# Patient Record
Sex: Male | Born: 1940 | Race: White | Hispanic: No | Marital: Married | Smoking: Former smoker
Health system: Southern US, Community
[De-identification: ages and names within clinical notes are randomized; demographics above are authoritative.]

## PROBLEM LIST (undated history)

## (undated) DIAGNOSIS — S46819A Strain of other muscles, fascia and tendons at shoulder and upper arm level, unspecified arm, initial encounter: Secondary | ICD-10-CM

## (undated) DIAGNOSIS — I10 Essential (primary) hypertension: Secondary | ICD-10-CM

## (undated) DIAGNOSIS — I484 Atypical atrial flutter: Secondary | ICD-10-CM

## (undated) DIAGNOSIS — G479 Sleep disorder, unspecified: Secondary | ICD-10-CM

## (undated) DIAGNOSIS — M5440 Lumbago with sciatica, unspecified side: Secondary | ICD-10-CM

## (undated) DIAGNOSIS — I493 Ventricular premature depolarization: Secondary | ICD-10-CM

## (undated) DIAGNOSIS — K219 Gastro-esophageal reflux disease without esophagitis: Secondary | ICD-10-CM

## (undated) DIAGNOSIS — F32A Depression, unspecified: Secondary | ICD-10-CM

## (undated) DIAGNOSIS — I214 Non-ST elevation (NSTEMI) myocardial infarction: Secondary | ICD-10-CM

## (undated) DIAGNOSIS — R195 Other fecal abnormalities: Secondary | ICD-10-CM

## (undated) DIAGNOSIS — D122 Benign neoplasm of ascending colon: Secondary | ICD-10-CM

## (undated) DIAGNOSIS — E785 Hyperlipidemia, unspecified: Secondary | ICD-10-CM

## (undated) DIAGNOSIS — K22719 Barrett's esophagus with dysplasia, unspecified: Secondary | ICD-10-CM

## (undated) DIAGNOSIS — I2510 Atherosclerotic heart disease of native coronary artery without angina pectoris: Secondary | ICD-10-CM

## (undated) DIAGNOSIS — R0600 Dyspnea, unspecified: Secondary | ICD-10-CM

## (undated) DIAGNOSIS — M5416 Radiculopathy, lumbar region: Secondary | ICD-10-CM

## (undated) DIAGNOSIS — F419 Anxiety disorder, unspecified: Secondary | ICD-10-CM

## (undated) DIAGNOSIS — F329 Major depressive disorder, single episode, unspecified: Secondary | ICD-10-CM

## (undated) DIAGNOSIS — M1990 Unspecified osteoarthritis, unspecified site: Secondary | ICD-10-CM

## (undated) DIAGNOSIS — K921 Melena: Secondary | ICD-10-CM

## (undated) DIAGNOSIS — E039 Hypothyroidism, unspecified: Secondary | ICD-10-CM

## (undated) DIAGNOSIS — I259 Chronic ischemic heart disease, unspecified: Secondary | ICD-10-CM

## (undated) DIAGNOSIS — R072 Precordial pain: Secondary | ICD-10-CM

## (undated) DIAGNOSIS — R0683 Snoring: Secondary | ICD-10-CM

## (undated) DIAGNOSIS — S68114A Complete traumatic metacarpophalangeal amputation of right ring finger, initial encounter: Secondary | ICD-10-CM

## (undated) DIAGNOSIS — I219 Acute myocardial infarction, unspecified: Secondary | ICD-10-CM

## (undated) DIAGNOSIS — I471 Supraventricular tachycardia: Secondary | ICD-10-CM

## (undated) DIAGNOSIS — R001 Bradycardia, unspecified: Secondary | ICD-10-CM

## (undated) DIAGNOSIS — G8929 Other chronic pain: Secondary | ICD-10-CM

## (undated) DIAGNOSIS — I4510 Unspecified right bundle-branch block: Secondary | ICD-10-CM

## (undated) DIAGNOSIS — N400 Enlarged prostate without lower urinary tract symptoms: Secondary | ICD-10-CM

## (undated) DIAGNOSIS — M79672 Pain in left foot: Secondary | ICD-10-CM

## (undated) DIAGNOSIS — R208 Other disturbances of skin sensation: Secondary | ICD-10-CM

## (undated) DIAGNOSIS — D62 Acute posthemorrhagic anemia: Secondary | ICD-10-CM

## (undated) DIAGNOSIS — K5521 Angiodysplasia of colon with hemorrhage: Secondary | ICD-10-CM

## (undated) DIAGNOSIS — M79671 Pain in right foot: Secondary | ICD-10-CM

## (undated) HISTORY — DX: Pain in left foot: M79.672

## (undated) HISTORY — PX: SHOULDER OPEN ROTATOR CUFF REPAIR: SHX2407

## (undated) HISTORY — DX: Sleep disorder, unspecified: G47.9

## (undated) HISTORY — DX: Other fecal abnormalities: R19.5

## (undated) HISTORY — DX: Angiodysplasia of colon with hemorrhage: K55.21

## (undated) HISTORY — DX: Essential (primary) hypertension: I10

## (undated) HISTORY — DX: Non-ST elevation (NSTEMI) myocardial infarction: I21.4

## (undated) HISTORY — DX: Radiculopathy, lumbar region: M54.16

## (undated) HISTORY — DX: Supraventricular tachycardia: I47.1

## (undated) HISTORY — DX: Atypical atrial flutter: I48.4

## (undated) HISTORY — DX: Chronic ischemic heart disease, unspecified: I25.9

## (undated) HISTORY — PX: EYE SURGERY: SHX253

## (undated) HISTORY — DX: Atherosclerotic heart disease of native coronary artery without angina pectoris: I25.10

## (undated) HISTORY — DX: Snoring: R06.83

## (undated) HISTORY — PX: FRACTURE SURGERY: SHX138

## (undated) HISTORY — DX: Lumbago with sciatica, unspecified side: M54.40

## (undated) HISTORY — DX: Unspecified right bundle-branch block: I45.10

## (undated) HISTORY — DX: Strain of other muscles, fascia and tendons at shoulder and upper arm level, unspecified arm, initial encounter: S46.819A

## (undated) HISTORY — DX: Precordial pain: R07.2

## (undated) HISTORY — DX: Other disturbances of skin sensation: R20.8

## (undated) HISTORY — DX: Barrett's esophagus with dysplasia, unspecified: K22.719

## (undated) HISTORY — DX: Benign neoplasm of ascending colon: D12.2

## (undated) HISTORY — PX: CORONARY ANGIOPLASTY: SHX604

## (undated) HISTORY — DX: Ventricular premature depolarization: I49.3

## (undated) HISTORY — DX: Complete traumatic metacarpophalangeal amputation of right ring finger, initial encounter: S68.114A

## (undated) HISTORY — DX: Pain in right foot: M79.671

## (undated) HISTORY — PX: OTHER SURGICAL HISTORY: SHX169

## (undated) HISTORY — DX: Gastro-esophageal reflux disease without esophagitis: K21.9

## (undated) HISTORY — DX: Bradycardia, unspecified: R00.1

## (undated) HISTORY — DX: Melena: K92.1

## (undated) HISTORY — DX: Other chronic pain: G89.29

## (undated) HISTORY — DX: Acute posthemorrhagic anemia: D62

---

## 1998-04-22 HISTORY — PX: KNEE ARTHROSCOPY W/ MENISCAL REPAIR: SHX1877

## 2001-04-22 DIAGNOSIS — I2510 Atherosclerotic heart disease of native coronary artery without angina pectoris: Secondary | ICD-10-CM | POA: Insufficient documentation

## 2001-04-22 DIAGNOSIS — I219 Acute myocardial infarction, unspecified: Secondary | ICD-10-CM | POA: Insufficient documentation

## 2001-04-22 HISTORY — DX: Acute myocardial infarction, unspecified: I21.9

## 2001-04-22 HISTORY — DX: Atherosclerotic heart disease of native coronary artery without angina pectoris: I25.10

## 2008-03-31 ENCOUNTER — Encounter: Admission: RE | Admit: 2008-03-31 | Discharge: 2008-03-31 | Payer: Self-pay | Admitting: Orthopedic Surgery

## 2009-04-22 HISTORY — PX: LUMBAR EPIDURAL INJECTION: SHX1980

## 2010-04-22 DIAGNOSIS — N400 Enlarged prostate without lower urinary tract symptoms: Secondary | ICD-10-CM | POA: Insufficient documentation

## 2010-04-22 HISTORY — DX: Benign prostatic hyperplasia without lower urinary tract symptoms: N40.0

## 2010-10-21 HISTORY — PX: CARDIAC CATHETERIZATION: SHX172

## 2011-03-15 ENCOUNTER — Encounter (HOSPITAL_COMMUNITY): Payer: Self-pay | Admitting: Pharmacy Technician

## 2011-03-21 ENCOUNTER — Encounter (HOSPITAL_COMMUNITY): Payer: Self-pay

## 2011-03-21 ENCOUNTER — Encounter (HOSPITAL_COMMUNITY)
Admission: RE | Admit: 2011-03-21 | Discharge: 2011-03-21 | Disposition: A | Discharge status: Home / Self Care | Payer: Medicare Other | Source: Ambulatory Visit | Attending: Orthopedic Surgery | Admitting: Orthopedic Surgery

## 2011-03-21 HISTORY — DX: Acute myocardial infarction, unspecified: I21.9

## 2011-03-21 HISTORY — DX: Gastro-esophageal reflux disease without esophagitis: K21.9

## 2011-03-21 HISTORY — DX: Essential (primary) hypertension: I10

## 2011-03-21 HISTORY — DX: Unspecified osteoarthritis, unspecified site: M19.90

## 2011-03-21 HISTORY — DX: Hyperlipidemia, unspecified: E78.5

## 2011-03-21 HISTORY — DX: Benign prostatic hyperplasia without lower urinary tract symptoms: N40.0

## 2011-03-21 HISTORY — DX: Atherosclerotic heart disease of native coronary artery without angina pectoris: I25.10

## 2011-03-21 LAB — BASIC METABOLIC PANEL
CO2: 26 mEq/L (ref 19–32)
Calcium: 9.1 mg/dL (ref 8.4–10.5)
Chloride: 106 mEq/L (ref 96–112)
Creatinine, Ser: 0.81 mg/dL (ref 0.50–1.35)
Glucose, Bld: 105 mg/dL — ABNORMAL HIGH (ref 70–99)

## 2011-03-21 LAB — DIFFERENTIAL
Eosinophils Absolute: 0.2 10*3/uL (ref 0.0–0.7)
Eosinophils Relative: 3 % (ref 0–5)
Lymphs Abs: 1.4 10*3/uL (ref 0.7–4.0)
Monocytes Absolute: 0.8 10*3/uL (ref 0.1–1.0)

## 2011-03-21 LAB — CBC
HCT: 40.1 % (ref 39.0–52.0)
MCH: 31.2 pg (ref 26.0–34.0)
MCV: 92.6 fL (ref 78.0–100.0)
Platelets: 261 10*3/uL (ref 150–400)
RBC: 4.33 MIL/uL (ref 4.22–5.81)
RDW: 13.6 % (ref 11.5–15.5)

## 2011-03-21 LAB — PROTIME-INR: Prothrombin Time: 12.3 seconds (ref 11.6–15.2)

## 2011-03-21 NOTE — Pre-Procedure Instructions (Addendum)
[address] [place]  03/21/2011   Your procedure is scheduled on:  Friday, Dec 07  Report to [facility] at 1200 PM.  Call this number if you have problems the morning of surgery: [phone]   Remember:   Do not eat food:After Midnight.  May have clear liquids: up to 4 Hours before arrival.  Clear liquids include soda, tea, black coffee, apple or grape juice, broth.  Take these medicines the morning of surgery with A SIP OF WATER:Amlodipine, Nexium, Isosorbide, Metoprolol,Apresoline   Do not wear jewelry, make-up or nail polish.  Do not wear lotions, powders, or perfumes. You may wear deodorant.  Do not shave 48 hours prior to surgery.  Do not bring valuables to the hospital.  Contacts, dentures or bridgework may not be worn into surgery.  Leave suitcase in the car. After surgery it may be brought to your room.  For patients admitted to the hospital, checkout time is 11:00 AM the day of discharge.   Patients discharged the day of surgery will not be allowed to drive home.  Name and phone number of your driver:n/a Special Instructions: CHG Shower Use Special Wash: 1/2 bottle night before surgery and 1/2 bottle morning of surgery.   Please read over the following fact sheets that you were given: Pain Booklet, Coughing and Deep Breathing, MRSA Information and Surgical Site Infection Prevention

## 2011-03-21 NOTE — Progress Notes (Signed)
Records requested from Dr [facility] @ [facility], [facility] [phone])

## 2011-03-21 NOTE — H&P (Signed)
CC: Right shoulder pain HPI:   70 y/o male with worsening shoulder pain with known rotator cuff issues.   PMH:Htn, cardiac stents hyperlipidemia Family History: CAD Social: pcp in [place], no smoking or drinking works as a Visual merchandiser Meds:plavix, ramipril, lasix, metoprolol, nexium, potassium, amlodipine, sotalol, hydralazine, aspirin, isosorbide mononitate Allergies: norco ROS: Pain with rom of right shoulder  Vitals: PE: Alert and appropriate    In no acute distress Cranial 2-12 grossly intact Cervical spine full rom without pain Lungs: bilateral breath sounds with no wheeze rhonchi or rales Heart: regular rate and rhythm with no murmur Abd: nontender nondistended with active bowel sounds Ext: moderate pain with rom of  Right shoulder worrisome for subscap tear, neurovascularly intact distally. No pedal edema Skin: no rashes  A/P: suspected subscap tear of right shoulder Plan for shoulder arthroscopy and open subscap repair

## 2011-03-28 MED ORDER — CEFAZOLIN SODIUM-DEXTROSE 2-3 GM-% IV SOLR
2.0000 g | INTRAVENOUS | Status: DC
  Filled 2011-03-28: qty 50

## 2011-03-29 ENCOUNTER — Encounter (HOSPITAL_COMMUNITY)
Admission: RE | Disposition: A | Discharge status: Home / Self Care | Payer: Self-pay | Source: Ambulatory Visit | Attending: Orthopedic Surgery

## 2011-03-29 ENCOUNTER — Encounter (HOSPITAL_COMMUNITY): Payer: Self-pay | Admitting: Surgery

## 2011-03-29 ENCOUNTER — Encounter (HOSPITAL_COMMUNITY): Payer: Self-pay | Admitting: Anesthesiology

## 2011-03-29 ENCOUNTER — Observation Stay (HOSPITAL_COMMUNITY)
Admission: RE | Admit: 2011-03-29 | Discharge: 2011-03-31 | Disposition: A | Discharge status: Home / Self Care | Payer: Medicare Other | Source: Ambulatory Visit | Attending: Orthopedic Surgery | Admitting: Orthopedic Surgery

## 2011-03-29 ENCOUNTER — Ambulatory Visit (HOSPITAL_COMMUNITY): Payer: Medicare Other | Admitting: Anesthesiology

## 2011-03-29 DIAGNOSIS — S46819A Strain of other muscles, fascia and tendons at shoulder and upper arm level, unspecified arm, initial encounter: Secondary | ICD-10-CM

## 2011-03-29 DIAGNOSIS — M719 Bursopathy, unspecified: Principal | ICD-10-CM | POA: Insufficient documentation

## 2011-03-29 DIAGNOSIS — M67919 Unspecified disorder of synovium and tendon, unspecified shoulder: Principal | ICD-10-CM | POA: Insufficient documentation

## 2011-03-29 DIAGNOSIS — Z01812 Encounter for preprocedural laboratory examination: Secondary | ICD-10-CM | POA: Insufficient documentation

## 2011-03-29 DIAGNOSIS — Z01811 Encounter for preprocedural respiratory examination: Secondary | ICD-10-CM | POA: Insufficient documentation

## 2011-03-29 DIAGNOSIS — Z5333 Arthroscopic surgical procedure converted to open procedure: Secondary | ICD-10-CM | POA: Insufficient documentation

## 2011-03-29 HISTORY — DX: Strain of other muscles, fascia and tendons at shoulder and upper arm level, unspecified arm, initial encounter: S46.819A

## 2011-03-29 HISTORY — PX: SHOULDER ARTHROSCOPY: SHX128

## 2011-03-29 SURGERY — ARTHROSCOPY, SHOULDER
Anesthesia: General | Site: Shoulder | Laterality: Right | Wound class: Clean

## 2011-03-29 MED ORDER — ISOSORBIDE MONONITRATE ER 60 MG PO TB24
60.0000 mg | ORAL_TABLET | Freq: Every day | ORAL | Status: DC
  Administered 2011-03-30 – 2011-03-31 (×2): 60 mg via ORAL
  Filled 2011-03-29 (×2): qty 1

## 2011-03-29 MED ORDER — DUTASTERIDE 0.5 MG PO CAPS
0.5000 mg | ORAL_CAPSULE | Freq: Every day | ORAL | Status: DC
  Administered 2011-03-30 – 2011-03-31 (×2): 0.5 mg via ORAL
  Filled 2011-03-29 (×2): qty 1

## 2011-03-29 MED ORDER — HYDRALAZINE HCL 25 MG PO TABS
25.0000 mg | ORAL_TABLET | Freq: Two times a day (BID) | ORAL | Status: DC
  Administered 2011-03-30 – 2011-03-31 (×2): 25 mg via ORAL
  Filled 2011-03-29 (×5): qty 1

## 2011-03-29 MED ORDER — LORAZEPAM 2 MG/ML IJ SOLN: 1.0000 mg | Freq: Once | INTRAMUSCULAR | Status: DC | PRN

## 2011-03-29 MED ORDER — SODIUM CHLORIDE 0.9 % IV SOLN: INTRAVENOUS | Status: DC

## 2011-03-29 MED ORDER — LACTATED RINGERS IV SOLN
INTRAVENOUS | Status: DC | PRN
  Administered 2011-03-29 (×2): via INTRAVENOUS

## 2011-03-29 MED ORDER — BUPIVACAINE-EPINEPHRINE 0.25% -1:200000 IJ SOLN
INTRAMUSCULAR | Status: DC | PRN
  Administered 2011-03-29: 8 mL

## 2011-03-29 MED ORDER — CEFAZOLIN SODIUM-DEXTROSE 2-3 GM-% IV SOLR
2.0000 g | Freq: Four times a day (QID) | INTRAVENOUS | Status: AC
  Administered 2011-03-29 – 2011-03-30 (×3): 2 g via INTRAVENOUS
  Filled 2011-03-29 (×3): qty 50

## 2011-03-29 MED ORDER — METOCLOPRAMIDE HCL 5 MG/ML IJ SOLN
5.0000 mg | Freq: Three times a day (TID) | INTRAMUSCULAR | Status: DC | PRN
  Filled 2011-03-29: qty 2

## 2011-03-29 MED ORDER — HYDROMORPHONE HCL PF 1 MG/ML IJ SOLN
INTRAMUSCULAR | Status: AC
  Administered 2011-03-29: 1 mg via INTRAVENOUS
  Filled 2011-03-29: qty 1

## 2011-03-29 MED ORDER — FENTANYL CITRATE 0.05 MG/ML IJ SOLN
50.0000 ug | INTRAMUSCULAR | Status: DC | PRN
  Administered 2011-03-29: 100 ug via INTRAVENOUS

## 2011-03-29 MED ORDER — FLAX SEED OIL 1000 MG PO CAPS: 1.0000 | ORAL_CAPSULE | Freq: Every day | ORAL | Status: DC

## 2011-03-29 MED ORDER — FENTANYL CITRATE 0.05 MG/ML IJ SOLN
INTRAMUSCULAR | Status: DC | PRN
  Administered 2011-03-29: 50 ug via INTRAVENOUS

## 2011-03-29 MED ORDER — TAMSULOSIN HCL 0.4 MG PO CAPS
0.4000 mg | ORAL_CAPSULE | Freq: Every day | ORAL | Status: DC
  Administered 2011-03-30 – 2011-03-31 (×2): 0.4 mg via ORAL
  Filled 2011-03-29 (×3): qty 1

## 2011-03-29 MED ORDER — NEOSTIGMINE METHYLSULFATE 1 MG/ML IJ SOLN
INTRAMUSCULAR | Status: DC | PRN
  Administered 2011-03-29: 2 mg via INTRAVENOUS

## 2011-03-29 MED ORDER — MIDAZOLAM HCL 2 MG/2ML IJ SOLN
INTRAMUSCULAR | Status: AC
  Filled 2011-03-29: qty 2

## 2011-03-29 MED ORDER — PROPOFOL 10 MG/ML IV EMUL
INTRAVENOUS | Status: DC | PRN
  Administered 2011-03-29: 170 mg via INTRAVENOUS

## 2011-03-29 MED ORDER — METHOCARBAMOL 100 MG/ML IJ SOLN
500.0000 mg | Freq: Four times a day (QID) | INTRAVENOUS | Status: DC | PRN
  Filled 2011-03-29: qty 5

## 2011-03-29 MED ORDER — ONDANSETRON HCL 4 MG PO TABS: 4.0000 mg | ORAL_TABLET | Freq: Four times a day (QID) | ORAL | Status: DC | PRN

## 2011-03-29 MED ORDER — PHENOL 1.4 % MT LIQD
1.0000 | OROMUCOSAL | Status: DC | PRN
  Filled 2011-03-29: qty 177

## 2011-03-29 MED ORDER — AMLODIPINE BESYLATE 5 MG PO TABS
5.0000 mg | ORAL_TABLET | Freq: Two times a day (BID) | ORAL | Status: DC
  Administered 2011-03-29 – 2011-03-31 (×4): 5 mg via ORAL
  Filled 2011-03-29 (×5): qty 1

## 2011-03-29 MED ORDER — CHLORHEXIDINE GLUCONATE 4 % EX LIQD: 60.0000 mL | Freq: Once | CUTANEOUS | Status: DC

## 2011-03-29 MED ORDER — PROMETHAZINE HCL 25 MG/ML IJ SOLN: 6.2500 mg | INTRAMUSCULAR | Status: DC | PRN

## 2011-03-29 MED ORDER — SODIUM CHLORIDE 0.9 % IR SOLN
Status: DC | PRN
  Administered 2011-03-29: 1000 mL

## 2011-03-29 MED ORDER — PHENYLEPHRINE HCL 10 MG/ML IJ SOLN
INTRAMUSCULAR | Status: DC | PRN
  Administered 2011-03-29 (×3): 80 ug via INTRAVENOUS

## 2011-03-29 MED ORDER — HYDROMORPHONE HCL PF 1 MG/ML IJ SOLN
0.5000 mg | INTRAMUSCULAR | Status: DC | PRN
  Administered 2011-03-29 – 2011-03-30 (×4): 1 mg via INTRAVENOUS
  Filled 2011-03-29 (×4): qty 1

## 2011-03-29 MED ORDER — CLOPIDOGREL BISULFATE 75 MG PO TABS
75.0000 mg | ORAL_TABLET | Freq: Every day | ORAL | Status: DC
  Administered 2011-03-30 – 2011-03-31 (×2): 75 mg via ORAL
  Filled 2011-03-29 (×3): qty 1

## 2011-03-29 MED ORDER — ROSUVASTATIN CALCIUM 10 MG PO TABS
10.0000 mg | ORAL_TABLET | Freq: Every day | ORAL | Status: DC
  Administered 2011-03-29 – 2011-03-30 (×2): 10 mg via ORAL
  Filled 2011-03-29 (×3): qty 1

## 2011-03-29 MED ORDER — BUPIVACAINE-EPINEPHRINE PF 0.5-1:200000 % IJ SOLN
INTRAMUSCULAR | Status: DC | PRN
  Administered 2011-03-29: 25 mL

## 2011-03-29 MED ORDER — RAMIPRIL 5 MG PO CAPS
5.0000 mg | ORAL_CAPSULE | Freq: Every day | ORAL | Status: DC
  Administered 2011-03-30 – 2011-03-31 (×2): 5 mg via ORAL
  Filled 2011-03-29 (×2): qty 1

## 2011-03-29 MED ORDER — METOCLOPRAMIDE HCL 10 MG PO TABS: 5.0000 mg | ORAL_TABLET | Freq: Three times a day (TID) | ORAL | Status: DC | PRN

## 2011-03-29 MED ORDER — FIBER (GUAR GUM) PO CHEW: 1.0000 | CHEWABLE_TABLET | Freq: Every day | ORAL | Status: DC

## 2011-03-29 MED ORDER — POTASSIUM CHLORIDE CRYS ER 20 MEQ PO TBCR
20.0000 meq | EXTENDED_RELEASE_TABLET | Freq: Every day | ORAL | Status: DC
  Administered 2011-03-30 – 2011-03-31 (×2): 20 meq via ORAL
  Filled 2011-03-29 (×2): qty 1

## 2011-03-29 MED ORDER — HYDROMORPHONE HCL PF 1 MG/ML IJ SOLN
0.2500 mg | INTRAMUSCULAR | Status: DC | PRN
  Administered 2011-03-29 (×3): 0.5 mg via INTRAVENOUS

## 2011-03-29 MED ORDER — OMEGA-3-ACID ETHYL ESTERS 1 G PO CAPS
1.0000 g | ORAL_CAPSULE | Freq: Every day | ORAL | Status: DC
  Administered 2011-03-30 – 2011-03-31 (×2): 1 g via ORAL
  Filled 2011-03-29 (×2): qty 1

## 2011-03-29 MED ORDER — GLUCOSAMINE-CHONDROITIN PO TABS: 1.0000 | ORAL_TABLET | Freq: Every day | ORAL | Status: DC

## 2011-03-29 MED ORDER — ACETAMINOPHEN 325 MG PO TABS: 650.0000 mg | ORAL_TABLET | Freq: Four times a day (QID) | ORAL | Status: DC | PRN

## 2011-03-29 MED ORDER — ASPIRIN EC 81 MG PO TBEC
81.0000 mg | DELAYED_RELEASE_TABLET | Freq: Every day | ORAL | Status: DC
  Administered 2011-03-30 – 2011-03-31 (×2): 81 mg via ORAL
  Filled 2011-03-29 (×3): qty 1

## 2011-03-29 MED ORDER — LIDOCAINE HCL (CARDIAC) 20 MG/ML IV SOLN
INTRAVENOUS | Status: DC | PRN
  Administered 2011-03-29: 40 mg via INTRAVENOUS

## 2011-03-29 MED ORDER — PANTOPRAZOLE SODIUM 40 MG PO TBEC
40.0000 mg | DELAYED_RELEASE_TABLET | Freq: Every day | ORAL | Status: DC
  Administered 2011-03-30 – 2011-03-31 (×2): 40 mg via ORAL
  Filled 2011-03-29 (×2): qty 1

## 2011-03-29 MED ORDER — SOTALOL HCL 80 MG PO TABS
40.0000 mg | ORAL_TABLET | Freq: Two times a day (BID) | ORAL | Status: DC
  Administered 2011-03-29 – 2011-03-31 (×4): 40 mg via ORAL
  Filled 2011-03-29 (×5): qty 0.5

## 2011-03-29 MED ORDER — ONDANSETRON HCL 4 MG/2ML IJ SOLN
INTRAMUSCULAR | Status: DC | PRN
  Administered 2011-03-29: 4 mg via INTRAVENOUS

## 2011-03-29 MED ORDER — FUROSEMIDE 40 MG PO TABS
40.0000 mg | ORAL_TABLET | Freq: Every day | ORAL | Status: DC
  Administered 2011-03-31: 40 mg via ORAL
  Filled 2011-03-29 (×2): qty 1

## 2011-03-29 MED ORDER — METOPROLOL TARTRATE 50 MG PO TABS
50.0000 mg | ORAL_TABLET | Freq: Two times a day (BID) | ORAL | Status: DC
  Administered 2011-03-29 – 2011-03-31 (×4): 50 mg via ORAL
  Filled 2011-03-29 (×5): qty 1

## 2011-03-29 MED ORDER — ACETAMINOPHEN 650 MG RE SUPP: 650.0000 mg | Freq: Four times a day (QID) | RECTAL | Status: DC | PRN

## 2011-03-29 MED ORDER — ROCURONIUM BROMIDE 100 MG/10ML IV SOLN
INTRAVENOUS | Status: DC | PRN
  Administered 2011-03-29: 50 mg via INTRAVENOUS

## 2011-03-29 MED ORDER — EPHEDRINE SULFATE 50 MG/ML IJ SOLN
INTRAMUSCULAR | Status: DC | PRN
  Administered 2011-03-29 (×2): 10 mg via INTRAVENOUS

## 2011-03-29 MED ORDER — SODIUM CHLORIDE 0.9 % IV SOLN
INTRAVENOUS | Status: DC
  Administered 2011-03-29: 19:00:00 via INTRAVENOUS

## 2011-03-29 MED ORDER — DUTASTERIDE-TAMSULOSIN HCL 0.5-0.4 MG PO CAPS: 1.0000 | ORAL_CAPSULE | Freq: Every day | ORAL | Status: DC

## 2011-03-29 MED ORDER — METHOCARBAMOL 500 MG PO TABS
500.0000 mg | ORAL_TABLET | Freq: Four times a day (QID) | ORAL | Status: DC | PRN
  Administered 2011-03-29 – 2011-03-30 (×3): 500 mg via ORAL
  Filled 2011-03-29 (×3): qty 1

## 2011-03-29 MED ORDER — OXYCODONE-ACETAMINOPHEN 5-325 MG PO TABS
1.0000 | ORAL_TABLET | ORAL | Status: DC | PRN
  Administered 2011-03-30 (×4): 2 via ORAL
  Filled 2011-03-29 (×4): qty 2

## 2011-03-29 MED ORDER — MENTHOL 3 MG MT LOZG
1.0000 | LOZENGE | OROMUCOSAL | Status: DC | PRN
  Filled 2011-03-29: qty 9

## 2011-03-29 MED ORDER — VITAMIN E 45 MG (100 UNIT) PO CAPS
100.0000 [IU] | ORAL_CAPSULE | Freq: Every day | ORAL | Status: DC
Start: 2011-03-30 — End: 2011-03-31
  Administered 2011-03-30 – 2011-03-31 (×2): 100 [IU] via ORAL
  Filled 2011-03-29 (×2): qty 1

## 2011-03-29 MED ORDER — SODIUM CHLORIDE 0.9 % IV SOLN
10.0000 mg | INTRAVENOUS | Status: DC | PRN
  Administered 2011-03-29: 10 ug/min via INTRAVENOUS

## 2011-03-29 MED ORDER — ONDANSETRON HCL 4 MG/2ML IJ SOLN: 4.0000 mg | Freq: Four times a day (QID) | INTRAMUSCULAR | Status: DC | PRN

## 2011-03-29 MED ORDER — THERA M PLUS PO TABS
1.0000 | ORAL_TABLET | Freq: Every day | ORAL | Status: DC
  Administered 2011-03-30 – 2011-03-31 (×2): 1 via ORAL
  Filled 2011-03-29 (×2): qty 1

## 2011-03-29 MED ORDER — FENTANYL CITRATE 0.05 MG/ML IJ SOLN
INTRAMUSCULAR | Status: AC
  Filled 2011-03-29: qty 2

## 2011-03-29 MED ORDER — GLYCOPYRROLATE 0.2 MG/ML IJ SOLN
INTRAMUSCULAR | Status: DC | PRN
  Administered 2011-03-29: .4 mg via INTRAVENOUS

## 2011-03-29 MED ORDER — MIDAZOLAM HCL 2 MG/2ML IJ SOLN
1.0000 mg | INTRAMUSCULAR | Status: DC | PRN
  Administered 2011-03-29: 2 mg via INTRAVENOUS

## 2011-03-29 MED ORDER — LACTATED RINGERS IV SOLN
INTRAVENOUS | Status: DC
  Administered 2011-03-29: 12:00:00 via INTRAVENOUS

## 2011-03-29 SURGICAL SUPPLY — 45 items
3.5mm poplok suture anchor (Orthopedic Implant) ×2 IMPLANT
4.5mm poplok suture anchor ×2 IMPLANT
ANCHOR SUT CROSSFT 4.5 #2 (Anchor) ×4 IMPLANT
BLADE CUDA 4.2 (BLADE) IMPLANT
BLADE SURG 11 STRL SS (BLADE) ×2 IMPLANT
BUR OVAL 4.0 (BURR) ×2 IMPLANT
CLOTH BEACON ORANGE TIMEOUT ST (SAFETY) ×2 IMPLANT
COVER SURGICAL LIGHT HANDLE (MISCELLANEOUS) ×2 IMPLANT
DRAPE INCISE IOBAN 66X45 STRL (DRAPES) ×2 IMPLANT
DRAPE STERI 35X30 U-POUCH (DRAPES) ×2 IMPLANT
DRAPE U-SHAPE 47X51 STRL (DRAPES) ×2 IMPLANT
DRSG EMULSION OIL 3X3 NADH (GAUZE/BANDAGES/DRESSINGS) ×4 IMPLANT
DRSG PAD ABDOMINAL 8X10 ST (GAUZE/BANDAGES/DRESSINGS) ×4 IMPLANT
DURAPREP 26ML APPLICATOR (WOUND CARE) ×2 IMPLANT
ELECT REM PT RETURN 9FT ADLT (ELECTROSURGICAL)
ELECTRODE REM PT RTRN 9FT ADLT (ELECTROSURGICAL) IMPLANT
GLOVE BIOGEL PI ORTHO PRO 7.5 (GLOVE) ×1
GLOVE BIOGEL PI ORTHO PRO SZ8 (GLOVE) ×1
GLOVE ORTHO TXT STRL SZ7.5 (GLOVE) ×2 IMPLANT
GLOVE PI ORTHO PRO STRL 7.5 (GLOVE) ×1 IMPLANT
GLOVE PI ORTHO PRO STRL SZ8 (GLOVE) ×1 IMPLANT
GLOVE SURG ORTHO 8.5 STRL (GLOVE) ×2 IMPLANT
GOWN STRL NON-REIN LRG LVL3 (GOWN DISPOSABLE) ×4 IMPLANT
KIT BASIN OR (CUSTOM PROCEDURE TRAY) ×2 IMPLANT
KIT ROOM TURNOVER OR (KITS) ×2 IMPLANT
MANIFOLD NEPTUNE II (INSTRUMENTS) ×2 IMPLANT
NEEDLE HYPO 25GX1X1/2 BEV (NEEDLE) ×2 IMPLANT
NEEDLE SPNL 18GX3.5 QUINCKE PK (NEEDLE) ×2 IMPLANT
NS IRRIG 1000ML POUR BTL (IV SOLUTION) ×2 IMPLANT
PACK SHOULDER (CUSTOM PROCEDURE TRAY) ×2 IMPLANT
PAD ARMBOARD 7.5X6 YLW CONV (MISCELLANEOUS) ×4 IMPLANT
RESECTOR FULL RADIUS 4.2MM (BLADE) ×4 IMPLANT
SET ARTHROSCOPY TUBING (MISCELLANEOUS) ×1
SET ARTHROSCOPY TUBING LN (MISCELLANEOUS) ×1 IMPLANT
SLING ARM FOAM STRAP LRG (SOFTGOODS) ×2 IMPLANT
SLING ARM FOAM STRAP MED (SOFTGOODS) IMPLANT
SPONGE GAUZE 4X4 12PLY (GAUZE/BANDAGES/DRESSINGS) ×2 IMPLANT
STRIP CLOSURE SKIN 1/2X4 (GAUZE/BANDAGES/DRESSINGS) ×2 IMPLANT
SUT MNCRL AB 3-0 PS2 27 (SUTURE) ×2 IMPLANT
SYR CONTROL 10ML LL (SYRINGE) ×2 IMPLANT
TOWEL OR 17X24 6PK STRL BLUE (TOWEL DISPOSABLE) ×2 IMPLANT
TOWEL OR 17X26 10 PK STRL BLUE (TOWEL DISPOSABLE) ×2 IMPLANT
TUBE CONNECTING 12X1/4 (SUCTIONS) ×2 IMPLANT
WAND 90 DEG TURBOVAC W/CORD (SURGICAL WAND) ×2 IMPLANT
WATER STERILE IRR 1000ML POUR (IV SOLUTION) ×2 IMPLANT

## 2011-03-29 NOTE — Anesthesia Postprocedure Evaluation (Signed)
  Anesthesia Post-op Note  Patient: Andrew Hardy  Procedure(s) Performed:  ARTHROSCOPY SHOULDER - RIGHT SHOULDER ARTHROSCOPY WITH OPEN SUBSCAPULAR REPAIR  Patient Location: PACU  Anesthesia Type: GA combined with regional for post-op pain  Level of Consciousness: awake, alert  and oriented  Airway and Oxygen Therapy: Patient Spontanous Breathing  Post-op Pain: none  Post-op Assessment: Post-op Vital signs reviewed, Patient's Cardiovascular Status Stable, Respiratory Function Stable, Patent Airway and No signs of Nausea or vomiting  Post-op Vital Signs: stable  Complications: No apparent anesthesia complications

## 2011-03-29 NOTE — Interval H&P Note (Signed)
History and Physical Interval Note:  03/29/2011 12:44 PM  Andrew Hardy  has presented today for surgery, with the diagnosis of RIGHT SHOULDER SUBSCAPULAR TEAR  The various methods of treatment have been discussed with the patient and family. After consideration of risks, benefits and other options for treatment, the patient has consented to  Procedure(s): ARTHROSCOPY SHOULDER as a surgical intervention .  The patients' history has been reviewed, patient examined, no change in status, stable for surgery.  I have reviewed the patients' chart and labs.  Questions were answered to the patient's satisfaction.     Dashia Caldeira,STEVEN R

## 2011-03-29 NOTE — Anesthesia Preprocedure Evaluation (Signed)
Anesthesia Evaluation  Patient identified by MRN, date of birth, ID band Patient awake    Reviewed: Allergy & Precautions, H&P , NPO status , Patient's Chart, lab work & pertinent test results  Airway Mallampati: II TM Distance: >3 FB Neck ROM: Full    Dental   Pulmonary    Pulmonary exam normal       Cardiovascular hypertension, + CAD and + Past MI     Neuro/Psych    GI/Hepatic GERD-  ,  Endo/Other    Renal/GU      Musculoskeletal   Abdominal   Peds  Hematology   Anesthesia Other Findings   Reproductive/Obstetrics                           Anesthesia Physical Anesthesia Plan  ASA: III  Anesthesia Plan: General   Post-op Pain Management:    Induction: Intravenous  Airway Management Planned: Oral ETT  Additional Equipment:   Intra-op Plan:   Post-operative Plan: Extubation in OR  Informed Consent: I have reviewed the patients History and Physical, chart, labs and discussed the procedure including the risks, benefits and alternatives for the proposed anesthesia with the patient or authorized representative who has indicated his/her understanding and acceptance.     Plan Discussed with: CRNA and Surgeon  Anesthesia Plan Comments:         Anesthesia Quick Evaluation

## 2011-03-29 NOTE — Transfer of Care (Signed)
Immediate Anesthesia Transfer of Care Note  Patient: Andrew Hardy  Procedure(s) Performed:  ARTHROSCOPY SHOULDER - RIGHT SHOULDER ARTHROSCOPY WITH OPEN SUBSCAPULAR REPAIR  Patient Location: PACU  Anesthesia Type: General  Level of Consciousness: awake, alert  and oriented  Airway & Oxygen Therapy: Patient Spontanous Breathing and Patient connected to nasal cannula oxygen  Post-op Assessment: Report given to PACU RN and Post -op Vital signs reviewed and stable  Post vital signs: Reviewed and stable  Complications: No apparent anesthesia complications

## 2011-03-29 NOTE — Brief Op Note (Signed)
03/29/2011  2:52 PM  PATIENT:  Andrew Hardy  70 y.o. male  PRE-OPERATIVE DIAGNOSIS:  RIGHT SHOULDER SUBSCAPULAR TEAR   POST-OPERATIVE DIAGNOSIS:  right shoulder subscapular tear, chronic rotator cuff insufficiency  PROCEDURE:  Procedure(s): ARTHROSCOPY SHOULDER, extensive debridement, open subscap repair  SURGEON:  Surgeon(s): Verlee Rossetti  PHYSICIAN ASSISTANT:   ASSISTANTS: Thea Gist PA-C   ANESTHESIA:   general  EBL:  Total I/O In: 1000 [I.V.:1000] Out: -   BLOOD ADMINISTERED:none  DRAINS: none   LOCAL MEDICATIONS USED:  MARCAINE 10 CC  SPECIMEN:  No Specimen  DISPOSITION OF SPECIMEN:  N/A  COUNTS:  YES  TOURNIQUET:  * No tourniquets in log *  DICTATION: .Other Dictation: Dictation Number [id number]  PLAN OF CARE: Admit for overnight observation  PATIENT DISPOSITION:  PACU - hemodynamically stable.   Delay start of Pharmacological VTE agent (>24hrs) due to surgical blood loss or risk of bleeding:  {YES/NO/NOT APPLICABLE:20182

## 2011-03-29 NOTE — Anesthesia Procedure Notes (Signed)
Anesthesia Regional Block:  Interscalene brachial plexus block  Pre-Anesthetic Checklist: ,, timeout performed, Correct Patient, Correct Site, Correct Laterality, Correct Procedure, Correct Position, site marked, Risks and benefits discussed,  Surgical consent,  Pre-op evaluation,  At surgeon's request and post-op pain management  Laterality: Right  Prep: chloraprep       Needles:  Injection technique: Single-shot  Needle Type: Stimulator Needle - 40     Needle Length:cm 4 cm Needle Gauge: 22 and 22 G    Additional Needles:  Procedures: nerve stimulator Interscalene brachial plexus block  Nerve Stimulator or Paresthesia:  Response: 0.5 mA,   Additional Responses:   Narrative:  Start time: 03/29/2011 12:25 PM End time: 03/29/2011 12:38 PM Injection made incrementally with aspirations every 5 mL.  Performed by: Personally  Anesthesiologist: Gypsy Balsam  Additional Notes: Pop r isb Chg prep, sterile tech 22 stim needle down to .5ma No compl Dr Gypsy Balsam

## 2011-03-29 NOTE — Preoperative (Signed)
Beta Blockers   Reason not to administer Beta Blockers:Not Applicable 

## 2011-03-30 LAB — BASIC METABOLIC PANEL
CO2: 27 mEq/L (ref 19–32)
Calcium: 8.6 mg/dL (ref 8.4–10.5)
GFR calc Af Amer: 86 mL/min — ABNORMAL LOW (ref >90)
GFR calc non Af Amer: 74 mL/min — ABNORMAL LOW (ref >90)
Sodium: 137 mEq/L (ref 135–145)

## 2011-03-30 LAB — HEMOGLOBIN AND HEMATOCRIT, BLOOD: HCT: 38.4 % — ABNORMAL LOW (ref 39.0–52.0)

## 2011-03-30 MED ORDER — HYDROCORTISONE 1 % EX CREA
TOPICAL_CREAM | CUTANEOUS | Status: DC | PRN
  Filled 2011-03-30: qty 28

## 2011-03-30 MED ORDER — KETOROLAC TROMETHAMINE 15 MG/ML IJ SOLN
15.0000 mg | Freq: Three times a day (TID) | INTRAMUSCULAR | Status: DC
  Administered 2011-03-30 – 2011-03-31 (×3): 15 mg via INTRAVENOUS
  Filled 2011-03-30 (×6): qty 1

## 2011-03-30 MED ORDER — DIPHENHYDRAMINE HCL 25 MG PO CAPS
25.0000 mg | ORAL_CAPSULE | Freq: Four times a day (QID) | ORAL | Status: DC | PRN
  Administered 2011-03-30 (×2): 25 mg via ORAL
  Filled 2011-03-30 (×2): qty 1

## 2011-03-30 MED ORDER — HYDROMORPHONE HCL 2 MG PO TABS
2.0000 mg | ORAL_TABLET | ORAL | Status: DC | PRN
  Administered 2011-03-30 – 2011-03-31 (×3): 2 mg via ORAL
  Filled 2011-03-30 (×3): qty 1

## 2011-03-30 MED ORDER — DIPHENHYDRAMINE HCL 25 MG PO CAPS
50.0000 mg | ORAL_CAPSULE | Freq: Four times a day (QID) | ORAL | Status: DC | PRN
  Administered 2011-03-30 – 2011-03-31 (×2): 50 mg via ORAL
  Filled 2011-03-30 (×2): qty 2

## 2011-03-30 NOTE — Op Note (Signed)
NAMEMarland Kitchen  Andrew Hardy, Andrew Hardy                   ACCOUNT NO.:  [id number]  MEDICAL RECORD NO.:  [id number]  LOCATION:  5018                         FACILITY:  [facility]  PHYSICIAN:  Almedia Balls. Ranell Patrick, M.D. DATE OF BIRTH:  Nov 16, 1940  DATE OF PROCEDURE:  03/29/2011 DATE OF DISCHARGE:                              OPERATIVE REPORT   PREOPERATIVE DIAGNOSIS:  Right shoulder chronic rotator cuff insufficiency with acute on chronic subscapularis tear.  POSTOPERATIVE DIAGNOSIS:  Right shoulder chronic rotator cuff insufficiency with acute on chronic subscapularis tear.  PROCEDURE PERFORMED:  Right shoulder arthroscopy with extensive debridement and open subscapularis repair.  ATTENDING SURGEON:  Almedia Balls. Ranell Patrick, MD  ASSISTANT:  Donnie Coffin. Dixon, PA-C, who scrubbed for the entirety of the procedure and necessary for proper completion of surgery and adequate visualization.  ANESTHESIA:  General anesthesia plus interscalene block was used.  ESTIMATED BLOOD LOSS:  Less than 50 mL.  FLUID REPLACEMENT:  1500 mL of crystalloid.  INSTRUMENT COUNTS:  Correct.  There were no complications. Perioperative antibiotics were given.  INDICATIONS:  The patient is a 70 year old male with worsening right shoulder pain following a recent injury to his shoulder.  The patient has longstanding rotator cuff insufficiency.  He has been managed with periodic injections of corticosteroid.  The patient has done well until recently when he felt a pop in his shoulder with immediate onset of severe pain in his shoulder and difficulty elevating his arm.  The patient was felt to have sustained a subscapularis tear based on MRI findings and clinical findings.  The patient presents now having been fully counseled regarding surgical and nonsurgical options for the shoulder.  Risks and benefits were discussed.  The patient wanted to proceed with surgery to restore function and eliminate pain to his shoulder.  DESCRIPTION OF  PROCEDURE:  After adequate level of anesthesia was achieved, the patient was positioned in the modified beach-chair position.  His shoulder was examined under anesthesia.  He had crepitance with passive range of motion.  He had external rotation about 90 degrees, concerning for subscapularis insufficiency.  We then sterilely prepped and draped the right shoulder.  Time-out was called. We then entered the shoulder using standard portals including anterior, posterior, and lateral portals.  We identified extensive tearing of the rotator cuff.  Remaining labral remnant was also torn.  We performed debridement of the labrum using motorized shaver and basket forceps as well as some loose and unstable rotator cuff tissue that we debrided. The subscapularis was visualized right at the glenoid rim anteriorly. Once we completed our debridement of the shoulder, the articular cartilage looked to be in pretty good shape, just a little bit of chondromalacia grade 2, grade 3, small erosions.  We placed the scope in subacromial space.  Thorough bursectomy was performed.  We assessed the cuff from the dorsal surface and again nonrepairable, retracted beyond the level of the glenoid.  The supra and infraspinatus, teres minor seemed to be intact.  Subscapularis not visualized.  Once we cleaned that up in the subacromial space, we did not do a formal acromioplasty with a bur as we felt like we needed to preserve the CA  ligament to make sure we did not have any escape.  At this point, we concluded the arthroscopic portion of the surgery and made a deltopectoral incision from the coracoid process anteriorly down towards anterior humerus, was about a 5-cm incision.  Dissection down through subcutaneous tissues using Bovie, identified cephalic vein, taken laterally and deltoid and pectoralis taken medially.  We were able to place a deep retractor lateral to the conjoined tendon.  I was basically stare directly at  the humeral head.  There was really very little evidence of subscap.  I could see a little bit of inferior part that was still attached.  We tracked that back and grabbed what was remaining of the subscap which was just really be that tendon and with the arm in neutral, we could just barely get it out to the lesser tuberosity.  If I internally rotated, I could definitely get subscap out.  The tissue quality was extremely poor, but I felt that this would give him the only chance for decent function other than a reverse shoulder replacement which he has got a physical individual in [place], so we want to make sure that we try to give him every chance to have the subscap heal.  So, we took the #2 FiberWire suture and did modified Mason-Allen suture technique and two 4.5 ConMed bio-composite suture anchors at the articular margin, double loaded with #2 nonabsorbable suture that we brought up through the tendon in a mattress fashion.  We then took the mattress sutures and some Mason-Allen sutures and used PushLock out lateral to the lesser tuberosity, so we had a total of 4 anchors with an anatomic repair of the subscapularis.  It was not under tension as long as his shoulder was internally rotated.  If we brought the shoulder out to neutral, it became under tension and at this point, we thoroughly irrigated the subdeltoid, subpectoral interval and then closed the deltopectoral interval with 0 Vicryl, followed by 2-0 Vicryl subcutaneous closure, 4-0 Monocryl for skin and portals.  Steri-Strips applied followed by sterile dressing.  The patient tolerated the surgery well.     Almedia Balls. Ranell Patrick, M.D.     SRN/MEDQ  D:  03/29/2011  T:  03/30/2011  Job:  [id number]

## 2011-03-30 NOTE — Progress Notes (Signed)
Has had itching during the night and worse this am. Rx'd with Benedryl and cortisone cream. Having difficulty with pain control, will try Toradol.  Prob. Home tomorrow.

## 2011-03-30 NOTE — Progress Notes (Signed)
OT Evaluation complete and in shadow chart & to be scanned into EPIC. Pt presents s/p R shoulder arthroplasty & has no further acute OT needs secondary to: All education is completed; Caregiver is Mod I w/ ADL activity and Pt will have necessary assist by caregiver @ D/C. Will sign off acute OT at this time.

## 2011-03-31 MED ORDER — HYDROMORPHONE HCL 2 MG PO TABS: 2.0000 mg | ORAL_TABLET | ORAL | Status: AC | PRN

## 2011-03-31 MED ORDER — METHOCARBAMOL 500 MG PO TABS: 500.0000 mg | ORAL_TABLET | Freq: Four times a day (QID) | ORAL | Status: AC | PRN

## 2011-03-31 NOTE — Progress Notes (Signed)
Subjective:Much better today .  Objective: Vital signs in last 24 hours: Temp:  [98.7 F (37.1 C)-100 F (37.8 C)] 99.7 F (37.6 C) (12/09 0656) Pulse Rate:  [57-69] 57  (12/09 0656) Resp:  [16-18] 18  (12/09 0656) BP: (112-137)/(64-86) 137/86 mmHg (12/09 0656) SpO2:  [89 %-95 %] 90 % (12/09 0656)  Intake/Output from previous day: 12/08 0701 - 12/09 0700 In: 275 [P.O.:275] Out: -  Intake/Output this shift:     Basename 03/30/11 0500  HGB 12.2*    Basename 03/30/11 0500  WBC --  RBC --  HCT 38.4*  PLT --    Basename 03/30/11 0500  NA 137  K 4.0  CL 102  CO2 27  BUN 12  CREATININE 1.01  GLUCOSE 99  CALCIUM 8.6   No results found for this basename: LABPT:2,INR:2 in the last 72 hours  Neurovascular intact Sensation intact distally Intact pulses distally Dressing clean and dry no bleed thru. Assessment/Plan:POD 2 Rt RCTR improved Will D/C Home   Mat Stuard W 03/31/2011, 10:07 AM

## 2011-04-01 NOTE — Discharge Summary (Signed)
Physician Discharge Summary  Patient ID: Andrew Hardy MRN: [id number] DOB/AGE: 1941-03-02 70 y.o.  Admit date: 03/29/2011 Discharge date: 04/01/2011  Admission Diagnoses:  Principal Problem:  *Subscapularis (muscle) sprain   Discharge Diagnoses:  Same   Surgeries: Procedure(s): ARTHROSCOPY SHOULDER with open subscap repair   Consultants: Occupational Therapy  Discharged Condition: Improved  Hospital Course: Andrew Hardy is an 70 y.o. male who was admitted 03/29/2011 with a chief complaint of shoulder pain, and found to have a diagnosis of Subscapularis (muscle) sprain.  They were brought to the operating room on 03/29/2011 and underwent the above named procedures.  Post op the patient was transferred to the floor with CPAP for OSA. Occ therapy instructed on gentle Codman's exercises and AROM wrist and elbow.  The patient had a  hospital course complicated by severe itching and poor pain control POD 1.  Benadryl and Toradol were added and the patient was finally stable for D/C on POD #2.    Recent vital signs:  Filed Vitals:   03/31/11 0656  BP: 137/86  Pulse: 57  Temp: 99.7 F (37.6 C)  Resp: 18    Recent laboratory studies:  Results for orders placed during the hospital encounter of 03/29/11  HEMOGLOBIN AND HEMATOCRIT, BLOOD      Component Value Range   Hemoglobin 12.2 (*) 13.0 - 17.0 (g/dL)   HCT 40.9 (*) 81.1 - 52.0 (%)  BASIC METABOLIC PANEL      Component Value Range   Sodium 137  135 - 145 (mEq/L)   Potassium 4.0  3.5 - 5.1 (mEq/L)   Chloride 102  96 - 112 (mEq/L)   CO2 27  19 - 32 (mEq/L)   Glucose, Bld 99  70 - 99 (mg/dL)   BUN 12  6 - 23 (mg/dL)   Creatinine, Ser 9.14  0.50 - 1.35 (mg/dL)   Calcium 8.6  8.4 - 78.2 (mg/dL)   GFR calc non Af Amer 74 (*) >90 (mL/min)   GFR calc Af Amer 86 (*) >90 (mL/min)    Discharge Medications:   Discharge Medication List as of 03/31/2011 10:29 AM    START taking these medications   Details  HYDROmorphone (DILAUDID) 2  MG tablet Take 1 tablet (2 mg total) by mouth every 4 (four) hours as needed., Starting 03/31/2011, Until Wed 04/10/11, Print    methocarbamol (ROBAXIN) 500 MG tablet Take 1 tablet (500 mg total) by mouth every 6 (six) hours as needed., Starting 03/31/2011, Until Wed 04/10/11, Print      CONTINUE these medications which have NOT CHANGED   Details  amLODipine (NORVASC) 5 MG tablet Take 5 mg by mouth 2 (two) times daily.  , Until Discontinued, Historical Med    aspirin 81 MG tablet Take 81 mg by mouth daily.  , Until Discontinued, Historical Med    clopidogrel (PLAVIX) 75 MG tablet Take 75 mg by mouth daily.  , Until Discontinued, Historical Med    Dutasteride-Tamsulosin HCl (JALYN) 0.5-0.4 MG CAPS Take 1 capsule by mouth daily.  , Until Discontinued, Historical Med    esomeprazole (NEXIUM) 40 MG capsule Take 40 mg by mouth daily before breakfast.  , Until Discontinued, Historical Med    FIBER, GUAR GUM, PO Take 3 tablets by mouth daily.  , Until Discontinued, Historical Med    Flaxseed, Linseed, (FLAX SEED OIL PO) Take 1 capsule by mouth daily.  , Until Discontinued, Historical Med    furosemide (LASIX) 40 MG tablet Take 40 mg by mouth  daily. Takes 40mg  one day and 80mg  another day , Until Discontinued, Historical Med    hydrALAZINE (APRESOLINE) 25 MG tablet Take 25 mg by mouth 2 (two) times daily.  , Until Discontinued, Historical Med    isosorbide mononitrate (IMDUR) 60 MG 24 hr tablet Take 60 mg by mouth daily.  , Until Discontinued, Historical Med    metoprolol (LOPRESSOR) 50 MG tablet Take 50 mg by mouth 2 (two) times daily.  , Until Discontinued, Historical Med    Multiple Vitamins-Minerals (MULTIVITAMINS THER. W/MINERALS) TABS Take 1 tablet by mouth daily.  , Until Discontinued, Historical Med    Omega-3 Fatty Acids (FISH OIL PO) Take 1 capsule by mouth daily.  , Until Discontinued, Historical Med    potassium chloride SA (K-DUR,KLOR-CON) 20 MEQ tablet Take 20 mEq by mouth daily.   , Until Discontinued, Historical Med    ramipril (ALTACE) 5 MG tablet Take 5 mg by mouth daily.  , Until Discontinued, Historical Med    simvastatin (ZOCOR) 40 MG tablet Take 40 mg by mouth at bedtime.  , Until Discontinued, Historical Med    sotalol (BETAPACE) 80 MG tablet Take 40 mg by mouth 2 (two) times daily.  , Until Discontinued, Historical Med    VITAMIN E PO Take 1 capsule by mouth daily.  , Until Discontinued, Historical Med    Glucosamine-Chondroit-Vit C-Mn (GLUCOSAMINE-CHONDROITIN) TABS Take 1 tablet by mouth daily.  , Until Discontinued, Historical Med        Diagnostic Studies: No results found.  Disposition: Home or Self Care  Discharge Orders    Future Orders Please Complete By Expires   Diet general      Call MD / Call 911      Comments:   If you experience chest pain or shortness of breath, CALL 911 and be transported to the hospital emergency room.  If you develope a fever above 101 F, pus (white drainage) or increased drainage or redness at the wound, or calf pain, call your surgeon's office.   Increase activity slowly as tolerated      Discharge instructions      Comments:   Keep dressing dry. Remove dressing 12/12 and cover incisions with dressing, keep dry till see Dr Ranell Patrick.   Driving restrictions      Comments:   No driving till you see Dr Kerrie Buffalo constantly.  Do gentle exercises daily.     Signed: Ebony Rickel,STEVEN R 04/01/2011, 1:18 PM

## 2011-04-02 ENCOUNTER — Encounter (HOSPITAL_COMMUNITY): Payer: Self-pay | Admitting: Orthopedic Surgery

## 2011-04-30 DIAGNOSIS — E785 Hyperlipidemia, unspecified: Secondary | ICD-10-CM | POA: Diagnosis not present

## 2011-04-30 DIAGNOSIS — I2510 Atherosclerotic heart disease of native coronary artery without angina pectoris: Secondary | ICD-10-CM | POA: Diagnosis not present

## 2011-04-30 DIAGNOSIS — E538 Deficiency of other specified B group vitamins: Secondary | ICD-10-CM | POA: Diagnosis not present

## 2011-05-09 DIAGNOSIS — M25519 Pain in unspecified shoulder: Secondary | ICD-10-CM | POA: Diagnosis not present

## 2011-05-28 DIAGNOSIS — E538 Deficiency of other specified B group vitamins: Secondary | ICD-10-CM | POA: Diagnosis not present

## 2011-06-10 DIAGNOSIS — H01009 Unspecified blepharitis unspecified eye, unspecified eyelid: Secondary | ICD-10-CM | POA: Diagnosis not present

## 2011-06-19 DIAGNOSIS — S46819A Strain of other muscles, fascia and tendons at shoulder and upper arm level, unspecified arm, initial encounter: Secondary | ICD-10-CM | POA: Diagnosis not present

## 2011-07-03 DIAGNOSIS — E538 Deficiency of other specified B group vitamins: Secondary | ICD-10-CM | POA: Diagnosis not present

## 2011-07-30 DIAGNOSIS — S46819A Strain of other muscles, fascia and tendons at shoulder and upper arm level, unspecified arm, initial encounter: Secondary | ICD-10-CM | POA: Diagnosis not present

## 2011-08-23 DIAGNOSIS — E538 Deficiency of other specified B group vitamins: Secondary | ICD-10-CM | POA: Diagnosis not present

## 2011-08-30 DIAGNOSIS — J309 Allergic rhinitis, unspecified: Secondary | ICD-10-CM | POA: Diagnosis not present

## 2011-09-26 DIAGNOSIS — E538 Deficiency of other specified B group vitamins: Secondary | ICD-10-CM | POA: Diagnosis not present

## 2011-10-08 DIAGNOSIS — M25519 Pain in unspecified shoulder: Secondary | ICD-10-CM | POA: Diagnosis not present

## 2011-10-22 DIAGNOSIS — H00019 Hordeolum externum unspecified eye, unspecified eyelid: Secondary | ICD-10-CM | POA: Diagnosis not present

## 2011-10-22 DIAGNOSIS — E538 Deficiency of other specified B group vitamins: Secondary | ICD-10-CM | POA: Diagnosis not present

## 2011-11-20 DIAGNOSIS — M25519 Pain in unspecified shoulder: Secondary | ICD-10-CM | POA: Diagnosis not present

## 2011-11-22 DIAGNOSIS — E538 Deficiency of other specified B group vitamins: Secondary | ICD-10-CM | POA: Diagnosis not present

## 2011-11-22 DIAGNOSIS — H01009 Unspecified blepharitis unspecified eye, unspecified eyelid: Secondary | ICD-10-CM | POA: Diagnosis not present

## 2011-12-03 DIAGNOSIS — I10 Essential (primary) hypertension: Secondary | ICD-10-CM | POA: Diagnosis not present

## 2011-12-03 DIAGNOSIS — I2510 Atherosclerotic heart disease of native coronary artery without angina pectoris: Secondary | ICD-10-CM | POA: Diagnosis not present

## 2011-12-03 DIAGNOSIS — E785 Hyperlipidemia, unspecified: Secondary | ICD-10-CM | POA: Diagnosis not present

## 2011-12-03 DIAGNOSIS — I209 Angina pectoris, unspecified: Secondary | ICD-10-CM | POA: Diagnosis not present

## 2011-12-04 DIAGNOSIS — I2510 Atherosclerotic heart disease of native coronary artery without angina pectoris: Secondary | ICD-10-CM | POA: Diagnosis not present

## 2011-12-25 DIAGNOSIS — E538 Deficiency of other specified B group vitamins: Secondary | ICD-10-CM | POA: Diagnosis not present

## 2012-01-16 DIAGNOSIS — M25569 Pain in unspecified knee: Secondary | ICD-10-CM | POA: Diagnosis not present

## 2012-01-27 DIAGNOSIS — Z23 Encounter for immunization: Secondary | ICD-10-CM | POA: Diagnosis not present

## 2012-01-27 DIAGNOSIS — E538 Deficiency of other specified B group vitamins: Secondary | ICD-10-CM | POA: Diagnosis not present

## 2012-02-24 DIAGNOSIS — E538 Deficiency of other specified B group vitamins: Secondary | ICD-10-CM | POA: Diagnosis not present

## 2012-03-30 DIAGNOSIS — E538 Deficiency of other specified B group vitamins: Secondary | ICD-10-CM | POA: Diagnosis not present

## 2012-03-31 DIAGNOSIS — G5620 Lesion of ulnar nerve, unspecified upper limb: Secondary | ICD-10-CM | POA: Diagnosis not present

## 2012-03-31 DIAGNOSIS — M79609 Pain in unspecified limb: Secondary | ICD-10-CM | POA: Diagnosis not present

## 2012-03-31 DIAGNOSIS — R209 Unspecified disturbances of skin sensation: Secondary | ICD-10-CM | POA: Diagnosis not present

## 2012-04-20 DIAGNOSIS — G5620 Lesion of ulnar nerve, unspecified upper limb: Secondary | ICD-10-CM | POA: Diagnosis not present

## 2012-04-29 DIAGNOSIS — E538 Deficiency of other specified B group vitamins: Secondary | ICD-10-CM | POA: Diagnosis not present

## 2012-04-30 DIAGNOSIS — G5620 Lesion of ulnar nerve, unspecified upper limb: Secondary | ICD-10-CM | POA: Diagnosis not present

## 2012-04-30 DIAGNOSIS — H01009 Unspecified blepharitis unspecified eye, unspecified eyelid: Secondary | ICD-10-CM | POA: Diagnosis not present

## 2012-04-30 DIAGNOSIS — R209 Unspecified disturbances of skin sensation: Secondary | ICD-10-CM | POA: Diagnosis not present

## 2012-04-30 DIAGNOSIS — H5200 Hypermetropia, unspecified eye: Secondary | ICD-10-CM | POA: Diagnosis not present

## 2012-05-01 DIAGNOSIS — I4891 Unspecified atrial fibrillation: Secondary | ICD-10-CM | POA: Diagnosis not present

## 2012-05-01 DIAGNOSIS — I2510 Atherosclerotic heart disease of native coronary artery without angina pectoris: Secondary | ICD-10-CM | POA: Diagnosis not present

## 2012-05-08 DIAGNOSIS — M5137 Other intervertebral disc degeneration, lumbosacral region: Secondary | ICD-10-CM | POA: Diagnosis not present

## 2012-05-13 DIAGNOSIS — G5620 Lesion of ulnar nerve, unspecified upper limb: Secondary | ICD-10-CM | POA: Diagnosis not present

## 2012-05-18 DIAGNOSIS — N401 Enlarged prostate with lower urinary tract symptoms: Secondary | ICD-10-CM | POA: Diagnosis not present

## 2012-05-18 DIAGNOSIS — R351 Nocturia: Secondary | ICD-10-CM | POA: Diagnosis not present

## 2012-05-18 DIAGNOSIS — N318 Other neuromuscular dysfunction of bladder: Secondary | ICD-10-CM | POA: Diagnosis not present

## 2012-05-18 DIAGNOSIS — N390 Urinary tract infection, site not specified: Secondary | ICD-10-CM | POA: Diagnosis not present

## 2012-05-25 DIAGNOSIS — G5620 Lesion of ulnar nerve, unspecified upper limb: Secondary | ICD-10-CM | POA: Diagnosis not present

## 2012-05-26 DIAGNOSIS — M5137 Other intervertebral disc degeneration, lumbosacral region: Secondary | ICD-10-CM | POA: Diagnosis not present

## 2012-05-26 DIAGNOSIS — E538 Deficiency of other specified B group vitamins: Secondary | ICD-10-CM | POA: Diagnosis not present

## 2012-06-01 DIAGNOSIS — M5137 Other intervertebral disc degeneration, lumbosacral region: Secondary | ICD-10-CM | POA: Diagnosis not present

## 2012-06-10 DIAGNOSIS — M5137 Other intervertebral disc degeneration, lumbosacral region: Secondary | ICD-10-CM | POA: Diagnosis not present

## 2012-06-10 DIAGNOSIS — M79609 Pain in unspecified limb: Secondary | ICD-10-CM | POA: Diagnosis not present

## 2012-06-23 DIAGNOSIS — E538 Deficiency of other specified B group vitamins: Secondary | ICD-10-CM | POA: Diagnosis not present

## 2012-06-23 DIAGNOSIS — M47817 Spondylosis without myelopathy or radiculopathy, lumbosacral region: Secondary | ICD-10-CM | POA: Diagnosis not present

## 2012-07-27 DIAGNOSIS — E538 Deficiency of other specified B group vitamins: Secondary | ICD-10-CM | POA: Diagnosis not present

## 2012-07-28 DIAGNOSIS — J309 Allergic rhinitis, unspecified: Secondary | ICD-10-CM | POA: Diagnosis not present

## 2012-08-04 DIAGNOSIS — I2510 Atherosclerotic heart disease of native coronary artery without angina pectoris: Secondary | ICD-10-CM | POA: Diagnosis not present

## 2012-08-04 DIAGNOSIS — E785 Hyperlipidemia, unspecified: Secondary | ICD-10-CM | POA: Diagnosis not present

## 2012-08-04 DIAGNOSIS — I10 Essential (primary) hypertension: Secondary | ICD-10-CM | POA: Diagnosis not present

## 2012-08-10 DIAGNOSIS — E119 Type 2 diabetes mellitus without complications: Secondary | ICD-10-CM | POA: Diagnosis not present

## 2012-08-10 DIAGNOSIS — I2510 Atherosclerotic heart disease of native coronary artery without angina pectoris: Secondary | ICD-10-CM | POA: Diagnosis not present

## 2012-09-01 DIAGNOSIS — E538 Deficiency of other specified B group vitamins: Secondary | ICD-10-CM | POA: Diagnosis not present

## 2012-09-23 DIAGNOSIS — E538 Deficiency of other specified B group vitamins: Secondary | ICD-10-CM | POA: Diagnosis not present

## 2012-10-20 DIAGNOSIS — I2510 Atherosclerotic heart disease of native coronary artery without angina pectoris: Secondary | ICD-10-CM | POA: Diagnosis not present

## 2012-10-20 DIAGNOSIS — M5137 Other intervertebral disc degeneration, lumbosacral region: Secondary | ICD-10-CM | POA: Diagnosis not present

## 2012-11-25 DIAGNOSIS — E538 Deficiency of other specified B group vitamins: Secondary | ICD-10-CM | POA: Diagnosis not present

## 2012-12-23 DIAGNOSIS — M545 Low back pain: Secondary | ICD-10-CM | POA: Diagnosis not present

## 2013-01-12 DIAGNOSIS — M5137 Other intervertebral disc degeneration, lumbosacral region: Secondary | ICD-10-CM | POA: Diagnosis not present

## 2013-01-12 DIAGNOSIS — M5126 Other intervertebral disc displacement, lumbar region: Secondary | ICD-10-CM | POA: Diagnosis not present

## 2013-01-25 DIAGNOSIS — E538 Deficiency of other specified B group vitamins: Secondary | ICD-10-CM | POA: Diagnosis not present

## 2013-01-26 DIAGNOSIS — M5137 Other intervertebral disc degeneration, lumbosacral region: Secondary | ICD-10-CM | POA: Diagnosis not present

## 2013-02-09 DIAGNOSIS — M5137 Other intervertebral disc degeneration, lumbosacral region: Secondary | ICD-10-CM | POA: Diagnosis not present

## 2013-02-22 DIAGNOSIS — Z23 Encounter for immunization: Secondary | ICD-10-CM | POA: Diagnosis not present

## 2013-02-22 DIAGNOSIS — E538 Deficiency of other specified B group vitamins: Secondary | ICD-10-CM | POA: Diagnosis not present

## 2013-03-03 DIAGNOSIS — IMO0002 Reserved for concepts with insufficient information to code with codable children: Secondary | ICD-10-CM | POA: Diagnosis not present

## 2013-03-03 DIAGNOSIS — M1710 Unilateral primary osteoarthritis, unspecified knee: Secondary | ICD-10-CM | POA: Diagnosis not present

## 2013-03-24 DIAGNOSIS — E538 Deficiency of other specified B group vitamins: Secondary | ICD-10-CM | POA: Diagnosis not present

## 2013-04-02 DIAGNOSIS — I209 Angina pectoris, unspecified: Secondary | ICD-10-CM | POA: Diagnosis not present

## 2013-04-02 DIAGNOSIS — I2510 Atherosclerotic heart disease of native coronary artery without angina pectoris: Secondary | ICD-10-CM | POA: Diagnosis not present

## 2013-04-02 DIAGNOSIS — I10 Essential (primary) hypertension: Secondary | ICD-10-CM | POA: Diagnosis not present

## 2013-04-02 DIAGNOSIS — E785 Hyperlipidemia, unspecified: Secondary | ICD-10-CM | POA: Diagnosis not present

## 2013-04-28 DIAGNOSIS — E538 Deficiency of other specified B group vitamins: Secondary | ICD-10-CM | POA: Diagnosis not present

## 2013-05-25 DIAGNOSIS — E538 Deficiency of other specified B group vitamins: Secondary | ICD-10-CM | POA: Diagnosis not present

## 2013-05-25 DIAGNOSIS — R079 Chest pain, unspecified: Secondary | ICD-10-CM | POA: Diagnosis not present

## 2013-05-25 DIAGNOSIS — I10 Essential (primary) hypertension: Secondary | ICD-10-CM | POA: Diagnosis not present

## 2013-05-25 DIAGNOSIS — E785 Hyperlipidemia, unspecified: Secondary | ICD-10-CM | POA: Diagnosis not present

## 2013-05-25 DIAGNOSIS — I4510 Unspecified right bundle-branch block: Secondary | ICD-10-CM | POA: Diagnosis not present

## 2013-06-04 DIAGNOSIS — N401 Enlarged prostate with lower urinary tract symptoms: Secondary | ICD-10-CM | POA: Diagnosis not present

## 2013-06-04 DIAGNOSIS — N390 Urinary tract infection, site not specified: Secondary | ICD-10-CM | POA: Diagnosis not present

## 2013-06-04 DIAGNOSIS — R351 Nocturia: Secondary | ICD-10-CM | POA: Diagnosis not present

## 2013-06-04 DIAGNOSIS — N318 Other neuromuscular dysfunction of bladder: Secondary | ICD-10-CM | POA: Diagnosis not present

## 2013-06-18 DIAGNOSIS — J0100 Acute maxillary sinusitis, unspecified: Secondary | ICD-10-CM | POA: Diagnosis not present

## 2013-06-21 DIAGNOSIS — E538 Deficiency of other specified B group vitamins: Secondary | ICD-10-CM | POA: Diagnosis not present

## 2013-06-21 DIAGNOSIS — J180 Bronchopneumonia, unspecified organism: Secondary | ICD-10-CM | POA: Diagnosis not present

## 2013-07-15 DIAGNOSIS — N138 Other obstructive and reflux uropathy: Secondary | ICD-10-CM | POA: Diagnosis not present

## 2013-07-15 DIAGNOSIS — Z23 Encounter for immunization: Secondary | ICD-10-CM | POA: Diagnosis not present

## 2013-07-15 DIAGNOSIS — M5137 Other intervertebral disc degeneration, lumbosacral region: Secondary | ICD-10-CM | POA: Diagnosis not present

## 2013-07-15 DIAGNOSIS — E538 Deficiency of other specified B group vitamins: Secondary | ICD-10-CM | POA: Diagnosis not present

## 2013-07-15 DIAGNOSIS — N401 Enlarged prostate with lower urinary tract symptoms: Secondary | ICD-10-CM | POA: Diagnosis not present

## 2013-07-15 DIAGNOSIS — I5020 Unspecified systolic (congestive) heart failure: Secondary | ICD-10-CM | POA: Diagnosis not present

## 2013-07-15 DIAGNOSIS — I2510 Atherosclerotic heart disease of native coronary artery without angina pectoris: Secondary | ICD-10-CM | POA: Diagnosis not present

## 2013-07-27 DIAGNOSIS — E538 Deficiency of other specified B group vitamins: Secondary | ICD-10-CM | POA: Diagnosis not present

## 2013-07-27 DIAGNOSIS — Z1211 Encounter for screening for malignant neoplasm of colon: Secondary | ICD-10-CM | POA: Diagnosis not present

## 2013-08-30 DIAGNOSIS — E538 Deficiency of other specified B group vitamins: Secondary | ICD-10-CM | POA: Diagnosis not present

## 2013-09-20 DIAGNOSIS — E538 Deficiency of other specified B group vitamins: Secondary | ICD-10-CM | POA: Diagnosis not present

## 2013-10-20 DIAGNOSIS — E538 Deficiency of other specified B group vitamins: Secondary | ICD-10-CM | POA: Diagnosis not present

## 2013-11-11 DIAGNOSIS — M542 Cervicalgia: Secondary | ICD-10-CM | POA: Diagnosis not present

## 2013-11-13 DIAGNOSIS — M5137 Other intervertebral disc degeneration, lumbosacral region: Secondary | ICD-10-CM | POA: Diagnosis not present

## 2013-11-13 DIAGNOSIS — M5126 Other intervertebral disc displacement, lumbar region: Secondary | ICD-10-CM | POA: Diagnosis not present

## 2013-11-17 DIAGNOSIS — I5020 Unspecified systolic (congestive) heart failure: Secondary | ICD-10-CM | POA: Diagnosis not present

## 2013-11-17 DIAGNOSIS — I10 Essential (primary) hypertension: Secondary | ICD-10-CM | POA: Diagnosis not present

## 2013-11-17 DIAGNOSIS — E785 Hyperlipidemia, unspecified: Secondary | ICD-10-CM | POA: Diagnosis not present

## 2013-11-17 DIAGNOSIS — Z6826 Body mass index (BMI) 26.0-26.9, adult: Secondary | ICD-10-CM | POA: Diagnosis not present

## 2013-11-17 DIAGNOSIS — I2510 Atherosclerotic heart disease of native coronary artery without angina pectoris: Secondary | ICD-10-CM | POA: Diagnosis not present

## 2013-12-02 DIAGNOSIS — I10 Essential (primary) hypertension: Secondary | ICD-10-CM | POA: Diagnosis not present

## 2013-12-02 DIAGNOSIS — I2510 Atherosclerotic heart disease of native coronary artery without angina pectoris: Secondary | ICD-10-CM | POA: Diagnosis not present

## 2013-12-02 DIAGNOSIS — E785 Hyperlipidemia, unspecified: Secondary | ICD-10-CM | POA: Diagnosis not present

## 2013-12-02 DIAGNOSIS — I4891 Unspecified atrial fibrillation: Secondary | ICD-10-CM | POA: Diagnosis not present

## 2013-12-06 DIAGNOSIS — H5200 Hypermetropia, unspecified eye: Secondary | ICD-10-CM | POA: Diagnosis not present

## 2013-12-06 DIAGNOSIS — H01009 Unspecified blepharitis unspecified eye, unspecified eyelid: Secondary | ICD-10-CM | POA: Diagnosis not present

## 2013-12-14 DIAGNOSIS — M545 Low back pain, unspecified: Secondary | ICD-10-CM | POA: Diagnosis not present

## 2013-12-31 DIAGNOSIS — E538 Deficiency of other specified B group vitamins: Secondary | ICD-10-CM | POA: Diagnosis not present

## 2014-01-04 DIAGNOSIS — E785 Hyperlipidemia, unspecified: Secondary | ICD-10-CM | POA: Diagnosis not present

## 2014-01-04 DIAGNOSIS — I2510 Atherosclerotic heart disease of native coronary artery without angina pectoris: Secondary | ICD-10-CM | POA: Diagnosis not present

## 2014-01-18 DIAGNOSIS — M25569 Pain in unspecified knee: Secondary | ICD-10-CM | POA: Diagnosis not present

## 2014-01-21 DIAGNOSIS — Z23 Encounter for immunization: Secondary | ICD-10-CM | POA: Diagnosis not present

## 2014-01-21 DIAGNOSIS — E538 Deficiency of other specified B group vitamins: Secondary | ICD-10-CM | POA: Diagnosis not present

## 2014-02-08 DIAGNOSIS — M5022 Other cervical disc displacement, mid-cervical region: Secondary | ICD-10-CM | POA: Diagnosis not present

## 2014-02-08 DIAGNOSIS — M542 Cervicalgia: Secondary | ICD-10-CM | POA: Diagnosis not present

## 2014-02-21 DIAGNOSIS — E538 Deficiency of other specified B group vitamins: Secondary | ICD-10-CM | POA: Diagnosis not present

## 2014-04-01 DIAGNOSIS — I480 Paroxysmal atrial fibrillation: Secondary | ICD-10-CM | POA: Diagnosis not present

## 2014-04-01 DIAGNOSIS — Z6837 Body mass index (BMI) 37.0-37.9, adult: Secondary | ICD-10-CM | POA: Diagnosis not present

## 2014-04-01 DIAGNOSIS — I10 Essential (primary) hypertension: Secondary | ICD-10-CM | POA: Diagnosis not present

## 2014-04-01 DIAGNOSIS — E538 Deficiency of other specified B group vitamins: Secondary | ICD-10-CM | POA: Diagnosis not present

## 2014-04-01 DIAGNOSIS — I2510 Atherosclerotic heart disease of native coronary artery without angina pectoris: Secondary | ICD-10-CM | POA: Diagnosis not present

## 2014-04-01 DIAGNOSIS — E785 Hyperlipidemia, unspecified: Secondary | ICD-10-CM | POA: Diagnosis not present

## 2014-04-05 DIAGNOSIS — M1711 Unilateral primary osteoarthritis, right knee: Secondary | ICD-10-CM | POA: Diagnosis not present

## 2014-04-05 DIAGNOSIS — M170 Bilateral primary osteoarthritis of knee: Secondary | ICD-10-CM | POA: Diagnosis not present

## 2014-04-05 DIAGNOSIS — M1712 Unilateral primary osteoarthritis, left knee: Secondary | ICD-10-CM | POA: Diagnosis not present

## 2014-04-05 DIAGNOSIS — M5136 Other intervertebral disc degeneration, lumbar region: Secondary | ICD-10-CM | POA: Diagnosis not present

## 2014-04-25 DIAGNOSIS — E538 Deficiency of other specified B group vitamins: Secondary | ICD-10-CM | POA: Diagnosis not present

## 2014-05-17 DIAGNOSIS — M5032 Other cervical disc degeneration, mid-cervical region: Secondary | ICD-10-CM | POA: Diagnosis not present

## 2014-05-17 DIAGNOSIS — M542 Cervicalgia: Secondary | ICD-10-CM | POA: Diagnosis not present

## 2014-05-21 DIAGNOSIS — M5032 Other cervical disc degeneration, mid-cervical region: Secondary | ICD-10-CM | POA: Diagnosis not present

## 2014-05-24 DIAGNOSIS — Z6837 Body mass index (BMI) 37.0-37.9, adult: Secondary | ICD-10-CM | POA: Diagnosis not present

## 2014-05-24 DIAGNOSIS — J019 Acute sinusitis, unspecified: Secondary | ICD-10-CM | POA: Diagnosis not present

## 2014-05-24 DIAGNOSIS — E538 Deficiency of other specified B group vitamins: Secondary | ICD-10-CM | POA: Diagnosis not present

## 2014-05-30 DIAGNOSIS — M542 Cervicalgia: Secondary | ICD-10-CM | POA: Diagnosis not present

## 2014-05-30 DIAGNOSIS — M5032 Other cervical disc degeneration, mid-cervical region: Secondary | ICD-10-CM | POA: Diagnosis not present

## 2014-05-31 DIAGNOSIS — M5136 Other intervertebral disc degeneration, lumbar region: Secondary | ICD-10-CM | POA: Diagnosis not present

## 2014-05-31 DIAGNOSIS — M5416 Radiculopathy, lumbar region: Secondary | ICD-10-CM | POA: Diagnosis not present

## 2014-05-31 DIAGNOSIS — J189 Pneumonia, unspecified organism: Secondary | ICD-10-CM | POA: Diagnosis not present

## 2014-05-31 DIAGNOSIS — Z6837 Body mass index (BMI) 37.0-37.9, adult: Secondary | ICD-10-CM | POA: Diagnosis not present

## 2014-06-01 DIAGNOSIS — R351 Nocturia: Secondary | ICD-10-CM | POA: Diagnosis not present

## 2014-06-01 DIAGNOSIS — N3090 Cystitis, unspecified without hematuria: Secondary | ICD-10-CM | POA: Diagnosis not present

## 2014-06-01 DIAGNOSIS — R3912 Poor urinary stream: Secondary | ICD-10-CM | POA: Diagnosis not present

## 2014-06-01 DIAGNOSIS — N318 Other neuromuscular dysfunction of bladder: Secondary | ICD-10-CM | POA: Diagnosis not present

## 2014-06-01 DIAGNOSIS — N401 Enlarged prostate with lower urinary tract symptoms: Secondary | ICD-10-CM | POA: Diagnosis not present

## 2014-06-07 DIAGNOSIS — J449 Chronic obstructive pulmonary disease, unspecified: Secondary | ICD-10-CM | POA: Diagnosis not present

## 2014-06-21 DIAGNOSIS — E538 Deficiency of other specified B group vitamins: Secondary | ICD-10-CM | POA: Diagnosis not present

## 2014-08-02 DIAGNOSIS — I10 Essential (primary) hypertension: Secondary | ICD-10-CM | POA: Diagnosis not present

## 2014-08-02 DIAGNOSIS — M5137 Other intervertebral disc degeneration, lumbosacral region: Secondary | ICD-10-CM | POA: Diagnosis not present

## 2014-08-02 DIAGNOSIS — I2510 Atherosclerotic heart disease of native coronary artery without angina pectoris: Secondary | ICD-10-CM | POA: Diagnosis not present

## 2014-08-02 DIAGNOSIS — Z6838 Body mass index (BMI) 38.0-38.9, adult: Secondary | ICD-10-CM | POA: Diagnosis not present

## 2014-08-02 DIAGNOSIS — E538 Deficiency of other specified B group vitamins: Secondary | ICD-10-CM | POA: Diagnosis not present

## 2014-08-02 DIAGNOSIS — N183 Chronic kidney disease, stage 3 (moderate): Secondary | ICD-10-CM | POA: Diagnosis not present

## 2014-08-02 DIAGNOSIS — I5020 Unspecified systolic (congestive) heart failure: Secondary | ICD-10-CM | POA: Diagnosis not present

## 2014-08-02 DIAGNOSIS — E785 Hyperlipidemia, unspecified: Secondary | ICD-10-CM | POA: Diagnosis not present

## 2014-08-17 DIAGNOSIS — L578 Other skin changes due to chronic exposure to nonionizing radiation: Secondary | ICD-10-CM | POA: Diagnosis not present

## 2014-08-17 DIAGNOSIS — Z6837 Body mass index (BMI) 37.0-37.9, adult: Secondary | ICD-10-CM | POA: Diagnosis not present

## 2014-08-17 DIAGNOSIS — L570 Actinic keratosis: Secondary | ICD-10-CM | POA: Diagnosis not present

## 2014-08-17 DIAGNOSIS — L859 Epidermal thickening, unspecified: Secondary | ICD-10-CM | POA: Diagnosis not present

## 2014-08-24 DIAGNOSIS — E538 Deficiency of other specified B group vitamins: Secondary | ICD-10-CM | POA: Diagnosis not present

## 2014-08-24 DIAGNOSIS — L578 Other skin changes due to chronic exposure to nonionizing radiation: Secondary | ICD-10-CM | POA: Diagnosis not present

## 2014-08-24 DIAGNOSIS — Z6837 Body mass index (BMI) 37.0-37.9, adult: Secondary | ICD-10-CM | POA: Diagnosis not present

## 2014-09-26 DIAGNOSIS — E538 Deficiency of other specified B group vitamins: Secondary | ICD-10-CM | POA: Diagnosis not present

## 2014-10-15 DIAGNOSIS — R0602 Shortness of breath: Secondary | ICD-10-CM | POA: Diagnosis not present

## 2014-10-15 DIAGNOSIS — Z6836 Body mass index (BMI) 36.0-36.9, adult: Secondary | ICD-10-CM | POA: Diagnosis not present

## 2014-10-15 DIAGNOSIS — R001 Bradycardia, unspecified: Secondary | ICD-10-CM

## 2014-10-15 DIAGNOSIS — Z79899 Other long term (current) drug therapy: Secondary | ICD-10-CM | POA: Diagnosis not present

## 2014-10-15 DIAGNOSIS — R079 Chest pain, unspecified: Secondary | ICD-10-CM | POA: Diagnosis not present

## 2014-10-15 DIAGNOSIS — I471 Supraventricular tachycardia: Secondary | ICD-10-CM | POA: Diagnosis not present

## 2014-10-15 DIAGNOSIS — Z8249 Family history of ischemic heart disease and other diseases of the circulatory system: Secondary | ICD-10-CM | POA: Diagnosis not present

## 2014-10-15 DIAGNOSIS — R42 Dizziness and giddiness: Secondary | ICD-10-CM | POA: Diagnosis not present

## 2014-10-15 DIAGNOSIS — R072 Precordial pain: Secondary | ICD-10-CM

## 2014-10-15 DIAGNOSIS — I2510 Atherosclerotic heart disease of native coronary artery without angina pectoris: Secondary | ICD-10-CM | POA: Diagnosis not present

## 2014-10-15 DIAGNOSIS — Z955 Presence of coronary angioplasty implant and graft: Secondary | ICD-10-CM | POA: Diagnosis not present

## 2014-10-15 DIAGNOSIS — I10 Essential (primary) hypertension: Secondary | ICD-10-CM | POA: Insufficient documentation

## 2014-10-15 DIAGNOSIS — E785 Hyperlipidemia, unspecified: Secondary | ICD-10-CM | POA: Diagnosis not present

## 2014-10-15 DIAGNOSIS — I4510 Unspecified right bundle-branch block: Secondary | ICD-10-CM | POA: Insufficient documentation

## 2014-10-15 DIAGNOSIS — Z9861 Coronary angioplasty status: Secondary | ICD-10-CM | POA: Diagnosis not present

## 2014-10-15 DIAGNOSIS — I252 Old myocardial infarction: Secondary | ICD-10-CM | POA: Diagnosis not present

## 2014-10-15 DIAGNOSIS — R0789 Other chest pain: Secondary | ICD-10-CM | POA: Diagnosis not present

## 2014-10-15 DIAGNOSIS — R531 Weakness: Secondary | ICD-10-CM | POA: Diagnosis not present

## 2014-10-15 DIAGNOSIS — Z885 Allergy status to narcotic agent status: Secondary | ICD-10-CM | POA: Diagnosis not present

## 2014-10-15 DIAGNOSIS — I259 Chronic ischemic heart disease, unspecified: Secondary | ICD-10-CM | POA: Insufficient documentation

## 2014-10-15 HISTORY — DX: Essential (primary) hypertension: I10

## 2014-10-15 HISTORY — DX: Precordial pain: R07.2

## 2014-10-15 HISTORY — DX: Chronic ischemic heart disease, unspecified: I25.9

## 2014-10-15 HISTORY — DX: Unspecified right bundle-branch block: I45.10

## 2014-10-15 HISTORY — DX: Bradycardia, unspecified: R00.1

## 2014-10-16 DIAGNOSIS — Z9861 Coronary angioplasty status: Secondary | ICD-10-CM | POA: Diagnosis not present

## 2014-10-16 DIAGNOSIS — R072 Precordial pain: Secondary | ICD-10-CM | POA: Diagnosis not present

## 2014-10-16 DIAGNOSIS — Z6835 Body mass index (BMI) 35.0-35.9, adult: Secondary | ICD-10-CM | POA: Diagnosis not present

## 2014-10-16 DIAGNOSIS — I2510 Atherosclerotic heart disease of native coronary artery without angina pectoris: Secondary | ICD-10-CM | POA: Diagnosis not present

## 2014-10-17 DIAGNOSIS — I493 Ventricular premature depolarization: Secondary | ICD-10-CM | POA: Diagnosis not present

## 2014-10-17 DIAGNOSIS — R072 Precordial pain: Secondary | ICD-10-CM | POA: Diagnosis not present

## 2014-10-17 DIAGNOSIS — I471 Supraventricular tachycardia: Secondary | ICD-10-CM | POA: Diagnosis not present

## 2014-10-17 DIAGNOSIS — I2510 Atherosclerotic heart disease of native coronary artery without angina pectoris: Secondary | ICD-10-CM | POA: Diagnosis not present

## 2014-10-17 DIAGNOSIS — I25118 Atherosclerotic heart disease of native coronary artery with other forms of angina pectoris: Secondary | ICD-10-CM | POA: Diagnosis not present

## 2014-10-17 DIAGNOSIS — R001 Bradycardia, unspecified: Secondary | ICD-10-CM | POA: Diagnosis not present

## 2014-10-17 DIAGNOSIS — Z6835 Body mass index (BMI) 35.0-35.9, adult: Secondary | ICD-10-CM | POA: Diagnosis not present

## 2014-10-18 DIAGNOSIS — I25118 Atherosclerotic heart disease of native coronary artery with other forms of angina pectoris: Secondary | ICD-10-CM | POA: Diagnosis not present

## 2014-10-18 DIAGNOSIS — I2510 Atherosclerotic heart disease of native coronary artery without angina pectoris: Secondary | ICD-10-CM

## 2014-10-18 DIAGNOSIS — Z6834 Body mass index (BMI) 34.0-34.9, adult: Secondary | ICD-10-CM | POA: Diagnosis not present

## 2014-10-18 DIAGNOSIS — Z9861 Coronary angioplasty status: Secondary | ICD-10-CM | POA: Diagnosis not present

## 2014-10-18 DIAGNOSIS — I25119 Atherosclerotic heart disease of native coronary artery with unspecified angina pectoris: Secondary | ICD-10-CM

## 2014-10-18 DIAGNOSIS — I471 Supraventricular tachycardia: Secondary | ICD-10-CM | POA: Diagnosis not present

## 2014-10-18 HISTORY — DX: Atherosclerotic heart disease of native coronary artery without angina pectoris: I25.10

## 2014-10-18 HISTORY — DX: Atherosclerotic heart disease of native coronary artery with unspecified angina pectoris: I25.119

## 2014-10-27 DIAGNOSIS — I249 Acute ischemic heart disease, unspecified: Secondary | ICD-10-CM | POA: Diagnosis not present

## 2014-10-27 DIAGNOSIS — E538 Deficiency of other specified B group vitamins: Secondary | ICD-10-CM | POA: Diagnosis not present

## 2014-10-27 DIAGNOSIS — I2510 Atherosclerotic heart disease of native coronary artery without angina pectoris: Secondary | ICD-10-CM | POA: Diagnosis not present

## 2014-10-27 DIAGNOSIS — Z6836 Body mass index (BMI) 36.0-36.9, adult: Secondary | ICD-10-CM | POA: Diagnosis not present

## 2014-10-27 DIAGNOSIS — Z9889 Other specified postprocedural states: Secondary | ICD-10-CM | POA: Diagnosis not present

## 2014-11-16 DIAGNOSIS — I471 Supraventricular tachycardia, unspecified: Secondary | ICD-10-CM

## 2014-11-16 HISTORY — DX: Supraventricular tachycardia, unspecified: I47.10

## 2014-11-16 HISTORY — DX: Supraventricular tachycardia: I47.1

## 2014-11-17 DIAGNOSIS — R42 Dizziness and giddiness: Secondary | ICD-10-CM | POA: Diagnosis not present

## 2014-11-17 DIAGNOSIS — I471 Supraventricular tachycardia: Secondary | ICD-10-CM | POA: Diagnosis not present

## 2014-11-17 DIAGNOSIS — E782 Mixed hyperlipidemia: Secondary | ICD-10-CM | POA: Diagnosis not present

## 2014-11-17 DIAGNOSIS — I2510 Atherosclerotic heart disease of native coronary artery without angina pectoris: Secondary | ICD-10-CM | POA: Diagnosis not present

## 2014-11-23 DIAGNOSIS — R001 Bradycardia, unspecified: Secondary | ICD-10-CM | POA: Diagnosis not present

## 2014-11-28 DIAGNOSIS — E538 Deficiency of other specified B group vitamins: Secondary | ICD-10-CM | POA: Diagnosis not present

## 2014-12-01 DIAGNOSIS — I159 Secondary hypertension, unspecified: Secondary | ICD-10-CM | POA: Diagnosis not present

## 2014-12-01 DIAGNOSIS — E785 Hyperlipidemia, unspecified: Secondary | ICD-10-CM | POA: Diagnosis not present

## 2014-12-14 DIAGNOSIS — I4891 Unspecified atrial fibrillation: Secondary | ICD-10-CM

## 2014-12-14 DIAGNOSIS — I484 Atypical atrial flutter: Secondary | ICD-10-CM

## 2014-12-14 HISTORY — DX: Atypical atrial flutter: I48.4

## 2014-12-14 HISTORY — DX: Unspecified atrial fibrillation: I48.91

## 2014-12-16 DIAGNOSIS — Z6835 Body mass index (BMI) 35.0-35.9, adult: Secondary | ICD-10-CM | POA: Diagnosis not present

## 2014-12-16 DIAGNOSIS — I10 Essential (primary) hypertension: Secondary | ICD-10-CM | POA: Diagnosis not present

## 2014-12-16 DIAGNOSIS — I484 Atypical atrial flutter: Secondary | ICD-10-CM | POA: Diagnosis not present

## 2014-12-16 DIAGNOSIS — I2510 Atherosclerotic heart disease of native coronary artery without angina pectoris: Secondary | ICD-10-CM | POA: Diagnosis not present

## 2014-12-16 DIAGNOSIS — F419 Anxiety disorder, unspecified: Secondary | ICD-10-CM | POA: Diagnosis not present

## 2014-12-30 DIAGNOSIS — E538 Deficiency of other specified B group vitamins: Secondary | ICD-10-CM | POA: Diagnosis not present

## 2015-01-25 DIAGNOSIS — Z23 Encounter for immunization: Secondary | ICD-10-CM | POA: Diagnosis not present

## 2015-01-25 DIAGNOSIS — E538 Deficiency of other specified B group vitamins: Secondary | ICD-10-CM | POA: Diagnosis not present

## 2015-02-20 DIAGNOSIS — H5203 Hypermetropia, bilateral: Secondary | ICD-10-CM | POA: Diagnosis not present

## 2015-02-20 DIAGNOSIS — H01009 Unspecified blepharitis unspecified eye, unspecified eyelid: Secondary | ICD-10-CM | POA: Diagnosis not present

## 2015-02-23 ENCOUNTER — Inpatient Hospital Stay: Admit: 2015-02-23 | Payer: Self-pay | Admitting: Orthopedic Surgery

## 2015-02-23 ENCOUNTER — Encounter (HOSPITAL_COMMUNITY): Payer: Self-pay | Admitting: Emergency Medicine

## 2015-02-23 ENCOUNTER — Encounter (HOSPITAL_COMMUNITY): Admission: EM | Disposition: A | Discharge status: Home / Self Care | Payer: Self-pay | Source: Home / Self Care

## 2015-02-23 ENCOUNTER — Encounter (HOSPITAL_COMMUNITY): Payer: Self-pay | Admitting: Certified Registered"

## 2015-02-23 ENCOUNTER — Ambulatory Visit (HOSPITAL_COMMUNITY)
Admission: EM | Admit: 2015-02-23 | Discharge: 2015-02-24 | Disposition: A | Discharge status: Home / Self Care | Payer: Medicare Other | Attending: Orthopedic Surgery | Admitting: Orthopedic Surgery

## 2015-02-23 DIAGNOSIS — S68124A Partial traumatic metacarpophalangeal amputation of right ring finger, initial encounter: Secondary | ICD-10-CM | POA: Insufficient documentation

## 2015-02-23 DIAGNOSIS — Z955 Presence of coronary angioplasty implant and graft: Secondary | ICD-10-CM | POA: Diagnosis not present

## 2015-02-23 DIAGNOSIS — S68114A Complete traumatic metacarpophalangeal amputation of right ring finger, initial encounter: Secondary | ICD-10-CM | POA: Diagnosis not present

## 2015-02-23 DIAGNOSIS — S68614A Complete traumatic transphalangeal amputation of right ring finger, initial encounter: Secondary | ICD-10-CM | POA: Diagnosis not present

## 2015-02-23 DIAGNOSIS — Z79899 Other long term (current) drug therapy: Secondary | ICD-10-CM | POA: Diagnosis not present

## 2015-02-23 DIAGNOSIS — Z23 Encounter for immunization: Secondary | ICD-10-CM | POA: Diagnosis not present

## 2015-02-23 DIAGNOSIS — Z87891 Personal history of nicotine dependence: Secondary | ICD-10-CM | POA: Insufficient documentation

## 2015-02-23 DIAGNOSIS — I10 Essential (primary) hypertension: Secondary | ICD-10-CM | POA: Diagnosis not present

## 2015-02-23 DIAGNOSIS — I252 Old myocardial infarction: Secondary | ICD-10-CM | POA: Diagnosis not present

## 2015-02-23 DIAGNOSIS — W270XXA Contact with workbench tool, initial encounter: Secondary | ICD-10-CM | POA: Diagnosis not present

## 2015-02-23 DIAGNOSIS — I2510 Atherosclerotic heart disease of native coronary artery without angina pectoris: Secondary | ICD-10-CM | POA: Insufficient documentation

## 2015-02-23 DIAGNOSIS — Z7902 Long term (current) use of antithrombotics/antiplatelets: Secondary | ICD-10-CM | POA: Diagnosis not present

## 2015-02-23 DIAGNOSIS — E785 Hyperlipidemia, unspecified: Secondary | ICD-10-CM | POA: Diagnosis not present

## 2015-02-23 DIAGNOSIS — E119 Type 2 diabetes mellitus without complications: Secondary | ICD-10-CM | POA: Diagnosis not present

## 2015-02-23 DIAGNOSIS — S62634B Displaced fracture of distal phalanx of right ring finger, initial encounter for open fracture: Secondary | ICD-10-CM | POA: Diagnosis not present

## 2015-02-23 HISTORY — PX: I & D EXTREMITY: SHX5045

## 2015-02-23 SURGERY — IRRIGATION AND DEBRIDEMENT EXTREMITY
Anesthesia: Monitor Anesthesia Care | Site: Finger | Laterality: Right

## 2015-02-23 MED ORDER — FENTANYL CITRATE (PF) 250 MCG/5ML IJ SOLN
INTRAMUSCULAR | Status: AC
  Filled 2015-02-23: qty 5

## 2015-02-23 MED ORDER — PROPOFOL 10 MG/ML IV BOLUS
INTRAVENOUS | Status: AC
  Filled 2015-02-23: qty 20

## 2015-02-23 SURGICAL SUPPLY — 55 items
BANDAGE COBAN STERILE 2 (GAUZE/BANDAGES/DRESSINGS) IMPLANT
BANDAGE ELASTIC 3 VELCRO ST LF (GAUZE/BANDAGES/DRESSINGS) IMPLANT
BANDAGE ELASTIC 4 VELCRO ST LF (GAUZE/BANDAGES/DRESSINGS) IMPLANT
BNDG COHESIVE 1X5 TAN STRL LF (GAUZE/BANDAGES/DRESSINGS) ×3 IMPLANT
BNDG CONFORM 2 STRL LF (GAUZE/BANDAGES/DRESSINGS) IMPLANT
BNDG ESMARK 4X9 LF (GAUZE/BANDAGES/DRESSINGS) IMPLANT
BNDG GAUZE ELAST 4 BULKY (GAUZE/BANDAGES/DRESSINGS) IMPLANT
CORDS BIPOLAR (ELECTRODE) ×3 IMPLANT
COVER SURGICAL LIGHT HANDLE (MISCELLANEOUS) ×3 IMPLANT
DECANTER SPIKE VIAL GLASS SM (MISCELLANEOUS) IMPLANT
DRAIN PENROSE 1/4X12 LTX STRL (WOUND CARE) ×3 IMPLANT
DRAPE MICROSCOPE LEICA (MISCELLANEOUS) IMPLANT
DRAPE OEC MINIVIEW 54X84 (DRAPES) IMPLANT
DRSG ADAPTIC 3X8 NADH LF (GAUZE/BANDAGES/DRESSINGS) IMPLANT
DRSG EMULSION OIL 3X3 NADH (GAUZE/BANDAGES/DRESSINGS) IMPLANT
DRSG PAD ABDOMINAL 8X10 ST (GAUZE/BANDAGES/DRESSINGS) IMPLANT
GAUZE SPONGE 4X4 12PLY STRL (GAUZE/BANDAGES/DRESSINGS) ×3 IMPLANT
GAUZE XEROFORM 1X8 LF (GAUZE/BANDAGES/DRESSINGS) ×3 IMPLANT
GLOVE BIO SURGEON STRL SZ7.5 (GLOVE) ×3 IMPLANT
GLOVE BIOGEL PI IND STRL 8 (GLOVE) ×1 IMPLANT
GLOVE BIOGEL PI INDICATOR 8 (GLOVE) ×2
GOWN STRL REUS W/ TWL LRG LVL3 (GOWN DISPOSABLE) ×1 IMPLANT
GOWN STRL REUS W/TWL LRG LVL3 (GOWN DISPOSABLE) ×2
KIT BASIN OR (CUSTOM PROCEDURE TRAY) ×3 IMPLANT
KIT ROOM TURNOVER OR (KITS) ×3 IMPLANT
LOOP VESSEL MAXI BLUE (MISCELLANEOUS) IMPLANT
LOOP VESSEL MINI RED (MISCELLANEOUS) IMPLANT
MANIFOLD NEPTUNE II (INSTRUMENTS) IMPLANT
NEEDLE HYPO 25X1 1.5 SAFETY (NEEDLE) ×6 IMPLANT
NS IRRIG 1000ML POUR BTL (IV SOLUTION) ×6 IMPLANT
PACK ORTHO EXTREMITY (CUSTOM PROCEDURE TRAY) ×3 IMPLANT
PAD ARMBOARD 7.5X6 YLW CONV (MISCELLANEOUS) ×6 IMPLANT
SCRUB BETADINE 4OZ XXX (MISCELLANEOUS) ×3 IMPLANT
SET CYSTO W/LG BORE CLAMP LF (SET/KITS/TRAYS/PACK) IMPLANT
SOLUTION BETADINE 4OZ (MISCELLANEOUS) ×3 IMPLANT
SPLINT FINGER W/BULB (SOFTGOODS) ×3 IMPLANT
SPONGE LAP 18X18 X RAY DECT (DISPOSABLE) ×3 IMPLANT
SPONGE LAP 4X18 X RAY DECT (DISPOSABLE) IMPLANT
SUCTION FRAZIER TIP 10 FR DISP (SUCTIONS) ×3 IMPLANT
SUT CHROMIC 6 0 PS 4 (SUTURE) IMPLANT
SUT ETHILON 4 0 P 3 18 (SUTURE) IMPLANT
SUT ETHILON 4 0 PS 2 18 (SUTURE) ×3 IMPLANT
SUT MERSILENE 4 0 P 3 (SUTURE) IMPLANT
SUT MON AB 5-0 P3 18 (SUTURE) ×3 IMPLANT
SUT PROLENE 3 0 PS 2 (SUTURE) IMPLANT
SYR CONTROL 10ML LL (SYRINGE) ×3 IMPLANT
TOWEL OR 17X24 6PK STRL BLUE (TOWEL DISPOSABLE) ×3 IMPLANT
TOWEL OR 17X26 10 PK STRL BLUE (TOWEL DISPOSABLE) ×3 IMPLANT
TUBE ANAEROBIC SPECIMEN COL (MISCELLANEOUS) IMPLANT
TUBE CONNECTING 12'X1/4 (SUCTIONS) ×1
TUBE CONNECTING 12X1/4 (SUCTIONS) ×2 IMPLANT
TUBE FEEDING 5FR 15 INCH (TUBING) IMPLANT
UNDERPAD 30X30 INCONTINENT (UNDERPADS AND DIAPERS) ×3 IMPLANT
WATER STERILE IRR 1000ML POUR (IV SOLUTION) ×3 IMPLANT
YANKAUER SUCT BULB TIP NO VENT (SUCTIONS) IMPLANT

## 2015-02-23 NOTE — ED Notes (Addendum)
Pt states that he was attempting to grab a leaf from a bean auger and his right ring finger was caught in the machine. Pt arrives with his finger bandaged and an IV in place.

## 2015-02-23 NOTE — ED Notes (Signed)
OR states that they are ready for the pt.

## 2015-02-23 NOTE — ED Notes (Signed)
Fredna Dow, MD at bedside.

## 2015-02-23 NOTE — ED Notes (Signed)
Pt transported to the OR holding, room 36.

## 2015-02-23 NOTE — ED Provider Notes (Signed)
MSE was initiated and I personally evaluated the patient and placed orders (if any) at  11:00 PM on February 23, 2015.  Pt was seen at [facility] ER  for a possible degloving finger injury.  Dr Fredna Dow was contacted.  Pt was sent here to be evaluated by Dr Fredna Dow.  Pt denies any other complaints.  Her appears stable.  Dr Fredna Dow will be contacted.  Dorie Rank, MD 02/23/15 [phone]

## 2015-02-24 ENCOUNTER — Emergency Department (HOSPITAL_COMMUNITY): Payer: Medicare Other | Admitting: Anesthesiology

## 2015-02-24 ENCOUNTER — Encounter (HOSPITAL_COMMUNITY): Payer: Self-pay | Admitting: Orthopedic Surgery

## 2015-02-24 DIAGNOSIS — E538 Deficiency of other specified B group vitamins: Secondary | ICD-10-CM | POA: Diagnosis not present

## 2015-02-24 DIAGNOSIS — E785 Hyperlipidemia, unspecified: Secondary | ICD-10-CM | POA: Diagnosis not present

## 2015-02-24 DIAGNOSIS — I10 Essential (primary) hypertension: Secondary | ICD-10-CM | POA: Diagnosis not present

## 2015-02-24 DIAGNOSIS — S68124A Partial traumatic metacarpophalangeal amputation of right ring finger, initial encounter: Secondary | ICD-10-CM | POA: Diagnosis not present

## 2015-02-24 DIAGNOSIS — I252 Old myocardial infarction: Secondary | ICD-10-CM | POA: Diagnosis not present

## 2015-02-24 MED ORDER — CEFAZOLIN SODIUM-DEXTROSE 2-3 GM-% IV SOLR
INTRAVENOUS | Status: AC
  Filled 2015-02-24: qty 50

## 2015-02-24 MED ORDER — CEFAZOLIN SODIUM-DEXTROSE 2-3 GM-% IV SOLR
INTRAVENOUS | Status: DC | PRN
  Administered 2015-02-24: 2 g via INTRAVENOUS

## 2015-02-24 MED ORDER — LIDOCAINE HCL (PF) 1 % IJ SOLN
INTRAMUSCULAR | Status: AC
  Filled 2015-02-24: qty 30

## 2015-02-24 MED ORDER — DIPHENHYDRAMINE HCL 50 MG/ML IJ SOLN
INTRAMUSCULAR | Status: DC | PRN
  Administered 2015-02-24: 12.5 mg via INTRAVENOUS

## 2015-02-24 MED ORDER — DIPHENHYDRAMINE HCL 50 MG/ML IJ SOLN
INTRAMUSCULAR | Status: AC
  Filled 2015-02-24: qty 1

## 2015-02-24 MED ORDER — ONDANSETRON HCL 4 MG/2ML IJ SOLN
INTRAMUSCULAR | Status: DC | PRN
  Administered 2015-02-24: 4 mg via INTRAVENOUS

## 2015-02-24 MED ORDER — DOXYCYCLINE HYCLATE 50 MG PO CAPS: 100.0000 mg | ORAL_CAPSULE | Freq: Two times a day (BID) | ORAL | Status: DC

## 2015-02-24 MED ORDER — PROPOFOL 10 MG/ML IV BOLUS
INTRAVENOUS | Status: DC | PRN
  Administered 2015-02-24: 20 mg via INTRAVENOUS
  Administered 2015-02-24: 30 mg via INTRAVENOUS

## 2015-02-24 MED ORDER — BUPIVACAINE HCL (PF) 0.25 % IJ SOLN
INTRAMUSCULAR | Status: DC | PRN
  Administered 2015-02-24: 5 mL

## 2015-02-24 MED ORDER — FENTANYL CITRATE (PF) 100 MCG/2ML IJ SOLN
INTRAMUSCULAR | Status: DC | PRN
Start: 2015-02-24 — End: 2015-02-24
  Administered 2015-02-24 (×3): 50 ug via INTRAVENOUS

## 2015-02-24 MED ORDER — ONDANSETRON HCL 4 MG/2ML IJ SOLN
INTRAMUSCULAR | Status: AC
  Filled 2015-02-24: qty 2

## 2015-02-24 MED ORDER — LIDOCAINE HCL (CARDIAC) 20 MG/ML IV SOLN
INTRAVENOUS | Status: AC
  Filled 2015-02-24: qty 5

## 2015-02-24 MED ORDER — HYDROCODONE-ACETAMINOPHEN 5-325 MG PO TABS: ORAL_TABLET | ORAL | Status: DC

## 2015-02-24 MED ORDER — BUPIVACAINE HCL (PF) 0.25 % IJ SOLN
INTRAMUSCULAR | Status: AC
  Filled 2015-02-24: qty 30

## 2015-02-24 MED ORDER — LIDOCAINE HCL 1 % IJ SOLN
INTRAMUSCULAR | Status: DC | PRN
  Administered 2015-02-24: 5 mL

## 2015-02-24 MED ORDER — LACTATED RINGERS IV SOLN
INTRAVENOUS | Status: DC | PRN
  Administered 2015-02-24: 01:00:00 via INTRAVENOUS

## 2015-02-24 NOTE — Anesthesia Preprocedure Evaluation (Signed)
Anesthesia Evaluation  Patient identified by MRN, date of birth, ID band Patient awake    Reviewed: Allergy & Precautions, NPO status , Patient's Chart, lab work & pertinent test results  Airway Mallampati: II  TM Distance: >3 FB Neck ROM: Full    Dental  (+) Teeth Intact   Pulmonary former smoker,    breath sounds clear to auscultation       Cardiovascular hypertension, + CAD and + Past MI   Rhythm:Regular Rate:Normal     Neuro/Psych    GI/Hepatic GERD  ,  Endo/Other    Renal/GU      Musculoskeletal   Abdominal   Peds  Hematology   Anesthesia Other Findings   Reproductive/Obstetrics                             Anesthesia Physical Anesthesia Plan  ASA: III and emergent  Anesthesia Plan: MAC   Post-op Pain Management:    Induction: Intravenous  Airway Management Planned: Natural Airway and Simple Face Mask  Additional Equipment:   Intra-op Plan:   Post-operative Plan:   Informed Consent: I have reviewed the patients History and Physical, chart, labs and discussed the procedure including the risks, benefits and alternatives for the proposed anesthesia with the patient or authorized representative who has indicated his/her understanding and acceptance.   Dental advisory given  Plan Discussed with: CRNA and Surgeon  Anesthesia Plan Comments:         Anesthesia Quick Evaluation

## 2015-02-24 NOTE — Op Note (Signed)
NAME:  Andrew [facility], Andrew Hardy                   ACCOUNT NO.:  [id number]  MEDICAL RECORD NO.:  [id number]  LOCATION:  [facility]                         FACILITY:  [facility]  PHYSICIAN:  Leanora Cover, MD        DATE OF BIRTH:  1941/01/04  DATE OF PROCEDURE:  02/24/2015 DATE OF DISCHARGE:  02/24/2015                              OPERATIVE REPORT   PREOPERATIVE DIAGNOSIS:  Right ring finger amputation.  POSTOPERATIVE DIAGNOSIS:  Right ring finger amputation.  PROCEDURE:  Revision amputation, right ring finger.  SURGEON:  Leanora Cover, MD.  ASSISTANT:  None.  ANESTHESIA:  Digital block with IV sedation.  IV FLUIDS:  Per anesthesia flow sheet.  ESTIMATED BLOOD LOSS:  Minimal.  COMPLICATIONS:  None.  SPECIMENS:  None.  TOURNIQUET:  Penrose drain 21 minutes.  DISPOSITION:  Stable to PACU.  INDICATIONS:  Mr. Gunther is a 74 year old right hand dominant male who states that yesterday evening while using an Auger.  He amputated the tip of the right ring finger.  He was seen at [facility] ER and transferred to [facility] for further care.  On examination, he had an oblique amputation of the right ring finger.  I recommended revision amputation. Risks, benefits, and alternatives of surgery were discussed including the risk of blood loss; infection; damage to nerves, vessels, tendons, ligaments, bone; failure of surgery; need for additional surgery; complications with wound healing; continued pain; and need for further amputation.  He voiced understanding of these risks and elected to proceed.  OPERATIVE COURSE:  After being identified preoperatively by myself, the patient and I agreed upon the procedure and site of procedure.  Surgical site was marked.  The risks, benefits, and alternatives of the surgery were reviewed and he wished to proceed.  Surgical consent had been signed.  He was given IV Ancef as preoperative antibiotic coverage.  His tetanus had been updated in the emergency department.  He  was transported to the operating room and placed on the operating room table in a supine position with the right upper extremity on an armboard.  IV sedation was induced by anesthesiologist.  Digital block was performed with 10 mL of half and half solution with 1% plain lidocaine and 0.25% plain Marcaine.  This was adequate to give him digital anesthesia.  The right upper extremity was prepped and draped in normal sterile orthopedic fashion.  Surgical pause was performed between surgeon, anesthesia, and operating room staff, and all were in agreement as to the patient, procedure, and site of procedure.  Surgical pause had been performed prior to the digital block as well.  Penrose drain was used as a tourniquet and was up for approximately 21 minutes.  The wound was explored.  There was no gross contamination.  The soft tissues were released from the distal phalanx.  The nailfold and germinal matrix were removed.  The soft tissues were mobilized.  Bipolar electrocautery was used to obtain hemostasis and try and ensure minimal postoperative bleeding.  The soft tissues were able to be brought over the ends of the bone after the bone was shortened with the bone cutters and rongeurs. The insertion of the FDP tendon  was left intact.  5-0 Monocryl suture was used in an interrupted fashion to reapproximate soft tissues over the end of the bone.  There was good bone coverage.  There was good contour to the fingertip.  The wound was then dressed with sterile Xeroform and 4 x 4 and wrapped with a Coban dressing lightly.  An Alumafoam splint was placed and wrapped lightly with Coban dressing. The Penrose drain was removed.  The fingertips were all pink with brisk capillary refill after completion of the procedure.  The operative drapes were broken down and the patient was awoken from anesthesia safely.  He was transferred back to the stretcher and taken to PACU in stable condition.  I will see him  back in the office in 1 week for postoperative followup.  We will give him Norco 5/325 one to two p.o. q.6 hours p.r.n. pain, dispensed #30.  He states he is able to take this if he takes some Benadryl with it as it causes itching.  We will also give him Bactrim DS one p.o. b.i.d. x7 days.     Leanora Cover, MD     KK/MEDQ  D:  02/24/2015  T:  02/24/2015  Job:  [id number]

## 2015-02-24 NOTE — H&P (Signed)
  Andrew [facility] is an 74 y.o. male.   Chief Complaint: right ring finger amputation HPI: 74 yo rhd male states he got right ring finger in auger yesterday evening.  Seen at [facility] ED and transferred for care.  He reports no previous injury to right hand and no other injury at this time.  Past Medical History  Diagnosis Date  . Myocardial infarction ([facility]) 2003  . Hypertension   . Enlarged prostate without lower urinary tract symptoms (luts) 2012    frequent urination and nocturia; sees dr Andrew Hardy in [place]  . Arthritis   . Hyperlipidemia   . Coronary artery disease 2003    s/p MI and numerous stents  . GERD (gastroesophageal reflux disease)     Past Surgical History  Procedure Laterality Date  . Shoulder open rotator cuff repair      3 on rt shoulder; 2 on left  . Cardiac catheterization  10-2010    ptca/stent in [place]  . Knee arthroscopy w/ meniscal repair  2000    left knee  . Coronary angioplasty    . Eye surgery  05-1010    bil cataract ,iol  . Lumbar epidural injection  2011    low back pain; DDD  . Shoulder arthroscopy  03/29/2011    Procedure: ARTHROSCOPY SHOULDER;  Surgeon: Augustin Schooling;  Location: [facility];  Service: Orthopedics;  Laterality: Right;  RIGHT SHOULDER ARTHROSCOPY WITH OPEN SUBSCAPULAR REPAIR    History reviewed. No pertinent family history. Social History:  reports that he quit smoking about 20 years ago. His smoking use included Cigarettes. He has a 20 pack-year smoking history. He quit smokeless tobacco use about 18 years ago. His smokeless tobacco use included Chew. He reports that he drinks about 3.6 oz of alcohol per week. He reports that he does not use illicit drugs.  Allergies:  Allergies  Allergen Reactions  . Hydrocodone Itching  . Morphine And Related Other (See Comments)    Pt states morphine is not effective for pain management  . Percocet [Oxycodone-Acetaminophen] Itching     (Not in a hospital admission)  No results found for  this or any previous visit (from the past 48 hour(s)).  No results found.   A comprehensive review of systems was negative.  Blood pressure 105/54, pulse 51, temperature 98.2 F (36.8 C), temperature source Oral, resp. rate 16, SpO2 94 %.  General appearance: alert, cooperative and appears stated age Head: Normocephalic, without obvious abnormality, atraumatic Neck: supple, symmetrical, trachea midline Resp: clear to auscultation bilaterally Cardio: regular rate and rhythm GI: non tender Extremities: intact sensation and capillary refill all digits.  +epl/fpl/io.  right ring with obliqueamputation. Pulses: 2+ and symmetric Skin: Skin color, texture, turgor normal. No rashes or lesions Neurologic: Grossly normal Incision/Wound: As above  Assessment/Plan Right ring finger amputation.  Recommend OR for revision amputation.  Risks, benefits, and alternatives of surgery were discussed and the patient agrees with the plan of care.   Andrew Hardy 02/24/2015, 12:36 AM

## 2015-02-24 NOTE — Discharge Instructions (Signed)

## 2015-02-24 NOTE — Op Note (Signed)
[id number] 

## 2015-02-24 NOTE — Brief Op Note (Signed)
02/23/2015 - 02/24/2015  1:39 AM  PATIENT:  Andrew Hardy  74 y.o. male  PRE-OPERATIVE DIAGNOSIS:  AMPUTATION OF RIGHT RING FINGER  POST-OPERATIVE DIAGNOSIS:  AMPUTATION OF TIP OF RIGHT RING FINGER  PROCEDURE:  Procedure(s): RIGHT RING FINGER REVISION AMPUTATION (Right)  SURGEON:  Surgeon(s) and Role:    * Leanora Cover, MD - Primary  PHYSICIAN ASSISTANT:   ASSISTANTS: none   ANESTHESIA:   local and IV sedation  EBL:  Total I/O In: 900 [I.V.:900] Out: -   BLOOD ADMINISTERED:none  DRAINS: none   LOCAL MEDICATIONS USED:  MARCAINE    and LIDOCAINE   SPECIMEN:  No Specimen  DISPOSITION OF SPECIMEN:  N/A  COUNTS:  YES  TOURNIQUET:  Penrose drain 21 minutes  DICTATION: .Other Dictation: Dictation Number [id number]  PLAN OF CARE: Discharge to home after PACU  PATIENT DISPOSITION:  PACU - hemodynamically stable.

## 2015-02-24 NOTE — Anesthesia Postprocedure Evaluation (Signed)
  Anesthesia Post-op Note  Patient: Andrew [facility]  Procedure(s) Performed: Procedure(s): RIGHT RING FINGER REVISION AMPUTATION (Right)  Patient Location: PACU  Anesthesia Type:MAC  Level of Consciousness: awake, alert  and oriented  Airway and Oxygen Therapy: Patient Spontanous Breathing  Post-op Pain: none  Post-op Assessment: Post-op Vital signs reviewed, Patient's Cardiovascular Status Stable, Respiratory Function Stable, Patent Airway, No signs of Nausea or vomiting and Pain level controlled              Post-op Vital Signs: Reviewed and stable  Last Vitals:  Filed Vitals:   02/23/15 2352  BP: 105/54  Pulse: 51  Temp:   Resp: 16    Complications: No apparent anesthesia complications

## 2015-02-24 NOTE — Transfer of Care (Signed)
Immediate Anesthesia Transfer of Care Note  Patient: Andrew Hardy  Procedure(s) Performed: Procedure(s): RIGHT RING FINGER REVISION AMPUTATION (Right)  Patient Location: PACU  Anesthesia Type:MAC  Level of Consciousness: awake, alert  and oriented  Airway & Oxygen Therapy: Patient Spontanous Breathing  Post-op Assessment: Report given to RN and Post -op Vital signs reviewed and stable  Post vital signs: Reviewed and stable  Last Vitals:  Filed Vitals:   02/23/15 2352  BP: 105/54  Pulse: 51  Temp:   Resp: 16    Complications: No apparent anesthesia complications

## 2015-03-20 DIAGNOSIS — S68114A Complete traumatic metacarpophalangeal amputation of right ring finger, initial encounter: Secondary | ICD-10-CM

## 2015-03-20 HISTORY — DX: Complete traumatic metacarpophalangeal amputation of right ring finger, initial encounter: S68.114A

## 2015-03-24 DIAGNOSIS — E538 Deficiency of other specified B group vitamins: Secondary | ICD-10-CM | POA: Diagnosis not present

## 2015-04-05 DIAGNOSIS — N183 Chronic kidney disease, stage 3 (moderate): Secondary | ICD-10-CM | POA: Diagnosis not present

## 2015-04-05 DIAGNOSIS — Z6837 Body mass index (BMI) 37.0-37.9, adult: Secondary | ICD-10-CM | POA: Diagnosis not present

## 2015-04-05 DIAGNOSIS — I2510 Atherosclerotic heart disease of native coronary artery without angina pectoris: Secondary | ICD-10-CM | POA: Diagnosis not present

## 2015-04-05 DIAGNOSIS — E785 Hyperlipidemia, unspecified: Secondary | ICD-10-CM | POA: Diagnosis not present

## 2015-04-05 DIAGNOSIS — I10 Essential (primary) hypertension: Secondary | ICD-10-CM | POA: Diagnosis not present

## 2015-04-12 DIAGNOSIS — M5136 Other intervertebral disc degeneration, lumbar region: Secondary | ICD-10-CM | POA: Diagnosis not present

## 2015-04-12 DIAGNOSIS — S79911A Unspecified injury of right hip, initial encounter: Secondary | ICD-10-CM | POA: Diagnosis not present

## 2015-04-18 DIAGNOSIS — S68114A Complete traumatic metacarpophalangeal amputation of right ring finger, initial encounter: Secondary | ICD-10-CM | POA: Diagnosis not present

## 2015-04-20 DIAGNOSIS — N3020 Other chronic cystitis without hematuria: Secondary | ICD-10-CM | POA: Diagnosis not present

## 2015-04-20 DIAGNOSIS — N453 Epididymo-orchitis: Secondary | ICD-10-CM | POA: Diagnosis not present

## 2015-04-20 DIAGNOSIS — N451 Epididymitis: Secondary | ICD-10-CM | POA: Diagnosis not present

## 2015-04-21 DIAGNOSIS — N5089 Other specified disorders of the male genital organs: Secondary | ICD-10-CM | POA: Diagnosis not present

## 2015-04-21 DIAGNOSIS — N5082 Scrotal pain: Secondary | ICD-10-CM | POA: Diagnosis not present

## 2015-04-21 DIAGNOSIS — N452 Orchitis: Secondary | ICD-10-CM | POA: Diagnosis not present

## 2015-04-25 DIAGNOSIS — N453 Epididymo-orchitis: Secondary | ICD-10-CM | POA: Diagnosis not present

## 2015-04-25 DIAGNOSIS — N3020 Other chronic cystitis without hematuria: Secondary | ICD-10-CM | POA: Diagnosis not present

## 2015-04-27 DIAGNOSIS — R945 Abnormal results of liver function studies: Secondary | ICD-10-CM | POA: Diagnosis not present

## 2015-05-04 DIAGNOSIS — Z79899 Other long term (current) drug therapy: Secondary | ICD-10-CM | POA: Diagnosis not present

## 2015-05-04 DIAGNOSIS — Z6834 Body mass index (BMI) 34.0-34.9, adult: Secondary | ICD-10-CM | POA: Diagnosis not present

## 2015-05-04 DIAGNOSIS — R7989 Other specified abnormal findings of blood chemistry: Secondary | ICD-10-CM | POA: Diagnosis not present

## 2015-05-04 DIAGNOSIS — I484 Atypical atrial flutter: Secondary | ICD-10-CM | POA: Diagnosis not present

## 2015-05-16 DIAGNOSIS — M25512 Pain in left shoulder: Secondary | ICD-10-CM | POA: Diagnosis not present

## 2015-05-22 DIAGNOSIS — N318 Other neuromuscular dysfunction of bladder: Secondary | ICD-10-CM | POA: Diagnosis not present

## 2015-05-22 DIAGNOSIS — N401 Enlarged prostate with lower urinary tract symptoms: Secondary | ICD-10-CM | POA: Diagnosis not present

## 2015-05-22 DIAGNOSIS — R3912 Poor urinary stream: Secondary | ICD-10-CM | POA: Diagnosis not present

## 2015-05-22 DIAGNOSIS — N451 Epididymitis: Secondary | ICD-10-CM | POA: Diagnosis not present

## 2015-05-22 DIAGNOSIS — R351 Nocturia: Secondary | ICD-10-CM | POA: Diagnosis not present

## 2015-05-22 DIAGNOSIS — N3020 Other chronic cystitis without hematuria: Secondary | ICD-10-CM | POA: Diagnosis not present

## 2015-05-26 DIAGNOSIS — S68114A Complete traumatic metacarpophalangeal amputation of right ring finger, initial encounter: Secondary | ICD-10-CM | POA: Diagnosis not present

## 2015-05-29 DIAGNOSIS — L988 Other specified disorders of the skin and subcutaneous tissue: Secondary | ICD-10-CM | POA: Diagnosis not present

## 2015-05-29 DIAGNOSIS — Z9889 Other specified postprocedural states: Secondary | ICD-10-CM | POA: Diagnosis not present

## 2015-05-29 DIAGNOSIS — M79641 Pain in right hand: Secondary | ICD-10-CM | POA: Diagnosis not present

## 2015-05-29 DIAGNOSIS — L608 Other nail disorders: Secondary | ICD-10-CM | POA: Diagnosis not present

## 2015-05-29 DIAGNOSIS — E538 Deficiency of other specified B group vitamins: Secondary | ICD-10-CM | POA: Diagnosis not present

## 2015-06-21 DIAGNOSIS — M5136 Other intervertebral disc degeneration, lumbar region: Secondary | ICD-10-CM | POA: Diagnosis not present

## 2015-06-22 DIAGNOSIS — E538 Deficiency of other specified B group vitamins: Secondary | ICD-10-CM | POA: Diagnosis not present

## 2015-06-24 DIAGNOSIS — J309 Allergic rhinitis, unspecified: Secondary | ICD-10-CM | POA: Diagnosis not present

## 2015-06-27 DIAGNOSIS — M25512 Pain in left shoulder: Secondary | ICD-10-CM | POA: Diagnosis not present

## 2015-07-12 DIAGNOSIS — I484 Atypical atrial flutter: Secondary | ICD-10-CM | POA: Diagnosis not present

## 2015-07-12 DIAGNOSIS — Z79899 Other long term (current) drug therapy: Secondary | ICD-10-CM | POA: Diagnosis not present

## 2015-07-12 DIAGNOSIS — I10 Essential (primary) hypertension: Secondary | ICD-10-CM | POA: Diagnosis not present

## 2015-07-12 DIAGNOSIS — I2510 Atherosclerotic heart disease of native coronary artery without angina pectoris: Secondary | ICD-10-CM | POA: Diagnosis not present

## 2015-07-12 DIAGNOSIS — Z6833 Body mass index (BMI) 33.0-33.9, adult: Secondary | ICD-10-CM | POA: Diagnosis not present

## 2015-07-25 DIAGNOSIS — Z79899 Other long term (current) drug therapy: Secondary | ICD-10-CM | POA: Diagnosis not present

## 2015-07-25 DIAGNOSIS — I483 Typical atrial flutter: Secondary | ICD-10-CM | POA: Diagnosis not present

## 2015-07-25 DIAGNOSIS — E538 Deficiency of other specified B group vitamins: Secondary | ICD-10-CM | POA: Diagnosis not present

## 2015-08-22 DIAGNOSIS — N318 Other neuromuscular dysfunction of bladder: Secondary | ICD-10-CM | POA: Diagnosis not present

## 2015-08-22 DIAGNOSIS — N3020 Other chronic cystitis without hematuria: Secondary | ICD-10-CM | POA: Diagnosis not present

## 2015-08-22 DIAGNOSIS — N401 Enlarged prostate with lower urinary tract symptoms: Secondary | ICD-10-CM | POA: Diagnosis not present

## 2015-08-22 DIAGNOSIS — R3912 Poor urinary stream: Secondary | ICD-10-CM | POA: Diagnosis not present

## 2015-08-22 DIAGNOSIS — R351 Nocturia: Secondary | ICD-10-CM | POA: Diagnosis not present

## 2015-08-22 DIAGNOSIS — N451 Epididymitis: Secondary | ICD-10-CM | POA: Diagnosis not present

## 2015-08-23 DIAGNOSIS — E538 Deficiency of other specified B group vitamins: Secondary | ICD-10-CM | POA: Diagnosis not present

## 2015-08-24 DIAGNOSIS — K219 Gastro-esophageal reflux disease without esophagitis: Secondary | ICD-10-CM | POA: Diagnosis not present

## 2015-08-24 DIAGNOSIS — R740 Nonspecific elevation of levels of transaminase and lactic acid dehydrogenase [LDH]: Secondary | ICD-10-CM | POA: Diagnosis not present

## 2015-08-29 DIAGNOSIS — R945 Abnormal results of liver function studies: Secondary | ICD-10-CM | POA: Diagnosis not present

## 2015-08-29 DIAGNOSIS — R7989 Other specified abnormal findings of blood chemistry: Secondary | ICD-10-CM | POA: Diagnosis not present

## 2015-08-29 DIAGNOSIS — R935 Abnormal findings on diagnostic imaging of other abdominal regions, including retroperitoneum: Secondary | ICD-10-CM | POA: Diagnosis not present

## 2015-08-29 DIAGNOSIS — R930 Abnormal findings on diagnostic imaging of skull and head, not elsewhere classified: Secondary | ICD-10-CM | POA: Diagnosis not present

## 2015-09-05 DIAGNOSIS — I252 Old myocardial infarction: Secondary | ICD-10-CM | POA: Diagnosis not present

## 2015-09-05 DIAGNOSIS — K449 Diaphragmatic hernia without obstruction or gangrene: Secondary | ICD-10-CM | POA: Diagnosis not present

## 2015-09-05 DIAGNOSIS — Z955 Presence of coronary angioplasty implant and graft: Secondary | ICD-10-CM | POA: Diagnosis not present

## 2015-09-05 DIAGNOSIS — K5730 Diverticulosis of large intestine without perforation or abscess without bleeding: Secondary | ICD-10-CM | POA: Diagnosis not present

## 2015-09-05 DIAGNOSIS — I10 Essential (primary) hypertension: Secondary | ICD-10-CM | POA: Diagnosis not present

## 2015-09-05 DIAGNOSIS — D126 Benign neoplasm of colon, unspecified: Secondary | ICD-10-CM | POA: Diagnosis not present

## 2015-09-05 DIAGNOSIS — Z1211 Encounter for screening for malignant neoplasm of colon: Secondary | ICD-10-CM | POA: Diagnosis not present

## 2015-09-05 DIAGNOSIS — D122 Benign neoplasm of ascending colon: Secondary | ICD-10-CM | POA: Diagnosis not present

## 2015-09-05 DIAGNOSIS — K2270 Barrett's esophagus without dysplasia: Secondary | ICD-10-CM | POA: Diagnosis not present

## 2015-09-05 DIAGNOSIS — K219 Gastro-esophageal reflux disease without esophagitis: Secondary | ICD-10-CM | POA: Diagnosis not present

## 2015-09-05 DIAGNOSIS — Z79899 Other long term (current) drug therapy: Secondary | ICD-10-CM | POA: Diagnosis not present

## 2015-09-05 DIAGNOSIS — K648 Other hemorrhoids: Secondary | ICD-10-CM | POA: Diagnosis not present

## 2015-09-05 DIAGNOSIS — K2970 Gastritis, unspecified, without bleeding: Secondary | ICD-10-CM | POA: Diagnosis not present

## 2015-09-21 DIAGNOSIS — H01009 Unspecified blepharitis unspecified eye, unspecified eyelid: Secondary | ICD-10-CM | POA: Diagnosis not present

## 2015-10-04 DIAGNOSIS — E538 Deficiency of other specified B group vitamins: Secondary | ICD-10-CM | POA: Diagnosis not present

## 2015-10-04 DIAGNOSIS — I2510 Atherosclerotic heart disease of native coronary artery without angina pectoris: Secondary | ICD-10-CM | POA: Diagnosis not present

## 2015-10-04 DIAGNOSIS — I5020 Unspecified systolic (congestive) heart failure: Secondary | ICD-10-CM | POA: Diagnosis not present

## 2015-10-04 DIAGNOSIS — I10 Essential (primary) hypertension: Secondary | ICD-10-CM | POA: Diagnosis not present

## 2015-10-04 DIAGNOSIS — N183 Chronic kidney disease, stage 3 (moderate): Secondary | ICD-10-CM | POA: Diagnosis not present

## 2015-10-04 DIAGNOSIS — I480 Paroxysmal atrial fibrillation: Secondary | ICD-10-CM | POA: Diagnosis not present

## 2015-10-04 DIAGNOSIS — N182 Chronic kidney disease, stage 2 (mild): Secondary | ICD-10-CM | POA: Diagnosis not present

## 2015-10-04 DIAGNOSIS — E785 Hyperlipidemia, unspecified: Secondary | ICD-10-CM | POA: Diagnosis not present

## 2015-10-04 DIAGNOSIS — N401 Enlarged prostate with lower urinary tract symptoms: Secondary | ICD-10-CM | POA: Diagnosis not present

## 2015-10-09 DIAGNOSIS — K5730 Diverticulosis of large intestine without perforation or abscess without bleeding: Secondary | ICD-10-CM | POA: Diagnosis not present

## 2015-10-09 DIAGNOSIS — K219 Gastro-esophageal reflux disease without esophagitis: Secondary | ICD-10-CM | POA: Diagnosis not present

## 2015-10-09 DIAGNOSIS — K2270 Barrett's esophagus without dysplasia: Secondary | ICD-10-CM | POA: Diagnosis not present

## 2015-10-18 DIAGNOSIS — I2510 Atherosclerotic heart disease of native coronary artery without angina pectoris: Secondary | ICD-10-CM | POA: Diagnosis not present

## 2015-10-18 DIAGNOSIS — E782 Mixed hyperlipidemia: Secondary | ICD-10-CM | POA: Diagnosis not present

## 2015-10-18 DIAGNOSIS — I484 Atypical atrial flutter: Secondary | ICD-10-CM | POA: Diagnosis not present

## 2015-10-18 DIAGNOSIS — I10 Essential (primary) hypertension: Secondary | ICD-10-CM | POA: Diagnosis not present

## 2015-10-18 DIAGNOSIS — Z79899 Other long term (current) drug therapy: Secondary | ICD-10-CM | POA: Diagnosis not present

## 2015-10-25 DIAGNOSIS — E538 Deficiency of other specified B group vitamins: Secondary | ICD-10-CM | POA: Diagnosis not present

## 2015-11-06 DIAGNOSIS — H43399 Other vitreous opacities, unspecified eye: Secondary | ICD-10-CM | POA: Diagnosis not present

## 2015-11-06 DIAGNOSIS — Z79899 Other long term (current) drug therapy: Secondary | ICD-10-CM | POA: Diagnosis not present

## 2015-11-06 DIAGNOSIS — H01009 Unspecified blepharitis unspecified eye, unspecified eyelid: Secondary | ICD-10-CM | POA: Diagnosis not present

## 2015-11-22 DIAGNOSIS — E538 Deficiency of other specified B group vitamins: Secondary | ICD-10-CM | POA: Diagnosis not present

## 2015-11-28 DIAGNOSIS — M5136 Other intervertebral disc degeneration, lumbar region: Secondary | ICD-10-CM | POA: Diagnosis not present

## 2015-12-22 DIAGNOSIS — E538 Deficiency of other specified B group vitamins: Secondary | ICD-10-CM | POA: Diagnosis not present

## 2015-12-27 DIAGNOSIS — Z6836 Body mass index (BMI) 36.0-36.9, adult: Secondary | ICD-10-CM | POA: Diagnosis not present

## 2015-12-27 DIAGNOSIS — I484 Atypical atrial flutter: Secondary | ICD-10-CM | POA: Diagnosis not present

## 2015-12-27 DIAGNOSIS — I2510 Atherosclerotic heart disease of native coronary artery without angina pectoris: Secondary | ICD-10-CM | POA: Diagnosis not present

## 2015-12-27 DIAGNOSIS — I10 Essential (primary) hypertension: Secondary | ICD-10-CM | POA: Diagnosis not present

## 2015-12-27 DIAGNOSIS — Z79899 Other long term (current) drug therapy: Secondary | ICD-10-CM | POA: Diagnosis not present

## 2015-12-27 DIAGNOSIS — E782 Mixed hyperlipidemia: Secondary | ICD-10-CM | POA: Diagnosis not present

## 2015-12-27 DIAGNOSIS — I471 Supraventricular tachycardia: Secondary | ICD-10-CM | POA: Diagnosis not present

## 2015-12-27 DIAGNOSIS — R208 Other disturbances of skin sensation: Secondary | ICD-10-CM | POA: Diagnosis not present

## 2015-12-27 DIAGNOSIS — R001 Bradycardia, unspecified: Secondary | ICD-10-CM | POA: Diagnosis not present

## 2015-12-28 DIAGNOSIS — R001 Bradycardia, unspecified: Secondary | ICD-10-CM | POA: Diagnosis not present

## 2016-01-17 DIAGNOSIS — G479 Sleep disorder, unspecified: Secondary | ICD-10-CM

## 2016-01-17 DIAGNOSIS — M79671 Pain in right foot: Secondary | ICD-10-CM

## 2016-01-17 DIAGNOSIS — R208 Other disturbances of skin sensation: Secondary | ICD-10-CM | POA: Insufficient documentation

## 2016-01-17 DIAGNOSIS — R0683 Snoring: Secondary | ICD-10-CM

## 2016-01-17 DIAGNOSIS — M79672 Pain in left foot: Secondary | ICD-10-CM | POA: Diagnosis not present

## 2016-01-17 HISTORY — DX: Pain in right foot: M79.671

## 2016-01-17 HISTORY — DX: Sleep disorder, unspecified: G47.9

## 2016-01-17 HISTORY — DX: Other disturbances of skin sensation: R20.8

## 2016-01-17 HISTORY — DX: Snoring: R06.83

## 2016-01-24 DIAGNOSIS — Z23 Encounter for immunization: Secondary | ICD-10-CM | POA: Diagnosis not present

## 2016-01-24 DIAGNOSIS — E538 Deficiency of other specified B group vitamins: Secondary | ICD-10-CM | POA: Diagnosis not present

## 2016-02-08 DIAGNOSIS — M5416 Radiculopathy, lumbar region: Secondary | ICD-10-CM | POA: Diagnosis not present

## 2016-02-21 DIAGNOSIS — M79672 Pain in left foot: Secondary | ICD-10-CM | POA: Diagnosis not present

## 2016-02-21 DIAGNOSIS — M79671 Pain in right foot: Secondary | ICD-10-CM | POA: Diagnosis not present

## 2016-02-21 DIAGNOSIS — Z6837 Body mass index (BMI) 37.0-37.9, adult: Secondary | ICD-10-CM | POA: Diagnosis not present

## 2016-02-21 DIAGNOSIS — G8929 Other chronic pain: Secondary | ICD-10-CM

## 2016-02-21 DIAGNOSIS — R0683 Snoring: Secondary | ICD-10-CM | POA: Diagnosis not present

## 2016-02-21 DIAGNOSIS — M5416 Radiculopathy, lumbar region: Secondary | ICD-10-CM

## 2016-02-21 DIAGNOSIS — M5440 Lumbago with sciatica, unspecified side: Secondary | ICD-10-CM

## 2016-02-21 DIAGNOSIS — G479 Sleep disorder, unspecified: Secondary | ICD-10-CM | POA: Diagnosis not present

## 2016-02-21 DIAGNOSIS — M5442 Lumbago with sciatica, left side: Secondary | ICD-10-CM | POA: Diagnosis not present

## 2016-02-21 HISTORY — DX: Radiculopathy, lumbar region: M54.16

## 2016-02-21 HISTORY — DX: Other chronic pain: G89.29

## 2016-02-26 DIAGNOSIS — E538 Deficiency of other specified B group vitamins: Secondary | ICD-10-CM | POA: Diagnosis not present

## 2016-02-27 ENCOUNTER — Ambulatory Visit: Payer: Medicare Other

## 2016-03-04 DIAGNOSIS — H524 Presbyopia: Secondary | ICD-10-CM | POA: Diagnosis not present

## 2016-03-04 DIAGNOSIS — H01009 Unspecified blepharitis unspecified eye, unspecified eyelid: Secondary | ICD-10-CM | POA: Diagnosis not present

## 2016-03-05 DIAGNOSIS — G8929 Other chronic pain: Secondary | ICD-10-CM | POA: Diagnosis not present

## 2016-03-05 DIAGNOSIS — M5136 Other intervertebral disc degeneration, lumbar region: Secondary | ICD-10-CM | POA: Diagnosis not present

## 2016-03-05 DIAGNOSIS — M5442 Lumbago with sciatica, left side: Secondary | ICD-10-CM | POA: Diagnosis not present

## 2016-03-20 DIAGNOSIS — M5136 Other intervertebral disc degeneration, lumbar region: Secondary | ICD-10-CM | POA: Diagnosis not present

## 2016-04-01 DIAGNOSIS — R740 Nonspecific elevation of levels of transaminase and lactic acid dehydrogenase [LDH]: Secondary | ICD-10-CM | POA: Diagnosis not present

## 2016-04-02 DIAGNOSIS — M5126 Other intervertebral disc displacement, lumbar region: Secondary | ICD-10-CM | POA: Diagnosis not present

## 2016-04-02 DIAGNOSIS — M5136 Other intervertebral disc degeneration, lumbar region: Secondary | ICD-10-CM | POA: Diagnosis not present

## 2016-04-03 DIAGNOSIS — R001 Bradycardia, unspecified: Secondary | ICD-10-CM | POA: Diagnosis not present

## 2016-04-03 DIAGNOSIS — I484 Atypical atrial flutter: Secondary | ICD-10-CM | POA: Diagnosis not present

## 2016-04-04 DIAGNOSIS — N401 Enlarged prostate with lower urinary tract symptoms: Secondary | ICD-10-CM | POA: Diagnosis not present

## 2016-04-04 DIAGNOSIS — I509 Heart failure, unspecified: Secondary | ICD-10-CM | POA: Diagnosis not present

## 2016-04-04 DIAGNOSIS — I2510 Atherosclerotic heart disease of native coronary artery without angina pectoris: Secondary | ICD-10-CM | POA: Diagnosis not present

## 2016-04-04 DIAGNOSIS — I4891 Unspecified atrial fibrillation: Secondary | ICD-10-CM | POA: Diagnosis not present

## 2016-04-04 DIAGNOSIS — E782 Mixed hyperlipidemia: Secondary | ICD-10-CM | POA: Diagnosis not present

## 2016-04-04 DIAGNOSIS — I10 Essential (primary) hypertension: Secondary | ICD-10-CM | POA: Diagnosis not present

## 2016-04-04 DIAGNOSIS — N183 Chronic kidney disease, stage 3 (moderate): Secondary | ICD-10-CM | POA: Diagnosis not present

## 2016-04-05 DIAGNOSIS — I484 Atypical atrial flutter: Secondary | ICD-10-CM | POA: Diagnosis not present

## 2016-04-12 DIAGNOSIS — M5416 Radiculopathy, lumbar region: Secondary | ICD-10-CM | POA: Diagnosis not present

## 2016-04-12 DIAGNOSIS — M5136 Other intervertebral disc degeneration, lumbar region: Secondary | ICD-10-CM | POA: Diagnosis not present

## 2016-04-12 DIAGNOSIS — M5117 Intervertebral disc disorders with radiculopathy, lumbosacral region: Secondary | ICD-10-CM | POA: Diagnosis not present

## 2016-04-21 DIAGNOSIS — R609 Edema, unspecified: Secondary | ICD-10-CM | POA: Diagnosis not present

## 2016-04-21 DIAGNOSIS — I6602 Occlusion and stenosis of left middle cerebral artery: Secondary | ICD-10-CM | POA: Diagnosis not present

## 2016-04-21 DIAGNOSIS — K219 Gastro-esophageal reflux disease without esophagitis: Secondary | ICD-10-CM

## 2016-04-21 DIAGNOSIS — I214 Non-ST elevation (NSTEMI) myocardial infarction: Secondary | ICD-10-CM | POA: Diagnosis not present

## 2016-04-21 DIAGNOSIS — G8929 Other chronic pain: Secondary | ICD-10-CM | POA: Diagnosis not present

## 2016-04-21 DIAGNOSIS — G4733 Obstructive sleep apnea (adult) (pediatric): Secondary | ICD-10-CM | POA: Diagnosis not present

## 2016-04-21 DIAGNOSIS — Z6837 Body mass index (BMI) 37.0-37.9, adult: Secondary | ICD-10-CM | POA: Diagnosis not present

## 2016-04-21 DIAGNOSIS — I2510 Atherosclerotic heart disease of native coronary artery without angina pectoris: Secondary | ICD-10-CM | POA: Diagnosis not present

## 2016-04-21 DIAGNOSIS — R079 Chest pain, unspecified: Secondary | ICD-10-CM | POA: Diagnosis not present

## 2016-04-21 DIAGNOSIS — M5442 Lumbago with sciatica, left side: Secondary | ICD-10-CM | POA: Diagnosis not present

## 2016-04-21 DIAGNOSIS — I471 Supraventricular tachycardia: Secondary | ICD-10-CM | POA: Diagnosis not present

## 2016-04-21 DIAGNOSIS — R0789 Other chest pain: Secondary | ICD-10-CM | POA: Diagnosis not present

## 2016-04-21 DIAGNOSIS — I4510 Unspecified right bundle-branch block: Secondary | ICD-10-CM | POA: Diagnosis not present

## 2016-04-21 HISTORY — DX: Gastro-esophageal reflux disease without esophagitis: K21.9

## 2016-04-22 DIAGNOSIS — Z6837 Body mass index (BMI) 37.0-37.9, adult: Secondary | ICD-10-CM | POA: Diagnosis not present

## 2016-04-22 DIAGNOSIS — R0789 Other chest pain: Secondary | ICD-10-CM | POA: Diagnosis not present

## 2016-04-22 DIAGNOSIS — I493 Ventricular premature depolarization: Secondary | ICD-10-CM | POA: Insufficient documentation

## 2016-04-22 DIAGNOSIS — I10 Essential (primary) hypertension: Secondary | ICD-10-CM | POA: Diagnosis not present

## 2016-04-22 HISTORY — DX: Ventricular premature depolarization: I49.3

## 2016-04-23 DIAGNOSIS — N182 Chronic kidney disease, stage 2 (mild): Secondary | ICD-10-CM | POA: Diagnosis not present

## 2016-04-23 DIAGNOSIS — I10 Essential (primary) hypertension: Secondary | ICD-10-CM | POA: Diagnosis not present

## 2016-04-23 DIAGNOSIS — I2510 Atherosclerotic heart disease of native coronary artery without angina pectoris: Secondary | ICD-10-CM | POA: Diagnosis not present

## 2016-04-23 HISTORY — PX: CARDIAC CATHETERIZATION: SHX172

## 2016-04-24 DIAGNOSIS — I493 Ventricular premature depolarization: Secondary | ICD-10-CM | POA: Diagnosis present

## 2016-04-24 DIAGNOSIS — Z7982 Long term (current) use of aspirin: Secondary | ICD-10-CM | POA: Diagnosis not present

## 2016-04-24 DIAGNOSIS — I129 Hypertensive chronic kidney disease with stage 1 through stage 4 chronic kidney disease, or unspecified chronic kidney disease: Secondary | ICD-10-CM | POA: Diagnosis present

## 2016-04-24 DIAGNOSIS — Z6837 Body mass index (BMI) 37.0-37.9, adult: Secondary | ICD-10-CM | POA: Diagnosis not present

## 2016-04-24 DIAGNOSIS — G8929 Other chronic pain: Secondary | ICD-10-CM | POA: Diagnosis present

## 2016-04-24 DIAGNOSIS — N182 Chronic kidney disease, stage 2 (mild): Secondary | ICD-10-CM | POA: Diagnosis present

## 2016-04-24 DIAGNOSIS — I4510 Unspecified right bundle-branch block: Secondary | ICD-10-CM | POA: Diagnosis present

## 2016-04-24 DIAGNOSIS — R079 Chest pain, unspecified: Secondary | ICD-10-CM | POA: Diagnosis not present

## 2016-04-24 DIAGNOSIS — I214 Non-ST elevation (NSTEMI) myocardial infarction: Secondary | ICD-10-CM

## 2016-04-24 DIAGNOSIS — K219 Gastro-esophageal reflux disease without esophagitis: Secondary | ICD-10-CM | POA: Diagnosis present

## 2016-04-24 DIAGNOSIS — E785 Hyperlipidemia, unspecified: Secondary | ICD-10-CM | POA: Diagnosis present

## 2016-04-24 DIAGNOSIS — Z87891 Personal history of nicotine dependence: Secondary | ICD-10-CM | POA: Diagnosis not present

## 2016-04-24 DIAGNOSIS — I252 Old myocardial infarction: Secondary | ICD-10-CM | POA: Diagnosis not present

## 2016-04-24 DIAGNOSIS — I6602 Occlusion and stenosis of left middle cerebral artery: Secondary | ICD-10-CM | POA: Diagnosis present

## 2016-04-24 DIAGNOSIS — G4733 Obstructive sleep apnea (adult) (pediatric): Secondary | ICD-10-CM | POA: Diagnosis present

## 2016-04-24 DIAGNOSIS — M5442 Lumbago with sciatica, left side: Secondary | ICD-10-CM | POA: Diagnosis present

## 2016-04-24 DIAGNOSIS — I2510 Atherosclerotic heart disease of native coronary artery without angina pectoris: Secondary | ICD-10-CM | POA: Diagnosis not present

## 2016-04-24 DIAGNOSIS — I471 Supraventricular tachycardia: Secondary | ICD-10-CM | POA: Diagnosis present

## 2016-04-24 DIAGNOSIS — M5416 Radiculopathy, lumbar region: Secondary | ICD-10-CM | POA: Diagnosis present

## 2016-04-24 DIAGNOSIS — Z955 Presence of coronary angioplasty implant and graft: Secondary | ICD-10-CM | POA: Diagnosis not present

## 2016-04-24 HISTORY — DX: Non-ST elevation (NSTEMI) myocardial infarction: I21.4

## 2016-04-25 DIAGNOSIS — M5126 Other intervertebral disc displacement, lumbar region: Secondary | ICD-10-CM | POA: Diagnosis not present

## 2016-04-25 DIAGNOSIS — M5136 Other intervertebral disc degeneration, lumbar region: Secondary | ICD-10-CM | POA: Diagnosis not present

## 2016-05-17 DIAGNOSIS — M5136 Other intervertebral disc degeneration, lumbar region: Secondary | ICD-10-CM | POA: Diagnosis not present

## 2016-05-20 DIAGNOSIS — I2510 Atherosclerotic heart disease of native coronary artery without angina pectoris: Secondary | ICD-10-CM | POA: Diagnosis not present

## 2016-05-20 DIAGNOSIS — I10 Essential (primary) hypertension: Secondary | ICD-10-CM | POA: Diagnosis not present

## 2016-05-20 DIAGNOSIS — Z6837 Body mass index (BMI) 37.0-37.9, adult: Secondary | ICD-10-CM | POA: Diagnosis not present

## 2016-05-20 DIAGNOSIS — I484 Atypical atrial flutter: Secondary | ICD-10-CM | POA: Diagnosis not present

## 2016-05-20 DIAGNOSIS — E782 Mixed hyperlipidemia: Secondary | ICD-10-CM | POA: Diagnosis not present

## 2016-05-31 DIAGNOSIS — G8929 Other chronic pain: Secondary | ICD-10-CM | POA: Diagnosis not present

## 2016-05-31 DIAGNOSIS — M5442 Lumbago with sciatica, left side: Secondary | ICD-10-CM | POA: Diagnosis not present

## 2016-05-31 DIAGNOSIS — M5126 Other intervertebral disc displacement, lumbar region: Secondary | ICD-10-CM | POA: Diagnosis not present

## 2016-06-07 DIAGNOSIS — N644 Mastodynia: Secondary | ICD-10-CM | POA: Diagnosis not present

## 2016-06-14 DIAGNOSIS — Z6838 Body mass index (BMI) 38.0-38.9, adult: Secondary | ICD-10-CM | POA: Diagnosis not present

## 2016-06-14 DIAGNOSIS — M5136 Other intervertebral disc degeneration, lumbar region: Secondary | ICD-10-CM | POA: Diagnosis not present

## 2016-06-14 DIAGNOSIS — N644 Mastodynia: Secondary | ICD-10-CM | POA: Diagnosis not present

## 2016-06-19 DIAGNOSIS — N62 Hypertrophy of breast: Secondary | ICD-10-CM | POA: Diagnosis not present

## 2016-06-19 DIAGNOSIS — N644 Mastodynia: Secondary | ICD-10-CM | POA: Diagnosis not present

## 2016-08-20 DIAGNOSIS — N3020 Other chronic cystitis without hematuria: Secondary | ICD-10-CM | POA: Diagnosis not present

## 2016-08-20 DIAGNOSIS — N401 Enlarged prostate with lower urinary tract symptoms: Secondary | ICD-10-CM | POA: Diagnosis not present

## 2016-08-20 DIAGNOSIS — N318 Other neuromuscular dysfunction of bladder: Secondary | ICD-10-CM | POA: Diagnosis not present

## 2016-08-20 DIAGNOSIS — R351 Nocturia: Secondary | ICD-10-CM | POA: Diagnosis not present

## 2016-08-21 DIAGNOSIS — N401 Enlarged prostate with lower urinary tract symptoms: Secondary | ICD-10-CM | POA: Diagnosis not present

## 2016-08-28 DIAGNOSIS — I2510 Atherosclerotic heart disease of native coronary artery without angina pectoris: Secondary | ICD-10-CM | POA: Diagnosis not present

## 2016-08-28 DIAGNOSIS — E785 Hyperlipidemia, unspecified: Secondary | ICD-10-CM | POA: Diagnosis not present

## 2016-08-28 DIAGNOSIS — Z6835 Body mass index (BMI) 35.0-35.9, adult: Secondary | ICD-10-CM | POA: Diagnosis not present

## 2016-09-05 DIAGNOSIS — K5730 Diverticulosis of large intestine without perforation or abscess without bleeding: Secondary | ICD-10-CM | POA: Diagnosis not present

## 2016-09-05 DIAGNOSIS — K219 Gastro-esophageal reflux disease without esophagitis: Secondary | ICD-10-CM | POA: Diagnosis not present

## 2016-09-05 DIAGNOSIS — K2270 Barrett's esophagus without dysplasia: Secondary | ICD-10-CM | POA: Diagnosis not present

## 2016-09-09 DIAGNOSIS — H5203 Hypermetropia, bilateral: Secondary | ICD-10-CM | POA: Diagnosis not present

## 2016-09-09 DIAGNOSIS — H01001 Unspecified blepharitis right upper eyelid: Secondary | ICD-10-CM | POA: Diagnosis not present

## 2016-09-09 DIAGNOSIS — H52223 Regular astigmatism, bilateral: Secondary | ICD-10-CM | POA: Diagnosis not present

## 2016-09-09 DIAGNOSIS — H524 Presbyopia: Secondary | ICD-10-CM | POA: Diagnosis not present

## 2016-09-26 DIAGNOSIS — M170 Bilateral primary osteoarthritis of knee: Secondary | ICD-10-CM | POA: Diagnosis not present

## 2016-09-26 DIAGNOSIS — M1712 Unilateral primary osteoarthritis, left knee: Secondary | ICD-10-CM | POA: Diagnosis not present

## 2016-09-26 DIAGNOSIS — M1711 Unilateral primary osteoarthritis, right knee: Secondary | ICD-10-CM | POA: Diagnosis not present

## 2016-10-08 DIAGNOSIS — I2510 Atherosclerotic heart disease of native coronary artery without angina pectoris: Secondary | ICD-10-CM | POA: Diagnosis not present

## 2016-10-08 DIAGNOSIS — E782 Mixed hyperlipidemia: Secondary | ICD-10-CM | POA: Diagnosis not present

## 2016-10-08 DIAGNOSIS — N401 Enlarged prostate with lower urinary tract symptoms: Secondary | ICD-10-CM | POA: Diagnosis not present

## 2016-10-08 DIAGNOSIS — I110 Hypertensive heart disease with heart failure: Secondary | ICD-10-CM | POA: Diagnosis not present

## 2016-10-08 DIAGNOSIS — I509 Heart failure, unspecified: Secondary | ICD-10-CM | POA: Diagnosis not present

## 2016-10-08 DIAGNOSIS — I10 Essential (primary) hypertension: Secondary | ICD-10-CM | POA: Diagnosis not present

## 2016-10-08 DIAGNOSIS — N183 Chronic kidney disease, stage 3 (moderate): Secondary | ICD-10-CM | POA: Diagnosis not present

## 2016-10-08 DIAGNOSIS — I4891 Unspecified atrial fibrillation: Secondary | ICD-10-CM | POA: Diagnosis not present

## 2016-10-15 DIAGNOSIS — C44319 Basal cell carcinoma of skin of other parts of face: Secondary | ICD-10-CM | POA: Diagnosis not present

## 2016-10-15 DIAGNOSIS — L988 Other specified disorders of the skin and subcutaneous tissue: Secondary | ICD-10-CM | POA: Diagnosis not present

## 2016-10-15 DIAGNOSIS — L720 Epidermal cyst: Secondary | ICD-10-CM | POA: Diagnosis not present

## 2016-11-06 DIAGNOSIS — S40012A Contusion of left shoulder, initial encounter: Secondary | ICD-10-CM | POA: Diagnosis not present

## 2016-12-02 ENCOUNTER — Other Ambulatory Visit: Payer: Self-pay

## 2016-12-02 MED ORDER — ISOSORBIDE MONONITRATE ER 60 MG PO TB24: 60.0000 mg | ORAL_TABLET | Freq: Every day | ORAL | 0 refills | Status: DC

## 2016-12-02 MED ORDER — FUROSEMIDE 40 MG PO TABS: 40.0000 mg | ORAL_TABLET | ORAL | 0 refills | Status: DC

## 2016-12-02 MED ORDER — SIMVASTATIN 40 MG PO TABS: 40.0000 mg | ORAL_TABLET | Freq: Every day | ORAL | 0 refills | Status: DC

## 2016-12-02 MED ORDER — METOPROLOL SUCCINATE ER 50 MG PO TB24: 50.0000 mg | ORAL_TABLET | Freq: Two times a day (BID) | ORAL | 0 refills | Status: DC

## 2016-12-02 MED ORDER — METOPROLOL SUCCINATE ER 25 MG PO TB24: 25.0000 mg | ORAL_TABLET | Freq: Every day | ORAL | 0 refills | Status: DC

## 2016-12-02 NOTE — Telephone Encounter (Signed)
Patient confirmed that he will be following Dr. Bettina Gavia in [place]. Not due for office visit until November. He states his wife will call to schedule an appointment as she keeps up with his appointments. Advised refills will be sent to get him through to November.

## 2016-12-20 DIAGNOSIS — K219 Gastro-esophageal reflux disease without esophagitis: Secondary | ICD-10-CM | POA: Insufficient documentation

## 2016-12-20 DIAGNOSIS — I110 Hypertensive heart disease with heart failure: Secondary | ICD-10-CM | POA: Insufficient documentation

## 2016-12-20 DIAGNOSIS — M1990 Unspecified osteoarthritis, unspecified site: Secondary | ICD-10-CM | POA: Insufficient documentation

## 2016-12-20 HISTORY — DX: Hypertensive heart disease with heart failure: I11.0

## 2017-01-22 ENCOUNTER — Encounter: Payer: Self-pay | Admitting: Cardiology

## 2017-01-22 ENCOUNTER — Ambulatory Visit (INDEPENDENT_AMBULATORY_CARE_PROVIDER_SITE_OTHER): Payer: Medicare Other | Admitting: Cardiology

## 2017-01-22 VITALS — BP 102/60 | HR 96 | Ht 67.0 in | Wt 221.4 lb

## 2017-01-22 DIAGNOSIS — I10 Essential (primary) hypertension: Secondary | ICD-10-CM

## 2017-01-22 DIAGNOSIS — E785 Hyperlipidemia, unspecified: Secondary | ICD-10-CM

## 2017-01-22 DIAGNOSIS — I2510 Atherosclerotic heart disease of native coronary artery without angina pectoris: Secondary | ICD-10-CM | POA: Diagnosis not present

## 2017-01-22 DIAGNOSIS — I484 Atypical atrial flutter: Secondary | ICD-10-CM

## 2017-01-22 NOTE — Patient Instructions (Signed)
Medication Instructions:  Your physician recommends that you continue on your current medications as directed. Please refer to the Current Medication list given to you today.  Labwork: Your physician recommends that you have lab work today to check your cholesterol and Kidney function as well as sodium level and potassium.   Testing/Procedures: Your physician has requested that you have a lexiscan myoview. For further information please visit [email or web address]. Please follow instruction sheet, as given.   Follow-Up: Your physician wants you to follow-up in:6 months You will receive a reminder letter in the mail two months in advance. If you don't receive a letter, please call our office to schedule the follow-up appointment. Any Other Special Instructions Will Be Listed Below (If Applicable).  Please note that any paperwork needing to be filled out by the provider will need to be addressed at the front desk prior to seeing the provider. Please note that any paperwork FMLA, Disability or other documents regarding health condition is subject to a $25.00 charge that must be received prior to completion of paperwork in the form of a money order or check.    If you need a refill on your cardiac medications before your next appointment, please call your pharmacy.

## 2017-01-22 NOTE — Progress Notes (Signed)
Cardiology Office Note:    Date:  01/22/2017   ID:  Andrew [facility], DOB Oct 24, 1940, MRN [id number]  PCP:  Lillard Anes, MD  Cardiologist:  Shirlee More, MD    Referring MD: Lillard Anes,*    ASSESSMENT:    1. CAD, multiple vessel   2. Essential hypertension   3. Hyperlipidemia, unspecified hyperlipidemia type   4. Atypical atrial flutter (HCC)    PLAN:    In order of problems listed above:  1. Stable continue current treatment myocardial perfusion study prior to surgery 2. Stable BP at target continue current treatment 3. Stable continue statin check liver function and lipid profile on current intermediate intensity statin 4. Stable no clinical recurrence he is off amiodarone   Next appointment: 6 months   Medication Adjustments/Labs and Tests Ordered: Current medicines are reviewed at length with the patient today.  Concerns regarding medicines are outlined above.  No orders of the defined types were placed in this encounter.  No orders of the defined types were placed in this encounter.   Chief Complaint  Patient presents with  . Follow-up    Routine flup appt     History of Present Illness:    Andrew Hardy is a 76 y.o. 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 Arthritis   . Atypical atrial flutter ([place]) 12/14/2014  . Bradycardia, sinus 10/15/2014  . Burning sensation of feet 01/17/2016  . CAD, multiple vessel 10/18/2014   Overview:  DES to [facility] for unstable angina 10/17/14 Diagnostic Summary Severe stenosis of mid Cx likely cause of recent accelerating angina Diffuse severe stenosis of small caliber LAD unchanged from 2012 Normal LV function Paroxysmal SVT noted during procedure 04/23/16:  PCI / Resolute Drug Eluting Stent of the proximal Left Anterior Descending Coronary Artery. Successful PTCA of the ostial 1st Diagonal Coronary Artery  . 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 Coronary artery disease 2003   s/p MI and numerous stents  . Enlarged prostate without lower urinary tract symptoms (luts) 2012   frequent urination and nocturia; sees dr [name] Late in [place]  . Essential hypertension 10/15/2014  . Gastroesophageal reflux disease without esophagitis 04/21/2016  . GERD (gastroesophageal reflux disease)   . Hyperlipidemia   . Hypertension   . IHD (ischemic heart disease) 10/15/2014  . Lumbar radiculopathy 02/21/2016  . Myocardial infarction ([place]) 2003  . NSTEMI (non-ST elevated myocardial infarction) ([place]) 04/24/2016   Overview:  LHC 04/24/15: Severe stenosis of the LAD & Diagonal bifurcation  LV ejection fraction is 55-60 %  Interventional Summary Successful Angiosculpt PCI / Resolute Drug Eluting Stent of the proximal LAD  Successful PTCA of the ostial 1st Diagonal Coronary Artery  . Pain in both feet 01/17/2016  . Paroxysmal SVT (supraventricular tachycardia) ([place]) 11/16/2014  . Precordial pain 10/15/2014  . PVC's (premature ventricular contractions) 04/22/2016  . RBBB 10/15/2014  . Sleep disturbances 01/17/2016  . Snoring 01/17/2016  .  Subscapularis (muscle) sprain 03/29/2011  . 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 CARDIAC CATHETERIZATION  10-2010   ptca/stent in [place]  . CARDIAC CATHETERIZATION  04/23/2016  . CORONARY ANGIOPLASTY    . EYE SURGERY  05-1010   bil cataract ,iol  . FRACTURE SURGERY    . I&D EXTREMITY Right 02/23/2015   Procedure: RIGHT RING FINGER REVISION AMPUTATION;  Surgeon: Leanora Cover, MD;  Location: [facility];  Service: Orthopedics;  Laterality: Right;  . KNEE ARTHROSCOPY W/ MENISCAL REPAIR  2000   left knee  . LUMBAR EPIDURAL INJECTION  2011   low back pain; DDD  . SHOULDER ARTHROSCOPY  03/29/2011   Procedure: ARTHROSCOPY SHOULDER;  Surgeon: Augustin Schooling;  Location: [facility];  Service: Orthopedics;  Laterality: Right;  RIGHT SHOULDER ARTHROSCOPY WITH OPEN SUBSCAPULAR REPAIR  . SHOULDER OPEN ROTATOR CUFF REPAIR     3 on rt shoulder; 2 on left    Current Medications: Current Meds  Medication Sig  . aspirin 81 MG tablet Take 81 mg by mouth daily.    . clopidogrel (PLAVIX) 75 MG tablet Take 75 mg by mouth daily.    Marland Kitchen Co-Enzyme Q-10 30 MG CAPS Take 100 mg by mouth daily.  Marland Kitchen doxycycline (VIBRAMYCIN) 50 MG capsule Take 2 capsules (100 mg total) by mouth 2 (two) times daily.  Marland Kitchen EPINEPHrine (EPIPEN 2-PAK) 0.3 mg/0.3 mL IJ SOAJ injection   . esomeprazole (NEXIUM) 40 MG capsule Take 40 mg by mouth daily before breakfast.    . ezetimibe (ZETIA) 10 MG tablet Take 10 mg by mouth daily.  . finasteride (PROSCAR) 5 MG tablet Take 5 mg by mouth daily.  . furosemide (LASIX) 40 MG tablet Take 1-2 tablets (40-80 mg total) by mouth See admin instructions. Alternate taking  40mg  one day and 80mg  another day  . isosorbide mononitrate (IMDUR) 60 MG 24 hr tablet Take 1 tablet (60 mg total) by mouth daily.  . magnesium oxide (MAG-OX) 400 MG tablet Take 400 mg by mouth daily.  . metoprolol succinate (TOPROL-XL) 25 MG 24 hr tablet Take 1 tablet (25  mg total) by mouth daily.  . Multiple Vitamins-Minerals (MULTIVITAMINS THER. W/MINERALS) TABS Take 1 tablet by mouth daily.    . nitroGLYCERIN (NITROSTAT) 0.4 MG SL tablet TAKE 1 TABLET UNDER THE TONGUE AS NEEDED AS DIRECTED FOR CHEST PAIN.  Marland Kitchen Omega-3 Fatty Acids (FISH OIL PO) Take 1 tablet by mouth daily.  . potassium chloride SA (K-DUR,KLOR-CON) 20 MEQ tablet Take 20 mEq by mouth 2 (two) times daily.   . Saw Palmetto 160 MG CAPS Take 1 capsule by mouth daily.  . simvastatin (ZOCOR) 40 MG tablet Take 1 tablet (40 mg total) by mouth at bedtime.  . tamsulosin (FLOMAX) 0.4 MG CAPS capsule Take 0.4 mg by mouth daily.  . vitamin E 400 UNIT capsule Take 400 Units by mouth daily.     Allergies:   Buprenorphine hcl; Hydrocodone-acetaminophen; Morphine; Morphine and related; Oxycodone-acetaminophen; Percocet [oxycodone-acetaminophen]; Codeine; and Hydrocodone   Social History   Social History  . Marital status: Married    Spouse name: N/A  . Number of  children: N/A  . Years of education: N/A   Social History Main Topics  . Smoking status: Former Smoker    Packs/day: 1.00    Years: 20.00    Types: Cigarettes    Quit date: 03/20/1994  . Smokeless tobacco: Former Systems developer    Types: Parkin date: 03/20/1996  . Alcohol use 3.6 oz/week    6 Cans of beer per week  . Drug use: No  . Sexual activity: Not Asked   Other Topics Concern  . None   Social History Narrative  . None     Family History: The patient's family history includes Cancer in his father and sister; Heart disease in his father and mother. ROS:   Please see the history of present illness.    All other systems reviewed and are negative.  EKGs/Labs/Other Studies Reviewed:    The following studies were reviewed today:  Recent Labs: 08/28/16 CMP normal No results found for requested labs within last 8760 hours.  Recent Lipid Panel 08/28/16: Chol 136, HDL 36, LDL 45 No results found for: CHOL, TRIG, HDL, CHOLHDL, VLDL,  LDLCALC, LDLDIRECT  Physical Exam:    VS:  BP 102/60 (BP Location: Right Arm, Patient Position: Sitting)   Pulse 96   Ht 5\' 7"  (1.702 m)   Wt 221 lb 6.4 oz (100.4 kg)   SpO2 (!) 56%   BMI 34.68 kg/m     Wt Readings from Last 3 Encounters:  01/22/17 221 lb 6.4 oz (100.4 kg)  03/28/11 205 lb (93 kg)  03/21/11 205 lb 0.4 oz (93 kg)     GEN:  Well nourished, well developed in no acute distress HEENT: Normal NECK: No JVD; No carotid bruits LYMPHATICS: No lymphadenopathy CARDIAC: RRR, no murmurs, rubs, gallops RESPIRATORY:  Clear to auscultation without rales, wheezing or rhonchi  ABDOMEN: Soft, non-tender, non-distended MUSCULOSKELETAL:  No edema; No deformity  SKIN: Warm and dry NEUROLOGIC:  Alert and oriented x 3 PSYCHIATRIC:  Normal affect    Signed, Shirlee More, MD  01/22/2017 8:29 AM    [facility]

## 2017-01-23 ENCOUNTER — Telehealth (HOSPITAL_COMMUNITY): Payer: Self-pay | Admitting: *Deleted

## 2017-01-23 LAB — LIPID PANEL
CHOL/HDL RATIO: 3.9 ratio (ref 0.0–5.0)
Cholesterol, Total: 163 mg/dL (ref 100–199)
HDL: 42 mg/dL (ref >39)
TRIGLYCERIDES: 499 mg/dL — AB (ref 0–149)

## 2017-01-23 LAB — COMPREHENSIVE METABOLIC PANEL
ALT: 13 IU/L (ref 0–44)
AST: 19 IU/L (ref 0–40)
Albumin/Globulin Ratio: 1.6 (ref 1.2–2.2)
Albumin: 4.1 g/dL (ref 3.5–4.8)
Alkaline Phosphatase: 59 IU/L (ref 39–117)
BUN/Creatinine Ratio: 17 (ref 10–24)
BUN: 18 mg/dL (ref 8–27)
Bilirubin Total: 0.2 mg/dL (ref 0.0–1.2)
CALCIUM: 9.3 mg/dL (ref 8.6–10.2)
CO2: 25 mmol/L (ref 20–29)
CREATININE: 1.04 mg/dL (ref 0.76–1.27)
Chloride: 101 mmol/L (ref 96–106)
GFR calc Af Amer: 81 mL/min/{1.73_m2} (ref >59)
GFR, EST NON AFRICAN AMERICAN: 70 mL/min/{1.73_m2} (ref >59)
GLOBULIN, TOTAL: 2.5 g/dL (ref 1.5–4.5)
Glucose: 130 mg/dL — ABNORMAL HIGH (ref 65–99)
Potassium: 5 mmol/L (ref 3.5–5.2)
SODIUM: 140 mmol/L (ref 134–144)
TOTAL PROTEIN: 6.6 g/dL (ref 6.0–8.5)

## 2017-01-23 NOTE — Telephone Encounter (Signed)
Attempted to call patient regarding upcoming appointment- no answer.  [facility]  

## 2017-01-27 ENCOUNTER — Telehealth (HOSPITAL_COMMUNITY): Payer: Self-pay | Admitting: *Deleted

## 2017-01-27 NOTE — Telephone Encounter (Signed)
Attempted to call regarding upcoming appointment- no answer.  Andrew [facility]  

## 2017-01-28 ENCOUNTER — Ambulatory Visit (HOSPITAL_COMMUNITY): Payer: Medicare Other | Attending: Internal Medicine

## 2017-01-28 DIAGNOSIS — E785 Hyperlipidemia, unspecified: Secondary | ICD-10-CM | POA: Diagnosis not present

## 2017-01-28 DIAGNOSIS — R9439 Abnormal result of other cardiovascular function study: Secondary | ICD-10-CM | POA: Insufficient documentation

## 2017-01-28 DIAGNOSIS — I10 Essential (primary) hypertension: Secondary | ICD-10-CM | POA: Insufficient documentation

## 2017-01-28 DIAGNOSIS — I4510 Unspecified right bundle-branch block: Secondary | ICD-10-CM | POA: Insufficient documentation

## 2017-01-28 DIAGNOSIS — I2510 Atherosclerotic heart disease of native coronary artery without angina pectoris: Secondary | ICD-10-CM | POA: Diagnosis not present

## 2017-01-28 DIAGNOSIS — I4892 Unspecified atrial flutter: Secondary | ICD-10-CM | POA: Insufficient documentation

## 2017-01-28 LAB — MYOCARDIAL PERFUSION IMAGING
CHL CUP RESTING HR STRESS: 45 {beats}/min
LVDIAVOL: 113 mL (ref 62–150)
LVSYSVOL: 44 mL
NUC STRESS TID: 0.9
Peak HR: 60 {beats}/min
RATE: 0.36
SDS: 1
SRS: 0
SSS: 1

## 2017-01-28 MED ORDER — TECHNETIUM TC 99M TETROFOSMIN IV KIT
10.6000 | PACK | Freq: Once | INTRAVENOUS | Status: AC | PRN
  Administered 2017-01-28: 10.6 via INTRAVENOUS
  Filled 2017-01-28: qty 11

## 2017-01-28 MED ORDER — REGADENOSON 0.4 MG/5ML IV SOLN
0.4000 mg | Freq: Once | INTRAVENOUS | Status: AC
  Administered 2017-01-28: 0.4 mg via INTRAVENOUS

## 2017-01-28 MED ORDER — TECHNETIUM TC 99M TETROFOSMIN IV KIT
32.6000 | PACK | Freq: Once | INTRAVENOUS | Status: AC | PRN
  Administered 2017-01-28: 32.6 via INTRAVENOUS
  Filled 2017-01-28: qty 33

## 2017-01-30 DIAGNOSIS — M5442 Lumbago with sciatica, left side: Secondary | ICD-10-CM | POA: Diagnosis not present

## 2017-01-30 DIAGNOSIS — G8929 Other chronic pain: Secondary | ICD-10-CM | POA: Diagnosis not present

## 2017-01-31 DIAGNOSIS — G8929 Other chronic pain: Secondary | ICD-10-CM | POA: Diagnosis not present

## 2017-01-31 DIAGNOSIS — M5442 Lumbago with sciatica, left side: Secondary | ICD-10-CM | POA: Diagnosis not present

## 2017-02-05 ENCOUNTER — Other Ambulatory Visit: Payer: Self-pay

## 2017-02-05 MED ORDER — METOPROLOL SUCCINATE ER 25 MG PO TB24: 25.0000 mg | ORAL_TABLET | Freq: Every day | ORAL | 3 refills | Status: DC

## 2017-02-05 MED ORDER — ISOSORBIDE MONONITRATE ER 60 MG PO TB24: 60.0000 mg | ORAL_TABLET | Freq: Every day | ORAL | 3 refills | Status: DC

## 2017-02-12 ENCOUNTER — Telehealth: Payer: Self-pay

## 2017-02-12 DIAGNOSIS — I25119 Atherosclerotic heart disease of native coronary artery with unspecified angina pectoris: Secondary | ICD-10-CM

## 2017-02-12 NOTE — Telephone Encounter (Signed)
Patient advised to stop furosemide per Dr. Bettina Gavia. Patient states that he only takes furosemide every few days. Advised patient to stop taking furosemide completely and have BMP checked in 2 weeks. Patient verbalized understanding.

## 2017-02-13 ENCOUNTER — Telehealth: Payer: Self-pay | Admitting: Cardiology

## 2017-02-13 NOTE — Telephone Encounter (Signed)
Supposed to be off Plavix for 5 days, supposed to stop Monday, resume after injection which was scheduled for tomorrow. Surgical center unable to perform tomorrow due to some reimbursement issues. Epidural moved to office Tuesday, which will be an additional 3 days. Total of 8 days off Plavix. Is this okay?

## 2017-02-13 NOTE — Telephone Encounter (Signed)
Wants to request an extension for surgery clearance-they want him off his plavix for an additional 3 days fax to [phone]

## 2017-02-13 NOTE — Telephone Encounter (Signed)
yes

## 2017-02-13 NOTE — Telephone Encounter (Signed)
Letter faxed.

## 2017-02-18 DIAGNOSIS — M5136 Other intervertebral disc degeneration, lumbar region: Secondary | ICD-10-CM | POA: Diagnosis not present

## 2017-02-18 DIAGNOSIS — M5442 Lumbago with sciatica, left side: Secondary | ICD-10-CM | POA: Diagnosis not present

## 2017-02-24 ENCOUNTER — Other Ambulatory Visit: Payer: Self-pay

## 2017-02-24 DIAGNOSIS — Z23 Encounter for immunization: Secondary | ICD-10-CM | POA: Diagnosis not present

## 2017-02-24 MED ORDER — POTASSIUM CHLORIDE CRYS ER 20 MEQ PO TBCR: 20.0000 meq | EXTENDED_RELEASE_TABLET | Freq: Two times a day (BID) | ORAL | 3 refills | Status: DC

## 2017-02-24 MED ORDER — SIMVASTATIN 40 MG PO TABS: 40.0000 mg | ORAL_TABLET | Freq: Every day | ORAL | 3 refills | Status: DC

## 2017-02-27 NOTE — Telephone Encounter (Signed)
Error

## 2017-03-10 DIAGNOSIS — G8929 Other chronic pain: Secondary | ICD-10-CM | POA: Diagnosis not present

## 2017-03-10 DIAGNOSIS — M5442 Lumbago with sciatica, left side: Secondary | ICD-10-CM | POA: Diagnosis not present

## 2017-03-17 DIAGNOSIS — I110 Hypertensive heart disease with heart failure: Secondary | ICD-10-CM | POA: Diagnosis not present

## 2017-03-17 DIAGNOSIS — Z01818 Encounter for other preprocedural examination: Secondary | ICD-10-CM | POA: Diagnosis not present

## 2017-03-17 DIAGNOSIS — N183 Chronic kidney disease, stage 3 (moderate): Secondary | ICD-10-CM | POA: Diagnosis not present

## 2017-03-17 DIAGNOSIS — I2510 Atherosclerotic heart disease of native coronary artery without angina pectoris: Secondary | ICD-10-CM | POA: Diagnosis not present

## 2017-03-17 DIAGNOSIS — E782 Mixed hyperlipidemia: Secondary | ICD-10-CM | POA: Diagnosis not present

## 2017-03-17 DIAGNOSIS — I10 Essential (primary) hypertension: Secondary | ICD-10-CM | POA: Diagnosis not present

## 2017-03-17 DIAGNOSIS — M5136 Other intervertebral disc degeneration, lumbar region: Secondary | ICD-10-CM | POA: Diagnosis not present

## 2017-04-02 ENCOUNTER — Other Ambulatory Visit: Payer: Self-pay | Admitting: *Deleted

## 2017-04-02 MED ORDER — NITROGLYCERIN 0.4 MG SL SUBL: SUBLINGUAL_TABLET | SUBLINGUAL | 1 refills | Status: DC

## 2017-04-17 NOTE — Pre-Procedure Instructions (Signed)
Andrew Hardy  04/17/2017      [facility] - [place], [place] - [address] [place] Andrew Hardy [address] Phone: [phone] Fax: [phone]    Your procedure is scheduled on 04/24/2017.  Report to [facility] Admitting at [place].M.  Call this number if you have problems the morning of surgery:  [phone]   Remember:  Do not eat food or drink liquids after midnight.  Take these medicines the morning of surgery with A SIP OF WATER: Esomeprazole (Nexium) Finasteride (Proscar) Isosorbide mononitrate (Imdur) Metoprolol succinate (Toprol-XL) tamsulosin (Flomax)  7 days prior to surgery STOP taking any Aleve, Naproxen, Ibuprofen, Motrin, Advil, Goody's, BC's, all herbal medications, fish oil, and all vitamins.  Follow your doctors instructions regarding your Aspirin and Plavix.  If no instructions were given by your doctor, then you will need to call the prescribing office office to get instructions.      Do not wear jewelry, make-up or nail polish.  Do not wear lotions, powders, or perfumes, or deodorant.  Men may shave face and neck.  Do not bring valuables to the hospital.  [facility] is not responsible for any belongings or valuables.  Contacts, eyeglasses, dentures or bridgework may not be worn into surgery.  Leave your suitcase in the car.  After surgery it may be brought to your room.  For patients admitted to the hospital, discharge time will be determined by your treatment team.  Patients discharged the day of surgery will not be allowed to drive home.   Name and phone number of your driver:   Special instructions:   Andrew Hardy- Preparing For Surgery  Before surgery, you can play an important role. Because skin is not sterile, your skin needs to be as free of germs as possible. You can reduce the number of germs on your skin by washing with CHG (chlorahexidine gluconate) Soap before surgery.  CHG is an antiseptic cleaner  which kills germs and bonds with the skin to continue killing germs even after washing.  Please do not use if you have an allergy to CHG or antibacterial soaps. If your skin becomes reddened/irritated stop using the CHG.  Do not shave (including legs and underarms) for at least 48 hours prior to first CHG shower. It is OK to shave your face.  Please follow these instructions carefully.   1. Shower the NIGHT BEFORE SURGERY and the MORNING OF SURGERY with CHG.   2. If you chose to wash your hair, wash your hair first as usual with your normal shampoo.  3. After you shampoo, rinse your hair and body thoroughly to remove the shampoo.  4. Use CHG as you would any other liquid soap. You can apply CHG directly to the skin and wash gently with a scrungie or a clean washcloth.   5. Apply the CHG Soap to your body ONLY FROM THE NECK DOWN.  Do not use on open wounds or open sores. Avoid contact with your eyes, ears, mouth and genitals (private parts). Wash Face and genitals (private parts)  with your normal soap.  6. Wash thoroughly, paying special attention to the area where your surgery will be performed.  7. Thoroughly rinse your body with warm water from the neck down.  8. DO NOT shower/wash with your normal soap after using and rinsing off the CHG Soap.  9. Pat yourself dry with a CLEAN TOWEL.  10. Wear CLEAN PAJAMAS  to bed the night before surgery, wear comfortable clothes the morning of surgery  11. Place CLEAN SHEETS on your bed the night of your first shower and DO NOT SLEEP WITH PETS.    Day of Surgery: Shower as stated above. Do not apply any deodorants/lotions.  Please wear clean clothes to the hospital/surgery center.      Please read over the following fact sheets that you were given.

## 2017-04-18 ENCOUNTER — Encounter (HOSPITAL_COMMUNITY): Payer: Self-pay

## 2017-04-18 ENCOUNTER — Other Ambulatory Visit: Payer: Self-pay

## 2017-04-18 ENCOUNTER — Other Ambulatory Visit (HOSPITAL_COMMUNITY): Payer: Medicare Other

## 2017-04-18 ENCOUNTER — Encounter (HOSPITAL_COMMUNITY)
Admission: RE | Admit: 2017-04-18 | Discharge: 2017-04-18 | Disposition: A | Discharge status: Home / Self Care | Payer: Medicare Other | Source: Ambulatory Visit | Attending: Orthopedic Surgery | Admitting: Orthopedic Surgery

## 2017-04-18 DIAGNOSIS — Z79899 Other long term (current) drug therapy: Secondary | ICD-10-CM | POA: Insufficient documentation

## 2017-04-18 DIAGNOSIS — Z7982 Long term (current) use of aspirin: Secondary | ICD-10-CM | POA: Diagnosis not present

## 2017-04-18 DIAGNOSIS — K219 Gastro-esophageal reflux disease without esophagitis: Secondary | ICD-10-CM | POA: Diagnosis not present

## 2017-04-18 DIAGNOSIS — Z7902 Long term (current) use of antithrombotics/antiplatelets: Secondary | ICD-10-CM | POA: Diagnosis not present

## 2017-04-18 DIAGNOSIS — Z01818 Encounter for other preprocedural examination: Secondary | ICD-10-CM | POA: Insufficient documentation

## 2017-04-18 DIAGNOSIS — I252 Old myocardial infarction: Secondary | ICD-10-CM | POA: Insufficient documentation

## 2017-04-18 DIAGNOSIS — I10 Essential (primary) hypertension: Secondary | ICD-10-CM | POA: Diagnosis not present

## 2017-04-18 DIAGNOSIS — I2510 Atherosclerotic heart disease of native coronary artery without angina pectoris: Secondary | ICD-10-CM | POA: Diagnosis not present

## 2017-04-18 DIAGNOSIS — I4892 Unspecified atrial flutter: Secondary | ICD-10-CM | POA: Diagnosis not present

## 2017-04-18 DIAGNOSIS — Z0183 Encounter for blood typing: Secondary | ICD-10-CM | POA: Insufficient documentation

## 2017-04-18 DIAGNOSIS — E785 Hyperlipidemia, unspecified: Secondary | ICD-10-CM | POA: Insufficient documentation

## 2017-04-18 DIAGNOSIS — Z01812 Encounter for preprocedural laboratory examination: Secondary | ICD-10-CM | POA: Diagnosis not present

## 2017-04-18 DIAGNOSIS — Z955 Presence of coronary angioplasty implant and graft: Secondary | ICD-10-CM | POA: Insufficient documentation

## 2017-04-18 HISTORY — DX: Major depressive disorder, single episode, unspecified: F32.9

## 2017-04-18 HISTORY — DX: Anxiety disorder, unspecified: F41.9

## 2017-04-18 HISTORY — DX: Depression, unspecified: F32.A

## 2017-04-18 LAB — ABO/RH: ABO/RH(D): A POS

## 2017-04-18 LAB — CBC
HEMATOCRIT: 40.3 % (ref 39.0–52.0)
Hemoglobin: 12.8 g/dL — ABNORMAL LOW (ref 13.0–17.0)
MCH: 30.2 pg (ref 26.0–34.0)
MCHC: 31.8 g/dL (ref 30.0–36.0)
MCV: 95 fL (ref 78.0–100.0)
PLATELETS: 197 10*3/uL (ref 150–400)
RBC: 4.24 MIL/uL (ref 4.22–5.81)
RDW: 13.6 % (ref 11.5–15.5)
WBC: 6.9 10*3/uL (ref 4.0–10.5)

## 2017-04-18 LAB — BASIC METABOLIC PANEL
Anion gap: 9 (ref 5–15)
BUN: 18 mg/dL (ref 6–20)
CO2: 23 mmol/L (ref 22–32)
CREATININE: 1.04 mg/dL (ref 0.61–1.24)
Calcium: 8.9 mg/dL (ref 8.9–10.3)
Chloride: 104 mmol/L (ref 101–111)
GFR calc Af Amer: >60 mL/min (ref >60)
Glucose, Bld: 173 mg/dL — ABNORMAL HIGH (ref 65–99)
POTASSIUM: 4.4 mmol/L (ref 3.5–5.1)
SODIUM: 136 mmol/L (ref 135–145)

## 2017-04-18 LAB — TYPE AND SCREEN
ABO/RH(D): A POS
ANTIBODY SCREEN: NEGATIVE

## 2017-04-18 LAB — SURGICAL PCR SCREEN
MRSA, PCR: NEGATIVE
Staphylococcus aureus: NEGATIVE

## 2017-04-21 NOTE — Progress Notes (Signed)
Anesthesia Chart Review:  Pt is a 76 year old male scheduled for L4-5  transforaminal lumbar interbody fusion on 04/24/2017 with Melina Schools, MD  - PCP is Reinaldo Meeker, MD -  Cardiologist is Shirlee More, MD. Last office visit 01/22/17. Dr. Joya Gaskins note indicates pt can hold ASA and plavix for surgery.   PMH includes:  CAD (MI and stents 2003; DES to CX 10/17/14; DES to LAD, PTCA of ostial D1 04/23/16), PSVT, atypical atrial flutter, HTN, hyperlipidemia, GERD. BMI 35.  Medications include: ASA 81 mg, Plavix, Nexium, Zetia, Lasix, Imdur, metoprolol, potassium, simvastatin. Last dose plavix and ASA 04/18/17.   BP 120/60   Pulse (!) 59   Temp 36.7 C (Oral)   Resp 20   Ht 5\' 7"  (1.702 m)   Wt 222 lb 10.6 oz (101 kg)   SpO2 97%   BMI 34.87 kg/m   Preoperative labs reviewed.   - glucose 173. Pt does not have hx DM.   EKG 08/28/16:  - Market sinus bradycardia (46 bpm). RBBB and right axis- possible RVH- consider pulmonary disease  Nuclear stress test 01/28/17:   Nuclear stress EF: 61%.  There was no ST segment deviation noted during stress.  There is a medium defect of moderate severity present in the basal inferior, mid inferior, apical inferior, apical lateral and apex location. The defect is partially reversible. In the setting of normal LVF, this likely represents variations in diaphragmatic attenuation artifact but cannot rule out a small area of ischemia.  This is a low risk study.  The left ventricular ejection fraction is hyperdynamic (>65%).  Cardiac cath 04/23/16 (care everywhere:  - LMCA: 10% stenosis 7 mm length . - LAD: Lesion on 1st Diag: Ostial.90% stenosis 10 mm length reduced to 20%. Lesion on Prox LAD: 90% stenosis 24 mm length reduced to 0%.  2.5 x 26 Resolute Integrity RX DES Stent.  - LCx: Lesion on 1st Ob Marg: 15% stenosis 6 mm length . Lesion on Mid CX: 10% stenosis 3 mm length . - RCA: Lesion on Prox RCA: 20% stenosis 17 mm length . Lesion on R PDA: Ostial 30%  stenosis 6 mm length.  Holter monitor 04/03/16 (care everywhere):  - Sinus rhythm with heart rate ranging 46 bpm up to 113 bpm with average heart rate of 67 bpm. Interventricular conduction delay noted - Rare premature ventricular contractions (1101). - Rare premature atrial contractions (2212).-No atrial tachycardia or atrial fibrillation seen.  If no changes, I anticipate pt can proceed with surgery as scheduled.   Willeen Cass, FNP-BC [facility]/Anesthesiology Phone: [phone] 04/21/2017 11:16 AM

## 2017-04-23 NOTE — Anesthesia Preprocedure Evaluation (Addendum)
Anesthesia Evaluation  Patient identified by MRN, date of birth, ID band Patient awake    Reviewed: Allergy & Precautions, H&P , Patient's Chart, lab work & pertinent test results, reviewed documented beta blocker date and time   Airway Mallampati: II  TM Distance: >3 FB Neck ROM: full    Dental no notable dental hx.    Pulmonary former smoker,    Pulmonary exam normal breath sounds clear to auscultation       Cardiovascular hypertension,  Rhythm:regular Rate:Normal     Neuro/Psych    GI/Hepatic   Endo/Other    Renal/GU      Musculoskeletal   Abdominal   Peds  Hematology   Anesthesia Other Findings   Reproductive/Obstetrics                             Anesthesia Physical Anesthesia Plan  ASA: III  Anesthesia Plan: General   Post-op Pain Management:    Induction: Intravenous  PONV Risk Score and Plan: 1 and Dexamethasone, Ondansetron and Treatment may vary due to age or medical condition  Airway Management Planned: Oral ETT and Video Laryngoscope Planned  Additional Equipment:   Intra-op Plan:   Post-operative Plan: Extubation in OR  Informed Consent: I have reviewed the patients History and Physical, chart, labs and discussed the procedure including the risks, benefits and alternatives for the proposed anesthesia with the patient or authorized representative who has indicated his/her understanding and acceptance.   Dental Advisory Given  Plan Discussed with: CRNA and Surgeon  Anesthesia Plan Comments: (  )       Anesthesia Quick Evaluation

## 2017-04-24 ENCOUNTER — Inpatient Hospital Stay (HOSPITAL_COMMUNITY): Payer: Medicare Other

## 2017-04-24 ENCOUNTER — Inpatient Hospital Stay (HOSPITAL_COMMUNITY)
Admission: RE | Disposition: A | Discharge status: Home / Self Care | Payer: Self-pay | Source: Ambulatory Visit | Attending: Orthopedic Surgery

## 2017-04-24 ENCOUNTER — Inpatient Hospital Stay (HOSPITAL_COMMUNITY)
Admission: RE | Admit: 2017-04-24 | Discharge: 2017-04-25 | DRG: 460 | Disposition: A | Discharge status: Home / Self Care | Payer: Medicare Other | Source: Ambulatory Visit | Attending: Orthopedic Surgery | Admitting: Orthopedic Surgery

## 2017-04-24 ENCOUNTER — Inpatient Hospital Stay (HOSPITAL_COMMUNITY): Payer: Medicare Other | Admitting: Anesthesiology

## 2017-04-24 ENCOUNTER — Other Ambulatory Visit: Payer: Self-pay

## 2017-04-24 ENCOUNTER — Encounter (HOSPITAL_COMMUNITY): Payer: Self-pay

## 2017-04-24 ENCOUNTER — Inpatient Hospital Stay (HOSPITAL_COMMUNITY): Payer: Medicare Other | Admitting: Emergency Medicine

## 2017-04-24 DIAGNOSIS — Z419 Encounter for procedure for purposes other than remedying health state, unspecified: Secondary | ICD-10-CM

## 2017-04-24 DIAGNOSIS — Z7982 Long term (current) use of aspirin: Secondary | ICD-10-CM

## 2017-04-24 DIAGNOSIS — E7800 Pure hypercholesterolemia, unspecified: Secondary | ICD-10-CM | POA: Diagnosis present

## 2017-04-24 DIAGNOSIS — K219 Gastro-esophageal reflux disease without esophagitis: Secondary | ICD-10-CM | POA: Diagnosis present

## 2017-04-24 DIAGNOSIS — Z885 Allergy status to narcotic agent status: Secondary | ICD-10-CM | POA: Diagnosis not present

## 2017-04-24 DIAGNOSIS — Z87891 Personal history of nicotine dependence: Secondary | ICD-10-CM

## 2017-04-24 DIAGNOSIS — I2510 Atherosclerotic heart disease of native coronary artery without angina pectoris: Secondary | ICD-10-CM | POA: Diagnosis present

## 2017-04-24 DIAGNOSIS — E785 Hyperlipidemia, unspecified: Secondary | ICD-10-CM | POA: Diagnosis present

## 2017-04-24 DIAGNOSIS — M4186 Other forms of scoliosis, lumbar region: Secondary | ICD-10-CM | POA: Diagnosis not present

## 2017-04-24 DIAGNOSIS — M5126 Other intervertebral disc displacement, lumbar region: Secondary | ICD-10-CM | POA: Diagnosis not present

## 2017-04-24 DIAGNOSIS — I252 Old myocardial infarction: Secondary | ICD-10-CM

## 2017-04-24 DIAGNOSIS — M4326 Fusion of spine, lumbar region: Secondary | ICD-10-CM | POA: Diagnosis not present

## 2017-04-24 DIAGNOSIS — M48062 Spinal stenosis, lumbar region with neurogenic claudication: Secondary | ICD-10-CM | POA: Diagnosis not present

## 2017-04-24 DIAGNOSIS — Z981 Arthrodesis status: Secondary | ICD-10-CM

## 2017-04-24 DIAGNOSIS — I509 Heart failure, unspecified: Secondary | ICD-10-CM | POA: Diagnosis present

## 2017-04-24 DIAGNOSIS — M48061 Spinal stenosis, lumbar region without neurogenic claudication: Secondary | ICD-10-CM | POA: Diagnosis present

## 2017-04-24 DIAGNOSIS — Z955 Presence of coronary angioplasty implant and graft: Secondary | ICD-10-CM

## 2017-04-24 DIAGNOSIS — I110 Hypertensive heart disease with heart failure: Secondary | ICD-10-CM | POA: Diagnosis not present

## 2017-04-24 DIAGNOSIS — M5116 Intervertebral disc disorders with radiculopathy, lumbar region: Secondary | ICD-10-CM | POA: Diagnosis not present

## 2017-04-24 DIAGNOSIS — I10 Essential (primary) hypertension: Secondary | ICD-10-CM | POA: Diagnosis not present

## 2017-04-24 DIAGNOSIS — M549 Dorsalgia, unspecified: Secondary | ICD-10-CM | POA: Diagnosis not present

## 2017-04-24 DIAGNOSIS — Z7902 Long term (current) use of antithrombotics/antiplatelets: Secondary | ICD-10-CM | POA: Diagnosis not present

## 2017-04-24 HISTORY — PX: TRANSFORAMINAL LUMBAR INTERBODY FUSION (TLIF) WITH PEDICLE SCREW FIXATION 1 LEVEL: SHX6141

## 2017-04-24 HISTORY — DX: Arthrodesis status: Z98.1

## 2017-04-24 SURGERY — TRANSFORAMINAL LUMBAR INTERBODY FUSION (TLIF) WITH PEDICLE SCREW FIXATION 1 LEVEL
Anesthesia: General

## 2017-04-24 MED ORDER — PANTOPRAZOLE SODIUM 40 MG PO TBEC
40.0000 mg | DELAYED_RELEASE_TABLET | Freq: Every day | ORAL | Status: DC
  Administered 2017-04-25: 40 mg via ORAL
  Filled 2017-04-24: qty 1

## 2017-04-24 MED ORDER — METHOCARBAMOL 1000 MG/10ML IJ SOLN
500.0000 mg | Freq: Four times a day (QID) | INTRAVENOUS | Status: DC | PRN
  Filled 2017-04-24: qty 5

## 2017-04-24 MED ORDER — DEXAMETHASONE SODIUM PHOSPHATE 10 MG/ML IJ SOLN
INTRAMUSCULAR | Status: DC | PRN
  Administered 2017-04-24: 10 mg via INTRAVENOUS

## 2017-04-24 MED ORDER — SURGIFOAM 100 EX MISC
CUTANEOUS | Status: DC | PRN
  Administered 2017-04-24: 1 via TOPICAL

## 2017-04-24 MED ORDER — HYDROMORPHONE HCL 1 MG/ML IJ SOLN
1.0000 mg | INTRAMUSCULAR | Status: DC | PRN
  Administered 2017-04-24 – 2017-04-25 (×2): 1 mg via INTRAVENOUS
  Filled 2017-04-24 (×2): qty 1

## 2017-04-24 MED ORDER — DIPHENHYDRAMINE HCL 25 MG PO CAPS
25.0000 mg | ORAL_CAPSULE | Freq: Four times a day (QID) | ORAL | Status: DC | PRN
  Administered 2017-04-24: 25 mg via ORAL
  Filled 2017-04-24: qty 1

## 2017-04-24 MED ORDER — PROPOFOL 10 MG/ML IV BOLUS
INTRAVENOUS | Status: AC
  Filled 2017-04-24: qty 20

## 2017-04-24 MED ORDER — MIDAZOLAM HCL 2 MG/2ML IJ SOLN
INTRAMUSCULAR | Status: DC | PRN
  Administered 2017-04-24 (×2): 1 mg via INTRAVENOUS

## 2017-04-24 MED ORDER — LIDOCAINE HCL (CARDIAC) 20 MG/ML IV SOLN
INTRAVENOUS | Status: DC | PRN
  Administered 2017-04-24: 100 mg via INTRATRACHEAL

## 2017-04-24 MED ORDER — PROPOFOL 500 MG/50ML IV EMUL
INTRAVENOUS | Status: DC | PRN
  Administered 2017-04-24: 50 ug/kg/min via INTRAVENOUS

## 2017-04-24 MED ORDER — ACETAMINOPHEN 325 MG PO TABS: 650.0000 mg | ORAL_TABLET | ORAL | Status: DC | PRN

## 2017-04-24 MED ORDER — BUPIVACAINE-EPINEPHRINE 0.25% -1:200000 IJ SOLN
INTRAMUSCULAR | Status: AC
  Filled 2017-04-24: qty 1

## 2017-04-24 MED ORDER — MIDAZOLAM HCL 2 MG/2ML IJ SOLN
INTRAMUSCULAR | Status: AC
  Filled 2017-04-24: qty 2

## 2017-04-24 MED ORDER — ONDANSETRON HCL 4 MG PO TABS: 4.0000 mg | ORAL_TABLET | Freq: Four times a day (QID) | ORAL | Status: DC | PRN

## 2017-04-24 MED ORDER — THROMBIN (RECOMBINANT) 5000 UNITS EX SOLR
CUTANEOUS | Status: AC
  Filled 2017-04-24: qty 5000

## 2017-04-24 MED ORDER — 0.9 % SODIUM CHLORIDE (POUR BTL) OPTIME
TOPICAL | Status: DC | PRN
  Administered 2017-04-24 (×2): 1000 mL

## 2017-04-24 MED ORDER — FINASTERIDE 5 MG PO TABS
5.0000 mg | ORAL_TABLET | Freq: Every day | ORAL | Status: DC
  Administered 2017-04-24 – 2017-04-25 (×2): 5 mg via ORAL
  Filled 2017-04-24 (×2): qty 1

## 2017-04-24 MED ORDER — FENTANYL CITRATE (PF) 100 MCG/2ML IJ SOLN
INTRAMUSCULAR | Status: AC
  Filled 2017-04-24: qty 2

## 2017-04-24 MED ORDER — ONDANSETRON HCL 4 MG/2ML IJ SOLN
INTRAMUSCULAR | Status: DC | PRN
  Administered 2017-04-24: 4 mg via INTRAVENOUS

## 2017-04-24 MED ORDER — DIPHENHYDRAMINE HCL 50 MG/ML IJ SOLN
INTRAMUSCULAR | Status: AC
  Filled 2017-04-24: qty 1

## 2017-04-24 MED ORDER — FUROSEMIDE 40 MG PO TABS: 40.0000 mg | ORAL_TABLET | Freq: Every day | ORAL | Status: DC

## 2017-04-24 MED ORDER — ACETAMINOPHEN 10 MG/ML IV SOLN
INTRAVENOUS | Status: AC
  Filled 2017-04-24: qty 100

## 2017-04-24 MED ORDER — HYDROMORPHONE HCL 2 MG PO TABS
2.0000 mg | ORAL_TABLET | ORAL | Status: DC | PRN
  Administered 2017-04-24 – 2017-04-25 (×3): 2 mg via ORAL
  Filled 2017-04-24 (×3): qty 1

## 2017-04-24 MED ORDER — POTASSIUM CHLORIDE CRYS ER 20 MEQ PO TBCR
20.0000 meq | EXTENDED_RELEASE_TABLET | Freq: Two times a day (BID) | ORAL | Status: DC
  Administered 2017-04-24 – 2017-04-25 (×3): 20 meq via ORAL
  Filled 2017-04-24 (×3): qty 1

## 2017-04-24 MED ORDER — TAMSULOSIN HCL 0.4 MG PO CAPS
0.4000 mg | ORAL_CAPSULE | Freq: Every day | ORAL | Status: DC
  Administered 2017-04-25: 0.4 mg via ORAL
  Filled 2017-04-24: qty 1

## 2017-04-24 MED ORDER — ONDANSETRON HCL 4 MG/2ML IJ SOLN: 4.0000 mg | Freq: Four times a day (QID) | INTRAMUSCULAR | Status: DC | PRN

## 2017-04-24 MED ORDER — LACTATED RINGERS IV SOLN: INTRAVENOUS | Status: DC

## 2017-04-24 MED ORDER — ACETAMINOPHEN 650 MG RE SUPP: 650.0000 mg | RECTAL | Status: DC | PRN

## 2017-04-24 MED ORDER — ONDANSETRON 4 MG PO TBDP: 4.0000 mg | ORAL_TABLET | Freq: Three times a day (TID) | ORAL | 0 refills | Status: DC | PRN

## 2017-04-24 MED ORDER — SUCCINYLCHOLINE CHLORIDE 20 MG/ML IJ SOLN
INTRAMUSCULAR | Status: DC | PRN
Start: 2017-04-24 — End: 2017-04-24
  Administered 2017-04-24: 120 mg via INTRAVENOUS

## 2017-04-24 MED ORDER — SODIUM CHLORIDE 0.9% FLUSH: 3.0000 mL | INTRAVENOUS | Status: DC | PRN

## 2017-04-24 MED ORDER — LIDOCAINE 2% (20 MG/ML) 5 ML SYRINGE
INTRAMUSCULAR | Status: AC
  Filled 2017-04-24: qty 5

## 2017-04-24 MED ORDER — LACTATED RINGERS IV SOLN
INTRAVENOUS | Status: DC | PRN
  Administered 2017-04-24 (×2): via INTRAVENOUS

## 2017-04-24 MED ORDER — SUCCINYLCHOLINE CHLORIDE 200 MG/10ML IV SOSY
PREFILLED_SYRINGE | INTRAVENOUS | Status: AC
  Filled 2017-04-24: qty 10

## 2017-04-24 MED ORDER — HEMOSTATIC AGENTS (NO CHARGE) OPTIME
TOPICAL | Status: DC | PRN
  Administered 2017-04-24: 1 via TOPICAL

## 2017-04-24 MED ORDER — THROMBIN (RECOMBINANT) 20000 UNITS EX SOLR
CUTANEOUS | Status: DC | PRN
Start: 2017-04-24 — End: 2017-04-24
  Administered 2017-04-24: 5000 [IU] via TOPICAL

## 2017-04-24 MED ORDER — POLYETHYLENE GLYCOL 3350 17 G PO PACK: 17.0000 g | PACK | Freq: Every day | ORAL | Status: DC | PRN

## 2017-04-24 MED ORDER — FUROSEMIDE 40 MG PO TABS
80.0000 mg | ORAL_TABLET | Freq: Every day | ORAL | Status: DC
  Administered 2017-04-24: 80 mg via ORAL
  Filled 2017-04-24 (×2): qty 2

## 2017-04-24 MED ORDER — EPHEDRINE SULFATE 50 MG/ML IJ SOLN
INTRAMUSCULAR | Status: DC | PRN
  Administered 2017-04-24: 10 mg via INTRAVENOUS
  Administered 2017-04-24: 15 mg via INTRAVENOUS

## 2017-04-24 MED ORDER — CEFAZOLIN SODIUM-DEXTROSE 2-4 GM/100ML-% IV SOLN
2.0000 g | Freq: Three times a day (TID) | INTRAVENOUS | Status: AC
  Administered 2017-04-24 (×2): 2 g via INTRAVENOUS
  Filled 2017-04-24 (×2): qty 100

## 2017-04-24 MED ORDER — EPHEDRINE 5 MG/ML INJ
INTRAVENOUS | Status: AC
  Filled 2017-04-24: qty 10

## 2017-04-24 MED ORDER — ISOSORBIDE MONONITRATE ER 60 MG PO TB24
60.0000 mg | ORAL_TABLET | Freq: Every day | ORAL | Status: DC
  Administered 2017-04-25: 60 mg via ORAL
  Filled 2017-04-24: qty 1

## 2017-04-24 MED ORDER — ARTIFICIAL TEARS OPHTHALMIC OINT
TOPICAL_OINTMENT | OPHTHALMIC | Status: AC
  Filled 2017-04-24: qty 3.5

## 2017-04-24 MED ORDER — EZETIMIBE 10 MG PO TABS
10.0000 mg | ORAL_TABLET | Freq: Every evening | ORAL | Status: DC
  Filled 2017-04-24 (×2): qty 1

## 2017-04-24 MED ORDER — PHENOL 1.4 % MT LIQD: 1.0000 | OROMUCOSAL | Status: DC | PRN

## 2017-04-24 MED ORDER — NITROGLYCERIN 0.4 MG SL SUBL: 0.4000 mg | SUBLINGUAL_TABLET | SUBLINGUAL | Status: DC | PRN

## 2017-04-24 MED ORDER — ACETAMINOPHEN 10 MG/ML IV SOLN
INTRAVENOUS | Status: DC | PRN
  Administered 2017-04-24: 1000 mg via INTRAVENOUS

## 2017-04-24 MED ORDER — PROPOFOL 10 MG/ML IV BOLUS
INTRAVENOUS | Status: DC | PRN
  Administered 2017-04-24: 50 mg via INTRAVENOUS
  Administered 2017-04-24: 150 mg via INTRAVENOUS

## 2017-04-24 MED ORDER — FENTANYL CITRATE (PF) 100 MCG/2ML IJ SOLN
25.0000 ug | INTRAMUSCULAR | Status: DC | PRN
  Administered 2017-04-24 (×3): 50 ug via INTRAVENOUS

## 2017-04-24 MED ORDER — SIMVASTATIN 20 MG PO TABS
40.0000 mg | ORAL_TABLET | Freq: Every day | ORAL | Status: DC
  Administered 2017-04-24: 40 mg via ORAL
  Filled 2017-04-24: qty 2

## 2017-04-24 MED ORDER — FENTANYL CITRATE (PF) 100 MCG/2ML IJ SOLN
INTRAMUSCULAR | Status: AC
  Administered 2017-04-24: 50 ug via INTRAVENOUS
  Filled 2017-04-24: qty 2

## 2017-04-24 MED ORDER — MENTHOL 3 MG MT LOZG: 1.0000 | LOZENGE | OROMUCOSAL | Status: DC | PRN

## 2017-04-24 MED ORDER — ALBUMIN HUMAN 5 % IV SOLN
INTRAVENOUS | Status: DC | PRN
  Administered 2017-04-24 (×2): via INTRAVENOUS

## 2017-04-24 MED ORDER — METHOCARBAMOL 500 MG PO TABS
ORAL_TABLET | ORAL | Status: AC
  Filled 2017-04-24: qty 1

## 2017-04-24 MED ORDER — METOPROLOL SUCCINATE ER 25 MG PO TB24
25.0000 mg | ORAL_TABLET | Freq: Every day | ORAL | Status: DC
  Administered 2017-04-25: 25 mg via ORAL
  Filled 2017-04-24: qty 1

## 2017-04-24 MED ORDER — FENTANYL CITRATE (PF) 250 MCG/5ML IJ SOLN
INTRAMUSCULAR | Status: DC | PRN
  Administered 2017-04-24: 50 ug via INTRAVENOUS
  Administered 2017-04-24: 150 ug via INTRAVENOUS
  Administered 2017-04-24: 50 ug via INTRAVENOUS

## 2017-04-24 MED ORDER — MAGNESIUM CITRATE PO SOLN
1.0000 | Freq: Once | ORAL | Status: AC | PRN
  Administered 2017-04-25: 1 via ORAL
  Filled 2017-04-24: qty 296

## 2017-04-24 MED ORDER — PROPOFOL 1000 MG/100ML IV EMUL
INTRAVENOUS | Status: AC
  Filled 2017-04-24: qty 100

## 2017-04-24 MED ORDER — DIPHENHYDRAMINE HCL 50 MG/ML IJ SOLN
INTRAMUSCULAR | Status: DC | PRN
  Administered 2017-04-24: 12.5 mg via INTRAVENOUS

## 2017-04-24 MED ORDER — FENTANYL CITRATE (PF) 250 MCG/5ML IJ SOLN
INTRAMUSCULAR | Status: AC
  Filled 2017-04-24: qty 5

## 2017-04-24 MED ORDER — ONDANSETRON HCL 4 MG/2ML IJ SOLN
INTRAMUSCULAR | Status: AC
  Filled 2017-04-24: qty 2

## 2017-04-24 MED ORDER — SODIUM CHLORIDE 0.9 % IV SOLN: 250.0000 mL | INTRAVENOUS | Status: DC

## 2017-04-24 MED ORDER — BUPIVACAINE-EPINEPHRINE 0.25% -1:200000 IJ SOLN
INTRAMUSCULAR | Status: DC | PRN
Start: 2017-04-24 — End: 2017-04-24
  Administered 2017-04-24: 20 mL

## 2017-04-24 MED ORDER — ARTIFICIAL TEARS OPHTHALMIC OINT
TOPICAL_OINTMENT | OPHTHALMIC | Status: DC | PRN
  Administered 2017-04-24: 1 via OPHTHALMIC

## 2017-04-24 MED ORDER — SODIUM CHLORIDE 0.9% FLUSH
3.0000 mL | Freq: Two times a day (BID) | INTRAVENOUS | Status: DC
  Administered 2017-04-24: 3 mL via INTRAVENOUS

## 2017-04-24 MED ORDER — DEXAMETHASONE SODIUM PHOSPHATE 10 MG/ML IJ SOLN
INTRAMUSCULAR | Status: AC
  Filled 2017-04-24: qty 1

## 2017-04-24 MED ORDER — HYDROMORPHONE HCL 2 MG PO TABS: 2.0000 mg | ORAL_TABLET | Freq: Four times a day (QID) | ORAL | 0 refills | Status: AC | PRN

## 2017-04-24 MED ORDER — METHOCARBAMOL 500 MG PO TABS: 500.0000 mg | ORAL_TABLET | Freq: Three times a day (TID) | ORAL | 0 refills | Status: DC

## 2017-04-24 MED ORDER — GLYCOPYRROLATE 0.2 MG/ML IJ SOLN
INTRAMUSCULAR | Status: DC | PRN
  Administered 2017-04-24: 0.2 mg via INTRAVENOUS

## 2017-04-24 MED ORDER — PHENYLEPHRINE HCL 10 MG/ML IJ SOLN
INTRAVENOUS | Status: DC | PRN
  Administered 2017-04-24: 25 ug/min via INTRAVENOUS

## 2017-04-24 MED ORDER — METHOCARBAMOL 500 MG PO TABS
500.0000 mg | ORAL_TABLET | Freq: Four times a day (QID) | ORAL | Status: DC | PRN
  Administered 2017-04-24 – 2017-04-25 (×4): 500 mg via ORAL
  Filled 2017-04-24 (×3): qty 1

## 2017-04-24 MED ORDER — MAGNESIUM OXIDE 400 (241.3 MG) MG PO TABS
400.0000 mg | ORAL_TABLET | Freq: Every day | ORAL | Status: DC
  Administered 2017-04-25: 400 mg via ORAL
  Filled 2017-04-24: qty 1

## 2017-04-24 MED ORDER — PHENYLEPHRINE HCL 10 MG/ML IJ SOLN
INTRAMUSCULAR | Status: DC | PRN
  Administered 2017-04-24 (×2): 120 ug via INTRAVENOUS
  Administered 2017-04-24 (×2): 80 ug via INTRAVENOUS

## 2017-04-24 MED ORDER — CEFAZOLIN SODIUM-DEXTROSE 2-4 GM/100ML-% IV SOLN
2.0000 g | INTRAVENOUS | Status: AC
  Administered 2017-04-24: 2 g via INTRAVENOUS
  Filled 2017-04-24: qty 100

## 2017-04-24 MED ORDER — DOCUSATE SODIUM 100 MG PO CAPS
100.0000 mg | ORAL_CAPSULE | Freq: Two times a day (BID) | ORAL | Status: DC
  Administered 2017-04-24 – 2017-04-25 (×3): 100 mg via ORAL
  Filled 2017-04-24 (×3): qty 1

## 2017-04-24 SURGICAL SUPPLY — 76 items
BLADE CLIPPER SURG (BLADE) IMPLANT
BUR EGG ELITE 4.0 (BURR) IMPLANT
BUR EGG ELITE 4.0MM (BURR)
CANISTER SUCT 3000ML PPV (MISCELLANEOUS) ×3 IMPLANT
CLIP NEUROVISION LG (CLIP) ×3 IMPLANT
CLOSURE STERI-STRIP 1/2X4 (GAUZE/BANDAGES/DRESSINGS) ×1
CLOSURE WOUND 1/2 X4 (GAUZE/BANDAGES/DRESSINGS) ×2
CLSR STERI-STRIP ANTIMIC 1/2X4 (GAUZE/BANDAGES/DRESSINGS) ×2 IMPLANT
COVER SURGICAL LIGHT HANDLE (MISCELLANEOUS) ×3 IMPLANT
DEVICE ENDSKLTN NANOLOCK 10 XL (Cage) ×1 IMPLANT
DRAPE C-ARM 42X72 X-RAY (DRAPES) ×3 IMPLANT
DRAPE C-ARMOR (DRAPES) ×3 IMPLANT
DRAPE POUCH INSTRU U-SHP 10X18 (DRAPES) ×9 IMPLANT
DRAPE SURG 17X23 STRL (DRAPES) ×3 IMPLANT
DRAPE U-SHAPE 47X51 STRL (DRAPES) ×3 IMPLANT
DRSG AQUACEL AG ADV 3.5X10 (GAUZE/BANDAGES/DRESSINGS) ×3 IMPLANT
DRSG OPSITE POSTOP 4X8 (GAUZE/BANDAGES/DRESSINGS) ×6 IMPLANT
DURAPREP 26ML APPLICATOR (WOUND CARE) ×3 IMPLANT
ELECT BLADE 4.0 EZ CLEAN MEGAD (MISCELLANEOUS) ×3
ELECT BLADE 6.5 EXT (BLADE) IMPLANT
ELECT PENCIL ROCKER SW 15FT (MISCELLANEOUS) ×3 IMPLANT
ELECT REM PT RETURN 9FT ADLT (ELECTROSURGICAL) ×3
ELECTRODE BLDE 4.0 EZ CLN MEGD (MISCELLANEOUS) ×1 IMPLANT
ELECTRODE REM PT RTRN 9FT ADLT (ELECTROSURGICAL) ×1 IMPLANT
ENDOSKELETON NANOLOCK 10 XL (Cage) ×3 IMPLANT
GLOVE BIO SURGEON STRL SZ 6.5 (GLOVE) ×6 IMPLANT
GLOVE BIO SURGEONS STRL SZ 6.5 (GLOVE) ×3
GLOVE BIOGEL PI IND STRL 6.5 (GLOVE) ×1 IMPLANT
GLOVE BIOGEL PI IND STRL 8.5 (GLOVE) ×1 IMPLANT
GLOVE BIOGEL PI INDICATOR 6.5 (GLOVE) ×2
GLOVE BIOGEL PI INDICATOR 8.5 (GLOVE) ×2
GLOVE SS BIOGEL STRL SZ 8.5 (GLOVE) ×1 IMPLANT
GLOVE SUPERSENSE BIOGEL SZ 8.5 (GLOVE) ×2
GOWN STRL REUS W/ TWL LRG LVL3 (GOWN DISPOSABLE) ×3 IMPLANT
GOWN STRL REUS W/TWL 2XL LVL3 (GOWN DISPOSABLE) ×6 IMPLANT
GOWN STRL REUS W/TWL LRG LVL3 (GOWN DISPOSABLE) ×6
KIT BASIN OR (CUSTOM PROCEDURE TRAY) ×3 IMPLANT
KIT POSITION SURG JACKSON T1 (MISCELLANEOUS) IMPLANT
KIT ROOM TURNOVER OR (KITS) ×3 IMPLANT
LIGHT SOURCE ANGLE TIP STR 7FT (MISCELLANEOUS) ×3 IMPLANT
MODULE NVM5 NEXT GEN EMG (NEEDLE) ×3 IMPLANT
NEEDLE 22X1 1/2 (OR ONLY) (NEEDLE) ×3 IMPLANT
NEEDLE I-PASS III (NEEDLE) ×3 IMPLANT
NEEDLE SPNL 18GX3.5 QUINCKE PK (NEEDLE) ×6 IMPLANT
NS IRRIG 1000ML POUR BTL (IV SOLUTION) ×6 IMPLANT
PACK LAMINECTOMY ORTHO (CUSTOM PROCEDURE TRAY) ×3 IMPLANT
PACK UNIVERSAL I (CUSTOM PROCEDURE TRAY) ×3 IMPLANT
PAD ARMBOARD 7.5X6 YLW CONV (MISCELLANEOUS) ×6 IMPLANT
PATTIES SURGICAL .5 X.5 (GAUZE/BANDAGES/DRESSINGS) ×3 IMPLANT
PATTIES SURGICAL .5 X1 (DISPOSABLE) ×3 IMPLANT
POSITIONER HEAD PRONE TRACH (MISCELLANEOUS) ×3 IMPLANT
PROBE BALL TIP NVM5 SNG USE (BALLOONS) ×3 IMPLANT
PUTTY DBX 1CC (Putty) ×3 IMPLANT
PUTTY DBX 1CC DEPUY (Putty) ×1 IMPLANT
REDUCTION EXT RELINE MAS MOD (Neuro Prosthesis/Implant) ×6 IMPLANT
ROD RELINE MAS LORD 5.5X45MM (Rod) ×6 IMPLANT
SCREW LOCK RELINE 5.5 TULIP (Screw) ×12 IMPLANT
SCREW RELINE RED 6.5X45MM POLY (Screw) ×6 IMPLANT
SCREW SHANK RELINE 6.5X45MM 2C (Screw) ×6 IMPLANT
SHEET CONFORM 45LX20WX5H (Bone Implant) ×3 IMPLANT
SPONGE LAP 4X18 X RAY DECT (DISPOSABLE) ×9 IMPLANT
SPONGE SURGIFOAM ABS GEL 100 (HEMOSTASIS) ×3 IMPLANT
STRIP CLOSURE SKIN 1/2X4 (GAUZE/BANDAGES/DRESSINGS) ×4 IMPLANT
SURGIFLO W/THROMBIN 8M KIT (HEMOSTASIS) ×3 IMPLANT
SUT BONE WAX W31G (SUTURE) ×3 IMPLANT
SUT MNCRL AB 3-0 PS2 18 (SUTURE) ×6 IMPLANT
SUT VIC AB 1 CT1 18XCR BRD 8 (SUTURE) ×1 IMPLANT
SUT VIC AB 1 CT1 8-18 (SUTURE) ×2
SUT VIC AB 2-0 CT1 18 (SUTURE) ×3 IMPLANT
SYR BULB IRRIGATION 50ML (SYRINGE) ×3 IMPLANT
SYR CONTROL 10ML LL (SYRINGE) ×6 IMPLANT
TOWEL OR 17X24 6PK STRL BLUE (TOWEL DISPOSABLE) ×6 IMPLANT
TOWEL OR 17X26 10 PK STRL BLUE (TOWEL DISPOSABLE) ×3 IMPLANT
TRAY FOLEY W/METER SILVER 16FR (SET/KITS/TRAYS/PACK) ×3 IMPLANT
WATER STERILE IRR 1000ML POUR (IV SOLUTION) ×3 IMPLANT
YANKAUER SUCT BULB TIP NO VENT (SUCTIONS) ×3 IMPLANT

## 2017-04-24 NOTE — Anesthesia Postprocedure Evaluation (Signed)
Anesthesia Post Note  Patient: Andrew Hardy  Procedure(s) Performed: TRANSFORAMINAL LUMBAR INTERBODY FUSION (TLIF) L4-5 (N/A )     Patient location during evaluation: PACU Anesthesia Type: General Level of consciousness: awake and alert Pain management: pain level controlled Vital Signs Assessment: post-procedure vital signs reviewed and stable Respiratory status: spontaneous breathing, nonlabored ventilation, respiratory function stable and patient connected to nasal cannula oxygen Cardiovascular status: blood pressure returned to baseline and stable Postop Assessment: no apparent nausea or vomiting Anesthetic complications: no    Last Vitals:  Vitals:   04/24/17 1215 04/24/17 1240  BP: 119/65 (!) 114/53  Pulse: 81 74  Resp: 20 18  Temp: 36.6 C 36.9 C  SpO2: 95% 93%    Last Pain:  Vitals:   04/24/17 1240  TempSrc: Oral  PainSc:                  [name]

## 2017-04-24 NOTE — Plan of Care (Signed)
  Progressing Education: Knowledge of General Education information will improve 04/24/2017 2240 - Progressing by Margot Chimes D, RN Health Behavior/Discharge Planning: Ability to manage health-related needs will improve 04/24/2017 2240 - Progressing by Pricilla Holm, Reneisha Stilley D, RN Clinical Measurements: Ability to maintain clinical measurements within normal limits will improve 04/24/2017 2240 - Progressing by Pricilla Holm, Yudith Norlander D, RN Will remain free from infection 04/24/2017 2240 - Progressing by Margot Chimes D, RN Diagnostic test results will improve 04/24/2017 2240 - Progressing by Margot Chimes D, RN Respiratory complications will improve 04/24/2017 2240 - Progressing by Margot Chimes D, RN Cardiovascular complication will be avoided 04/24/2017 2240 - Progressing by Margot Chimes D, RN Activity: Risk for activity intolerance will decrease 04/24/2017 2240 - Progressing by Margot Chimes D, RN Nutrition: Adequate nutrition will be maintained 04/24/2017 2240 - Progressing by Margot Chimes D, RN Coping: Level of anxiety will decrease 04/24/2017 2240 - Progressing by Margot Chimes D, RN Elimination: Will not experience complications related to bowel motility 04/24/2017 2240 - Progressing by Charlena Cross, RN Will not experience complications related to urinary retention 04/24/2017 2240 - Progressing by Margot Chimes D, RN Pain Managment: General experience of comfort will improve 04/24/2017 2240 - Progressing by Margot Chimes D, RN Safety: Ability to remain free from injury will improve 04/24/2017 2240 - Progressing by Pricilla Holm, Ryo Klang D, RN Skin Integrity: Risk for impaired skin integrity will decrease 04/24/2017 2240 - Progressing by Charlena Cross, RN Activity: Ability to avoid complications of mobility impairment will improve 04/24/2017 2240 - Progressing by Pricilla Holm, Morry Veiga D, RN Ability to tolerate increased activity will improve 04/24/2017 2240 - Progressing by Charlena Cross, RN Will remain free from  falls 04/24/2017 2240 - Progressing by Margot Chimes D, RN Bowel/Gastric: Gastrointestinal status for postoperative course will improve 04/24/2017 2240 - Progressing by Charlena Cross, RN Education: Ability to verbalize activity precautions or restrictions will improve 04/24/2017 2240 - Progressing by Pricilla Holm, Arisbeth Purrington D, RN Knowledge of the prescribed therapeutic regimen will improve 04/24/2017 2240 - Progressing by Margot Chimes D, RN Understanding of discharge needs will improve 04/24/2017 2240 - Progressing by Charlena Cross, RN Physical Regulation: Ability to maintain clinical measurements within normal limits will improve 04/24/2017 2240 - Progressing by Pricilla Holm, Corian Handley D, RN Postoperative complications will be avoided or minimized 04/24/2017 2240 - Progressing by Margot Chimes D, RN Diagnostic test results will improve 04/24/2017 2240 - Progressing by Margot Chimes D, RN Pain Management: Pain level will decrease 04/24/2017 2240 - Progressing by Charlena Cross, RN Skin Integrity: Signs of wound healing will improve 04/24/2017 2240 - Progressing by Margot Chimes D, RN Health Behavior/Discharge Planning: Identification of resources available to assist in meeting health care needs will improve 04/24/2017 2240 - Progressing by Margot Chimes D, RN Bladder/Genitourinary: Urinary functional status for postoperative course will improve 04/24/2017 2240 - Progressing by Charlena Cross, RN

## 2017-04-24 NOTE — Op Note (Signed)
Operative report.  Preoperative diagnosis.  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  Postoperative diagnosis same.  Operative procedure.  Transforaminal lumbar interbody fusion (TLIF) L4-5.  First Assistant: Ronette Deter, [place].  Implants: No invasive MIS pedicle screws.  25 x 45 mm.  X4.  45 mm rod.  Titan Nano lock intervertebral cage.  10 mm extra long.  Graft source: Autograft, augmented with DBX, and con form allograft sheet.  Indications.  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  Attempts at conservative management had failed to alleviate his symptoms.  As a result we elected to proceed with the aforementioned surgery.  All appropriate risks benefits and alternatives to surgery were discussed in questions were addressed.  Patient signed consent and elected to proceed with surgery.  Operative report: Patient was brought the operating room placed upon the operating table.  After successful induction of general anesthesia and endotracheal intubation teds SCDs and a Foley were inserted.  Patient was turned prone onto the Wilson frame and all bony prominences were well-padded.  A timeout was then taken confirming patient procedure and all other important data.  Fluoroscopy views were then obtained to identify the lateral border of the L4, and L5 pedicles.  Because he was having mostly radicular right leg pain I elected to do the decompression and insertion of the TLIF cage from the right side.  At this point I started by making a small incision on the lateral side of the left L4 pedicle.  I then advanced the Jamshidi needle percutaneously down to the lateral border of the L4 pedicle.  I then connected the Jamshidi needle to the intraoperative neuro monitoring device and advanced the Jamshidi needle using neuro monitoring, as well as fluoroscopy.  Once I was nearing the medial  border of the pedicle I then switched to the lateral view to confirm it was just beyond the posterior margin of the vertebral body.  Once this was confirmed I advanced the Jamshidi needle into the I then replaced the guide pin to cannulate the pedicle.  Confirmed satisfactory position of this pins in both the AP and lateral planes.  The pedicle screws placed over the guidepins pedicle body.  I then directly stimulated the screws and there was no abnormal free running EMG activity at 40 mA.  Once the left L4 and L5 pedicle screws were placed I went to the right-hand side.  A Wiltsie incision was made and sharp dissection was carried out down to the fascia.  I incised the deep fascia and bluntly dissected through the paravertebral musculature until I could palpate the lateral aspect of the L4-5 facet complex.  Using the same technique I used on the left-hand side I then placed the Jamshidi needles onto the lateral aspect of the facet complex and advance them into the pedicle.  Again the Jamshidi needles were directly stimulated no abnormal activity.  In addition x-ray guidance confirmed satisfactory trajectory of the needle.  I repeated this procedure at the L5 pedicle.  Once both pedicles were cannulated with this the-retractor complex over the guidepin.  I confirmed satisfactory position, as well as trajectory using fluoroscopy.  I then stimulated both screws and there was no abnormal activity at 40 mA.  Once the retractor system was constructed I excellent visualization of the posterior lateral aspect of the bony spine.  The L4 lamina was clearly visible as was the L4-5 facet complex.  Osteotomes were used to resect the entire inferior L4 facet.  3 and 4 mm Kerrison rongeurs were perform a generous laminotomy of L4.  Penfield 4 was used to dissect through the ligamentum flavum and then I used my 3 mm Kerrison to resect this.  At the superior portion of the L5 facet was resected using an osteotome to give clear  visualization of the disc space.  I then remove the L4 pars and identify the exiting L4 nerve root.  At this point I had clear visualization of the L5 and L4 nerve roots.  I could palpate freely into the L5 foramen, and the L4 nerve root was completely freed having removed the pars.  With the decompression complete I then placed neuro patties to protect the traversing and exiting nerve root and the retractor remove the disc protect the thecal sac.  I now had excellent visualization of the posterior lateral aspect of the disc space.  Hemostasis was obtained using bipolar cautery and then an annulotomy was performed a 15 blade scalpel.  I then used side curettes and pituitary rongeurs to remove the disc at L4-5.  After completing the discectomy I could visualize the bony endplate.  I had removed all of the cartilage and disc material.  With a discectomy complete I then trialed with the smooth trials and elected to use the size 10 extra long intervertebral cage.  The cage is obtained and packed with autograft from the decompression along with DBX.  Con form sheet was placed along the anterior annulus, and the cage was then malleted to the appropriate depth.  Excellent excellent purchase.  I then pushed the cage from a vertical addition into a horizontal one.  Final resting place of the cage was in the anterior third of the disc space.  The kyphosis was taken out of the Wilson frame.  Irrigation was done and hemostasis was obtained using bipolar electrocautery and FloSeal.  At this point time I measured for the rod and then obtained a size 45 mm length rod.  This was secured into place and the top locking nuts were secured on.  The construct was then torqued according manufacturer standards to final tightened the rods.  This was done bilaterally.  At this point I then took final x-rays.  Hardware is properly positioned.  I was pleased with the overall decompression.  At this point I copiously irrigated the wound  with normal saline and placed a thrombin-soaked Gelfoam patties over the exposed laminotomy I then closed the wounds were in a layered fashion with interrupted #1 Vicryl suture, 2-0 Vicryl suture, and then 3-0 Vicryl.  Steri-Strips and a dry dressing were applied.  Patient was ultimately extubated transferred the PACU without incident.  End of the case all needle sponge counts were correct.

## 2017-04-24 NOTE — Transfer of Care (Signed)
Immediate Anesthesia Transfer of Care Note  Patient: Andrew Hardy  Procedure(s) Performed: TRANSFORAMINAL LUMBAR INTERBODY FUSION (TLIF) L4-5 (N/A )  Patient Location: PACU  Anesthesia Type:General  Level of Consciousness: awake, alert  and patient cooperative  Airway & Oxygen Therapy: Patient Spontanous Breathing and Patient connected to nasal cannula oxygen  Post-op Assessment: Report given to RN, Post -op Vital signs reviewed and stable, Patient moving all extremities X 4 and Patient able to stick tongue midline  Post vital signs: Reviewed and stable  Last Vitals:  Vitals:   04/24/17 0544 04/24/17 0545  BP:  (!) 142/59  Pulse: (!) 52   Resp: 20   Temp: 36.6 C   SpO2: 96%     Last Pain:  Vitals:   04/24/17 0603  TempSrc:   PainSc: 4       Patients Stated Pain Goal: 5 (40/98/11 9147)  Complications: No apparent anesthesia complications

## 2017-04-24 NOTE — Evaluation (Signed)
Physical Therapy Evaluation Patient Details Name: Andrew Hardy MRN: [id number] DOB: 12/29/1940 Today's Date: 04/24/2017   History of Present Illness  Pt is a 77 y/o male s/p L4-5 TLIF. PMH includes HTN.   Clinical Impression  Patient is s/p above surgery resulting in the deficits listed below (see PT Problem List). PTA, pt was independent with functional mobility. Upon eval, pt presenting with slight unsteadiness, however, had no LOB. Practiced stair navigation as well, and slightly unsteady when descending stairs requiring min to min guard A and HHA for steadying. Pt asking about using RW for increased comfort, however, anticipate pt will progress well and will likely not need, however, will reassess in next session. Reports wife will be able to assist at home. Patient will benefit from skilled PT to increase their independence and safety with mobility (while adhering to their precautions) to allow discharge to the venue listed below. Will continue to follow acutely.      Follow Up Recommendations No PT follow up;Supervision - Intermittent    Equipment Recommendations  3in1 (PT)(AD TBD in next session )    Recommendations for Other Services       Precautions / Restrictions Precautions Precautions: Back Precaution Booklet Issued: Yes (comment) Precaution Comments: Reviewed back precautions with pt.  Required Braces or Orthoses: Spinal Brace Spinal Brace: Lumbar corset;Applied in sitting position Restrictions Weight Bearing Restrictions: No      Mobility  Bed Mobility               General bed mobility comments: In chair upon entry.   Transfers Overall transfer level: Needs assistance Equipment used: None Transfers: Sit to/from Stand Sit to Stand: Min guard         General transfer comment: Min guard for safety. Verbal cues to power through LEs.   Ambulation/Gait Ambulation/Gait assistance: Min guard;Supervision Ambulation Distance (Feet): 400 Feet Assistive  device: None Gait Pattern/deviations: Step-through pattern;Decreased stride length Gait velocity: Decreased Gait velocity interpretation: Below normal speed for age/gender General Gait Details: Slow, overall steady gait, but guarded throughout. Min guard to supervision for safety. Pt reports feeling a little more unsteady and wanting RW for precautionary reasons. Anticipate pt will progress well and will not need RW at d/c, however, will evaluate in next session.   Stairs Stairs: Yes Stairs assistance: Min guard;Min assist Stair Management: No rails;Alternating pattern;Forwards;Step to pattern(HHA) Number of Stairs: 10 General stair comments: Steady ascent of stairs and required min guard assist. USe of alternating pattern to ascend stairs. Educated about using step to pattern to descend stairs to increase safety and to use HHA. Required min to min guard for steadying for descent.   Wheelchair Mobility    Modified Rankin (Stroke Patients Only)       Balance Overall balance assessment: Needs assistance Sitting-balance support: No upper extremity supported;Feet supported Sitting balance-Leahy Scale: Good     Standing balance support: No upper extremity supported;During functional activity Standing balance-Leahy Scale: Fair                               Pertinent Vitals/Pain Pain Assessment: Faces Faces Pain Scale: Hurts little more Pain Location: back  Pain Descriptors / Indicators: Aching;Operative site guarding Pain Intervention(s): Limited activity within patient's tolerance;Monitored during session;Repositioned    Home Living Family/patient expects to be discharged to:: Private residence Living Arrangements: Spouse/significant other Available Help at Discharge: Family;Available 24 hours/day Type of Home: House Home Access: Stairs to enter  Entrance Stairs-Rails: None Entrance Stairs-Number of Steps: 2 Home Layout: One level Home Equipment: None       Prior Function Level of Independence: Independent               Hand Dominance        Extremity/Trunk Assessment   Upper Extremity Assessment Upper Extremity Assessment: Defer to OT evaluation    Lower Extremity Assessment Lower Extremity Assessment: Overall WFL for tasks assessed    Cervical / Trunk Assessment Cervical / Trunk Assessment: Other exceptions Cervical / Trunk Exceptions: s/p TLIF   Communication   Communication: No difficulties  Cognition Arousal/Alertness: Awake/alert Behavior During Therapy: WFL for tasks assessed/performed Overall Cognitive Status: Within Functional Limits for tasks assessed                                        General Comments General comments (skin integrity, edema, etc.): Educated about generalized walking program to perform at home.     Exercises     Assessment/Plan    PT Assessment Patient needs continued PT services  PT Problem List Decreased mobility;Decreased knowledge of precautions;Pain       PT Treatment Interventions Gait training;Stair training;Balance training;Therapeutic exercise;Therapeutic activities;Functional mobility training;Neuromuscular re-education;Patient/family education    PT Goals (Current goals can be found in the Care Plan section)  Acute Rehab PT Goals Patient Stated Goal: to go home  PT Goal Formulation: With patient Time For Goal Achievement: 05/08/17 Potential to Achieve Goals: Good    Frequency Min 5X/week   Barriers to discharge        Co-evaluation               AM-PAC PT "6 Clicks" Daily Activity  Outcome Measure Difficulty turning over in bed (including adjusting bedclothes, sheets and blankets)?: None Difficulty moving from lying on back to sitting on the side of the bed? : A Little Difficulty sitting down on and standing up from a chair with arms (e.g., wheelchair, bedside commode, etc,.)?: Unable Help needed moving to and from a bed to chair  (including a wheelchair)?: A Little Help needed walking in hospital room?: A Little Help needed climbing 3-5 steps with a railing? : A Little 6 Click Score: 17    End of Session Equipment Utilized During Treatment: Back brace;Gait belt Activity Tolerance: Patient tolerated treatment well Patient left: in chair;with call bell/phone within reach Nurse Communication: Mobility status PT Visit Diagnosis: Other abnormalities of gait and mobility (R26.89);Pain Pain - part of body: (back )    Time: 7619-5093 PT Time Calculation (min) (ACUTE ONLY): 20 min   Charges:   PT Evaluation $PT Eval Low Complexity: 1 Low     PT G Codes:        Leighton Ruff, PT, DPT  Acute Rehabilitation Services  Pager: [phone]   Rudean Hitt 04/24/2017, 6:26 PM

## 2017-04-24 NOTE — Anesthesia Procedure Notes (Signed)
Procedure Name: Intubation Date/Time: 04/24/2017 7:45 AM Performed by: Lyndle Herrlich, MD Pre-anesthesia Checklist: Patient identified, Emergency Drugs available, Suction available and Patient being monitored Patient Re-evaluated:Patient Re-evaluated prior to induction Oxygen Delivery Method: Circle system utilized Preoxygenation: Pre-oxygenation with 100% oxygen Induction Type: IV induction Laryngoscope Size: Mac and 3 Grade View: Grade III Tube type: Oral Tube size: 7.0 mm Number of attempts: 1 Airway Equipment and Method: Stylet Placement Confirmation: ETT inserted through vocal cords under direct vision,  positive ETCO2 and breath sounds checked- equal and bilateral Secured at: 22 cm Tube secured with: Tape Dental Injury: Teeth and Oropharynx as per pre-operative assessment and Injury to lip  Difficulty Due To: Difficulty was anticipated

## 2017-04-24 NOTE — Discharge Instructions (Signed)
Spinal Fusion, Care After °These instructions give you information about caring for yourself after your procedure. Your doctor may also give you more specific instructions. Call your doctor if you have any problems or questions after your procedure. °Follow these instructions at home: °Medicines °· Take over-the-counter and prescription medicines only as told by your doctor. These include any medicines for pain. °· Do not drive for 24 hours if you received a sedative. °· Do not drive or use heavy machinery while taking prescription pain medicine. °· If you were prescribed an antibiotic medicine, take it as told by your doctor. Do not stop taking the antibiotic even if you start to feel better. °Surgical Cut (Incision) Care °· Follow instructions from your doctor about how to take care of your surgical cut. Make sure you: °? Wash your hands with soap and water before you change your bandage (dressing). If you cannot use soap and water, use hand sanitizer. °? Change your bandage as told by your doctor. °? Leave stitches (sutures), skin glue, or skin tape (adhesive) strips in place. They may need to stay in place for 2 weeks or longer. If tape strips get loose and curl up, you may trim the loose edges. Do not remove tape strips completely unless your doctor says it is okay. °· Keep your surgical cut clean and dry. Do not take baths, swim, or use a hot tub until your doctor says it is okay. °· Check your surgical cut and the area around it every day for: °? Redness. °? Swelling. °? Fluid. °Physical Activity °· Return to your normal activities as told by your doctor. Ask your doctor what activities are safe for you. Rest and protect your back as much as you can. °· Follow instructions from your doctor about how to move. Use good posture to help your spine heal. °· Do not lift anything that is heavier than 8 lb (3.6 kg) or as told by your doctor until he or she says that it is safe. Do not lift anything over your  head. °· Do not twist or bend at the waist until your doctor says it is okay. °· Avoid pushing or pulling motions. °· Do not sit or lie down in the same position for long periods of time. °· Do not start to exercise until your doctor says it is okay. Ask your doctor what kinds of exercise you can do to make your back stronger. °General instructions °· If you were given a brace, use it as told by your doctor. °· Wear compression stockings as told by your doctor. °· Do not use tobacco products. These include cigarettes, chewing tobacco, or e-cigarettes. If you need help quitting, ask your doctor. °· Keep all follow-up visits as told by your doctor. This is important. This includes any visits with your physical therapist, if this applies. °Contact a doctor if: °· Your pain gets worse. °· Your medicine does not help your pain. °· Your legs or feet become painful or swollen. °· Your surgical cut is red, swollen, or painful. °· You have fluid, blood, or pus coming from your surgical cut. °· You feel sick to your stomach (nauseous). °· You throw up (vomit). °· Your have weakness or loss of feeling (numbness) in your legs that is new or getting worse. °· You have a fever. °· You have trouble controlling when you pee (urinate) or poop (have a bowel movement). °Get help right away if: °· Your pain is very bad. °· You have   chest pain. °· You have trouble breathing. °· You start to have a cough. °These symptoms may be an emergency. Do not wait to see if the symptoms will go away. Get medical help right away. Call your local emergency services (911 in the [place] Do not drive yourself to the hospital. °This information is not intended to replace advice given to you by your health care provider. Make sure you discuss any questions you have with your health care provider. °Document Released: 08/02/2010 Document Revised: 12/05/2015 Document Reviewed: 09/21/2014 °Elsevier Interactive Patient Education © 2018 [employer] ° °

## 2017-04-24 NOTE — H&P (Signed)
History of Present Illness  The patient is a 77 year old male who presents for a Follow-up for H & P. The patient is scheduled for a TLIF L4-5 to be performed by Dr. Duane Lope D. Rolena Infante, MD at [facility] on 04-24-17 . Please see the hospital record for complete dictated history and physical. Pt reports hx of 6 stents in his cardiac vascularture. His last stent was placed in January of 2018. The pt has stopped the plavix and ASA.  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 Percocet *ANALGESICS - OPIOID*  Itching. Allergies Reconciled   Family History  Chronic Obstructive Lung Disease  Mother. mother Hypertension  Mother. mother Heart Disease  mother and father Cancer  Father, First Degree Relatives. father  Social History Tobacco use  Former smoker. Tobacco / smoke exposure  Pain Contract  no Marital status  married No history of drug/alcohol rehab  Exercise  Exercises daily; does other Exercises daily; does running / walking Living situation  live with spouse Not under pain contract  Number of flights of stairs before winded  greater than 5 Drug/Alcohol Rehab (Currently)  no Illicit drug use  no Drug/Alcohol Rehab (Previously)  no Current  drinker  11/11/2013: Currently drinks beer 5-7 times per week Current work status  working full time Alcohol use  Currently drinks alcohol. current drinker; drinks beer; 8-14 per week current drinker; drinks beer; less than 5 per week Children  2  Medication History Amiodarone HCl (200MG  Tablet, Oral) Active. Minocycline HCl (50MG  Capsule, Oral) Active. (qd) Finasteride (5MG  Tablet, Oral) Active. (qd) Tamsulosin HCl (0.4MG  Capsule, Oral) Active. (qd) TraZODone HCl (100MG  Tablet, Oral) Active. (qd) Ezetimibe (10MG  Tablet, Oral) Active. (qd) Simvastatin (40MG  Tablet, Oral) Active. Nitrostat (0.4MG  Tab Sublingual, Sublingual) Active. Isosorbide Mononitrate ER (60MG  Tablet ER 24HR, Oral) Active. Furosemide (80MG  Tablet, Oral) Active. (qd) AmLODIPine Besylate (5MG  Tablet, Oral) Active. Aspirin (81MG  Tablet, 1 Oral) Active. Plavix (75MG  Tablet, Oral) Active. Potassium Chloride (20MEQ Tablet ER, Oral) Active. NexIUM (40MG  Capsule DR, Oral) Active. HydrALAZINE HCl (25MG  Tablet, Oral) Active. Metoprolol Tartrate (25MG  Tablet, Oral) Active. (qd) Tylenol (325MG  Tablet, Oral) Active. Medications Reconciled  Past Surgical History  Rotator Cuff Repair  bilateral Arthroscopy of Knee  left Heart Stents  Cataract Surgery  bilateral Tonsillectomy   Other Problems Gastroesophageal Reflux Disease  High blood pressure  Myocardial infarction  Congestive Heart Failure  Other disease, cancer, significant illness  Hypercholesterolemia   Vitals 04/18/2017 9:37 AM Weight: 238 lb Height: 67in Body Surface Area: 2.18 m Body Mass Index: 37.28 kg/m  Temp.: 97.73F(Oral)  Pulse: 60 (Regular)  BP: 134/52 (Sitting, Right Arm, Standard)  General General Appearance-Not in acute distress. Orientation-Oriented X3. Build & Nutrition-Well nourished and Well developed.  Integumentary General Characteristics Surgical Scars - no surgical scar evidence  of previous lumbar surgery. Lumbar Spine-Skin examination of the lumbar spine is without deformity, skin lesions,  lacerations or abrasions.  Chest and Lung Exam Auscultation Breath sounds - Normal and Clear.  Cardiovascular Auscultation Rhythm - Regular rate and rhythm.  Abdomen Palpation/Percussion Palpation and Percussion of the abdomen reveal - Soft, Non Tender and No Rebound tenderness.  Peripheral Vascular Lower Extremity Palpation - Posterior tibial pulse - Bilateral - 2+. Dorsalis pedis pulse - Bilateral - 2+.  Neurologic Sensation Lower Extremity - Bilateral - sensation is intact in the lower extremity. Reflexes Patellar Reflex - Bilateral - 2+. Achilles Reflex - Bilateral - 2+. Clonus - Bilateral - clonus not present. Hoffman's Sign - Bilateral - Hoffman's sign not present. Testing Seated Straight Leg Raise - Bilateral - Seated straight leg raise negative.  Musculoskeletal Spine/Ribs/Pelvis  Lumbosacral Spine: Inspection and Palpation - Tenderness - left lumbar paraspinals tender to palpation and right lumbar paraspinals tender to palpation. Strength and Tone: Strength - Hip Flexion - Bilateral - 5/5. Knee Extension - Bilateral - 5/5. Knee Flexion - Bilateral - 5/5. Ankle Dorsiflexion - Bilateral - 5/5. Ankle Plantarflexion - Bilateral - 5/5. Heel walk - Bilateral - able to heel walk with moderate difficulty. Toe Walk - Bilateral - able to walk on toes with moderate difficulty. ROM - Flexion - moderately decreased range of motion and painful. Extension - moderately decreased range of motion and painful. Left Lateral Bending - moderately decreased range of motion and painful. Right Lateral Bending - moderately decreased range of motion and painful. Right Rotation - moderately decreased range of motion and painful. Left Rotation - moderately decreased range of motion and painful. Pain - neither flexion or extension is more painful than the other. Lumbosacral Spine - Waddell's  Signs - no Waddell's signs present. Lower Extremity Range of Motion - No true hip, knee or ankle pain with range of motion. Gait and Station - [employer] - no assistive devices.  MRI is significant at L4-5 for a small central disc extrusion moderate disc bulge moderate right disc osteophyte complex moderate right and mild left facet arthrosis similar severe right foraminal narrowing with encroachment upon the exiting L4 nerve root narrowing with abutment of L5 nerve roots as well  Assessment & Plan  Goal Of Surgery: Discussed that goal of surgery is to reduce pain and improve function and quality of life. Patient is aware that despite all appropriate treatment that there pain and function could be the same, worse, or different. Posterior decompression/Fusion:Risks of surgery include infection, bleeding, nerve damage, death, stroke, paralysis, failure to heal, need for further surgery, ongoing or worse pain, need for further surgery, CSF leak, loss of bowel or bladder, and recurrent disc herniation or stenosis which would necessitate need for further surgery. Non-union, hardware failure, adjacent segment disease and recurrent pain. Hardware breakage, mal-position requiring surgery to correct or remove.  No significant change in the patient's clinical exam from his last office visit.  He continues to have significant radicular right leg pain, and debilitating back pain.  Patient has significant foraminal stenosis as well as lateral recess stenosis.  Plan will be to move forward with a transforaminal lumbar interbody fusion to address the degenerative disc disease, and adequately decompress the foramen and lateral recess.

## 2017-04-24 NOTE — Brief Op Note (Signed)
04/24/2017  11:09 AM  PATIENT:  Andrew Hardy  77 y.o. 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

## 2017-04-25 ENCOUNTER — Encounter (HOSPITAL_COMMUNITY): Payer: Self-pay | Admitting: Orthopedic Surgery

## 2017-04-25 MED FILL — Thrombin (Recombinant) For Soln 5000 Unit: CUTANEOUS | Qty: 5000 | Status: AC

## 2017-04-25 NOTE — Progress Notes (Signed)
Physical Therapy Treatment and Discharge Patient Details Name: Andrew Hardy MRN: [id number] DOB: [birth year] Today's Date: 04/25/2017    History of Present Illness Pt is a 77 y/o male s/p L4-5 TLIF. PMH includes HTN.     PT Comments    Pt progressing well with mobility, and has met acute PT goals/completed education. Pt very clear that he did not feel he needed further PT at this time. Pt was educated on general precautions, car transfer, and activity progression. Pt and family anticipate d/c home this morning. Will sign off at this time. If needs change, please reconsult.   Follow Up Recommendations  No PT follow up;Supervision - Intermittent     Equipment Recommendations  3in1 (PT)    Recommendations for Other Services       Precautions / Restrictions Precautions Precautions: Back Precaution Booklet Issued: Yes (comment) Precaution Comments: Pt was able to recall 0/3 precautions without cues. Pt was educated on 3/3 precautions.  Required Braces or Orthoses: Spinal Brace Spinal Brace: Lumbar corset;Applied in sitting position Restrictions Weight Bearing Restrictions: No    Mobility  Bed Mobility               General bed mobility comments: Pt OOB in chair upon arrival  Transfers Overall transfer level: Modified independent Equipment used: None Transfers: Sit to/from Stand Sit to Stand: Supervision         General transfer comment: for safety, good hand placement and technique  Ambulation/Gait Ambulation/Gait assistance: Modified independent (Device/Increase time) Ambulation Distance (Feet): 400 Feet Assistive device: None Gait Pattern/deviations: Wide base of support;Trendelenburg Gait velocity: Decreased Gait velocity interpretation: Below normal speed for age/gender General Gait Details: Steady without UE support. Pt continuously states that he has already walked this morning. Pt and family were educated on recommended walking program/activity progression.     Stairs         General stair comments: Pt declined stair training.   Wheelchair Mobility    Modified Rankin (Stroke Patients Only)       Balance Overall balance assessment: Needs assistance Sitting-balance support: Feet supported;No upper extremity supported Sitting balance-Leahy Scale: Good     Standing balance support: No upper extremity supported;During functional activity Standing balance-Leahy Scale: Good                              Cognition Arousal/Alertness: Awake/alert Behavior During Therapy: WFL for tasks assessed/performed Overall Cognitive Status: Within Functional Limits for tasks assessed                                        Exercises      General Comments        Pertinent Vitals/Pain Pain Assessment: Faces Faces Pain Scale: Hurts a little bit Pain Location: back Pain Descriptors / Indicators: Sore;Operative site guarding Pain Intervention(s): Limited activity within patient's tolerance;Monitored during session;Repositioned    Home Living Family/patient expects to be discharged to:: Private residence Living Arrangements: Spouse/significant other Available Help at Discharge: Family;Available 24 hours/day Type of Home: House Home Access: Stairs to enter Entrance Stairs-Rails: None Home Layout: One level Home Equipment: None      Prior Function Level of Independence: Independent          PT Goals (current goals can now be found in the care plan section) Acute Rehab PT Goals Patient Stated Goal: to  go home  PT Goal Formulation: With patient Time For Goal Achievement: 05/08/17 Potential to Achieve Goals: Good Progress towards PT goals: Goals met/education completed, patient discharged from PT    Frequency    Min 5X/week      PT Plan Current plan remains appropriate    Co-evaluation              AM-PAC PT "6 Clicks" Daily Activity  Outcome Measure  Difficulty turning over in bed  (including adjusting bedclothes, sheets and blankets)?: None Difficulty moving from lying on back to sitting on the side of the bed? : None Difficulty sitting down on and standing up from a chair with arms (e.g., wheelchair, bedside commode, etc,.)?: None Help needed moving to and from a bed to chair (including a wheelchair)?: None Help needed walking in hospital room?: None Help needed climbing 3-5 steps with a railing? : None 6 Click Score: 24    End of Session Equipment Utilized During Treatment: Back brace Activity Tolerance: Patient tolerated treatment well Patient left: in chair;with call bell/phone within reach Nurse Communication: Mobility status PT Visit Diagnosis: Other abnormalities of gait and mobility (R26.89);Pain Pain - part of body: (back)     Time: 5053-9767 PT Time Calculation (min) (ACUTE ONLY): 9 min  Charges:  $Gait Training: 8-22 mins                    G Codes:       [name], PT, DPT Acute Rehabilitation Services Pager: [phone]    [name] 04/25/2017, 9:10 AM

## 2017-04-25 NOTE — Progress Notes (Addendum)
    Subjective: Procedure(s) (LRB): TRANSFORAMINAL LUMBAR INTERBODY FUSION (TLIF) L4-5 (N/A) 1 Day Post-Op  Patient reports pain as 2 on 0-10 scale.  Reports decreased leg pain reports incisional back pain   Positive void Negative bowel movement Negative flatus Negative chest pain or shortness of breath  Objective: Vital signs in last 24 hours: Temp:  [97.7 F (36.5 C)-98.4 F (36.9 C)] 97.9 F (36.6 C) (01/04 0345) Pulse Rate:  [60-88] 70 (01/04 0345) Resp:  [12-20] 18 (01/04 0345) BP: (107-149)/(52-79) 149/68 (01/04 0345) SpO2:  [93 %-98 %] 96 % (01/04 0345)  Body mass index is 35.24 kg/m.   Intake/Output from previous day: 01/03 0701 - 01/04 0700 In: 1903 [I.V.:1303; IV Piggyback:600] Out: 330 [Urine:180; Blood:150]  Labs: No results for input(s): WBC, RBC, HCT, PLT in the last 72 hours. No results for input(s): NA, K, CL, CO2, BUN, CREATININE, GLUCOSE, CALCIUM in the last 72 hours. No results for input(s): LABPT, INR in the last 72 hours.  Physical Exam: Neurologically intact Neurovascular intact Dorsiflexion/Plantar flexion intact Incision: dressing C/D/I and no drainage Compartment soft  Assessment/Plan: Patient stable  xrays n/a Continue mobilization with physical therapy Continue care  Advance diet Up with therapy  Plan on d/c today if positive flatus/BM Ambulating well - will also arrange 3n1 chair  Melina Schools, [place] [phone]

## 2017-04-25 NOTE — Progress Notes (Signed)
Patient is discharged from room 3C09 at this time. Alert and in stable condition. IV site d/c'd and instructions read to patient and family with understanding verbalized. Left unit via wheelchair with all belongings at side. 

## 2017-04-25 NOTE — Evaluation (Signed)
Occupational Therapy Evaluation and Discharge Patient Details Name: Andrew Hardy MRN: [id number] DOB: [birth year] Today's Date: 04/25/2017    History of Present Illness Pt is a 77 y/o male s/p L4-5 TLIF. PMH includes HTN.    Clinical Impression   Pt reports he was independent with ADL PTA. Currently pt supervision with ADL and functional mobility with the exception of min assist for LB ADL. All back, safety, and ADL education completed with pt and family. Pt planning to d/c home with 24/7 supervision from family. No further acute OT needs identified; signing off at this time. Please re-consult if needs change. Thank you for this referral.    Follow Up Recommendations  No OT follow up;Supervision/Assistance - 24 hour    Equipment Recommendations  3 in 1 bedside commode    Recommendations for Other Services       Precautions / Restrictions Precautions Precautions: Back Precaution Booklet Issued: No Precaution Comments: Reviewed precautions with pt and family Required Braces or Orthoses: Spinal Brace Spinal Brace: Lumbar corset;Applied in sitting position Restrictions Weight Bearing Restrictions: No      Mobility Bed Mobility               General bed mobility comments: Pt OOB in chair upon arrival  Transfers Overall transfer level: Needs assistance Equipment used: None Transfers: Sit to/from Stand Sit to Stand: Supervision         General transfer comment: for safety, good hand placement and technique    Balance Overall balance assessment: Needs assistance Sitting-balance support: Feet supported;No upper extremity supported Sitting balance-Leahy Scale: Good     Standing balance support: No upper extremity supported;During functional activity Standing balance-Leahy Scale: Good                             ADL either performed or assessed with clinical judgement   ADL Overall ADL's : Needs assistance/impaired Eating/Feeding: Independent;Sitting   Grooming: Supervision/safety;Standing Grooming Details (indicate cue type and reason): Educated on use of 2 cups for oral care Upper Body Bathing: Set up;Sitting   Lower Body Bathing: Minimal assistance;Sit to/from stand   Upper Body Dressing : Set up;Sitting Upper Body Dressing Details (indicate cue type and reason): reports he is able to don/doff brace independently Lower Body Dressing: Minimal assistance;Sit to/from stand Lower Body Dressing Details (indicate cue type and reason): Wife to assist with LB ADL as needed Toilet Transfer: Supervision/safety;Ambulation Toilet Transfer Details (indicate cue type and reason): Simulated by sit to stand from chair with functional mobility   Toileting - Clothing Manipulation Details (indicate cue type and reason): Pt reports wife to assist as needed. Educated on proper technique for peri care without twisting and use of wet wipes Tub/ Shower Transfer: Supervision/safety;Walk-in shower;Ambulation Tub/Shower Transfer Details (indicate cue type and reason): Educated on use of 3 in 1 in shower as a seat, recommend supervision initially Functional mobility during ADLs: Supervision/safety General ADL Comments: Educated pt on maintaining back precuations during functional activities, keeping frequently used items at counter top height, frequent mobility throughout the day upon return home.     Vision         Perception     Praxis      Pertinent Vitals/Pain Pain Assessment: Faces Faces Pain Scale: Hurts little more Pain Location: back Pain Descriptors / Indicators: Sore Pain Intervention(s): Monitored during session     Hand Dominance     Extremity/Trunk Assessment Upper Extremity Assessment Upper Extremity Assessment:  Overall [facility] for tasks assessed   Lower Extremity Assessment Lower Extremity Assessment: Defer to PT evaluation   Cervical / Trunk Assessment Cervical / Trunk Assessment: Other exceptions Cervical / Trunk Exceptions:  s/p TLIF    Communication Communication Communication: No difficulties   Cognition Arousal/Alertness: Awake/alert Behavior During Therapy: WFL for tasks assessed/performed Overall Cognitive Status: Within Functional Limits for tasks assessed                                     General Comments       Exercises     Shoulder Instructions      Home Living Family/patient expects to be discharged to:: Private residence Living Arrangements: Spouse/significant other Available Help at Discharge: Family;Available 24 hours/day Type of Home: House Home Access: Stairs to enter [employer] of Steps: 2 Entrance Stairs-Rails: None Home Layout: One level     Bathroom Shower/Tub: Occupational psychologist: Standard     Home Equipment: None          Prior Functioning/Environment Level of Independence: Independent                 OT Problem List:        OT Treatment/Interventions:      OT Goals(Current goals can be found in the care plan section) Acute Rehab OT Goals Patient Stated Goal: to go home  OT Goal Formulation: All assessment and education complete, DC therapy  OT Frequency:     Barriers to D/C:            Co-evaluation              AM-PAC PT "6 Clicks" Daily Activity     Outcome Measure Help from another person eating meals?: None Help from another person taking care of personal grooming?: A Little Help from another person toileting, which includes using toliet, bedpan, or urinal?: A Little Help from another person bathing (including washing, rinsing, drying)?: A Little Help from another person to put on and taking off regular upper body clothing?: None Help from another person to put on and taking off regular lower body clothing?: A Little 6 Click Score: 20   End of Session Equipment Utilized During Treatment: Back brace Nurse Communication: Mobility status;Other (comment)(eqipment and f/u needs)  Activity  Tolerance: Patient tolerated treatment well Patient left: in chair;with call bell/phone within reach;with family/visitor present  OT Visit Diagnosis: Unsteadiness on feet (R26.81);Pain Pain - part of body: (back)                Time: 7544-9201 OT Time Calculation (min): 12 min Charges:  OT General Charges $OT Visit: 1 Visit OT Evaluation $OT Eval Low Complexity: 1 Low G-Codes:     Rosalina Dingwall A. Ulice Brilliant, M.S., OTR/L Pager: [place] 04/25/2017, 8:55 AM

## 2017-04-28 NOTE — Discharge Summary (Signed)
Physician Discharge Summary  Patient ID: Andrew Hardy MRN: [id number] DOB/AGE: 08-03-1940 77 y.o.  Admit date: 04/24/2017 Discharge date: 04/28/2017  Admission Diagnoses:  Lumbar DDD  Discharge Diagnoses:  Active Problems:   S/P lumbar fusion   Past Medical History:  Diagnosis Date  . Anxiety   . Arthritis   . Atypical atrial flutter ([place]) 12/14/2014  . Bradycardia, sinus 10/15/2014  . Burning sensation of feet 01/17/2016  . 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 Successful PTCA of the ostial 1st Diagonal Coronary Artery  . 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 Coronary artery disease 2003   s/p MI and numerous stents  . Depression   . Enlarged prostate without lower urinary tract symptoms (luts) 2012   frequent urination and nocturia; sees dr [name] Late in [place]  . Essential hypertension 10/15/2014  . Gastroesophageal reflux disease without esophagitis 04/21/2016  . GERD (gastroesophageal reflux disease)   . Hyperlipidemia   . Hypertension   . IHD (ischemic heart disease) 10/15/2014  . Lumbar radiculopathy 02/21/2016  . Myocardial infarction ([place]) 2003  . NSTEMI (non-ST elevated myocardial infarction) ([place]) 04/24/2016   Overview:  LHC 04/24/15: Severe stenosis of the LAD & Diagonal bifurcation  LV ejection fraction is 55-60 %  Interventional Summary Successful Angiosculpt PCI / Resolute Drug Eluting Stent of the proximal LAD Successful PTCA of the ostial 1st Diagonal Coronary Artery  . Pain in both feet 01/17/2016  . Paroxysmal SVT (supraventricular tachycardia) ([place]) 11/16/2014  . Precordial pain 10/15/2014  . PVC's  (premature ventricular contractions) 04/22/2016  . RBBB 10/15/2014  . Sleep disturbances 01/17/2016  . Snoring 01/17/2016  . Subscapularis (muscle) sprain 03/29/2011  . 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 male who was admitted 04/24/2017 with a diagnosis of Lumbar DDD and went to the operating room on 04/24/2017 and underwent the above named procedures.  Post op day 2 pt reports a low level of pain controlled on oral medication.  Pt denies issue with urination or nausea. Pt ambulating in hallway.  Pt cleared by PT before DC.   He was given perioperative antibiotics:  Anti-infectives (From admission, onward)   Start     Dose/Rate Route Frequency Ordered Stop   04/24/17 1500  ceFAZolin (ANCEF) IVPB 2g/100 mL premix     2 g 200 mL/hr over 30 Minutes Intravenous Every 8 hours 04/24/17 1232 04/24/17 2233   04/24/17 0548  ceFAZolin (ANCEF) IVPB 2g/100 mL premix     2 g 200 mL/hr over 30 Minutes Intravenous 30 min pre-op 04/24/17 0548 04/24/17 0747    .  He was given sequential compression devices, early ambulation, and TED for DVT prophylaxis.  He benefited maximally from the hospital stay and there were no complications.    Recent vital signs:  Vitals:   04/25/17 0345 04/25/17 0812  BP: (!) 149/68 (!) 141/80  Pulse: 70 60  Resp: 18 16  Temp: 97.9 F (36.6 C) 98.1 F (36.7 C)  SpO2:  96% 97%    Recent laboratory studies:  Lab Results  Component Value Date   HGB 12.8 (L) 04/18/2017   HGB 12.2 (L) 03/30/2011   HGB 13.5 03/21/2011   Lab Results  Component Value Date   WBC 6.9 04/18/2017   PLT 197 04/18/2017   Lab Results  Component Value Date   INR 0.90 03/21/2011   Lab Results  Component Value Date   NA 136 04/18/2017   K 4.4 04/18/2017   CL 104 04/18/2017   CO2 23  04/18/2017   BUN 18 04/18/2017   CREATININE 1.04 04/18/2017   GLUCOSE 173 (H) 04/18/2017    Discharge Medications:   Allergies as of 04/25/2017      Reactions   Morphine And Related Other (See Comments)   Pt states morphine is not effective for pain management   Buprenorphine Hcl Itching   Codeine Itching   Hydrocodone Itching   Hydrocodone-acetaminophen Itching   Morphine Other (See Comments)   And related; pt states morphine is not effective for pain management   Percocet [oxycodone-acetaminophen] Itching   Pt.states he can take but must take with benadryl      Medication List    STOP taking these medications   aspirin 81 MG tablet     TAKE these medications   acetaminophen 500 MG tablet Commonly known as:  TYLENOL Take 1,000 mg by mouth at bedtime.   B-12 PO Take 1 tablet by mouth daily.   clopidogrel 75 MG tablet Commonly known as:  PLAVIX Take 75 mg by mouth daily.   Co-Enzyme Q-10 30 MG Caps Take 100 mg by mouth daily.   doxycycline 50 MG capsule Commonly known as:  VIBRAMYCIN Take 2 capsules (100 mg total) by mouth 2 (two) times daily.   EPIPEN 2-PAK 0.3 mg/0.3 mL Soaj injection Generic drug:  EPINEPHrine   esomeprazole 40 MG capsule Commonly known as:  NEXIUM Take 40 mg by mouth daily before breakfast.   ezetimibe 10 MG tablet Commonly known as:  ZETIA Take 10 mg by mouth every evening.   finasteride 5 MG tablet Commonly known as:  PROSCAR Take 5 mg by mouth daily.   FISH OIL PO Take 1 tablet by mouth daily.   FLAXSEED OIL MAX STR 1300 MG Caps Take 1,300 mg by mouth daily.   furosemide 80 MG tablet Commonly known as:  LASIX Take 80 mg by mouth.   furosemide 80 MG tablet Commonly known as:  LASIX Take 40-80 mg by mouth daily. Alternate taking  (0.5 tablet) 40mg  one day and (1 tablet) 80mg  another day   furosemide 40 MG tablet Commonly known as:  LASIX Take 1-2 tablets (40-80 mg total) by mouth See admin instructions. Alternate taking   40mg  one day and 80mg  another day   GLUCOSAMINE PO Take 1 tablet by mouth daily.   HYDROmorphone 2 MG tablet Commonly known as:  DILAUDID Take 1 tablet (2 mg total) by mouth every 6 (six) hours as needed for up to 7 days for severe pain.   isosorbide mononitrate 60 MG 24 hr tablet Commonly known as:  IMDUR Take 1 tablet (60 mg total) by mouth daily.   magnesium oxide 400 MG tablet Commonly known as:  MAG-OX Take 400 mg by mouth daily.   methocarbamol 500 MG tablet Commonly known as:  ROBAXIN Take 1 tablet (500 mg total) by mouth 3 (three) times daily.   metoprolol succinate 50 MG 24 hr tablet Commonly known as:  TOPROL-XL Take 25  mg by mouth daily. Take with or immediately following a meal.   multivitamins ther. w/minerals Tabs tablet Take 1 tablet by mouth daily.   nitroGLYCERIN 0.4 MG SL tablet Commonly known as:  NITROSTAT TAKE 1 TABLET UNDER THE TONGUE AS NEEDED AS DIRECTED FOR CHEST PAIN. What changed:    how much to take  how to take this  when to take this  reasons to take this  additional instructions   ondansetron 4 MG disintegrating tablet Commonly known as:  ZOFRAN ODT Take 1 tablet (4 mg total) by mouth every 8 (eight) hours as needed for nausea or vomiting.   potassium chloride SA 20 MEQ tablet Commonly known as:  K-DUR,KLOR-CON Take 1 tablet (20 mEq total) 2 (two) times daily by mouth.   Pumpkin Seed Oil Caps Take 1 capsule by mouth daily.   simvastatin 40 MG tablet Commonly known as:  ZOCOR Take 1 tablet (40 mg total) at bedtime by mouth.   tamsulosin 0.4 MG Caps capsule Commonly known as:  FLOMAX Take 0.4 mg by mouth daily.   vitamin E 400 UNIT capsule Take 400 Units by mouth daily.       Diagnostic Studies: Dg Lumbar Spine 2-3 Views  Result Date: 04/24/2017 CLINICAL DATA:  TLIF L4-L5 EXAM: DG C-ARM GT 120 MIN; LUMBAR SPINE - 2-3 VIEW CONTRAST:  None FLUOROSCOPY TIME:  Fluoroscopy Time:  2 minutes 35 seconds Radiation Exposure Index  (if provided by the fluoroscopic device): Not provided Number of Acquired Spot Images: 2 COMPARISON:  MR abdomen 03/31/2008 FINDINGS: Prior MRI labeled with 5 lumbar vertebra. Submitted images demonstrate BILATERAL pedicle screws and posterior bars at L4-L5 post fusion. Intervening disc prosthesis at the L4-L5 disc space. Diffuse osseous demineralization. IMPRESSION: Posterior L4-L5 fusion. Electronically Signed   By: Lavonia Dana M.D.   On: 04/24/2017 11:01   Dg C-arm Gt 120 Min  Result Date: 04/24/2017 CLINICAL DATA:  TLIF L4-L5 EXAM: DG C-ARM GT 120 MIN; LUMBAR SPINE - 2-3 VIEW CONTRAST:  None FLUOROSCOPY TIME:  Fluoroscopy Time:  2 minutes 35 seconds Radiation Exposure Index (if provided by the fluoroscopic device): Not provided Number of Acquired Spot Images: 2 COMPARISON:  MR abdomen 03/31/2008 FINDINGS: Prior MRI labeled with 5 lumbar vertebra. Submitted images demonstrate BILATERAL pedicle screws and posterior bars at L4-L5 post fusion. Intervening disc prosthesis at the L4-L5 disc space. Diffuse osseous demineralization. IMPRESSION: Posterior L4-L5 fusion. Electronically Signed   By: Lavonia Dana M.D.   On: 04/24/2017 11:01    Disposition: 01-Home or Self Care Pt will present to clinic in 2 weeks Post op medication provided Discharge Instructions    Incentive spirometry RT   Complete by:  As directed       Follow-up Information    Melina Schools, MD. Schedule an appointment as soon as possible for a visit in 2 week(s).   Specialty:  Orthopedic Surgery Contact information: [address] [place] [address] [phone]            Signed: Valinda Hoar 04/28/2017, 2:17 PM

## 2017-04-29 ENCOUNTER — Encounter (HOSPITAL_COMMUNITY): Payer: Self-pay | Admitting: Orthopedic Surgery

## 2017-05-02 DIAGNOSIS — M5136 Other intervertebral disc degeneration, lumbar region: Secondary | ICD-10-CM

## 2017-05-02 DIAGNOSIS — M4310 Spondylolisthesis, site unspecified: Secondary | ICD-10-CM

## 2017-05-02 DIAGNOSIS — M51369 Other intervertebral disc degeneration, lumbar region without mention of lumbar back pain or lower extremity pain: Secondary | ICD-10-CM

## 2017-05-02 HISTORY — DX: Other intervertebral disc degeneration, lumbar region: M51.36

## 2017-05-02 HISTORY — DX: Spondylolisthesis, site unspecified: M43.10

## 2017-05-02 HISTORY — DX: Other intervertebral disc degeneration, lumbar region without mention of lumbar back pain or lower extremity pain: M51.369

## 2017-05-12 ENCOUNTER — Telehealth: Payer: Self-pay | Admitting: Cardiology

## 2017-05-12 MED ORDER — EZETIMIBE 10 MG PO TABS: 10.0000 mg | ORAL_TABLET | Freq: Every evening | ORAL | 2 refills | Status: DC

## 2017-05-12 MED ORDER — FUROSEMIDE 80 MG PO TABS: 40.0000 mg | ORAL_TABLET | Freq: Every day | ORAL | 2 refills | Status: DC

## 2017-05-12 MED ORDER — CLOPIDOGREL BISULFATE 75 MG PO TABS: 75.0000 mg | ORAL_TABLET | Freq: Every day | ORAL | 2 refills | Status: DC

## 2017-05-12 NOTE — Telephone Encounter (Signed)
Refill sent.

## 2017-05-12 NOTE — Telephone Encounter (Signed)
°*  STAT* If patient is at the pharmacy, call can be transferred to refill team.   1. Which medications need to be refilled? (please list name of each medication and dose if known)  *STAT* If patient is at the pharmacy, call can be transferred to refill team.   1. Which medications need to be refilled? (please list name of each medication and dose if known) Lasix   2. Which pharmacy/location (including street and city if local pharmacy) is medication to be sent to? [facility] in [place]  3. Do they need a 30 day or 90 day supply? 90   2. Which pharmacy/location (including street and city if local pharmacy) is medication to be sent to?[facility] 75mg    3. Do they need a 30 day or 90 day supply? [facility] [place]   *STAT* If patient is at the pharmacy, call can be transferred to refill team.   1. Which medications need to be refilled? (please list name of each medication and dose if known) Zetia 10mg    2. Which pharmacy/location (including street and city if local pharmacy) is medication to be sent to?[facility] [place]   3. Do they need a 30 day or 90 day supply? 90  Patient is changing her pharmacy this year to [facility].

## 2017-06-06 DIAGNOSIS — Z4789 Encounter for other orthopedic aftercare: Secondary | ICD-10-CM | POA: Diagnosis not present

## 2017-06-06 DIAGNOSIS — M5442 Lumbago with sciatica, left side: Secondary | ICD-10-CM | POA: Diagnosis not present

## 2017-06-06 DIAGNOSIS — Z4889 Encounter for other specified surgical aftercare: Secondary | ICD-10-CM | POA: Diagnosis not present

## 2017-06-11 DIAGNOSIS — M2560 Stiffness of unspecified joint, not elsewhere classified: Secondary | ICD-10-CM | POA: Diagnosis not present

## 2017-06-11 DIAGNOSIS — M4326 Fusion of spine, lumbar region: Secondary | ICD-10-CM | POA: Diagnosis not present

## 2017-06-11 DIAGNOSIS — M545 Low back pain: Secondary | ICD-10-CM | POA: Diagnosis not present

## 2017-06-11 DIAGNOSIS — M6281 Muscle weakness (generalized): Secondary | ICD-10-CM | POA: Diagnosis not present

## 2017-06-13 DIAGNOSIS — M545 Low back pain: Secondary | ICD-10-CM | POA: Diagnosis not present

## 2017-06-13 DIAGNOSIS — M2560 Stiffness of unspecified joint, not elsewhere classified: Secondary | ICD-10-CM | POA: Diagnosis not present

## 2017-06-13 DIAGNOSIS — M4326 Fusion of spine, lumbar region: Secondary | ICD-10-CM | POA: Diagnosis not present

## 2017-06-13 DIAGNOSIS — M6281 Muscle weakness (generalized): Secondary | ICD-10-CM | POA: Diagnosis not present

## 2017-06-16 DIAGNOSIS — M545 Low back pain: Secondary | ICD-10-CM | POA: Diagnosis not present

## 2017-06-16 DIAGNOSIS — M6281 Muscle weakness (generalized): Secondary | ICD-10-CM | POA: Diagnosis not present

## 2017-06-16 DIAGNOSIS — M2560 Stiffness of unspecified joint, not elsewhere classified: Secondary | ICD-10-CM | POA: Diagnosis not present

## 2017-06-16 DIAGNOSIS — M4326 Fusion of spine, lumbar region: Secondary | ICD-10-CM | POA: Diagnosis not present

## 2017-06-19 DIAGNOSIS — Z981 Arthrodesis status: Secondary | ICD-10-CM | POA: Diagnosis not present

## 2017-07-09 DIAGNOSIS — K219 Gastro-esophageal reflux disease without esophagitis: Secondary | ICD-10-CM | POA: Diagnosis not present

## 2017-07-09 DIAGNOSIS — K5730 Diverticulosis of large intestine without perforation or abscess without bleeding: Secondary | ICD-10-CM | POA: Diagnosis not present

## 2017-07-09 DIAGNOSIS — K2270 Barrett's esophagus without dysplasia: Secondary | ICD-10-CM | POA: Diagnosis not present

## 2017-07-18 DIAGNOSIS — Z981 Arthrodesis status: Secondary | ICD-10-CM | POA: Diagnosis not present

## 2017-07-18 DIAGNOSIS — M4326 Fusion of spine, lumbar region: Secondary | ICD-10-CM | POA: Diagnosis not present

## 2017-07-18 DIAGNOSIS — M4310 Spondylolisthesis, site unspecified: Secondary | ICD-10-CM | POA: Diagnosis not present

## 2017-07-18 DIAGNOSIS — M5136 Other intervertebral disc degeneration, lumbar region: Secondary | ICD-10-CM | POA: Diagnosis not present

## 2017-07-31 ENCOUNTER — Encounter: Payer: Self-pay | Admitting: Cardiology

## 2017-07-31 ENCOUNTER — Ambulatory Visit (INDEPENDENT_AMBULATORY_CARE_PROVIDER_SITE_OTHER): Payer: Medicare Other | Admitting: Cardiology

## 2017-07-31 VITALS — BP 118/72 | HR 56 | Ht 67.0 in | Wt 223.5 lb

## 2017-07-31 DIAGNOSIS — E785 Hyperlipidemia, unspecified: Secondary | ICD-10-CM | POA: Diagnosis not present

## 2017-07-31 DIAGNOSIS — I10 Essential (primary) hypertension: Secondary | ICD-10-CM | POA: Diagnosis not present

## 2017-07-31 DIAGNOSIS — I25119 Atherosclerotic heart disease of native coronary artery with unspecified angina pectoris: Secondary | ICD-10-CM

## 2017-07-31 DIAGNOSIS — I484 Atypical atrial flutter: Secondary | ICD-10-CM

## 2017-07-31 NOTE — Progress Notes (Signed)
Cardiology Office Note:    Date:  07/31/2017   ID:  MIN COLLYMORE, DOB [birth year], MRN [id number]  PCP:  Lillard Anes, MD  Cardiologist:  Shirlee More, MD    Referring MD: Lillard Anes,*    ASSESSMENT:    1. Coronary artery disease involving native coronary artery of native heart with angina pectoris ([place])   2. Atypical atrial flutter (HCC)   3. Hyperlipidemia, unspecified hyperlipidemia type   4. Essential hypertension    PLAN:    In order of problems listed above:  1. Stable continue current medical treatment.  At this time I do not think he requires an ischemia evaluation.  He has more than a year out from his last PCI and withdraw dual antiplatelet therapy for EGD with Barrett's esophagus.  He and his wife understand asked the gastroenterologist when he can resume 2. Stable no recurrence off amiodarone. 3. Stable continue statin check lipid profile liver function for efficacy and safety 4. Stable blood pressure target continue current treatment   Next appointment: 6 months   Medication Adjustments/Labs and Tests Ordered: Current medicines are reviewed at length with the patient today.  Concerns regarding medicines are outlined above.  No orders of the defined types were placed in this encounter.  No orders of the defined types were placed in this encounter.   Chief Complaint  Patient presents with  . Follow-up  . Coronary Artery Disease  . Hypertension  . Hyperlipidemia  . Atrial Flutter    History of Present Illness:    STARK AGUINAGA is a 77 y.o. male with a hx of CAD, NonSTEMI 04/22/16  with DES to [facility] for unstable angina 10/17/14 , PCI / Resolute Drug Eluting Stent of the proximal Left Anterior Descending Coronary Artery and PTCA of the ostial 1st Diagonal Coronary Artery on 04/23/16 Dyslipidemia, Hypertensive heart disease with heart disease, and atypical atrial flutter last seen 01/22/17.  ASSESSMENT:    01/22/17   1. CAD, multiple vessel    2. Essential hypertension   3. Hyperlipidemia, unspecified hyperlipidemia type   4. Atypical atrial flutter (HCC)    PLAN:    1. Stable continue current treatment myocardial perfusion study prior to surgery 2. Stable BP at target continue current treatment 3. Stable continue statin check liver function and lipid profile on current intermediate intensity statin 4. Stable no clinical recurrence he is off amiodarone  Compliance with diet, lifestyle and medications: yes Past Medical History:  Diagnosis Date  . Anxiety   . Arthritis   . Atypical atrial flutter ([place]) 12/14/2014  . Bradycardia, sinus 10/15/2014  . Burning sensation of feet 01/17/2016  . CAD, multiple vessel 10/18/2014   Overview:  DES to [facility] for unstable angina 10/17/14 Diagnostic Summary Severe stenosis of mid Cx likely cause of recent accelerating angina Diffuse severe stenosis of small caliber LAD unchanged from 2012 Normal LV function Paroxysmal SVT noted during procedure 04/23/16:  PCI / Resolute Drug Eluting Stent of the proximal Left Anterior Descending Coronary Artery. Successful PTCA of the ostial 1st Diagonal Coronary Artery  . 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 Coronary artery disease 2003   s/p MI and numerous stents  . Depression   . Enlarged prostate without lower urinary tract symptoms ([place]) 2012   frequent urination and nocturia; sees dr [name] Late in [place]  . Essential hypertension 10/15/2014  . Gastroesophageal reflux disease without esophagitis 04/21/2016  . GERD (gastroesophageal reflux disease)   . Hyperlipidemia   .  Hypertension   . IHD (ischemic heart disease) 10/15/2014  . Lumbar radiculopathy 02/21/2016  . Myocardial infarction ([place]) 2003  . NSTEMI (non-ST elevated myocardial infarction) ([place]) 04/24/2016   Overview:  LHC 04/24/15: Severe stenosis of the LAD & Diagonal bifurcation  LV ejection fraction is 55-60 %  Interventional Summary Successful Angiosculpt PCI / Resolute Drug  Eluting Stent of the proximal LAD Successful PTCA of the ostial 1st Diagonal Coronary Artery  . Pain in both feet 01/17/2016  . Paroxysmal SVT (supraventricular tachycardia) ([place]) 11/16/2014  . Precordial pain 10/15/2014  . PVC's (premature ventricular contractions) 04/22/2016  . RBBB 10/15/2014  . Sleep disturbances 01/17/2016  . Snoring 01/17/2016  . Subscapularis (muscle) sprain 03/29/2011  . 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 CARDIAC CATHETERIZATION  10-2010   ptca/stent in [place]  . CARDIAC CATHETERIZATION  04/23/2016  . CORONARY ANGIOPLASTY    . EYE SURGERY  05-1010   bil cataract ,iol  . FRACTURE SURGERY    . I&D EXTREMITY Right 02/23/2015   Procedure: RIGHT RING FINGER REVISION AMPUTATION;  Surgeon: Leanora Cover, MD;  Location: [facility];  Service: Orthopedics;  Laterality: Right;  . KNEE ARTHROSCOPY W/ MENISCAL REPAIR  2000   left knee  . LUMBAR EPIDURAL INJECTION  2011   low back pain; DDD  . ring finger tramatic qamputation Right   . SHOULDER ARTHROSCOPY  03/29/2011   Procedure: ARTHROSCOPY SHOULDER;  Surgeon: Augustin Schooling;  Location: [facility];  Service: Orthopedics;  Laterality: Right;  RIGHT SHOULDER ARTHROSCOPY WITH OPEN SUBSCAPULAR REPAIR  . SHOULDER OPEN ROTATOR CUFF REPAIR     3 on rt shoulder; 2 on left  . TRANSFORAMINAL LUMBAR INTERBODY FUSION (TLIF) WITH PEDICLE SCREW FIXATION 1 LEVEL N/A 04/24/2017   Procedure: TRANSFORAMINAL LUMBAR INTERBODY FUSION (TLIF) L4-5;  Surgeon: Melina Schools, MD;  Location: [facility];  Service: Orthopedics;  Laterality: N/A;  4 hrs    Current Medications: Current Meds  Medication Sig  . acetaminophen (TYLENOL) 500 MG tablet Take 1,000 mg by mouth at bedtime.  . clopidogrel (PLAVIX) 75 MG tablet Take 1 tablet (75 mg total) by mouth daily.  Marland Kitchen Co-Enzyme Q-10 30 MG CAPS Take 100 mg by mouth daily.  . Cyanocobalamin (B-12 PO) Take 1 tablet by mouth daily.  Marland Kitchen EPINEPHrine  (EPIPEN 2-PAK) 0.3 mg/0.3 mL IJ SOAJ injection   . esomeprazole (NEXIUM) 40 MG capsule Take 40 mg by mouth daily before breakfast.    . ezetimibe (ZETIA) 10 MG tablet Take 1 tablet (10 mg total) by mouth every evening.  . finasteride (PROSCAR) 5 MG tablet Take 5 mg by mouth daily.  . Flaxseed, Linseed, (FLAXSEED OIL MAX STR) 1300 MG CAPS Take 1,300 mg by mouth daily.  . furosemide (LASIX) 80 MG tablet Take 0.5-1 tablets (40-80 mg total) by mouth daily. Alternate taking  (0.5 tablet) 40mg  one day and (1 tablet) 80mg  another day  . Glucosamine HCl (GLUCOSAMINE PO) Take 1 tablet by mouth daily.  . isosorbide mononitrate (IMDUR) 60 MG 24 hr tablet Take 1 tablet (60 mg total) by mouth daily.  . magnesium oxide (MAG-OX) 400 MG tablet Take 400 mg by mouth daily.  . metoprolol succinate (TOPROL-XL) 25 MG 24 hr tablet Take 25 mg by mouth daily. Take with or immediately following a meal.  . Misc Natural Products (PUMPKIN SEED OIL) CAPS Take 1 capsule by mouth daily.  . Multiple Vitamins-Minerals (MULTIVITAMINS THER. W/MINERALS) TABS  Take 1 tablet by mouth daily.    . nitroGLYCERIN (NITROSTAT) 0.4 MG SL tablet TAKE 1 TABLET UNDER THE TONGUE AS NEEDED AS DIRECTED FOR CHEST PAIN. (Patient taking differently: Place 0.4 mg under the tongue every 5 (five) minutes as needed for chest pain. TAKE 1 TABLET UNDER THE TONGUE AS NEEDED AS DIRECTED FOR CHEST PAIN.)  . Omega-3 Fatty Acids (FISH OIL PO) Take 1 tablet by mouth daily.  . potassium chloride SA (K-DUR,KLOR-CON) 20 MEQ tablet Take 1 tablet (20 mEq total) 2 (two) times daily by mouth.  . simvastatin (ZOCOR) 40 MG tablet Take 1 tablet (40 mg total) at bedtime by mouth.  . tamsulosin (FLOMAX) 0.4 MG CAPS capsule Take 0.4 mg by mouth daily.  . vitamin E 400 UNIT capsule Take 400 Units by mouth daily.     Allergies:   Morphine and related; Buprenorphine hcl; Codeine; Hydrocodone; Hydrocodone-acetaminophen; Morphine; and Percocet [oxycodone-acetaminophen]   Social  History   Socioeconomic History  . Marital status: Married    Spouse name: Not on file  . Number of children: Not on file  . Years of education: Not on file  . Highest education level: Not on file  Occupational History  . Not on file  Social Needs  . Financial resource strain: Not on file  . Food insecurity:    Worry: Not on file    Inability: Not on file  . Transportation needs:    Medical: Not on file    Non-medical: Not on file  Tobacco Use  . Smoking status: Former Smoker    Packs/day: 1.00    Years: 20.00    Pack years: 20.00    Types: Cigarettes    Last attempt to quit: 03/20/1994    Years since quitting: 23.3  . Smokeless tobacco: Former Systems developer    Types: [place] date: 03/20/1996  Substance and Sexual Activity  . Alcohol use: Yes    Alcohol/week: 3.6 oz    Types: 6 Cans of beer per week  . Drug use: No  . Sexual activity: Not on file  Lifestyle  . Physical activity:    Days per week: Not on file    Minutes per session: Not on file  . Stress: Not on file  Relationships  . Social connections:    Talks on phone: Not on file    Gets together: Not on file    Attends religious service: Not on file    Active member of club or organization: Not on file    Attends meetings of clubs or organizations: Not on file    Relationship status: Not on file  Other Topics Concern  . Not on file  Social History Narrative  . Not on file     Family History: The patient's family history includes Cancer in his father and sister; Heart disease in his father and mother. ROS:   Please see the history of present illness.    All other systems reviewed and are negative.  EKGs/Labs/Other Studies Reviewed:    The following studies were reviewed today:  EKG:  EKG ordered today.  The ekg ordered today demonstrates sinus rhythm no ischemic changes  Recent Labs: 01/22/2017: ALT 13 04/18/2017: BUN 18; Creatinine, Ser 1.04; Hemoglobin 12.8; Platelets 197; Potassium 4.4; Sodium 136   Recent Lipid Panel    Component Value Date/Time   CHOL 163 01/22/2017 0910   TRIG 499 (H) 01/22/2017 0910   HDL 42 01/22/2017 0910   CHOLHDL 3.9 01/22/2017  0910   [place] 01/22/2017 0910    Physical Exam:    VS:  BP 118/72 (BP Location: Right Arm, Patient Position: Sitting, Cuff Size: Large)   Pulse (!) 56   Ht 5\' 7"  (1.702 m)   Wt 223 lb 8 oz (101.4 kg)   SpO2 97%   BMI 35.01 kg/m     Wt Readings from Last 3 Encounters:  07/31/17 223 lb 8 oz (101.4 kg)  04/24/17 225 lb (102.1 kg)  04/18/17 222 lb 10.6 oz (101 kg)     GEN:  Well nourished, well developed in no acute distress HEENT: Normal NECK: No JVD; No carotid bruits LYMPHATICS: No lymphadenopathy CARDIAC: RRR, no murmurs, rubs, gallops RESPIRATORY:  Clear to auscultation without rales, wheezing or rhonchi  ABDOMEN: Soft, non-tender, non-distended MUSCULOSKELETAL:  No edema; No deformity  SKIN: Warm and dry NEUROLOGIC:  Alert and oriented x 3 PSYCHIATRIC:  Normal affect    Signed, Shirlee More, MD  07/31/2017 8:25 AM    [place]

## 2017-07-31 NOTE — Patient Instructions (Signed)
Medication Instructions:  Your physician recommends that you continue on your current medications as directed. Please refer to the Current Medication list given to you today.  Labwork: Your physician recommends that you have the following labs drawn: CMP, lipid  Testing/Procedures: You had an EKG today.  Follow-Up: Your physician wants you to follow-up in: 6 months. You will receive a reminder letter in the mail two months in advance. If you don't receive a letter, please call our office to schedule the follow-up appointment.  Any Other Special Instructions Will Be Listed Below (If Applicable).     If you need a refill on your cardiac medications before your next appointment, please call your pharmacy.

## 2017-08-01 ENCOUNTER — Telehealth: Payer: Self-pay

## 2017-08-01 DIAGNOSIS — E782 Mixed hyperlipidemia: Secondary | ICD-10-CM

## 2017-08-01 LAB — COMPREHENSIVE METABOLIC PANEL
A/G RATIO: 1.6 (ref 1.2–2.2)
ALT: 18 IU/L (ref 0–44)
AST: 23 IU/L (ref 0–40)
Albumin: 4.4 g/dL (ref 3.5–4.8)
Alkaline Phosphatase: 72 IU/L (ref 39–117)
BUN/Creatinine Ratio: 19 (ref 10–24)
BUN: 21 mg/dL (ref 8–27)
Bilirubin Total: 0.3 mg/dL (ref 0.0–1.2)
CALCIUM: 9.4 mg/dL (ref 8.6–10.2)
CO2: 26 mmol/L (ref 20–29)
Chloride: 99 mmol/L (ref 96–106)
Creatinine, Ser: 1.13 mg/dL (ref 0.76–1.27)
GFR, EST AFRICAN AMERICAN: 73 mL/min/{1.73_m2} (ref >59)
GFR, EST NON AFRICAN AMERICAN: 63 mL/min/{1.73_m2} (ref >59)
Globulin, Total: 2.7 g/dL (ref 1.5–4.5)
Glucose: 104 mg/dL — ABNORMAL HIGH (ref 65–99)
POTASSIUM: 4.9 mmol/L (ref 3.5–5.2)
Sodium: 140 mmol/L (ref 134–144)
TOTAL PROTEIN: 7.1 g/dL (ref 6.0–8.5)

## 2017-08-01 LAB — LIPID PANEL
CHOL/HDL RATIO: 5.2 ratio — AB (ref 0.0–5.0)
Cholesterol, Total: 208 mg/dL — ABNORMAL HIGH (ref 100–199)
HDL: 40 mg/dL (ref >39)
Triglycerides: 708 mg/dL (ref 0–149)

## 2017-08-01 MED ORDER — GEMFIBROZIL 600 MG PO TABS: 300.0000 mg | ORAL_TABLET | Freq: Two times a day (BID) | ORAL | 11 refills | Status: DC

## 2017-08-01 NOTE — Telephone Encounter (Signed)
Patient informed of results. Advised patient to start Lopid 300 mg twice daily. Patient agreed. Advised patient for repeat lipid panel in 1 month. Patient will go to the [facility] in [place]. Patient verbalized understanding. No further questions.

## 2017-08-01 NOTE — Telephone Encounter (Signed)
-----   Message from Richardo Priest, MD sent at 08/01/2017  6:06 AM EDT ----- TG are severely elevated. Add lipid 300 bid recheck lipids 1 month

## 2017-08-05 DIAGNOSIS — Z981 Arthrodesis status: Secondary | ICD-10-CM | POA: Diagnosis not present

## 2017-08-05 DIAGNOSIS — M533 Sacrococcygeal disorders, not elsewhere classified: Secondary | ICD-10-CM | POA: Diagnosis not present

## 2017-08-05 DIAGNOSIS — M5431 Sciatica, right side: Secondary | ICD-10-CM | POA: Diagnosis not present

## 2017-08-11 DIAGNOSIS — E7800 Pure hypercholesterolemia, unspecified: Secondary | ICD-10-CM | POA: Diagnosis not present

## 2017-08-11 DIAGNOSIS — Z7982 Long term (current) use of aspirin: Secondary | ICD-10-CM | POA: Diagnosis not present

## 2017-08-11 DIAGNOSIS — K210 Gastro-esophageal reflux disease with esophagitis: Secondary | ICD-10-CM | POA: Diagnosis not present

## 2017-08-11 DIAGNOSIS — Z7902 Long term (current) use of antithrombotics/antiplatelets: Secondary | ICD-10-CM | POA: Diagnosis not present

## 2017-08-11 DIAGNOSIS — K208 Other esophagitis: Secondary | ICD-10-CM | POA: Diagnosis not present

## 2017-08-11 DIAGNOSIS — I10 Essential (primary) hypertension: Secondary | ICD-10-CM | POA: Diagnosis not present

## 2017-08-11 DIAGNOSIS — M1990 Unspecified osteoarthritis, unspecified site: Secondary | ICD-10-CM | POA: Diagnosis not present

## 2017-08-11 DIAGNOSIS — I252 Old myocardial infarction: Secondary | ICD-10-CM | POA: Diagnosis not present

## 2017-08-11 DIAGNOSIS — Z79899 Other long term (current) drug therapy: Secondary | ICD-10-CM | POA: Diagnosis not present

## 2017-08-11 DIAGNOSIS — K219 Gastro-esophageal reflux disease without esophagitis: Secondary | ICD-10-CM | POA: Diagnosis not present

## 2017-08-11 DIAGNOSIS — K2270 Barrett's esophagus without dysplasia: Secondary | ICD-10-CM | POA: Diagnosis not present

## 2017-08-11 DIAGNOSIS — Z955 Presence of coronary angioplasty implant and graft: Secondary | ICD-10-CM | POA: Diagnosis not present

## 2017-08-11 DIAGNOSIS — K449 Diaphragmatic hernia without obstruction or gangrene: Secondary | ICD-10-CM | POA: Diagnosis not present

## 2017-08-13 ENCOUNTER — Telehealth: Payer: Self-pay | Admitting: Cardiology

## 2017-08-13 MED ORDER — GEMFIBROZIL 600 MG PO TABS: 300.0000 mg | ORAL_TABLET | Freq: Two times a day (BID) | ORAL | 3 refills | Status: DC

## 2017-08-13 NOTE — Telephone Encounter (Signed)
Patient said the last medication sent for refill was only for 30 days and he needs 90 day refill. He could not remember what medication it was.

## 2017-08-13 NOTE — Telephone Encounter (Signed)
Patient called requesting that gemfibrozil 600mg  one half (300mg ) tablet twice daily be sent to [facility] in [name].  Patient advised Rx has been sent to pharmacy.  Patient verbalized understanding.

## 2017-08-21 DIAGNOSIS — N318 Other neuromuscular dysfunction of bladder: Secondary | ICD-10-CM | POA: Diagnosis not present

## 2017-08-21 DIAGNOSIS — N3020 Other chronic cystitis without hematuria: Secondary | ICD-10-CM | POA: Diagnosis not present

## 2017-08-21 DIAGNOSIS — N401 Enlarged prostate with lower urinary tract symptoms: Secondary | ICD-10-CM | POA: Diagnosis not present

## 2017-08-21 DIAGNOSIS — R351 Nocturia: Secondary | ICD-10-CM | POA: Diagnosis not present

## 2017-09-01 ENCOUNTER — Other Ambulatory Visit: Payer: Self-pay

## 2017-09-01 DIAGNOSIS — E782 Mixed hyperlipidemia: Secondary | ICD-10-CM | POA: Diagnosis not present

## 2017-09-01 MED ORDER — GEMFIBROZIL 600 MG PO TABS: 300.0000 mg | ORAL_TABLET | Freq: Two times a day (BID) | ORAL | 3 refills | Status: DC

## 2017-09-02 LAB — LIPID PANEL
Chol/HDL Ratio: 3.9 ratio (ref 0.0–5.0)
Cholesterol, Total: 180 mg/dL (ref 100–199)
HDL: 46 mg/dL (ref >39)
LDL Calculated: 58 mg/dL (ref 0–99)
TRIGLYCERIDES: 382 mg/dL — AB (ref 0–149)
VLDL Cholesterol Cal: 76 mg/dL — ABNORMAL HIGH (ref 5–40)

## 2017-09-16 DIAGNOSIS — M5136 Other intervertebral disc degeneration, lumbar region: Secondary | ICD-10-CM | POA: Diagnosis not present

## 2017-09-17 ENCOUNTER — Other Ambulatory Visit: Payer: Self-pay | Admitting: Orthopedic Surgery

## 2017-09-17 DIAGNOSIS — M5136 Other intervertebral disc degeneration, lumbar region: Secondary | ICD-10-CM

## 2017-09-18 DIAGNOSIS — H5203 Hypermetropia, bilateral: Secondary | ICD-10-CM | POA: Diagnosis not present

## 2017-09-18 DIAGNOSIS — H52223 Regular astigmatism, bilateral: Secondary | ICD-10-CM | POA: Diagnosis not present

## 2017-09-18 DIAGNOSIS — H01001 Unspecified blepharitis right upper eyelid: Secondary | ICD-10-CM | POA: Diagnosis not present

## 2017-09-26 ENCOUNTER — Ambulatory Visit
Admission: RE | Admit: 2017-09-26 | Discharge: 2017-09-26 | Disposition: A | Discharge status: Home / Self Care | Payer: Self-pay | Source: Ambulatory Visit | Attending: Orthopedic Surgery | Admitting: Orthopedic Surgery

## 2017-09-26 ENCOUNTER — Other Ambulatory Visit: Payer: Self-pay | Admitting: Orthopedic Surgery

## 2017-09-26 DIAGNOSIS — M5136 Other intervertebral disc degeneration, lumbar region: Secondary | ICD-10-CM

## 2017-09-29 ENCOUNTER — Ambulatory Visit
Admission: RE | Admit: 2017-09-29 | Discharge: 2017-09-29 | Disposition: A | Discharge status: Home / Self Care | Payer: Medicare Other | Source: Ambulatory Visit | Attending: Orthopedic Surgery | Admitting: Orthopedic Surgery

## 2017-09-29 VITALS — BP 149/76 | HR 50

## 2017-09-29 DIAGNOSIS — M5136 Other intervertebral disc degeneration, lumbar region: Secondary | ICD-10-CM

## 2017-09-29 DIAGNOSIS — M5126 Other intervertebral disc displacement, lumbar region: Secondary | ICD-10-CM | POA: Diagnosis not present

## 2017-09-29 DIAGNOSIS — Z981 Arthrodesis status: Secondary | ICD-10-CM

## 2017-09-29 MED ORDER — IOPAMIDOL (ISOVUE-M 200) INJECTION 41%
15.0000 mL | Freq: Once | INTRAMUSCULAR | Status: AC
  Administered 2017-09-29: 15 mL via INTRATHECAL

## 2017-09-29 MED ORDER — DIAZEPAM 5 MG PO TABS: 5.0000 mg | ORAL_TABLET | Freq: Once | ORAL | Status: DC

## 2017-09-29 NOTE — Progress Notes (Signed)
Patient states he has been off Plavix for at least the past five days. 

## 2017-09-29 NOTE — Discharge Instructions (Signed)

## 2017-10-20 DIAGNOSIS — M25561 Pain in right knee: Secondary | ICD-10-CM

## 2017-10-20 HISTORY — DX: Pain in right knee: M25.561

## 2017-10-21 DIAGNOSIS — M4310 Spondylolisthesis, site unspecified: Secondary | ICD-10-CM | POA: Diagnosis not present

## 2017-10-21 DIAGNOSIS — M5136 Other intervertebral disc degeneration, lumbar region: Secondary | ICD-10-CM | POA: Diagnosis not present

## 2017-11-06 DIAGNOSIS — M25561 Pain in right knee: Secondary | ICD-10-CM | POA: Diagnosis not present

## 2017-11-26 DIAGNOSIS — M25552 Pain in left hip: Secondary | ICD-10-CM

## 2017-11-26 DIAGNOSIS — M25561 Pain in right knee: Secondary | ICD-10-CM | POA: Diagnosis not present

## 2017-11-26 HISTORY — DX: Pain in left hip: M25.552

## 2017-12-17 DIAGNOSIS — M25561 Pain in right knee: Secondary | ICD-10-CM | POA: Diagnosis not present

## 2017-12-17 DIAGNOSIS — M79604 Pain in right leg: Secondary | ICD-10-CM

## 2017-12-17 HISTORY — DX: Pain in right leg: M79.604

## 2018-01-15 ENCOUNTER — Ambulatory Visit (INDEPENDENT_AMBULATORY_CARE_PROVIDER_SITE_OTHER): Payer: Medicare Other | Admitting: Diagnostic Neuroimaging

## 2018-01-15 ENCOUNTER — Encounter (INDEPENDENT_AMBULATORY_CARE_PROVIDER_SITE_OTHER): Payer: Medicare Other | Admitting: Diagnostic Neuroimaging

## 2018-01-15 DIAGNOSIS — R29898 Other symptoms and signs involving the musculoskeletal system: Secondary | ICD-10-CM | POA: Diagnosis not present

## 2018-01-15 DIAGNOSIS — Z0289 Encounter for other administrative examinations: Secondary | ICD-10-CM

## 2018-01-19 NOTE — Procedures (Signed)
[facility]  NCS (NERVE CONDUCTION STUDY) WITH EMG (ELECTROMYOGRAPHY) REPORT   STUDY DATE: 01/15/18 PATIENT NAME: Andrew Hardy. DOB: 20-Mar-1941 MRN: [id number]  ORDERING CLINICIAN: Sandi Carne, MD  TECHNOLOGIST: Oneita Jolly  ELECTROMYOGRAPHER: Earlean Polka. Bienvenido Proehl, MD  CLINICAL INFORMATION: 77 year old male with right leg compression injury behind the knee, with post-traumatic right foot drop.  FINDINGS: NERVE CONDUCTION STUDY: Right peroneal motor responses normal distal latency, decreased amplitude, slow conduction velocity (22 m/s with stimulation at the popliteal fossa, 34 m/s with stimulation below the fibular head).  Left peroneal motor responses normal distal latency, amplitude, slightly slow conduction velocity with stimulation below the head.  Bilateral tibial motor responses have normal distal latency, normal amplitudes, slightly slow conduction velocities.  Bilateral tibial F wave latencies are slightly prolonged.  Right sural and right superficial peroneal sensory responses have prolonged peak latency and decreased amplitude.  Left superficial peroneal sensory response has normal peak latency and decreased amplitude.  Left sural sensory responses normal.   NEEDLE ELECTROMYOGRAPHY:  Needle examination of right lower extremity shows positive sharp waves and fibrillation potentials in right tibialis anterior and right peroneus longus muscles, with decreased motor unit recruitment on exertion.  Other muscles of right lower extreme knee are unremarkable.   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 - Mild underlying bilateral axonal sensorimotor polyneuropathy.    INTERPRETING PHYSICIAN:  Penni Bombard, MD Certified in Neurology, Neurophysiology and Neuroimaging  [facility] [address], [place], [place]  [address] [phone]   [facility]    Nerve / Sites Muscle Latency Ref. Amplitude Ref. Rel Amp Segments Distance Velocity Ref. Area    ms ms mV mV %  cm m/s m/s mVms  R Peroneal - EDB     Ankle EDB 6.4 ?6.5 0.8 ?2.0 100 Ankle - EDB 9   2.1     Fib head EDB 14.9  0.6  67.3 Fib head - Ankle 29 34 ?44 1.4     Pop fossa EDB 19.5  0.6  98.1 Pop fossa - Fib head 10 22 ?44 1.8         Pop fossa - Ankle      L Peroneal - EDB     Ankle EDB 5.5 ?6.5 2.8 ?2.0 100 Ankle - EDB 9   10.0     Fib head EDB 13.5  2.9  102 Fib head - Ankle 29 36 ?44 9.7     Pop fossa EDB 15.8  2.8  96.9 Pop fossa - Fib head 10 44 ?44 9.5         Pop fossa - Ankle      R Tibial - AH     Ankle AH 5.8 ?5.8 5.9 ?4.0 100 Ankle - AH 9   13.7     Pop fossa AH 16.6  3.4  57.1 Pop fossa - Ankle 37 34 ?41 11.7  L Tibial - AH     Ankle AH 5.4 ?5.8 5.0 ?4.0 100 Ankle - AH 9   17.2     Pop fossa AH 15.4  3.0  60.6 Pop fossa - Ankle 37 37 ?41 17.7             SNC    Nerve / Sites Rec. Site Peak Lat Ref.  Amp Ref. Segments Distance    ms ms V V  cm  R Sural -  Ankle (Calf)     Calf Ankle 4.6 ?4.4 5 ?6 Calf - Ankle 14  L Sural - Ankle (Calf)     Calf Ankle 4.2 ?4.4 9 ?6 Calf - Ankle 14  R Superficial peroneal - Ankle     Lat leg Ankle 4.5 ?4.4 5 ?6 Lat leg - Ankle 14  L Superficial peroneal - Ankle     Lat leg Ankle 4.3 ?4.4 5 ?6 Lat leg - Ankle 14              F  Wave    Nerve F Lat Ref.   ms ms  R Tibial - AH 58.9 ?56.0  L Tibial - AH 56.6 ?56.0         EMG full       EMG Summary Table    Spontaneous MUAP Recruitment  Muscle IA Fib PSW Fasc Other Amp Dur. Poly Pattern  R. Vastus medialis Normal None None None _______ Normal Normal Normal Normal  R. Tibialis anterior Normal 2+ 2+ None _______ Increased Normal Normal Reduced  R. Tibialis posterior Normal None None None _______ Normal Normal Normal Normal  R. Gastrocnemius (Medial head) Normal None None None _______ Normal Normal Normal Normal  R. Peroneus longus Normal  1+ 1+ None _______ Increased Normal Normal Reduced  R. Biceps femoris (short head) Normal None None None _______ Normal Normal Normal Normal

## 2018-01-27 DIAGNOSIS — Z23 Encounter for immunization: Secondary | ICD-10-CM | POA: Diagnosis not present

## 2018-02-02 ENCOUNTER — Other Ambulatory Visit: Payer: Self-pay

## 2018-02-02 MED ORDER — EZETIMIBE 10 MG PO TABS: 10.0000 mg | ORAL_TABLET | Freq: Every evening | ORAL | 0 refills | Status: DC

## 2018-02-02 MED ORDER — ISOSORBIDE MONONITRATE ER 60 MG PO TB24: 60.0000 mg | ORAL_TABLET | Freq: Every day | ORAL | 0 refills | Status: DC

## 2018-02-17 ENCOUNTER — Ambulatory Visit: Payer: Medicare Other | Admitting: Cardiology

## 2018-03-05 ENCOUNTER — Other Ambulatory Visit: Payer: Self-pay | Admitting: *Deleted

## 2018-03-05 MED ORDER — ISOSORBIDE MONONITRATE ER 60 MG PO TB24: 60.0000 mg | ORAL_TABLET | Freq: Every day | ORAL | 0 refills | Status: DC

## 2018-03-05 MED ORDER — METOPROLOL SUCCINATE ER 25 MG PO TB24: 25.0000 mg | ORAL_TABLET | Freq: Every day | ORAL | 0 refills | Status: DC

## 2018-03-05 MED ORDER — SIMVASTATIN 40 MG PO TABS: 40.0000 mg | ORAL_TABLET | Freq: Every day | ORAL | 0 refills | Status: DC

## 2018-03-05 MED ORDER — EZETIMIBE 10 MG PO TABS: 10.0000 mg | ORAL_TABLET | Freq: Every evening | ORAL | 0 refills | Status: DC

## 2018-03-05 MED ORDER — POTASSIUM CHLORIDE CRYS ER 20 MEQ PO TBCR: 20.0000 meq | EXTENDED_RELEASE_TABLET | Freq: Two times a day (BID) | ORAL | 0 refills | Status: DC

## 2018-03-05 NOTE — Telephone Encounter (Signed)
Refills sent to get patient to overdue follow up appointment scheduled on 04/08/18 with Dr. Bettina Gavia.

## 2018-03-09 ENCOUNTER — Other Ambulatory Visit: Payer: Self-pay

## 2018-03-09 MED ORDER — METOPROLOL SUCCINATE ER 25 MG PO TB24: 25.0000 mg | ORAL_TABLET | Freq: Every day | ORAL | 0 refills | Status: DC

## 2018-03-11 DIAGNOSIS — I8310 Varicose veins of unspecified lower extremity with inflammation: Secondary | ICD-10-CM | POA: Diagnosis not present

## 2018-03-11 DIAGNOSIS — D485 Neoplasm of uncertain behavior of skin: Secondary | ICD-10-CM | POA: Diagnosis not present

## 2018-03-11 DIAGNOSIS — R600 Localized edema: Secondary | ICD-10-CM | POA: Diagnosis not present

## 2018-03-11 DIAGNOSIS — L308 Other specified dermatitis: Secondary | ICD-10-CM | POA: Diagnosis not present

## 2018-04-06 NOTE — Progress Notes (Signed)
Cardiology Office Note:    Date:  04/08/2018   ID:  Andrew Hardy., DOB October 19, 1940, MRN [id number]  PCP:  Lillard Anes, MD  Cardiologist:  Shirlee More, MD    Referring MD: Lillard Anes,*    ASSESSMENT:    1. Coronary artery disease involving native coronary artery of native heart with angina pectoris ([place])   2. Hypertensive heart disease with heart failure ([place])   3. Atypical atrial flutter ([place])   4. Hyperlipidemia, unspecified hyperlipidemia type    PLAN:    In order of problems listed above:  1. CAD stable [place] Heart Association class I having no anginal discomfort at this time we will continue current medical treatment including aspirin clopidogrel beta-blocker oral nitrates and lipid-lowering therapy.  I do not feel he requires an ischemia evaluation 2. Stable blood pressure at target he has no edema heart failure is compensated class I continue his current antihypertensives diuretic 3. Stable and he continues to maintain sinus rhythm off amiodarone. 4. Recheck lipid profile he had severely elevated triglycerides and takes combined therapy with Zetia statin fish oil.  Along with gemfibrozil   Next appointment: 6 months   Medication Adjustments/Labs and Tests Ordered: Current medicines are reviewed at length with the patient today.  Concerns regarding medicines are outlined above.  Orders Placed This Encounter  Procedures  . Comp Met (CMET)  . Pro b natriuretic peptide (BNP)  . Lipid Profile   Meds ordered this encounter  Medications  . clopidogrel (PLAVIX) 75 MG tablet    Sig: Take 1 tablet (75 mg total) by mouth daily.    Dispense:  90 tablet    Refill:  2  . ezetimibe (ZETIA) 10 MG tablet    Sig: Take 1 tablet (10 mg total) by mouth every evening.    Dispense:  90 tablet    Refill:  2    Patient is due for a f/u  . furosemide (LASIX) 80 MG tablet    Sig: Take 1 tablet (80 mg total) by mouth daily. Alternate taking  (0.5 tablet) 85m  one day and (1 tablet) 849manother day    Dispense:  90 tablet    Refill:  2  . isosorbide mononitrate (IMDUR) 60 MG 24 hr tablet    Sig: Take 1 tablet (60 mg total) by mouth daily.    Dispense:  90 tablet    Refill:  2    Patient is due for a f/u  . metoprolol succinate (TOPROL-XL) 25 MG 24 hr tablet    Sig: Take 1 tablet (25 mg total) by mouth daily. Take with or immediately following a meal.    Dispense:  90 tablet    Refill:  2    Patient will receive more refills once he f/u in December  . simvastatin (ZOCOR) 40 MG tablet    Sig: Take 1 tablet (40 mg total) by mouth at bedtime.    Dispense:  90 tablet    Refill:  2  . nitroGLYCERIN (NITROSTAT) 0.4 MG SL tablet    Sig: Place 1 tablet (0.4 mg total) under the tongue every 5 (five) minutes as needed for chest pain. TAKE 1 TABLET UNDER THE TONGUE AS NEEDED AS DIRECTED FOR CHEST PAIN.    Dispense:  25 tablet    Refill:  11  . potassium chloride SA (K-DUR,KLOR-CON) 20 MEQ tablet    Sig: Take 1 tablet (20 mEq total) by mouth 2 (two) times daily.  Dispense:  180 tablet    Refill:  2    Chief Complaint  Patient presents with  . Follow-up  . Coronary Artery Disease    History of Present Illness:    Andrew Chevalier. is a 77 y.o. male with a hx of CAD, NonSTEMI 04/22/16  with DES to [facility] for unstable angina 10/17/14 , PCI / Resolute Drug Eluting Stent of the proximal Left Anterior Descending Coronary Artery and PTCA of the ostial 1st Diagonal Coronary Artery on 04/23/16 Dyslipidemia, Hypertensive heart disease with heart disease, and atypical atrial flutter  last seen 07/31/17. Compliance with diet, lifestyle and medications: Yes  Remains active farms full-time no exercise intolerance edema shortness of breath chest pain palpitation or syncope.  He is on multiple lipid-lowering medications for safety and efficacy check liver function lipid profile triglycerides today.  He does have stasis changes both in his legs as well as his arms  contributed to by dual antiplatelet therapy. Past Medical History:  Diagnosis Date  . Anxiety   . Arthritis   . Atypical atrial flutter ([place]) 12/14/2014  . Bradycardia, sinus 10/15/2014  . Burning sensation of feet 01/17/2016  . CAD, multiple vessel 10/18/2014   Overview:  DES to [facility] for unstable angina 10/17/14 Diagnostic Summary Severe stenosis of mid Cx likely cause of recent accelerating angina Diffuse severe stenosis of small caliber LAD unchanged from 2012 Normal LV function Paroxysmal SVT noted during procedure 04/23/16:  PCI / Resolute Drug Eluting Stent of the proximal Left Anterior Descending Coronary Artery. Successful PTCA of the ostial 1st Diagonal Coronary Artery  . 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 Coronary artery disease 2003   s/p MI and numerous stents  . Depression   . Enlarged prostate without lower urinary tract symptoms ([place]) 2012   frequent urination and nocturia; sees dr [name] Late in [place]  . Essential hypertension 10/15/2014  . Gastroesophageal reflux disease without esophagitis 04/21/2016  . GERD (gastroesophageal reflux disease)   . Hyperlipidemia   . Hypertension   . IHD (ischemic heart disease) 10/15/2014  . Lumbar radiculopathy 02/21/2016  . Myocardial infarction ([place]) 2003  . NSTEMI (non-ST elevated myocardial infarction) ([place]) 04/24/2016   Overview:  LHC 04/24/15: Severe stenosis of the LAD & Diagonal bifurcation  LV ejection fraction is 55-60 %  Interventional Summary Successful Angiosculpt PCI / Resolute Drug Eluting Stent of the proximal LAD Successful PTCA of the ostial 1st Diagonal Coronary Artery  . Pain in both feet 01/17/2016  . Paroxysmal SVT (supraventricular tachycardia) ([place]) 11/16/2014  . Precordial pain 10/15/2014  . PVC's (premature ventricular contractions) 04/22/2016  . RBBB 10/15/2014  . Sleep disturbances 01/17/2016  . Snoring 01/17/2016  . Subscapularis (muscle) sprain 03/29/2011  . Traumatic amputation of right ring finger  03/20/2015    Past Surgical History:  Procedure Laterality Date  . CARDIAC CATHETERIZATION  10-2010   ptca/stent in [place]  . CARDIAC CATHETERIZATION  04/23/2016  . CORONARY ANGIOPLASTY    . EYE SURGERY  05-1010   bil cataract ,iol  . FRACTURE SURGERY    . I&D EXTREMITY Right 02/23/2015   Procedure: RIGHT RING FINGER REVISION AMPUTATION;  Surgeon: Leanora Cover, MD;  Location: [facility];  Service: Orthopedics;  Laterality: Right;  . KNEE ARTHROSCOPY W/ MENISCAL REPAIR  2000   left knee  . LUMBAR EPIDURAL INJECTION  2011   low back pain; DDD  . ring finger tramatic qamputation Right   . SHOULDER ARTHROSCOPY  03/29/2011  Procedure: ARTHROSCOPY SHOULDER;  Surgeon: Augustin Schooling;  Location: [facility];  Service: Orthopedics;  Laterality: Right;  RIGHT SHOULDER ARTHROSCOPY WITH OPEN SUBSCAPULAR REPAIR  . SHOULDER OPEN ROTATOR CUFF REPAIR     3 on rt shoulder; 2 on left  . TRANSFORAMINAL LUMBAR INTERBODY FUSION (TLIF) WITH PEDICLE SCREW FIXATION 1 LEVEL N/A 04/24/2017   Procedure: TRANSFORAMINAL LUMBAR INTERBODY FUSION (TLIF) L4-5;  Surgeon: Melina Schools, MD;  Location: [facility];  Service: Orthopedics;  Laterality: N/A;  4 hrs    Current Medications: Current Meds  Medication Sig  . acetaminophen (TYLENOL) 500 MG tablet Take 1,000 mg by mouth at bedtime as needed.   Marland Kitchen aspirin 81 MG chewable tablet Chew by mouth daily.  . clopidogrel (PLAVIX) 75 MG tablet Take 1 tablet (75 mg total) by mouth daily.  Marland Kitchen Co-Enzyme Q-10 30 MG CAPS Take 100 mg by mouth daily.  . Cyanocobalamin (B-12 PO) Take 1 tablet by mouth daily.  Marland Kitchen EPINEPHrine (EPIPEN 2-PAK) 0.3 mg/0.3 mL IJ SOAJ injection   . esomeprazole (NEXIUM) 40 MG capsule Take 40 mg by mouth daily before breakfast.    . ezetimibe (ZETIA) 10 MG tablet Take 1 tablet (10 mg total) by mouth every evening.  . finasteride (PROSCAR) 5 MG tablet Take 5 mg by mouth daily.  . Flaxseed, Linseed, (FLAXSEED OIL MAX STR) 1300 MG CAPS Take 1,300 mg by mouth daily.  .  furosemide (LASIX) 80 MG tablet Take 1 tablet (80 mg total) by mouth daily. Alternate taking  (0.5 tablet) 51m one day and (1 tablet) 814manother day  . gemfibrozil (LOPID) 600 MG tablet Take 0.5 tablets (300 mg total) by mouth 2 (two) times daily before a meal.  . Glucosamine HCl (GLUCOSAMINE PO) Take 1 tablet by mouth daily.  . isosorbide mononitrate (IMDUR) 60 MG 24 hr tablet Take 1 tablet (60 mg total) by mouth daily.  . magnesium oxide (MAG-OX) 400 MG tablet Take 400 mg by mouth daily.  . metoprolol succinate (TOPROL-XL) 25 MG 24 hr tablet Take 1 tablet (25 mg total) by mouth daily. Take with or immediately following a meal.  . minocycline (MINOCIN,DYNACIN) 50 MG capsule TAKE 1 CAPSULE BY MOUTH QD  . Misc Natural Products (PUMPKIN SEED OIL) CAPS Take 1 capsule by mouth daily.  . Multiple Vitamins-Minerals (MULTIVITAMINS THER. W/MINERALS) TABS Take 1 tablet by mouth daily.    . nitroGLYCERIN (NITROSTAT) 0.4 MG SL tablet Place 1 tablet (0.4 mg total) under the tongue every 5 (five) minutes as needed for chest pain. TAKE 1 TABLET UNDER THE TONGUE AS NEEDED AS DIRECTED FOR CHEST PAIN.  . Marland Kitchenmega-3 Fatty Acids (FISH OIL PO) Take 1 tablet by mouth daily.  . potassium chloride SA (K-DUR,KLOR-CON) 20 MEQ tablet Take 1 tablet (20 mEq total) by mouth 2 (two) times daily.  . simvastatin (ZOCOR) 40 MG tablet Take 1 tablet (40 mg total) by mouth at bedtime.  . tamsulosin (FLOMAX) 0.4 MG CAPS capsule Take 0.4 mg by mouth daily.  . vitamin E 400 UNIT capsule Take 400 Units by mouth daily.  . [DISCONTINUED] clopidogrel (PLAVIX) 75 MG tablet Take 1 tablet (75 mg total) by mouth daily.  . [DISCONTINUED] ezetimibe (ZETIA) 10 MG tablet Take 1 tablet (10 mg total) by mouth every evening.  . [DISCONTINUED] furosemide (LASIX) 80 MG tablet Take 0.5-1 tablets (40-80 mg total) by mouth daily. Alternate taking  (0.5 tablet) 4056mne day and (1 tablet) 2m71mother day  . [DISCONTINUED] isosorbide mononitrate (IMDUR) 60  MG 24 hr tablet Take 1 tablet (60 mg total) by mouth daily.  . [DISCONTINUED] metoprolol succinate (TOPROL-XL) 25 MG 24 hr tablet Take 1 tablet (25 mg total) by mouth daily. Take with or immediately following a meal.  . [DISCONTINUED] nitroGLYCERIN (NITROSTAT) 0.4 MG SL tablet TAKE 1 TABLET UNDER THE TONGUE AS NEEDED AS DIRECTED FOR CHEST PAIN. (Patient taking differently: Place 0.4 mg under the tongue every 5 (five) minutes as needed for chest pain. TAKE 1 TABLET UNDER THE TONGUE AS NEEDED AS DIRECTED FOR CHEST PAIN.)  . [DISCONTINUED] potassium chloride SA (K-DUR,KLOR-CON) 20 MEQ tablet Take 1 tablet (20 mEq total) by mouth 2 (two) times daily.  . [DISCONTINUED] simvastatin (ZOCOR) 40 MG tablet Take 1 tablet (40 mg total) by mouth at bedtime.     Allergies:   Buprenorphine hcl; Codeine; Hydrocodone; and Percocet [oxycodone-acetaminophen]   Social History   Socioeconomic History  . Marital status: Married    Spouse name: Not on file  . Number of children: Not on file  . Years of education: Not on file  . Highest education level: Not on file  Occupational History  . Not on file  Social Needs  . Financial resource strain: Not on file  . Food insecurity:    Worry: Not on file    Inability: Not on file  . Transportation needs:    Medical: Not on file    Non-medical: Not on file  Tobacco Use  . Smoking status: Former Smoker    Packs/day: 1.00    Years: 20.00    Pack years: 20.00    Types: Cigarettes    Last attempt to quit: 03/20/1994    Years since quitting: 24.0  . Smokeless tobacco: Former Systems developer    Types: [place] date: 03/20/1996  Substance and Sexual Activity  . Alcohol use: Yes    Alcohol/week: 6.0 standard drinks    Types: 6 Cans of beer per week  . Drug use: No  . Sexual activity: Not on file  Lifestyle  . Physical activity:    Days per week: Not on file    Minutes per session: Not on file  . Stress: Not on file  Relationships  . Social connections:    Talks  on phone: Not on file    Gets together: Not on file    Attends religious service: Not on file    Active member of club or organization: Not on file    Attends meetings of clubs or organizations: Not on file    Relationship status: Not on file  Other Topics Concern  . Not on file  Social History Narrative  . Not on file     Family History: The patient's family history includes Cancer in his father and sister; Heart disease in his father and mother. ROS:   Please see the history of present illness.    All other systems reviewed and are negative.  EKGs/Labs/Other Studies Reviewed:    The following studies were reviewed today  Recent Labs: 04/18/2017: Hemoglobin 12.8; Platelets 197 07/31/2017: ALT 18; BUN 21; Creatinine, Ser 1.13; Potassium 4.9; Sodium 140  Recent Lipid Panel    Component Value Date/Time   CHOL 180 09/01/2017 0816   TRIG 382 (H) 09/01/2017 0816   HDL 46 09/01/2017 0816   CHOLHDL 3.9 09/01/2017 0816   LDLCALC 58 09/01/2017 0816    Physical Exam:    VS:  BP 130/72 (BP Location: Right Arm, Patient Position: Sitting, Cuff Size:  Large)   Pulse (!) 54   Ht 5' 7" (1.702 m)   Wt 225 lb 2 oz (102.1 kg)   SpO2 97%   BMI 35.26 kg/m     Wt Readings from Last 3 Encounters:  04/08/18 225 lb 2 oz (102.1 kg)  07/31/17 223 lb 8 oz (101.4 kg)  04/24/17 225 lb (102.1 kg)     GEN:  Well nourished, well developed in no acute distress HEENT: Normal NECK: No JVD; No carotid bruits LYMPHATICS: No lymphadenopathy CARDIAC: RRR, no murmurs, rubs, gallops RESPIRATORY:  Clear to auscultation without rales, wheezing or rhonchi  ABDOMEN: Soft, non-tender, non-distended MUSCULOSKELETAL:  No edema; No deformity  SKIN: Warm and dry NEUROLOGIC:  Alert and oriented x 3 PSYCHIATRIC:  Normal affect    Signed, Shirlee More, MD  04/08/2018 11:29 AM    [facility]

## 2018-04-08 ENCOUNTER — Ambulatory Visit (INDEPENDENT_AMBULATORY_CARE_PROVIDER_SITE_OTHER): Payer: Medicare Other | Admitting: Cardiology

## 2018-04-08 ENCOUNTER — Encounter: Payer: Self-pay | Admitting: Cardiology

## 2018-04-08 VITALS — BP 130/72 | HR 54 | Ht 67.0 in | Wt 225.1 lb

## 2018-04-08 DIAGNOSIS — I25119 Atherosclerotic heart disease of native coronary artery with unspecified angina pectoris: Secondary | ICD-10-CM | POA: Diagnosis not present

## 2018-04-08 DIAGNOSIS — I484 Atypical atrial flutter: Secondary | ICD-10-CM

## 2018-04-08 DIAGNOSIS — E785 Hyperlipidemia, unspecified: Secondary | ICD-10-CM | POA: Diagnosis not present

## 2018-04-08 DIAGNOSIS — I110 Hypertensive heart disease with heart failure: Secondary | ICD-10-CM

## 2018-04-08 LAB — COMPREHENSIVE METABOLIC PANEL
ALT: 14 IU/L (ref 0–44)
AST: 18 IU/L (ref 0–40)
Albumin/Globulin Ratio: 1.7 (ref 1.2–2.2)
Albumin: 4.5 g/dL (ref 3.5–4.8)
Alkaline Phosphatase: 67 IU/L (ref 39–117)
BUN/Creatinine Ratio: 15 (ref 10–24)
BUN: 17 mg/dL (ref 8–27)
Bilirubin Total: 0.5 mg/dL (ref 0.0–1.2)
CHLORIDE: 102 mmol/L (ref 96–106)
CO2: 24 mmol/L (ref 20–29)
Calcium: 9.2 mg/dL (ref 8.6–10.2)
Creatinine, Ser: 1.16 mg/dL (ref 0.76–1.27)
GFR, EST AFRICAN AMERICAN: 71 mL/min/{1.73_m2} (ref >59)
GFR, EST NON AFRICAN AMERICAN: 61 mL/min/{1.73_m2} (ref >59)
GLUCOSE: 119 mg/dL — AB (ref 65–99)
Globulin, Total: 2.6 g/dL (ref 1.5–4.5)
Potassium: 4.4 mmol/L (ref 3.5–5.2)
Sodium: 140 mmol/L (ref 134–144)
TOTAL PROTEIN: 7.1 g/dL (ref 6.0–8.5)

## 2018-04-08 LAB — LIPID PANEL
CHOL/HDL RATIO: 3.2 ratio (ref 0.0–5.0)
Cholesterol, Total: 152 mg/dL (ref 100–199)
HDL: 48 mg/dL (ref >39)
LDL CALC: 64 mg/dL (ref 0–99)
TRIGLYCERIDES: 198 mg/dL — AB (ref 0–149)
VLDL Cholesterol Cal: 40 mg/dL (ref 5–40)

## 2018-04-08 LAB — PRO B NATRIURETIC PEPTIDE: NT-Pro BNP: 78 pg/mL (ref 0–486)

## 2018-04-08 MED ORDER — ISOSORBIDE MONONITRATE ER 60 MG PO TB24: 60.0000 mg | ORAL_TABLET | Freq: Every day | ORAL | 2 refills | Status: DC

## 2018-04-08 MED ORDER — EZETIMIBE 10 MG PO TABS: 10.0000 mg | ORAL_TABLET | Freq: Every evening | ORAL | 2 refills | Status: DC

## 2018-04-08 MED ORDER — CLOPIDOGREL BISULFATE 75 MG PO TABS: 75.0000 mg | ORAL_TABLET | Freq: Every day | ORAL | 2 refills | Status: DC

## 2018-04-08 MED ORDER — FUROSEMIDE 80 MG PO TABS: 80.0000 mg | ORAL_TABLET | Freq: Every day | ORAL | 2 refills | Status: DC

## 2018-04-08 MED ORDER — NITROGLYCERIN 0.4 MG SL SUBL: 0.4000 mg | SUBLINGUAL_TABLET | SUBLINGUAL | 11 refills | Status: DC | PRN

## 2018-04-08 MED ORDER — SIMVASTATIN 40 MG PO TABS: 40.0000 mg | ORAL_TABLET | Freq: Every day | ORAL | 2 refills | Status: DC

## 2018-04-08 MED ORDER — POTASSIUM CHLORIDE CRYS ER 20 MEQ PO TBCR: 20.0000 meq | EXTENDED_RELEASE_TABLET | Freq: Two times a day (BID) | ORAL | 2 refills | Status: DC

## 2018-04-08 MED ORDER — METOPROLOL SUCCINATE ER 25 MG PO TB24: 25.0000 mg | ORAL_TABLET | Freq: Every day | ORAL | 2 refills | Status: DC

## 2018-04-08 NOTE — Patient Instructions (Signed)
Medication Instructions:  Your physician recommends that you continue on your current medications as directed. Please refer to the Current Medication list given to you today.  If you need a refill on your cardiac medications before your next appointment, please call your pharmacy.   Lab work: You will have labwork today: CMP, lipid panel, ProBNP  If you have labs (blood work) drawn today and your tests are completely normal, you will receive your results only by: Marland Kitchen MyChart Message (if you have MyChart) OR . A paper copy in the mail If you have any lab test that is abnormal or we need to change your treatment, we will call you to review the results.  Testing/Procedures: None  Follow-Up: At [facility], you and your health needs are our priority.  As part of our continuing mission to provide you with exceptional heart care, we have created designated Provider Care Teams.  These Care Teams include your primary Cardiologist (physician) and Advanced Practice Providers (APPs -  Physician Assistants and Nurse Practitioners) who all work together to provide you with the care you need, when you need it. You will need a follow up appointment in 6 months.  Please call our office 2 months in advance to schedule this appointment.

## 2018-04-20 DIAGNOSIS — D225 Melanocytic nevi of trunk: Secondary | ICD-10-CM | POA: Diagnosis not present

## 2018-04-20 DIAGNOSIS — L570 Actinic keratosis: Secondary | ICD-10-CM | POA: Diagnosis not present

## 2018-04-20 DIAGNOSIS — R600 Localized edema: Secondary | ICD-10-CM | POA: Diagnosis not present

## 2018-04-20 DIAGNOSIS — I8310 Varicose veins of unspecified lower extremity with inflammation: Secondary | ICD-10-CM | POA: Diagnosis not present

## 2018-04-20 DIAGNOSIS — B079 Viral wart, unspecified: Secondary | ICD-10-CM | POA: Diagnosis not present

## 2018-05-08 IMAGING — NM NM MISC PROCEDURE
3 series · 18 of 18 positions shown · non-contrast
Comparison: none

[Series 1: stress-gsp_(id)_sa · 6.4mm · 6.40mm/px · 6 of 512 frames shown]
[frame 43/512]
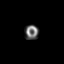
[frame 128/512]
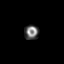
[frame 214/512]
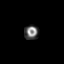
[frame 299/512]
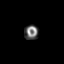
[frame 384/512]
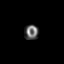
[frame 470/512]
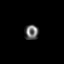

[Series 1: stress-sum-em_(id)_sa · 6.4mm · 6.40mm/px · 6 of 64 frames shown]
[frame 6/64]
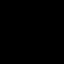
[frame 16/64]
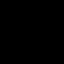
[frame 27/64]
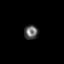
[frame 38/64]
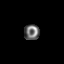
[frame 48/64]
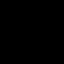
[frame 59/64]
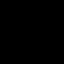

[Series 1: rest_(id)_sa · 6.4mm · 6.40mm/px · 6 of 64 frames shown]
[frame 6/64]
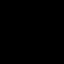
[frame 16/64]
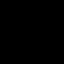
[frame 27/64]
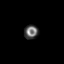
[frame 38/64]
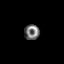
[frame 48/64]
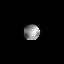
[frame 59/64]
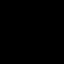

[18 of 18 positions shown; findings below may reference images not displayed]

Canned report from images found in remote index.

Refer to host system for actual result text.

## 2018-05-11 ENCOUNTER — Other Ambulatory Visit: Payer: Self-pay | Admitting: *Deleted

## 2018-05-11 MED ORDER — FUROSEMIDE 80 MG PO TABS: 80.0000 mg | ORAL_TABLET | Freq: Every day | ORAL | 1 refills | Status: DC

## 2018-05-11 NOTE — Telephone Encounter (Signed)
Refill for furosemide sent to [facility] in [place].

## 2018-06-03 ENCOUNTER — Telehealth: Payer: Self-pay | Admitting: Cardiology

## 2018-06-03 MED ORDER — ESOMEPRAZOLE MAGNESIUM 40 MG PO CPDR: 40.0000 mg | DELAYED_RELEASE_CAPSULE | Freq: Every day | ORAL | 1 refills | Status: DC

## 2018-06-03 NOTE — Addendum Note (Signed)
Addended by: Austin Miles on: 06/03/2018 11:18 AM   Modules accepted: Orders

## 2018-06-03 NOTE — Telephone Encounter (Signed)
nexium 40 mg 390 refill 3 take one daily

## 2018-06-03 NOTE — Telephone Encounter (Signed)
Refill for nexium sent to [facility] in [place] per Dr. Bettina Gavia. Patient informed.

## 2018-06-03 NOTE — Telephone Encounter (Signed)
Wants a prescription for Nexium

## 2018-06-03 NOTE — Telephone Encounter (Signed)
Patient is currently taking nexium 40 mg daily but it was prescribed by another provider. Will have Dr. Bettina Gavia advise to refill or not.

## 2018-06-16 DIAGNOSIS — I509 Heart failure, unspecified: Secondary | ICD-10-CM | POA: Diagnosis not present

## 2018-06-16 DIAGNOSIS — I70203 Unspecified atherosclerosis of native arteries of extremities, bilateral legs: Secondary | ICD-10-CM | POA: Diagnosis not present

## 2018-06-16 DIAGNOSIS — N183 Chronic kidney disease, stage 3 (moderate): Secondary | ICD-10-CM | POA: Diagnosis not present

## 2018-06-19 DIAGNOSIS — M9904 Segmental and somatic dysfunction of sacral region: Secondary | ICD-10-CM | POA: Insufficient documentation

## 2018-06-19 DIAGNOSIS — M545 Low back pain: Secondary | ICD-10-CM | POA: Diagnosis not present

## 2018-06-19 HISTORY — DX: Segmental and somatic dysfunction of sacral region: M99.04

## 2018-07-07 DIAGNOSIS — T7840XA Allergy, unspecified, initial encounter: Secondary | ICD-10-CM | POA: Diagnosis not present

## 2018-08-25 DIAGNOSIS — M533 Sacrococcygeal disorders, not elsewhere classified: Secondary | ICD-10-CM | POA: Diagnosis not present

## 2018-09-07 ENCOUNTER — Other Ambulatory Visit: Payer: Self-pay | Admitting: Cardiology

## 2018-09-08 DIAGNOSIS — N401 Enlarged prostate with lower urinary tract symptoms: Secondary | ICD-10-CM | POA: Diagnosis not present

## 2018-09-08 DIAGNOSIS — R351 Nocturia: Secondary | ICD-10-CM | POA: Diagnosis not present

## 2018-09-08 DIAGNOSIS — N3000 Acute cystitis without hematuria: Secondary | ICD-10-CM | POA: Diagnosis not present

## 2018-09-10 DIAGNOSIS — M533 Sacrococcygeal disorders, not elsewhere classified: Secondary | ICD-10-CM

## 2018-09-10 HISTORY — DX: Sacrococcygeal disorders, not elsewhere classified: M53.3

## 2018-09-30 DIAGNOSIS — S40811A Abrasion of right upper arm, initial encounter: Secondary | ICD-10-CM | POA: Diagnosis not present

## 2018-11-23 ENCOUNTER — Other Ambulatory Visit: Payer: Self-pay

## 2018-11-25 ENCOUNTER — Other Ambulatory Visit: Payer: Self-pay | Admitting: *Deleted

## 2018-11-25 ENCOUNTER — Telehealth: Payer: Self-pay | Admitting: Cardiology

## 2018-11-25 MED ORDER — GEMFIBROZIL 600 MG PO TABS: 300.0000 mg | ORAL_TABLET | Freq: Two times a day (BID) | ORAL | 0 refills | Status: DC

## 2018-11-25 NOTE — Telephone Encounter (Signed)
°*  STAT* If patient is at the pharmacy, call can be transferred to refill team.   1. Which medications need to be refilled? (please list name of each medication and dose if known) gemfibrozil (LOPID) 600 MG tablet   2. Which pharmacy/location (including street and city if local pharmacy) is medication to be sent to?  [facility] [place], [place] - [address] AT [address] [phone] (Phone) [phone] (Fax)    3. Do they need a 30 day or 90 day supply? 90 day

## 2018-12-21 ENCOUNTER — Other Ambulatory Visit: Payer: Self-pay | Admitting: Cardiology

## 2018-12-30 DIAGNOSIS — M7918 Myalgia, other site: Secondary | ICD-10-CM | POA: Diagnosis not present

## 2018-12-30 DIAGNOSIS — M545 Low back pain: Secondary | ICD-10-CM | POA: Diagnosis not present

## 2018-12-30 DIAGNOSIS — Z981 Arthrodesis status: Secondary | ICD-10-CM | POA: Diagnosis not present

## 2018-12-30 DIAGNOSIS — M961 Postlaminectomy syndrome, not elsewhere classified: Secondary | ICD-10-CM | POA: Diagnosis not present

## 2019-01-04 ENCOUNTER — Other Ambulatory Visit: Payer: Self-pay | Admitting: Cardiology

## 2019-01-08 ENCOUNTER — Other Ambulatory Visit: Payer: Self-pay

## 2019-01-08 MED ORDER — EZETIMIBE 10 MG PO TABS: 10.0000 mg | ORAL_TABLET | Freq: Every evening | ORAL | 0 refills | Status: DC

## 2019-01-27 DIAGNOSIS — M961 Postlaminectomy syndrome, not elsewhere classified: Secondary | ICD-10-CM | POA: Diagnosis not present

## 2019-01-27 DIAGNOSIS — M545 Low back pain: Secondary | ICD-10-CM | POA: Diagnosis not present

## 2019-01-27 DIAGNOSIS — M7918 Myalgia, other site: Secondary | ICD-10-CM | POA: Diagnosis not present

## 2019-01-27 DIAGNOSIS — Z981 Arthrodesis status: Secondary | ICD-10-CM | POA: Diagnosis not present

## 2019-02-02 DIAGNOSIS — Z23 Encounter for immunization: Secondary | ICD-10-CM | POA: Diagnosis not present

## 2019-02-09 ENCOUNTER — Other Ambulatory Visit: Payer: Self-pay | Admitting: Family

## 2019-02-09 ENCOUNTER — Other Ambulatory Visit: Payer: Self-pay

## 2019-02-09 MED ORDER — EZETIMIBE 10 MG PO TABS: 10.0000 mg | ORAL_TABLET | Freq: Every evening | ORAL | 0 refills | Status: DC

## 2019-02-09 NOTE — Progress Notes (Signed)
30 day refill Zetia sent to [facility] per request. Will require further refills at office visit 03/06/19.  Loel Dubonnet, NP

## 2019-02-10 ENCOUNTER — Other Ambulatory Visit: Payer: Self-pay | Admitting: Cardiology

## 2019-02-20 ENCOUNTER — Other Ambulatory Visit: Payer: Self-pay | Admitting: Cardiology

## 2019-02-24 ENCOUNTER — Other Ambulatory Visit: Payer: Self-pay | Admitting: Cardiology

## 2019-02-24 MED ORDER — ISOSORBIDE MONONITRATE ER 60 MG PO TB24: 60.0000 mg | ORAL_TABLET | Freq: Every day | ORAL | 0 refills | Status: DC

## 2019-02-24 MED ORDER — METOPROLOL SUCCINATE ER 25 MG PO TB24: 25.0000 mg | ORAL_TABLET | Freq: Every day | ORAL | 0 refills | Status: DC

## 2019-02-24 NOTE — Telephone Encounter (Signed)
°*  STAT* If patient is at the pharmacy, call can be transferred to refill team.   1. Which medications need to be refilled? (please list name of each medication and dose if known) Isosorbide mononitrate 60mg , and Metoprolol er succ 25mg   2. Which pharmacy/location (including street and city if local pharmacy) is medication to be sent to?[facility] [address] [place]  3. Do they need a 30 day or 90 day supply? [place]

## 2019-02-24 NOTE — Telephone Encounter (Signed)
Imdur 60 mg daily refilled, metoprolol succinate 25 mg daily refilled.

## 2019-03-02 ENCOUNTER — Telehealth: Payer: Self-pay | Admitting: Cardiology

## 2019-03-02 DIAGNOSIS — E7800 Pure hypercholesterolemia, unspecified: Secondary | ICD-10-CM | POA: Diagnosis not present

## 2019-03-02 DIAGNOSIS — I25118 Atherosclerotic heart disease of native coronary artery with other forms of angina pectoris: Secondary | ICD-10-CM | POA: Diagnosis not present

## 2019-03-02 DIAGNOSIS — Z79899 Other long term (current) drug therapy: Secondary | ICD-10-CM | POA: Diagnosis not present

## 2019-03-02 DIAGNOSIS — I4891 Unspecified atrial fibrillation: Secondary | ICD-10-CM | POA: Diagnosis not present

## 2019-03-02 DIAGNOSIS — E538 Deficiency of other specified B group vitamins: Secondary | ICD-10-CM | POA: Diagnosis not present

## 2019-03-02 DIAGNOSIS — I10 Essential (primary) hypertension: Secondary | ICD-10-CM | POA: Diagnosis not present

## 2019-03-02 NOTE — Telephone Encounter (Signed)
Call today from his primary care physician Cyndi Bender, PA he is in atrial fibrillation controlled rate he will send the EKG to [facility]

## 2019-03-04 ENCOUNTER — Other Ambulatory Visit: Payer: Self-pay

## 2019-03-04 MED ORDER — ISOSORBIDE MONONITRATE ER 60 MG PO TB24: 60.0000 mg | ORAL_TABLET | Freq: Every day | ORAL | 0 refills | Status: DC

## 2019-03-08 ENCOUNTER — Other Ambulatory Visit: Payer: Self-pay | Admitting: Family

## 2019-03-09 ENCOUNTER — Other Ambulatory Visit: Payer: Self-pay

## 2019-03-09 ENCOUNTER — Encounter: Payer: Self-pay | Admitting: *Deleted

## 2019-03-09 ENCOUNTER — Encounter: Payer: Self-pay | Admitting: Cardiology

## 2019-03-09 ENCOUNTER — Ambulatory Visit (INDEPENDENT_AMBULATORY_CARE_PROVIDER_SITE_OTHER): Payer: Medicare Other | Admitting: Cardiology

## 2019-03-09 VITALS — BP 118/80 | HR 106 | Ht 67.0 in | Wt 223.2 lb

## 2019-03-09 DIAGNOSIS — R0602 Shortness of breath: Secondary | ICD-10-CM

## 2019-03-09 DIAGNOSIS — E782 Mixed hyperlipidemia: Secondary | ICD-10-CM | POA: Diagnosis not present

## 2019-03-09 DIAGNOSIS — I4510 Unspecified right bundle-branch block: Secondary | ICD-10-CM | POA: Diagnosis not present

## 2019-03-09 DIAGNOSIS — I110 Hypertensive heart disease with heart failure: Secondary | ICD-10-CM

## 2019-03-09 DIAGNOSIS — I480 Paroxysmal atrial fibrillation: Secondary | ICD-10-CM | POA: Diagnosis not present

## 2019-03-09 DIAGNOSIS — I25119 Atherosclerotic heart disease of native coronary artery with unspecified angina pectoris: Secondary | ICD-10-CM

## 2019-03-09 MED ORDER — GEMFIBROZIL 600 MG PO TABS: ORAL_TABLET | ORAL | 0 refills | Status: DC

## 2019-03-09 MED ORDER — ISOSORBIDE MONONITRATE ER 60 MG PO TB24: 60.0000 mg | ORAL_TABLET | Freq: Every day | ORAL | 0 refills | Status: DC

## 2019-03-09 MED ORDER — FUROSEMIDE 80 MG PO TABS: ORAL_TABLET | ORAL | 0 refills | Status: DC

## 2019-03-09 MED ORDER — CLOPIDOGREL BISULFATE 75 MG PO TABS: 75.0000 mg | ORAL_TABLET | Freq: Every day | ORAL | 0 refills | Status: DC

## 2019-03-09 MED ORDER — NITROGLYCERIN 0.4 MG SL SUBL: 0.4000 mg | SUBLINGUAL_TABLET | SUBLINGUAL | 3 refills | Status: DC | PRN

## 2019-03-09 MED ORDER — ESOMEPRAZOLE MAGNESIUM 40 MG PO CPDR: DELAYED_RELEASE_CAPSULE | ORAL | 0 refills | Status: DC

## 2019-03-09 MED ORDER — RIVAROXABAN 20 MG PO TABS: 20.0000 mg | ORAL_TABLET | Freq: Every day | ORAL | 3 refills | Status: DC

## 2019-03-09 MED ORDER — METOPROLOL SUCCINATE ER 25 MG PO TB24: 25.0000 mg | ORAL_TABLET | Freq: Every day | ORAL | 0 refills | Status: DC

## 2019-03-09 MED ORDER — SIMVASTATIN 40 MG PO TABS: 40.0000 mg | ORAL_TABLET | Freq: Every day | ORAL | 0 refills | Status: DC

## 2019-03-09 NOTE — Progress Notes (Signed)
Cardiology Office Note:    Date:  03/09/2019   ID:  Andrew Hardy., DOB 14-Jun-1940, MRN [id number]  PCP:  [facility]  Cardiologist:  Shirlee More, MD    Referring MD: Lillard Anes,*    ASSESSMENT:    1. PAF (paroxysmal atrial fibrillation) ([place])   2. Hypertensive heart disease with heart failure ([place])   3. Coronary artery disease involving native coronary artery of native heart with angina pectoris ([place])   4. RBBB   5. Mixed hyperlipidemia    PLAN:    In order of problems listed above:  1. He is now back in atrial fibrillation persistent continue beta-blocker for rate control initiate anticoagulation and continue single antiplatelet drug.  Refer to EP for consideration of intervention note he is intolerant of amiodarone in the past with abnormal liver function. 2. Stable BP at target continue current antihypertensives and loop diuretic check echocardiogram 3. Stable CAD with exertional shortness of breath check myocardial perfusion study continue medical treatment including antiplatelet lipid-lowering with statin Zetia and Lopid along with oral nitrate. 4. Stable right bundle branch block EKG 5. Access recent labs with his PCP continue current multidrug regimen for dyslipidemia   Next appointment: 3 months   Medication Adjustments/Labs and Tests Ordered: Current medicines are reviewed at length with the patient today.  Concerns regarding medicines are outlined above.  No orders of the defined types were placed in this encounter.  No orders of the defined types were placed in this encounter.   Chief Complaint  Patient presents with   Atrial Fibrillation    History of Present Illness:    Andrew Tripplett. is a 78 y.o. male with a hx of CAD, NonSTEMI 04/22/16  with DES to [facility] for unstable angina 10/17/14 , PCI / Resolute Drug Eluting Stent of the proximal Left Anterior Descending Coronary Artery and PTCA of the ostial 1st Diagonal Coronary  Artery on 04/23/16 Dyslipidemia, Hypertensive heart disease with heart disease, and atypical atrial flutter last seen 04/08/2018. Compliance with diet, lifestyle and medications: Yes  Recent visit with his PCP and was found to be in atrial fibrillation. Today he is not doing well he just finds himself breathless with activity.  He has a background history of abnormal liver function and intolerance of amiodarone.  As opposed to antiarrhythmic drugs I am referring to my EP colleagues for consideration of EP lab based intervention.  The patient is in agreement initiate anticoagulation with Eliquis stop aspirin and keep him on clopidogrel with his vascular disease for shortness of breath in view of his CAD will check myocardial perfusion study echocardiogram access recent labs I will plan to see him in 3 months.  He has no edema orthopnea no angina or syncope.  He was unaware he was in atrial fibrillation Past Medical History:  Diagnosis Date   Anxiety    Arthritis    Atypical atrial flutter ([place]) 12/14/2014   Bradycardia, sinus 10/15/2014   Burning sensation of feet 01/17/2016   CAD, multiple vessel 10/18/2014   Overview:  DES to [facility] for unstable angina 10/17/14 Diagnostic Summary Severe stenosis of mid Cx likely cause of recent accelerating angina Diffuse severe stenosis of small caliber LAD unchanged from 2012 Normal LV function Paroxysmal SVT noted during procedure 04/23/16:  PCI / Resolute Drug Eluting Stent of the proximal Left Anterior Descending Coronary Artery. Successful PTCA of the ostial 1st Diagonal Coronary Artery   Chronic left-sided low back pain with sciatica 02/21/2016  Coronary artery disease 2003   s/p MI and numerous stents   Depression    Enlarged prostate without lower urinary tract symptoms (luts) 2012   frequent urination and nocturia; sees dr [name] Late in [place]   Essential hypertension 10/15/2014   Gastroesophageal reflux disease without esophagitis 04/21/2016    GERD (gastroesophageal reflux disease)    Hyperlipidemia    Hypertension    IHD (ischemic heart disease) 10/15/2014   Lumbar radiculopathy 02/21/2016   Myocardial infarction [facility]) 2003   NSTEMI (non-ST elevated myocardial infarction) ([place]) 04/24/2016   Overview:  LHC 04/24/15: Severe stenosis of the LAD & Diagonal bifurcation  LV ejection fraction is 55-60 %  Interventional Summary Successful Angiosculpt PCI / Resolute Drug Eluting Stent of the proximal LAD Successful PTCA of the ostial 1st Diagonal Coronary Artery   Pain in both feet 01/17/2016   Paroxysmal SVT (supraventricular tachycardia) ([place]) 11/16/2014   Precordial pain 10/15/2014   PVC's (premature ventricular contractions) 04/22/2016   RBBB 10/15/2014   Sleep disturbances 01/17/2016   Snoring 01/17/2016   Subscapularis (muscle) sprain 03/29/2011   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    Past Surgical History:  Procedure Laterality Date   CARDIAC CATHETERIZATION  10-2010   ptca/stent in [place]   CARDIAC CATHETERIZATION  04/23/2016   CORONARY ANGIOPLASTY     EYE SURGERY  05-1010   bil cataract ,iol   FRACTURE SURGERY     I&D EXTREMITY Right 02/23/2015   Procedure: RIGHT RING FINGER REVISION AMPUTATION;  Surgeon: Leanora Cover, MD;  Location: [facility];  Service: Orthopedics;  Laterality: Right;   KNEE ARTHROSCOPY W/ MENISCAL REPAIR  2000   left knee   LUMBAR EPIDURAL INJECTION  2011   low back pain; DDD   ring finger tramatic qamputation Right    SHOULDER ARTHROSCOPY  03/29/2011   Procedure: ARTHROSCOPY SHOULDER;  Surgeon: Augustin Schooling;  Location: [facility];  Service: Orthopedics;  Laterality: Right;  RIGHT SHOULDER ARTHROSCOPY WITH OPEN SUBSCAPULAR REPAIR   SHOULDER OPEN ROTATOR CUFF REPAIR     3 on rt shoulder; 2 on left   TRANSFORAMINAL LUMBAR INTERBODY FUSION (TLIF) WITH PEDICLE SCREW FIXATION 1 LEVEL N/A 04/24/2017   Procedure: TRANSFORAMINAL LUMBAR INTERBODY FUSION (TLIF) L4-5;  Surgeon: Melina Schools, MD;  Location: [facility];  Service: Orthopedics;  Laterality: N/A;  4 hrs    Current Medications: Current Meds  Medication Sig   acetaminophen (TYLENOL) 500 MG tablet Take 1,000 mg by mouth at bedtime as needed.    aspirin 81 MG chewable tablet Chew by mouth daily.   clopidogrel (PLAVIX) 75 MG tablet TAKE 1 TABLET BY MOUTH DAILY   Co-Enzyme Q-10 30 MG CAPS Take 100 mg by mouth daily.   Cyanocobalamin (B-12 PO) Take 1 tablet by mouth daily.   DULoxetine (CYMBALTA) 30 MG capsule Take 30 mg by mouth 2 (two) times daily.   EPINEPHrine (EPIPEN 2-PAK) 0.3 mg/0.3 mL IJ SOAJ injection    esomeprazole (NEXIUM) 40 MG capsule TAKE ONE CAPSULE BY MOUTH DAILY BEFORE BREAKFAST   ezetimibe (ZETIA) 10 MG tablet Take 1 tablet (10 mg total) by mouth every evening. Further refills will be ordered at office visit 03/06/19   finasteride (PROSCAR) 5 MG tablet Take 5 mg by mouth daily.   Flaxseed, Linseed, (FLAXSEED OIL MAX STR) 1300 MG CAPS Take 1,300 mg by mouth daily.   furosemide (LASIX) 80 MG tablet ALTERNATE 1/2 TABLET BY MOUTH EVERY DAILY WITH 1 TABLET THE OTHER DAY   gemfibrozil (LOPID)  600 MG tablet TAKE 1/2 TABLET BY MOUTH TWICE DAILY BEFORE A MEAL   Glucosamine HCl (GLUCOSAMINE PO) Take 1 tablet by mouth daily.   HYDROcodone-acetaminophen (NORCO/VICODIN) 5-325 MG tablet Take 1 tablet by mouth every 6 (six) hours as needed.   isosorbide mononitrate (IMDUR) 60 MG 24 hr tablet Take 1 tablet (60 mg total) by mouth daily.   metoprolol succinate (TOPROL-XL) 25 MG 24 hr tablet Take 1 tablet (25 mg total) by mouth daily. Take with or immediately following a meal.   minocycline (MINOCIN,DYNACIN) 50 MG capsule TAKE 1 CAPSULE BY MOUTH QD   Misc Natural Products (PUMPKIN SEED OIL) CAPS Take 1 capsule by mouth daily.   Multiple Vitamins-Minerals (MULTIVITAMINS THER. W/MINERALS) TABS Take 1 tablet by mouth daily.     nitroGLYCERIN (NITROSTAT) 0.4 MG SL tablet Place 1 tablet (0.4 mg total)  under the tongue every 5 (five) minutes as needed for chest pain. TAKE 1 TABLET UNDER THE TONGUE AS NEEDED AS DIRECTED FOR CHEST PAIN.   Omega-3 Fatty Acids (FISH OIL PO) Take 1 tablet by mouth daily.   potassium chloride SA (K-DUR) 20 MEQ tablet Take 1 tablet (20 mEq total) by mouth 2 (two) times daily.   simvastatin (ZOCOR) 40 MG tablet TAKE 1 TABLET BY MOUTH EVERY NIGHT AT BEDTIME   tamsulosin (FLOMAX) 0.4 MG CAPS capsule Take 0.4 mg by mouth daily.   traZODone (DESYREL) 100 MG tablet Take 100 mg by mouth at bedtime.   vitamin E 400 UNIT capsule Take 400 Units by mouth daily.     Allergies:   Buprenorphine hcl, Codeine, Hydrocodone, and Percocet [oxycodone-acetaminophen]   Social History   Socioeconomic History   Marital status: Married    Spouse name: Not on file   Number of children: Not on file   Years of education: Not on file   Highest education level: Not on file  Occupational History   Not on file  Social Needs   Financial resource strain: Not on file   Food insecurity    Worry: Not on file    Inability: Not on file   Transportation needs    Medical: Not on file    Non-medical: Not on file  Tobacco Use   Smoking status: Former Smoker    Packs/day: 1.00    Years: 20.00    Pack years: 20.00    Types: Cigarettes    Quit date: 03/20/1994    Years since quitting: 24.9   Smokeless tobacco: Former Systems developer    Types: [place] date: 03/20/1996  Substance and Sexual Activity   Alcohol use: Yes    Alcohol/week: 6.0 standard drinks    Types: 6 Cans of beer per week   Drug use: No   Sexual activity: Not on file  Lifestyle   Physical activity    Days per week: Not on file    Minutes per session: Not on file   Stress: Not on file  Relationships   Social connections    Talks on phone: Not on file    Gets together: Not on file    Attends religious service: Not on file    Active member of club or organization: Not on file    Attends meetings of  clubs or organizations: Not on file    Relationship status: Not on file  Other Topics Concern   Not on file  Social History Narrative   Not on file     Family History: The patient's family history  includes Cancer in his father and sister; Heart disease in his father and mother. ROS:   Please see the history of present illness.    All other systems reviewed and are negative.  EKGs/Labs/Other Studies Reviewed:    The following studies were reviewed today:  EKG:  EKG ordered today and personally reviewed.  The ekg ordered today demonstrates rate controlled atrial fibrillation right bundle branch block  Recent Labs: 04/08/2018: ALT 14; BUN 17; Creatinine, Ser 1.16; NT-Pro BNP 78; Potassium 4.4; Sodium 140  Recent Lipid Panel    Component Value Date/Time   CHOL 152 04/08/2018 0921   TRIG 198 (H) 04/08/2018 0921   HDL 48 04/08/2018 0921   CHOLHDL 3.2 04/08/2018 0921   LDLCALC 64 04/08/2018 0921    Physical Exam:    VS:  BP 118/80 (BP Location: Right Arm, Patient Position: Sitting, Cuff Size: Large)    Pulse (!) 106    Ht 5\' 7"  (1.702 m)    Wt 223 lb 3.2 oz (101.2 kg)    SpO2 97%    BMI 34.96 kg/m     Wt Readings from Last 3 Encounters:  03/09/19 223 lb 3.2 oz (101.2 kg)  04/08/18 225 lb 2 oz (102.1 kg)  07/31/17 223 lb 8 oz (101.4 kg)     GEN:  Well nourished, well developed in no acute distress HEENT: Normal NECK: No JVD; No carotid bruits LYMPHATICS: No lymphadenopathy CARDIAC: Irregular variable first heart sound  no murmurs, rubs, gallops RESPIRATORY:  Clear to auscultation without rales, wheezing or rhonchi  ABDOMEN: Soft, non-tender, non-distended MUSCULOSKELETAL:  No edema; No deformity  SKIN: Warm and dry NEUROLOGIC:  Alert and oriented x 3 PSYCHIATRIC:  Normal affect    Signed, Shirlee More, MD  03/09/2019 11:09 AM    [place]

## 2019-03-09 NOTE — Patient Instructions (Signed)
Medication Instructions:  Your physician has recommended you make the following change in your medication:  STOP aspirin  START rivaroxaban (xarelto) 20 mg: Take 1 tablet daily  *If you need a refill on your cardiac medications before your next appointment, please call your pharmacy*  Lab Work: None  If you have labs (blood work) drawn today and your tests are completely normal, you will receive your results only by: Marland Kitchen MyChart Message (if you have MyChart) OR . A paper copy in the mail If you have any lab test that is abnormal or we need to change your treatment, we will call you to review the results.  Testing/Procedures: You had an EKG today.   Your physician has requested that you have an echocardiogram. Echocardiography is a painless test that uses sound waves to create images of your heart. It provides your doctor with information about the size and shape of your heart and how well your heart's chambers and valves are working. This procedure takes approximately one hour. There are no restrictions for this procedure.  Your physician has requested that you have a lexiscan myoview. For further information please visit [email or web address]. Please follow instruction sheet, as given.  You have been referred to see an electrophysiologist, Dr. Curt Bears. This appointment will be scheduled at the front desk.   Follow-Up: At [facility], you and your health needs are our priority.  As part of our continuing mission to provide you with exceptional heart care, we have created designated Provider Care Teams.  These Care Teams include your primary Cardiologist (physician) and Advanced Practice Providers (APPs -  Physician Assistants and Nurse Practitioners) who all work together to provide you with the care you need, when you need it.  Your next appointment:   3 months  The format for your next appointment:   In Person  Provider:   Shirlee More, MD    Rivaroxaban oral tablets What is  this medicine? RIVAROXABAN (ri va ROX a ban) is an anticoagulant (blood thinner). It is used to treat blood clots in the lungs or in the veins. It is also used to prevent blood clots in the lungs or in the veins. It is also used to lower the chance of stroke in people with a medical condition called atrial fibrillation. This medicine may be used for other purposes; ask your health care provider or pharmacist if you have questions. COMMON BRAND NAME(S): Xarelto, Xarelto Starter Pack What should I tell my health care provider before I take this medicine? They need to know if you have any of these conditions:  antiphospholipid antibody syndrome  artificial heart valve  bleeding disorders  bleeding in the brain  blood in your stools (black or tarry stools) or if you have blood in your vomit  history of blood clots  history of stomach bleeding  kidney disease  liver disease  low blood counts, like low white cell, platelet, or red cell counts  recent or planned spinal or epidural procedure  take medicines that treat or prevent blood clots  an unusual or allergic reaction to rivaroxaban, other medicines, foods, dyes, or preservatives  pregnant or trying to get pregnant  breast-feeding How should I use this medicine? Take this medicine by mouth with a glass of water. Follow the directions on the prescription label. Take your medicine at regular intervals. Do not take it more often than directed. Do not stop taking except on your doctor's advice. Stopping this medicine may increase your risk of a blood  clot. Be sure to refill your prescription before you run out of medicine. If you are taking this medicine after hip or knee replacement surgery, take it with or without food. If you are taking this medicine for atrial fibrillation, take it with your evening meal. If you are taking this medicine to treat blood clots, take it with food at the same time each day. If you are unable to swallow  your tablet, you may crush the tablet and mix it in applesauce. Then, immediately eat the applesauce. You should eat more food right after you eat the applesauce containing the crushed tablet. Talk to your pediatrician regarding the use of this medicine in children. Special care may be needed. Overdosage: If you think you have taken too much of this medicine contact a poison control center or emergency room at once. NOTE: This medicine is only for you. Do not share this medicine with others. What if I miss a dose? If you take your medicine once a day and miss a dose, take the missed dose as soon as you remember. If it is almost time for your next dose, take only that dose. Do not take double or extra doses. If you take your medicine twice a day and miss a dose, take the missed dose immediately. In this instance, 2 tablets may be taken at the same time. The next day you should take 1 tablet twice a day as directed. What may interact with this medicine? Do not take this medicine with any of the following medications:  defibrotide This medicine may also interact with the following medications:  aspirin and aspirin-like medicines  certain antibiotics like erythromycin, azithromycin, and clarithromycin  certain medicines for fungal infections like ketoconazole and itraconazole  certain medicines for irregular heart beat like amiodarone, quinidine, dronedarone  certain medicines for seizures like carbamazepine, phenytoin  certain medicines that treat or prevent blood clots like warfarin, enoxaparin, and dalteparin  conivaptan  felodipine  indinavir  lopinavir; ritonavir  NSAIDS, medicines for pain and inflammation, like ibuprofen or naproxen  ranolazine  rifampin  ritonavir  SNRIs, medicines for depression, like desvenlafaxine, duloxetine, levomilnacipran, venlafaxine  SSRIs, medicines for depression, like citalopram, escitalopram, fluoxetine, fluvoxamine, paroxetine, sertraline   St. John's wort  verapamil This list may not describe all possible interactions. Give your health care provider a list of all the medicines, herbs, non-prescription drugs, or dietary supplements you use. Also tell them if you smoke, drink alcohol, or use illegal drugs. Some items may interact with your medicine. What should I watch for while using this medicine? Visit your healthcare professional for regular checks on your progress. You may need blood work done while you are taking this medicine. Your condition will be monitored carefully while you are receiving this medicine. It is important not to miss any appointments. Avoid sports and activities that might cause injury while you are using this medicine. Severe falls or injuries can cause unseen bleeding. Be careful when using sharp tools or knives. Consider using an Copy. Take special care brushing or flossing your teeth. Report any injuries, bruising, or red spots on the skin to your healthcare professional. If you are going to need surgery or other procedure, tell your healthcare professional that you are taking this medicine. Wear a medical ID bracelet or chain. Carry a card that describes your disease and details of your medicine and dosage times. What side effects may I notice from receiving this medicine? Side effects that you should report to your  doctor or health care professional as soon as possible:  allergic reactions like skin rash, itching or hives, swelling of the face, lips, or tongue  back pain  redness, blistering, peeling or loosening of the skin, including inside the mouth  signs and symptoms of bleeding such as bloody or black, tarry stools; red or dark-brown urine; spitting up blood or brown material that looks like coffee grounds; red spots on the skin; unusual bruising or bleeding from the eye, gums, or nose  signs and symptoms of a blood clot such as chest pain; shortness of breath; pain, swelling, or  warmth in the leg  signs and symptoms of a stroke such as changes in vision; confusion; trouble speaking or understanding; severe headaches; sudden numbness or weakness of the face, arm or leg; trouble walking; dizziness; loss of coordination Side effects that usually do not require medical attention (report to your doctor or health care professional if they continue or are bothersome):  dizziness  muscle pain This list may not describe all possible side effects. Call your doctor for medical advice about side effects. You may report side effects to FDA at 1-800-FDA-1088. Where should I keep my medicine? Keep out of the reach of children. Store at room temperature between 15 and 30 degrees C (59 and 86 degrees F). Throw away any unused medicine after the expiration date. NOTE: This sheet is a summary. It may not cover all possible information. If you have questions about this medicine, talk to your doctor, pharmacist, or health care provider.  2020 Elsevier/Gold Standard (2018-07-06 09:45:59)     Echocardiogram An echocardiogram is a procedure that uses painless sound waves (ultrasound) to produce an image of the heart. Images from an echocardiogram can provide important information about:  Signs of coronary artery disease (CAD).  Aneurysm detection. An aneurysm is a weak or damaged part of an artery wall that bulges out from the normal force of blood pumping through the body.  Heart size and shape. Changes in the size or shape of the heart can be associated with certain conditions, including heart failure, aneurysm, and CAD.  Heart muscle function.  Heart valve function.  Signs of a past heart attack.  Fluid buildup around the heart.  Thickening of the heart muscle.  A tumor or infectious growth around the heart valves. Tell a health care provider about:  Any allergies you have.  All medicines you are taking, including vitamins, herbs, eye drops, creams, and  over-the-counter medicines.  Any blood disorders you have.  Any surgeries you have had.  Any medical conditions you have.  Whether you are pregnant or may be pregnant. What are the risks? Generally, this is a safe procedure. However, problems may occur, including:  Allergic reaction to dye (contrast) that may be used during the procedure. What happens before the procedure? No specific preparation is needed. You may eat and drink normally. What happens during the procedure?   An IV tube may be inserted into one of your veins.  You may receive contrast through this tube. A contrast is an injection that improves the quality of the pictures from your heart.  A gel will be applied to your chest.  A wand-like tool (transducer) will be moved over your chest. The gel will help to transmit the sound waves from the transducer.  The sound waves will harmlessly bounce off of your heart to allow the heart images to be captured in real-time motion. The images will be recorded on a computer.  The procedure may vary among health care providers and hospitals. What happens after the procedure?  You may return to your normal, everyday life, including diet, activities, and medicines, unless your health care provider tells you not to do that. Summary  An echocardiogram is a procedure that uses painless sound waves (ultrasound) to produce an image of the heart.  Images from an echocardiogram can provide important information about the size and shape of your heart, heart muscle function, heart valve function, and fluid buildup around your heart.  You do not need to do anything to prepare before this procedure. You may eat and drink normally.  After the echocardiogram is completed, you may return to your normal, everyday life, unless your health care provider tells you not to do that. This information is not intended to replace advice given to you by your health care provider. Make sure you discuss  any questions you have with your health care provider. Document Released: 04/05/2000 Document Revised: 07/30/2018 Document Reviewed: 05/11/2016 Elsevier Patient Education  2020 [address].    Cardiac Nuclear Scan A cardiac nuclear scan is a test that is done to check the flow of blood to your heart. It is done when you are resting and when you are exercising. The test looks for problems such as:  Not enough blood reaching a portion of the heart.  The heart muscle not working as it should. You may need this test if:  You have heart disease.  You have had lab results that are not normal.  You have had heart surgery or a balloon procedure to open up blocked arteries (angioplasty).  You have chest pain.  You have shortness of breath. In this test, a special dye (tracer) is put into your bloodstream. The tracer will travel to your heart. A camera will then take pictures of your heart to see how the tracer moves through your heart. This test is usually done at a hospital and takes 2-4 hours. Tell a doctor about:  Any allergies you have.  All medicines you are taking, including vitamins, herbs, eye drops, creams, and over-the-counter medicines.  Any problems you or family members have had with anesthetic medicines.  Any blood disorders you have.  Any surgeries you have had.  Any medical conditions you have.  Whether you are pregnant or may be pregnant. What are the risks? Generally, this is a safe test. However, problems may occur, such as:  Serious chest pain and heart attack. This is only a risk if the stress portion of the test is done.  Rapid heartbeat.  A feeling of warmth in your chest. This feeling usually does not last long.  Allergic reaction to the tracer. What happens before the test?  Ask your doctor about changing or stopping your normal medicines. This is important.  Follow instructions from your doctor about what you cannot eat or drink.  Remove your  jewelry on the day of the test. What happens during the test?  An IV tube will be inserted into one of your veins.  Your doctor will give you a small amount of tracer through the IV tube.  You will wait for 20-40 minutes while the tracer moves through your bloodstream.  Your heart will be monitored with an electrocardiogram (ECG).  You will lie down on an exam table.  Pictures of your heart will be taken for about 15-20 minutes.  You may also have a stress test. For this test, one of these things may be  done: ? You will be asked to exercise on a treadmill or a stationary bike. ? You will be given medicines that will make your heart work harder. This is done if you are unable to exercise.  When blood flow to your heart has peaked, a tracer will again be given through the IV tube.  After 20-40 minutes, you will get back on the exam table. More pictures will be taken of your heart.  Depending on the tracer that is used, more pictures may need to be taken 3-4 hours later.  Your IV tube will be removed when the test is over. The test may vary among doctors and hospitals. What happens after the test?  Ask your doctor: ? Whether you can return to your normal schedule, including diet, activities, and medicines. ? Whether you should drink more fluids. This will help to remove the tracer from your body. Drink enough fluid to keep your pee (urine) pale yellow.  Ask your doctor, or the department that is doing the test: ? When will my results be ready? ? How will I get my results? Summary  A cardiac nuclear scan is a test that is done to check the flow of blood to your heart.  Tell your doctor whether you are pregnant or may be pregnant.  Before the test, ask your doctor about changing or stopping your normal medicines. This is important.  Ask your doctor whether you can return to your normal activities. You may be asked to drink more fluids. This information is not intended to  replace advice given to you by your health care provider. Make sure you discuss any questions you have with your health care provider. Document Released: 09/22/2017 Document Revised: 07/29/2018 Document Reviewed: 09/22/2017 Elsevier Patient Education  2020 [employer].

## 2019-03-23 ENCOUNTER — Telehealth: Payer: Self-pay | Admitting: *Deleted

## 2019-03-23 NOTE — Telephone Encounter (Signed)
Patient given detailed instructions per Myocardial Perfusion Study Information Sheet for the test on 03/25/2019 at 0745. Patient notified to arrive 15 minutes early and that it is imperative to arrive on time for appointment to keep from having the test rescheduled.  If you need to cancel or reschedule your appointment, please call the office within 24 hours of your appointment. . Patient verbalized understanding.Jariel Drost, Ranae Palms

## 2019-03-24 DIAGNOSIS — M47816 Spondylosis without myelopathy or radiculopathy, lumbar region: Secondary | ICD-10-CM | POA: Diagnosis not present

## 2019-03-24 DIAGNOSIS — G894 Chronic pain syndrome: Secondary | ICD-10-CM | POA: Diagnosis not present

## 2019-03-24 DIAGNOSIS — M545 Low back pain: Secondary | ICD-10-CM | POA: Diagnosis not present

## 2019-03-25 ENCOUNTER — Ambulatory Visit (INDEPENDENT_AMBULATORY_CARE_PROVIDER_SITE_OTHER): Payer: Medicare Other

## 2019-03-25 ENCOUNTER — Other Ambulatory Visit: Payer: Self-pay

## 2019-03-25 ENCOUNTER — Telehealth: Payer: Self-pay | Admitting: Cardiology

## 2019-03-25 DIAGNOSIS — R0602 Shortness of breath: Secondary | ICD-10-CM

## 2019-03-25 LAB — MYOCARDIAL PERFUSION IMAGING
Peak HR: 114 {beats}/min
Rest HR: 88 {beats}/min
SDS: 0
SRS: 2
SSS: 2
TID: 1.23

## 2019-03-25 MED ORDER — REGADENOSON 0.4 MG/5ML IV SOLN
0.4000 mg | Freq: Once | INTRAVENOUS | Status: AC
  Administered 2019-03-25: 0.4 mg via INTRAVENOUS

## 2019-03-25 MED ORDER — TECHNETIUM TC 99M TETROFOSMIN IV KIT
10.6000 | PACK | Freq: Once | INTRAVENOUS | Status: AC | PRN
  Administered 2019-03-25: 10.6 via INTRAVENOUS

## 2019-03-25 MED ORDER — TECHNETIUM TC 99M TETROFOSMIN IV KIT
32.4000 | PACK | Freq: Once | INTRAVENOUS | Status: AC | PRN
  Administered 2019-03-25: 32.4 via INTRAVENOUS

## 2019-03-25 NOTE — Telephone Encounter (Signed)
Returned patient's call and advised that there are no available xarelto 20 mg samples in our [facility] or [place] offices at this time. Advised patient to check with his PCP to see if they have any samples for pick up. Patient verbalized understanding. No further questions.

## 2019-03-25 NOTE — Telephone Encounter (Signed)
Patient would like samples of Xarelto. Patient in donut hole.Marland Kitchen

## 2019-03-29 ENCOUNTER — Telehealth: Payer: Self-pay | Admitting: Cardiology

## 2019-03-29 DIAGNOSIS — R040 Epistaxis: Secondary | ICD-10-CM

## 2019-03-29 NOTE — Addendum Note (Signed)
Addended by: Austin Miles on: 03/29/2019 02:36 PM   Modules accepted: Orders

## 2019-03-29 NOTE — Telephone Encounter (Signed)
Spoke with patient who states he has been having frequent nosebleeds for the past week. Patient confirms taking plavix 75 mg daily and xarelto 20 mg daily as prescribed.   Spoke with Dr. Bettina Gavia who advised patient should be referred to see an ENT doctor, Dr. Gaylyn Cheers. Referral has been placed and needed paperwork has been sent to Dr. Lynann Beaver office. Advised patient he will be contacted to schedule this appointment. In the meantime, patient informed that Dr. Bettina Gavia advised for him to stop taking plavix and continue taking xarelto as prescribed. Patient is agreeable to plan and verbalized understanding. No further questions.

## 2019-03-29 NOTE — Telephone Encounter (Signed)
Thinks xarelto is making his nose bleed

## 2019-03-31 ENCOUNTER — Other Ambulatory Visit: Payer: Self-pay | Admitting: Cardiology

## 2019-04-02 DIAGNOSIS — Z6834 Body mass index (BMI) 34.0-34.9, adult: Secondary | ICD-10-CM | POA: Diagnosis not present

## 2019-04-02 DIAGNOSIS — G4700 Insomnia, unspecified: Secondary | ICD-10-CM | POA: Diagnosis not present

## 2019-04-02 DIAGNOSIS — G4733 Obstructive sleep apnea (adult) (pediatric): Secondary | ICD-10-CM | POA: Diagnosis not present

## 2019-04-26 ENCOUNTER — Ambulatory Visit (INDEPENDENT_AMBULATORY_CARE_PROVIDER_SITE_OTHER): Payer: Medicare Other | Admitting: Cardiology

## 2019-04-26 ENCOUNTER — Other Ambulatory Visit: Payer: Self-pay

## 2019-04-26 ENCOUNTER — Encounter: Payer: Self-pay | Admitting: Cardiology

## 2019-04-26 VITALS — BP 122/83 | HR 119 | Ht 67.0 in | Wt 223.4 lb

## 2019-04-26 DIAGNOSIS — I4819 Other persistent atrial fibrillation: Secondary | ICD-10-CM

## 2019-04-26 MED ORDER — DRONEDARONE HCL 400 MG PO TABS: 400.0000 mg | ORAL_TABLET | Freq: Two times a day (BID) | ORAL | 0 refills | Status: DC

## 2019-04-26 NOTE — Progress Notes (Signed)
I just had a spot that he wanted had a leadership   Electrophysiology Office Note   Date:  04/26/2019   ID:  [name]., DOB 03/22/1941, MRN [id number]  PCP:  [facility]  Cardiologist:  Bettina Gavia Primary Electrophysiologist:  Trachelle Low Meredith Leeds, MD    Chief Complaint: AF [name] 1 month History of Present Illness: Andrew Hardy. is a 79 y.o. male who is being seen today for the evaluation of AF at the request of Bettina Gavia, Hilton Cork, MD. Presenting today for electrophysiology evaluation.  He has a history significant for coronary artery disease with multiple stents, atypical atrial flutter, atrial fibrillation, hyperlipidemia, hypertension.  He has been on amiodarone in the past though had abnormal LFTs.   Today, he denies symptoms of palpitations, chest pain, orthopnea, PND, lower extremity edema, claudication, dizziness, presyncope, syncope, bleeding, or neurologic sequela. The patient is tolerating medications without difficulties.  His main symptoms are shortness of breath.  He is able to do most of his daily activities, climbing stairs and much in the way of exertion does make him more short of breath.   Past Medical History:  Diagnosis Date  . Anxiety   . Arthritis   . Atypical atrial flutter ([place]) 12/14/2014  . Bradycardia, sinus 10/15/2014  . Burning sensation of feet 01/17/2016  . CAD, multiple vessel 10/18/2014   Overview:  DES to [facility] for unstable angina 10/17/14 Diagnostic Summary Severe stenosis of mid Cx likely cause of recent accelerating angina Diffuse severe stenosis of small caliber LAD unchanged from 2012 Normal LV function Paroxysmal SVT noted during procedure 04/23/16:  PCI / Resolute Drug Eluting Stent of the proximal Left Anterior Descending Coronary Artery. Successful PTCA of the ostial 1st Diagonal Coronary Artery  . 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 Coronary artery disease 2003   s/p MI and numerous stents  . Depression    . Enlarged prostate without lower urinary tract symptoms (luts) 2012   frequent urination and nocturia; sees dr [name] Late in [place]  . Essential hypertension 10/15/2014  . Gastroesophageal reflux disease without esophagitis 04/21/2016  . GERD (gastroesophageal reflux disease)   . Hyperlipidemia   . Hypertension   . IHD (ischemic heart disease) 10/15/2014  . Lumbar radiculopathy 02/21/2016  . Myocardial infarction ([place]) 2003  . NSTEMI (non-ST elevated myocardial infarction) ([place]) 04/24/2016   Overview:  LHC 04/24/15: Severe stenosis of the LAD & Diagonal bifurcation  LV ejection fraction is 55-60 %  Interventional Summary Successful Angiosculpt PCI / Resolute Drug Eluting Stent of the proximal LAD Successful PTCA of the ostial 1st Diagonal Coronary Artery  . Pain in both feet 01/17/2016  . Paroxysmal SVT (supraventricular tachycardia) ([place]) 11/16/2014  . Precordial pain 10/15/2014  . PVC's (premature ventricular contractions) 04/22/2016  . RBBB 10/15/2014  . Sleep disturbances 01/17/2016  . Snoring 01/17/2016  . Subscapularis (muscle) sprain 03/29/2011  . 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 CARDIAC CATHETERIZATION  10-2010   ptca/stent in [place]  . CARDIAC CATHETERIZATION  04/23/2016  . CORONARY ANGIOPLASTY    . EYE SURGERY  05-1010   bil cataract ,iol  . FRACTURE SURGERY    . I & D EXTREMITY Right 02/23/2015   Procedure: RIGHT RING FINGER REVISION AMPUTATION;  Surgeon: Leanora Cover, MD;  Location: [place];  Service: Orthopedics;  Laterality: Right;  . KNEE ARTHROSCOPY W/ MENISCAL REPAIR  2000   left  knee  . LUMBAR EPIDURAL INJECTION  2011   low back pain; DDD  . ring finger tramatic qamputation Right   . SHOULDER ARTHROSCOPY  03/29/2011   Procedure: ARTHROSCOPY SHOULDER;  Surgeon: Augustin Schooling;  Location: [place];  Service: Orthopedics;  Laterality: Right;  RIGHT SHOULDER ARTHROSCOPY WITH OPEN SUBSCAPULAR REPAIR  .  SHOULDER OPEN ROTATOR CUFF REPAIR     3 on rt shoulder; 2 on left  . TRANSFORAMINAL LUMBAR INTERBODY FUSION (TLIF) WITH PEDICLE SCREW FIXATION 1 LEVEL N/A 04/24/2017   Procedure: TRANSFORAMINAL LUMBAR INTERBODY FUSION (TLIF) L4-5;  Surgeon: Melina Schools, MD;  Location: [place];  Service: Orthopedics;  Laterality: N/A;  4 hrs     Current Outpatient Medications  Medication Sig Dispense Refill  . acetaminophen (TYLENOL) 500 MG tablet Take 1,000 mg by mouth at bedtime as needed.     Marland Kitchen Co-Enzyme Q-10 30 MG CAPS Take 100 mg by mouth daily.    . Cyanocobalamin (B-12 PO) Take 1 tablet by mouth daily.    . DULoxetine (CYMBALTA) 30 MG capsule Take 30 mg by mouth 2 (two) times daily.    Marland Kitchen EPINEPHrine (EPIPEN 2-PAK) 0.3 mg/0.3 mL IJ SOAJ injection     . esomeprazole (NEXIUM) 40 MG capsule TAKE ONE CAPSULE BY MOUTH DAILY BEFORE BREAKFAST 90 capsule 0  . ezetimibe (ZETIA) 10 MG tablet TAKE 1 TABLET BY MOUTH EVERY EVENING 90 tablet 0  . finasteride (PROSCAR) 5 MG tablet Take 5 mg by mouth daily.    . Flaxseed, Linseed, (FLAXSEED OIL MAX STR) 1300 MG CAPS Take 1,300 mg by mouth daily.    . furosemide (LASIX) 80 MG tablet ALTERNATE 1/2 TABLET BY MOUTH EVERY DAILY WITH 1 TABLET THE OTHER DAY 90 tablet 0  . gemfibrozil (LOPID) 600 MG tablet TAKE 1/2 TABLET BY MOUTH TWICE DAILY BEFORE A MEAL 90 tablet 0  . Glucosamine HCl (GLUCOSAMINE PO) Take 1 tablet by mouth daily.    Marland Kitchen HYDROcodone-acetaminophen (NORCO/VICODIN) 5-325 MG tablet Take 1 tablet by mouth every 6 (six) hours as needed.    . isosorbide mononitrate (IMDUR) 60 MG 24 hr tablet Take 1 tablet (60 mg total) by mouth daily. 90 tablet 0  . metoprolol succinate (TOPROL-XL) 25 MG 24 hr tablet Take 1 tablet (25 mg total) by mouth daily. Take with or immediately following a meal. 90 tablet 0  . minocycline (MINOCIN,DYNACIN) 50 MG capsule TAKE 1 CAPSULE BY MOUTH QD  2  . Misc Natural Products (PUMPKIN SEED OIL) CAPS Take 1 capsule by mouth daily.    . Multiple  Vitamins-Minerals (MULTIVITAMINS THER. W/MINERALS) TABS Take 1 tablet by mouth daily.      . nitroGLYCERIN (NITROSTAT) 0.4 MG SL tablet Place 1 tablet (0.4 mg total) under the tongue every 5 (five) minutes as needed for chest pain. TAKE 1 TABLET UNDER THE TONGUE AS NEEDED AS DIRECTED FOR CHEST PAIN. 25 tablet 3  . Omega-3 Fatty Acids (FISH OIL PO) Take 1 tablet by mouth daily.    . potassium chloride SA (KLOR-CON) 20 MEQ tablet TAKE 1 TABLET BY MOUTH TWICE DAILY 180 tablet 1  . rivaroxaban (XARELTO) 20 MG TABS tablet Take 1 tablet (20 mg total) by mouth daily with supper. 30 tablet 3  . simvastatin (ZOCOR) 40 MG tablet Take 1 tablet (40 mg total) by mouth at bedtime. 90 tablet 0  . tamsulosin (FLOMAX) 0.4 MG CAPS capsule Take 0.4 mg by mouth daily.    . traZODone (DESYREL) 100 MG tablet Take 100  mg by mouth at bedtime.    . vitamin E 400 UNIT capsule Take 400 Units by mouth daily.     No current facility-administered medications for this visit.    Allergies:   Buprenorphine hcl, Codeine, Hydrocodone, and Percocet [oxycodone-acetaminophen]   Social History:  The patient  reports that he quit smoking about 25 years ago. His smoking use included cigarettes. He has a 20.00 pack-year smoking history. He quit smokeless tobacco use about 23 years ago.  His smokeless tobacco use included chew. He reports current alcohol use of about 6.0 standard drinks of alcohol per week. He reports that he does not use drugs.   Family History:  The patient's family history includes Cancer in his father and sister; Heart disease in his father and mother.    ROS:  Please see the history of present illness.   Otherwise, review of systems is positive for none.   All other systems are reviewed and negative.    PHYSICAL EXAM: VS:  BP 122/83   Pulse (!) 119   Ht 5\' 7"  (1.702 m)   Wt 223 lb 6.4 oz (101.3 kg)   SpO2 95%   BMI 34.99 kg/m  , BMI Body mass index is 34.99 kg/m. GEN: Well nourished, well developed, in no  acute distress  HEENT: normal  Neck: no JVD, carotid bruits, or masses Cardiac: Irregular; no murmurs, rubs, or gallops,no edema  Respiratory:  clear to auscultation bilaterally, normal work of breathing GI: soft, nontender, nondistended, + BS MS: no deformity or atrophy  Skin: warm and dry Neuro:  Strength and sensation are intact Psych: euthymic mood, full affect  EKG:  EKG is ordered today. Personal review of the ekg ordered shows atrial fibrillation, rate 119, right bundle branch block   Recent Labs: No results found for requested labs within last 8760 hours.    Lipid Panel     Component Value Date/Time   CHOL 152 04/08/2018 0921   TRIG 198 (H) 04/08/2018 0921   HDL 48 04/08/2018 0921   CHOLHDL 3.2 04/08/2018 0921   LDLCALC 64 04/08/2018 0921     Wt Readings from Last 3 Encounters:  04/26/19 223 lb 6.4 oz (101.3 kg)  03/25/19 223 lb (101.2 kg)  03/09/19 223 lb 3.2 oz (101.2 kg)      Other studies Reviewed: Additional studies/ records that were reviewed today include: SPECT 03/25/19  Review of the above records today demonstrates:   There was no ST segment deviation noted during stress.  No T wave inversion was noted during stress.  Nongated study due to atrial fibrillation.  The study is normal.  This is a low risk study.      ASSESSMENT AND PLAN:  1.  Persistent atrial fibrillation: Currently on Xarelto.  CHA2DS2-VASc of 4.  He is in atrial fibrillation today.  He is short of breath.  He would likely benefit from a rhythm control strategy.  We Mac Dowdell thus plan for cardioversion.  I Atwood Adcock have him check on the cost of Multaq.  I did speak with him about ablation, but he would like to avoid this for now.  2.  Coronary artery disease: No current chest pain  3.  Hypertension: Currently well controlled  4.  Hyperlipidemia: Plan per primary cardiology    Current medicines are reviewed at length with the patient today.   The patient does not have concerns  regarding his medicines.  The following changes were made today:  none  Labs/ tests ordered today  include:  Orders Placed This Encounter  Procedures  . EKG 12-Lead   Case discussed with primary cardiology Disposition:   FU with [place] 3 months  Signed, Ryana Montecalvo Meredith Leeds, MD  04/26/2019 11:51 AM     [facility] [address] [place] [address] [phone] (office) [phone] (fax)

## 2019-04-26 NOTE — Patient Instructions (Addendum)
Medication Instructions:  Your physician recommends that you continue on your current medications as directed. Please refer to the Current Medication list given to you today.  I have sent a prescription to the pharmacy for a Rashmi Tallent quote.  Please call your pharmacy to see if Multaq will be affordable for you.  You can let the nurse know when she calls.  * If you need a refill on your cardiac medications before your next appointment, please call your pharmacy.   Labwork: None ordered If you have labs (blood work) drawn today and your tests are completely normal, you will receive your results only by:  [place] (if you have MyChart) OR  A paper copy in the mail If you have any lab test that is abnormal or we need to change your treatment, we will call you to review the results.  Testing/Procedures: Your physician has recommended that you have a Cardioversion (DCCV). Electrical Cardioversion uses a jolt of electricity to your heart either through paddles or wired patches attached to your chest. This is a controlled, usually prescheduled, procedure. Defibrillation is done under light anesthesia in the hospital, and you usually go home the day of the procedure. This is done to get your heart back into a normal rhythm. You are not awake for the procedure. Please see the instructions below.   Follow-Up: Your physician recommends that you schedule a follow-up appointment in: 3 months with Dr. Curt Bears  Thank you for choosing [facility]!!   Trinidad Curet, RN [phone]  Any Other Special Instructions Will Be Listed Below (If Applicable).  CARDIOVERSION INSTRUCTIONS  Please arrive at [facility] on ___________ @ _____________. Do not eat or drink after midnight prior to procedure Hold the following medication the morning of this procedure:    1. Lasix     2. Flomax Take your remaining medications as normal the morning of your procedure with a sip of water. You will need  someone to drive you home at discharge                            Dronedarone tablets What is this medicine? DRONEDARONE (droe NE da rone) is an antiarrhythmic drug. It helps make your heart beat regularly. This medicine may be used for other purposes; ask your health care provider or pharmacist if you have questions. COMMON BRAND NAME(S): Multaq What should I tell my health care provider before I take this medicine? They need to know if you have any of these conditions:  heart failure  history of irregular heartbeat  liver disease  liver or lung problems with the past use of amiodarone  low levels of magnesium in the blood  low levels of potassium in the blood  other heart disease  an unusual or allergic reaction to dronedarone, other medicines, foods, dyes, or preservatives  pregnant or trying to get pregnant  breast-feeding How should I use this medicine? Take this medicine by mouth with a glass of water. Follow the directions on the prescription label. Take one tablet with the morning meal and one tablet with the evening meal. Do not take your medicine more often than directed. Do not stop taking except on the advice of your doctor or health care professional. A special MedGuide will be given to you by the pharmacist with each prescription and refill. Be sure to read this information carefully each time. Talk to your pediatrician regarding the use of this medicine in  children. Special care may be needed. Overdosage: If you think you have taken too much of this medicine contact a poison control center or emergency room at once. NOTE: This medicine is only for you. Do not share this medicine with others. What if I miss a dose? If you miss a dose, take it as soon as you can. If it is almost time for your next dose, take only that dose. Do not take double or extra doses. What may interact with this medicine? Do not take this medicine with any of the following  medications:  arsenic trioxide  certain antibiotics like clarithromycin, erythromycin, pentamidine, telithromycin, troleandomycin  certain medicines for depression like tricyclic antidepressants  certain medicines for fungal infections like fluconazole, itraconazole, ketoconazole, posaconazole, voriconazole  certain medicines for irregular heart beat like amiodarone, disopyramide, flecainide, ibutilide, quinidine, propafenone, sotalol  certain medicines for malaria like chloroquine, halofantrine  cisapride  cyclosporine  droperidol  haloperidol  methadone  other medicines that prolong the QT interval (cause an abnormal heart rhythm)  pimozide  nefazodone  phenothiazines like chlorpromazine, mesoridazine, prochlorperazine, thioridazine  ritonavir  ziprasidone This medicine may also interact with the following medications:  certain medicines for blood pressure, heart disease, or irregular heart beat like diltiazem, metoprolol, propranolol, verapamil  certain medicines for cholesterol like atorvastatin, lovastatin, simvastatin  certain medicines for seizures like carbamazepine, phenobarbital, phenytoin  digoxin  dofetilide  grapefruit juice  rifampin  sirolimus  St. John's Wort  tacrolimus This list may not describe all possible interactions. Give your health care provider a list of all the medicines, herbs, non-prescription drugs, or dietary supplements you use. Also tell them if you smoke, drink alcohol, or use illegal drugs. Some items may interact with your medicine. What should I watch for while using this medicine? Your condition will be monitored closely when you first begin therapy. Often, this drug is first started in a hospital or other monitored health care setting. Once you are on maintenance therapy, visit your doctor or health care professional for regular checks on your progress. Because your condition and use of this medicine carry some risk, it  is a good idea to carry an identification card, necklace or bracelet with details of your condition, medications, and doctor or health care professional. Dennis Bast may get drowsy or dizzy. Do not drive, use machinery, or do anything that needs mental alertness until you know how this medicine affects you. Do not stand or sit up quickly, especially if you are an older patient. This reduces the risk of dizzy or fainting spells. What side effects may I notice from receiving this medicine? Side effects that you should report to your doctor or health care professional as soon as possible:  allergic reactions like skin rash, itching or hives, swelling of the face, lips, or tongue  breathing problems  cough  dark urine  fast, irregular heartbeat  general ill feeling or flu-like symptoms  light-colored stools  loss of appetite, nausea  right upper belly pain  slow heartbeat  stomach pain  swelling of the legs or ankles  unusually weak or tired  weight gain  yellowing of the eyes or skin Side effects that usually do not require medical attention (report to your doctor or health care professional if they continue or are bothersome):  nausea  vomiting  stomach pain This list may not describe all possible side effects. Call your doctor for medical advice about side effects. You may report side effects to FDA at 1-800-FDA-1088. Where should  I keep my medicine? Keep out of the reach of children. Store at room temperature between 15 and 30 degrees C (59 and 86 degrees F). Throw away any unused medicine after the expiration date. NOTE: This sheet is a summary. It may not cover all possible information. If you have questions about this medicine, talk to your doctor, pharmacist, or health care provider.  2020 Elsevier/Gold Standard (2018-03-30 10:43:10)

## 2019-04-26 NOTE — Addendum Note (Signed)
Addended by: Stanton Kidney on: 04/26/2019 12:26 PM   Modules accepted: Orders

## 2019-05-04 ENCOUNTER — Other Ambulatory Visit: Payer: Self-pay

## 2019-05-04 ENCOUNTER — Ambulatory Visit (INDEPENDENT_AMBULATORY_CARE_PROVIDER_SITE_OTHER): Payer: Medicare Other

## 2019-05-04 DIAGNOSIS — I4510 Unspecified right bundle-branch block: Secondary | ICD-10-CM

## 2019-05-04 DIAGNOSIS — I25119 Atherosclerotic heart disease of native coronary artery with unspecified angina pectoris: Secondary | ICD-10-CM

## 2019-05-04 DIAGNOSIS — I110 Hypertensive heart disease with heart failure: Secondary | ICD-10-CM | POA: Diagnosis not present

## 2019-05-04 DIAGNOSIS — R0602 Shortness of breath: Secondary | ICD-10-CM

## 2019-05-04 DIAGNOSIS — E782 Mixed hyperlipidemia: Secondary | ICD-10-CM

## 2019-05-04 DIAGNOSIS — I480 Paroxysmal atrial fibrillation: Secondary | ICD-10-CM

## 2019-05-04 NOTE — Progress Notes (Unsigned)
Complete echocardiogram has been performed.  Jimmy Keyarah Mcroy RDCS, RVT 

## 2019-05-05 ENCOUNTER — Telehealth: Payer: Self-pay | Admitting: *Deleted

## 2019-05-05 NOTE — Telephone Encounter (Signed)
Attempted to reach pt to discuss scheduling DCCV. Unable to leave a message on home/cell number. Will attempt to reach pt again at later time/date

## 2019-05-07 ENCOUNTER — Other Ambulatory Visit: Payer: Self-pay | Admitting: Cardiology

## 2019-05-12 NOTE — Telephone Encounter (Signed)
Attempted to reach pt again to arrange DCCV. Tried both numbers on file, no answer, no voicemail, unable to leave message.

## 2019-05-14 ENCOUNTER — Encounter: Payer: Self-pay | Admitting: *Deleted

## 2019-05-14 NOTE — Telephone Encounter (Signed)
Dr. Curt Bears made aware unable to reach pt.  Will mail letter to pt asking him to call the office to discuss. Will forward to his general cardiologist for their [facility]

## 2019-05-14 NOTE — Telephone Encounter (Signed)
Pt seen by Dr. Curt Bears on 1/4 for AFib. Plan for DCCV. Have attempted to reach pt on multiple occassions to arrange recommended DCCV.  No answer, no voicemail on either number on file.  Also attempted to reach [facility] on file, his spouse, but again no answer/no voicemail. Will forward to Dr. Curt Bears for recommendation.

## 2019-05-19 ENCOUNTER — Other Ambulatory Visit: Payer: Self-pay | Admitting: Cardiology

## 2019-05-20 ENCOUNTER — Telehealth: Payer: Self-pay | Admitting: Cardiology

## 2019-05-20 NOTE — Telephone Encounter (Signed)
Patient came into office stating that he never received a call about a referral to an ENT.  I reviewed the chart and saw that Dr. Bettina Gavia referred to Dr. Gaylyn Cheers.  I called the office and scheduled the appointment for the patient for 06/11/2019.  Patient was informed of appointment and confirmed date and time.

## 2019-05-20 NOTE — Telephone Encounter (Signed)
New Message   Patient is wanting a call from [employer]. When I asked what the call was in reference to he said "Maybe I just want to talk to her and ask her shoe size". Please call patient.

## 2019-06-03 NOTE — Telephone Encounter (Signed)
Followed up w/ pt again to see how he is doing. I mentioned Multaq/DCCV.  Pt reports he is doing fine, states he "has been in AFib for years now and doesn't understand why we need to do anything now". He reports that Multaq will cost $290/mo and he is already having to pay over $400/mo for his Xarelto.  Advised pt that Xarelto should reduce in [place] once he reaches his Medicare deductible, but he states he already has met it.  Advised pt to call Medicare to further discuss this as he should not be paying that much once deductible is met.  Pt agreed to call and speak w/ them about this matter.  Advised to keep appt w/ Dr. Bettina Gavia next week to further discuss treatment plan. Informed that they can schedule DCCV if he and Dr Bettina Gavia feel it is best thing. Informed that I will forward this to Dr. Bettina Gavia for his FYI. Pt informed that I will not cancel f/u appt w/ [place] in March and can determine if he needs appt when he sees [facility] next week.  Patient verbalized understanding and agreeable to plan.

## 2019-06-04 ENCOUNTER — Other Ambulatory Visit: Payer: Self-pay | Admitting: Cardiology

## 2019-06-08 ENCOUNTER — Other Ambulatory Visit: Payer: Self-pay | Admitting: Cardiology

## 2019-06-10 ENCOUNTER — Encounter: Payer: Self-pay | Admitting: Cardiology

## 2019-06-10 ENCOUNTER — Telehealth (INDEPENDENT_AMBULATORY_CARE_PROVIDER_SITE_OTHER): Payer: Medicare Other | Admitting: Cardiology

## 2019-06-10 ENCOUNTER — Other Ambulatory Visit: Payer: Self-pay

## 2019-06-10 DIAGNOSIS — I25119 Atherosclerotic heart disease of native coronary artery with unspecified angina pectoris: Secondary | ICD-10-CM

## 2019-06-10 DIAGNOSIS — I110 Hypertensive heart disease with heart failure: Secondary | ICD-10-CM

## 2019-06-10 DIAGNOSIS — Z7901 Long term (current) use of anticoagulants: Secondary | ICD-10-CM

## 2019-06-10 DIAGNOSIS — E782 Mixed hyperlipidemia: Secondary | ICD-10-CM

## 2019-06-10 DIAGNOSIS — I484 Atypical atrial flutter: Secondary | ICD-10-CM | POA: Diagnosis not present

## 2019-06-10 NOTE — Addendum Note (Signed)
Addended by: Claude Manges on: 06/10/2019 03:09 PM   Modules accepted: Orders

## 2019-06-10 NOTE — Patient Instructions (Signed)
Medication Instructions:    Your physician recommends that you continue on your current medications as directed. Please refer to the Current Medication list given to you today.  *If you need a refill on your cardiac medications before your next appointment, please call your pharmacy*  Lab Work:   [place]  If you have labs (blood work) drawn today and your tests are completely normal, you will receive your results only by: Marland Kitchen MyChart Message (if you have MyChart) OR . A paper copy in the mail If you have any lab test that is abnormal or we need to change your treatment, we will call you to review the results.  Testing/Procedures: NONE ORDERED  TODAY   Follow-Up:  At [facility], you and your health needs are our priority.  As part of our continuing mission to provide you with exceptional heart care, we have created designated Provider Care Teams.  These Care Teams include your primary Cardiologist (physician) and Advanced Practice Providers (APPs -  Physician Assistants and Nurse Practitioners) who all work together to provide you with the care you need, when you need it.  Your next appointment: You will come up to office early in next  week for EKG and labs to be set up for an cardioversion. And follow up    The format for your next appointment:  NONE ORDERED  TODAY  Other Instructions

## 2019-06-10 NOTE — Progress Notes (Signed)
Virtual Visit via Telephone Note   This visit type was conducted due to national recommendations for restrictions regarding the COVID-19 Pandemic (e.g. social distancing) in an effort to limit this patient's exposure and mitigate transmission in our community.  Due to his co-morbid illnesses, this patient is at least at moderate risk for complications without adequate follow up.  This format is felt to be most appropriate for this patient at this time.  The patient did not have access to video technology/had technical difficulties with video requiring transitioning to audio format only (telephone).  All issues noted in this document were discussed and addressed.  No physical exam could be performed with this format.  Please refer to the patient's chart for his  consent to telehealth for [facility].  It was a real area I coached him in which he is could not obtain a video link despite using the [facility] heartbeat in addition.  Unfortunately he does not have any device to check heart rate and blood pressure at home.  Duration of visit 18 minutes  Date:  06/10/2019   ID:  [name]., DOB 1941-03-17, MRN [id number]  PCP:  Practice, [place]  Cardiologist:  Shirlee More, MD    Referring MD: Practice, Darel Hong*    ASSESSMENT:    1. Atypical atrial flutter ([place])   2. Chronic anticoagulation   3. Coronary artery disease involving native coronary artery of native heart with angina pectoris ([place])   4. Hypertensive heart disease with heart failure ([place])   5. Mixed hyperlipidemia    PLAN:    In order of problems listed above:  1. Remains symptomatic from his atrial arrhythmia and underlying heart disease we will check labs EKG and plan outpatient cardioversion and afterwards we will utilize sotalol  2. Continue his anticoagulant 3. Stable CAD medical therapy 4. He is to be seen in the office check labs including proBNP 5. Continue lipid-lowering therapy   Next  appointment: Office 3 months   Medication Adjustments/Labs and Tests Ordered: Current medicines are reviewed at length with the patient today.  Concerns regarding medicines are outlined above.  No orders of the defined types were placed in this encounter.  No orders of the defined types were placed in this encounter.   Chief Complaint  Patient presents with  . Follow-up  . Atrial Flutter    History of Present Illness:    Oakley Coggins. is a 79 y.o. male with a hx of  CAD, NonSTEMI 04/22/16  with DES to [facility] for unstable angina 10/17/14 , PCI / Resolute Drug Eluting Stent of the proximal Left Anterior Descending Coronary Artery and PTCA of the ostial 1st Diagonal Coronary Artery on 04/23/16 Dyslipidemia, Hypertensive heart disease with heart disease, and atypical atrial flutter last seen 05/08/2018.  Is found to be in recurrent atrial fibrillation and had exercise intolerance and exertional shortness of breath.  He has a background history of abnormal liver function test and tolerance of amiodarone.  I referred him to EP for consultation and after consideration of EP catheter ablation of cardioversion elected to remain in atrial fibrillation. Compliance with diet, lifestyle and medications: Yes  He is frustrated is having trouble with the cost of his anticoagulant and continues to have limitations in his ability related to atrial flutter.  We discussed the potential for cardioversion he wants to do it and ask if I could take an antiarrhythmic drug that would not cause him  financial toxicity.  I will have him come to my office next week we will do an EKG check labs and then we will set him up for cardioversion at [facility].  He is not having edema orthopnea chest pain or syncope tolerates his anticoagulant without bleeding complication. Past Medical History:  Diagnosis Date  . Anxiety   . Arthritis   . Atypical atrial flutter ([place]) 12/14/2014  . Bradycardia, sinus 10/15/2014  . Burning  sensation of feet 01/17/2016  . CAD, multiple vessel 10/18/2014   Overview:  DES to [facility] for unstable angina 10/17/14 Diagnostic Summary Severe stenosis of mid Cx likely cause of recent accelerating angina Diffuse severe stenosis of small caliber LAD unchanged from 2012 Normal LV function Paroxysmal SVT noted during procedure 04/23/16:  PCI / Resolute Drug Eluting Stent of the proximal Left Anterior Descending Coronary Artery. Successful PTCA of the ostial 1st Diagonal Coronary Artery  . 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 Coronary artery disease 2003   s/p MI and numerous stents  . Depression   . Enlarged prostate without lower urinary tract symptoms (luts) 2012   frequent urination and nocturia; sees dr [name] Late in [place]  . Essential hypertension 10/15/2014  . Gastroesophageal reflux disease without esophagitis 04/21/2016  . GERD (gastroesophageal reflux disease)   . Hyperlipidemia   . Hypertension   . IHD (ischemic heart disease) 10/15/2014  . Lumbar radiculopathy 02/21/2016  . Myocardial infarction ([place]) 2003  . NSTEMI (non-ST elevated myocardial infarction) ([place]) 04/24/2016   Overview:  LHC 04/24/15: Severe stenosis of the LAD & Diagonal bifurcation  LV ejection fraction is 55-60 %  Interventional Summary Successful Angiosculpt PCI / Resolute Drug Eluting Stent of the proximal LAD Successful PTCA of the ostial 1st Diagonal Coronary Artery  . Pain in both feet 01/17/2016  . Paroxysmal SVT (supraventricular tachycardia) ([place]) 11/16/2014  . Precordial pain 10/15/2014  . PVC's (premature ventricular contractions) 04/22/2016  . RBBB 10/15/2014  . Sleep disturbances 01/17/2016  . Snoring 01/17/2016  . Subscapularis (muscle) sprain 03/29/2011  . 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 CARDIAC CATHETERIZATION  10-2010   ptca/stent in [place]  . CARDIAC CATHETERIZATION  04/23/2016  . CORONARY ANGIOPLASTY      . EYE SURGERY  05-1010   bil cataract ,iol  . FRACTURE SURGERY    . I & D EXTREMITY Right 02/23/2015   Procedure: RIGHT RING FINGER REVISION AMPUTATION;  Surgeon: Leanora Cover, MD;  Location: [place];  Service: Orthopedics;  Laterality: Right;  . KNEE ARTHROSCOPY W/ MENISCAL REPAIR  2000   left knee  . LUMBAR EPIDURAL INJECTION  2011   low back pain; DDD  . ring finger tramatic qamputation Right   . SHOULDER ARTHROSCOPY  03/29/2011   Procedure: ARTHROSCOPY SHOULDER;  Surgeon: Augustin Schooling;  Location: [place];  Service: Orthopedics;  Laterality: Right;  RIGHT SHOULDER ARTHROSCOPY WITH OPEN SUBSCAPULAR REPAIR  . SHOULDER OPEN ROTATOR CUFF REPAIR     3 on rt shoulder; 2 on left  . TRANSFORAMINAL LUMBAR INTERBODY FUSION (TLIF) WITH PEDICLE SCREW FIXATION 1 LEVEL N/A 04/24/2017   Procedure: TRANSFORAMINAL LUMBAR INTERBODY FUSION (TLIF) L4-5;  Surgeon: Melina Schools, MD;  Location: [place];  Service: Orthopedics;  Laterality: N/A;  4 hrs    Current Medications: No outpatient medications have been marked as taking for the 06/10/19 encounter (Telemedicine) with Richardo Priest, MD.     Allergies:  Buprenorphine hcl, Codeine, Hydrocodone, and Percocet [oxycodone-acetaminophen]   Social History   Socioeconomic History  . Marital status: Married    Spouse name: Not on file  . Number of children: Not on file  . Years of education: Not on file  . Highest education level: Not on file  Occupational History  . Not on file  Tobacco Use  . Smoking status: Former Smoker    Packs/day: 1.00    Years: 20.00    Pack years: 20.00    Types: Cigarettes    Quit date: 03/20/1994    Years since quitting: 25.2  . Smokeless tobacco: Former Systems developer    Types: [place] date: 03/20/1996  Substance and Sexual Activity  . Alcohol use: Yes    Alcohol/week: 6.0 standard drinks    Types: 6 Cans of beer per week  . Drug use: No  . Sexual activity: Not on file  Other Topics Concern  . Not on file  Social  History Narrative  . Not on file   Social Determinants of Health   Financial Resource Strain:   . Difficulty of Paying Living Expenses: Not on file  Food Insecurity:   . Worried About Charity fundraiser in the Last Year: Not on file  . Ran Out of Food in the Last Year: Not on file  Transportation Needs:   . Lack of Transportation (Medical): Not on file  . Lack of Transportation (Non-Medical): Not on file  Physical Activity:   . Days of Exercise per Week: Not on file  . Minutes of Exercise per Session: Not on file  Stress:   . Feeling of Stress : Not on file  Social Connections:   . Frequency of Communication with Friends and Family: Not on file  . Frequency of Social Gatherings with Friends and Family: Not on file  . Attends Religious Services: Not on file  . Active Member of Clubs or Organizations: Not on file  . Attends Archivist Meetings: Not on file  . Marital Status: Not on file     Family History: The patient's family history includes Cancer in his father and sister; Heart disease in his father and mother. ROS:   Please see the history of present illness.    All other systems reviewed and are negative.  EKGs/Labs/Other Studies Reviewed:    The following studies were reviewed today:  EKG:  EKG performed at the time of EP consultation 04/26/2019 demonstrates rapid atrial fibrillation rate approximately 115 bpm right bundle branch block  Recent Labs: 03/09/2019 from his PCP CMP normal creatinine 1.17 potassium 4.2 liver function test normal hemoglobin 14.4 Recent Lipid Panel    Component Value Date/Time   CHOL 152 04/08/2018 0921   TRIG 198 (H) 04/08/2018 0921   HDL 48 04/08/2018 0921   CHOLHDL 3.2 04/08/2018 0921   LDLCALC 64 04/08/2018 0921    Physical Exam:    VS:  There were no vitals taken for this visit.    Wt Readings from Last 3 Encounters:  04/26/19 223 lb 6.4 oz (101.3 kg)  03/25/19 223 lb (101.2 kg)  03/09/19 223 lb 3.2 oz (101.2  kg)        Signed, Shirlee More, MD  06/10/2019 1:48 PM    [place]

## 2019-06-11 ENCOUNTER — Other Ambulatory Visit: Payer: Self-pay

## 2019-06-11 MED ORDER — EZETIMIBE 10 MG PO TABS: 10.0000 mg | ORAL_TABLET | Freq: Every evening | ORAL | 3 refills | Status: DC

## 2019-06-17 ENCOUNTER — Other Ambulatory Visit: Payer: Self-pay | Admitting: Cardiology

## 2019-07-18 ENCOUNTER — Other Ambulatory Visit: Payer: Self-pay | Admitting: Cardiology

## 2019-07-25 NOTE — Progress Notes (Signed)
Electrophysiology Office Note   Date:  07/26/2019   ID:  Andrew Hardy., DOB [birth year], MRN [id number]  PCP:  [facility]  Cardiologist:  Bettina Gavia Primary Electrophysiologist:  Hong Moring Meredith Leeds, MD    Chief Complaint: AF [name] 1 month History of Present Illness: Andrew Hardy. is a 79 y.o. male who is being seen today for the evaluation of AF at the request of Practice, Darel Hong*. Presenting today for electrophysiology evaluation.  He has a history significant for coronary artery disease with multiple stents, atypical atrial flutter, atrial fibrillation, hyperlipidemia, hypertension.  He has been on amiodarone in the past though had abnormal LFTs.   Today, denies symptoms of palpitations, chest pain, shortness of breath, orthopnea, PND, lower extremity edema, claudication, dizziness, presyncope, syncope, bleeding, or neurologic sequela. The patient is tolerating medications without difficulties.  He does continue to have shortness of breath.  He remains in atrial fibrillation.  His heart rates are in the 120s.  He is quite frustrated because he has been in atrial fibrillation for a few months and has not had a cardioversion arranged.   Past Medical History:  Diagnosis Date  . Anxiety   . Arthritis   . Atypical atrial flutter ([name]) 12/14/2014  . Bradycardia, sinus 10/15/2014  . Burning sensation of feet 01/17/2016  . CAD, multiple vessel 10/18/2014   Overview:  DES to [facility] for unstable angina 10/17/14 Diagnostic Summary Severe stenosis of mid Cx likely cause of recent accelerating angina Diffuse severe stenosis of small caliber LAD unchanged from 2012 Normal LV function Paroxysmal SVT noted during procedure 04/23/16:  PCI / Resolute Drug Eluting Stent of the proximal Left Anterior Descending Coronary Artery. Successful PTCA of the ostial 1st Diagonal Coronary Artery  . 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 Coronary artery disease 2003    s/p MI and numerous stents  . Depression   . Enlarged prostate without lower urinary tract symptoms (luts) 2012   frequent urination and nocturia; sees dr [name] Late in [place]  . Essential hypertension 10/15/2014  . Gastroesophageal reflux disease without esophagitis 04/21/2016  . GERD (gastroesophageal reflux disease)   . Hyperlipidemia   . Hypertension   . IHD (ischemic heart disease) 10/15/2014  . Lumbar radiculopathy 02/21/2016  . Myocardial infarction ([place]) 2003  . NSTEMI (non-ST elevated myocardial infarction) ([name]) 04/24/2016   Overview:  LHC 04/24/15: Severe stenosis of the LAD & Diagonal bifurcation  LV ejection fraction is 55-60 %  Interventional Summary Successful Angiosculpt PCI / Resolute Drug Eluting Stent of the proximal LAD Successful PTCA of the ostial 1st Diagonal Coronary Artery  . Pain in both feet 01/17/2016  . Paroxysmal SVT (supraventricular tachycardia) ([place]) 11/16/2014  . Precordial pain 10/15/2014  . PVC's (premature ventricular contractions) 04/22/2016  . RBBB 10/15/2014  . Sleep disturbances 01/17/2016  . Snoring 01/17/2016  . Subscapularis (muscle) sprain 03/29/2011  . 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 CARDIAC CATHETERIZATION  10-2010   ptca/stent in [place]  . CARDIAC CATHETERIZATION  04/23/2016  . CORONARY ANGIOPLASTY    . EYE SURGERY  05-1010   bil cataract ,iol  . FRACTURE SURGERY    . I & D EXTREMITY Right 02/23/2015   Procedure: RIGHT RING FINGER REVISION AMPUTATION;  Surgeon: Leanora Cover, MD;  Location: [place];  Service: Orthopedics;  Laterality: Right;  . KNEE ARTHROSCOPY W/ MENISCAL REPAIR  2000   left  knee  . LUMBAR EPIDURAL INJECTION  2011   low back pain; DDD  . ring finger tramatic qamputation Right   . SHOULDER ARTHROSCOPY  03/29/2011   Procedure: ARTHROSCOPY SHOULDER;  Surgeon: Augustin Schooling;  Location: [place];  Service: Orthopedics;  Laterality: Right;  RIGHT SHOULDER  ARTHROSCOPY WITH OPEN SUBSCAPULAR REPAIR  . SHOULDER OPEN ROTATOR CUFF REPAIR     3 on rt shoulder; 2 on left  . TRANSFORAMINAL LUMBAR INTERBODY FUSION (TLIF) WITH PEDICLE SCREW FIXATION 1 LEVEL N/A 04/24/2017   Procedure: TRANSFORAMINAL LUMBAR INTERBODY FUSION (TLIF) L4-5;  Surgeon: Melina Schools, MD;  Location: [place];  Service: Orthopedics;  Laterality: N/A;  4 hrs     Current Outpatient Medications  Medication Sig Dispense Refill  . acetaminophen (TYLENOL) 500 MG tablet Take 1,000 mg by mouth at bedtime as needed.     . clopidogrel (PLAVIX) 75 MG tablet Take 75 mg by mouth daily.    Marland Kitchen Co-Enzyme Q-10 30 MG CAPS Take 100 mg by mouth daily.    . Cyanocobalamin (B-12 PO) Take 1 tablet by mouth daily.    . DULoxetine (CYMBALTA) 30 MG capsule Take 30 mg by mouth 2 (two) times daily.    Marland Kitchen EPINEPHrine (EPIPEN 2-PAK) 0.3 mg/0.3 mL IJ SOAJ injection     . esomeprazole (NEXIUM) 40 MG capsule TAKE 1 CAPSULE BY MOUTH DAILY BEFORE BREAKFAST 90 capsule 0  . ezetimibe (ZETIA) 10 MG tablet Take 1 tablet (10 mg total) by mouth every evening. 90 tablet 3  . finasteride (PROSCAR) 5 MG tablet Take 5 mg by mouth daily.    . Flaxseed, Linseed, (FLAXSEED OIL MAX STR) 1300 MG CAPS Take 1,300 mg by mouth daily.    . furosemide (LASIX) 80 MG tablet ALTERNATE 1/2 TABLET BY MOUTH EVERY DAILY WITH 1 TABLET THE OTHER DAY 90 tablet 0  . gemfibrozil (LOPID) 600 MG tablet TAKE 1/2 TABLET BY MOUTH TWICE DAILY BEFORE A MEAL 90 tablet 0  . Glucosamine HCl (GLUCOSAMINE PO) Take 1 tablet by mouth daily.    . isosorbide mononitrate (IMDUR) 60 MG 24 hr tablet Take 1 tablet (60 mg total) by mouth daily. 90 tablet 0  . metoprolol succinate (TOPROL-XL) 25 MG 24 hr tablet TAKE 1 TABLET BY MOUTH EVERY DAY. TAKE WITH OR IMMEDIATELY FOLLOWING A MEAL 90 tablet 0  . minocycline (MINOCIN,DYNACIN) 50 MG capsule TAKE 1 CAPSULE BY MOUTH QD  2  . Misc Natural Products (PUMPKIN SEED OIL) CAPS Take 1 capsule by mouth daily.    . Multiple  Vitamins-Minerals (MULTIVITAMINS THER. W/MINERALS) TABS Take 1 tablet by mouth daily.      . nitroGLYCERIN (NITROSTAT) 0.4 MG SL tablet Place 1 tablet (0.4 mg total) under the tongue every 5 (five) minutes as needed for chest pain. TAKE 1 TABLET UNDER THE TONGUE AS NEEDED AS DIRECTED FOR CHEST PAIN. 25 tablet 3  . Omega-3 Fatty Acids (FISH OIL PO) Take 1 tablet by mouth daily.    . potassium chloride SA (KLOR-CON) 20 MEQ tablet TAKE 1 TABLET BY MOUTH TWICE DAILY 180 tablet 1  . rivaroxaban (XARELTO) 20 MG TABS tablet Take 1 tablet (20 mg total) by mouth daily with supper. 30 tablet 3  . simvastatin (ZOCOR) 40 MG tablet TAKE 1 TABLET(40 MG) BY MOUTH AT BEDTIME 90 tablet 0  . tamsulosin (FLOMAX) 0.4 MG CAPS capsule Take 0.4 mg by mouth daily.    . traZODone (DESYREL) 100 MG tablet Take 100 mg by mouth at bedtime.    Marland Kitchen  vitamin E 400 UNIT capsule Take 400 Units by mouth daily.    . sotalol (BETAPACE) 80 MG tablet Take 0.5 tablets (40 mg total) by mouth 2 (two) times daily. 30 tablet 3   No current facility-administered medications for this visit.    Allergies:   Buprenorphine hcl, Codeine, Hydrocodone, and Percocet [oxycodone-acetaminophen]   Social History:  The patient  reports that he quit smoking about 25 years ago. His smoking use included cigarettes. He has a 20.00 pack-year smoking history. He quit smokeless tobacco use about 23 years ago.  His smokeless tobacco use included chew. He reports current alcohol use of about 6.0 standard drinks of alcohol per week. He reports that he does not use drugs.   Family History:  The patient's family history includes Cancer in his father and sister; Heart disease in his father and mother.    ROS:  Please see the history of present illness.   Otherwise, review of systems is positive for none.   All other systems are reviewed and negative.   PHYSICAL EXAM: VS:  BP 136/86   Pulse (!) 133   Ht 5\' 7"  (1.702 m)   Wt 227 lb (103 kg)   SpO2 96%   BMI  35.55 kg/m  , BMI Body mass index is 35.55 kg/m. GEN: Well nourished, well developed, in no acute distress  HEENT: normal  Neck: no JVD, carotid bruits, or masses Cardiac: Irregular, tachycardic; no murmurs, rubs, or gallops,no edema  Respiratory:  clear to auscultation bilaterally, normal work of breathing GI: soft, nontender, nondistended, + BS MS: no deformity or atrophy  Skin: warm and dry Neuro:  Strength and sensation are intact Psych: euthymic mood, full affect  EKG:  EKG is ordered today. Personal review of the ekg ordered shows atrial fibrillation, rate 120, right bundle branch block   Recent Labs: No results found for requested labs within last 8760 hours.    Lipid Panel     Component Value Date/Time   CHOL 152 04/08/2018 0921   TRIG 198 (H) 04/08/2018 0921   HDL 48 04/08/2018 0921   CHOLHDL 3.2 04/08/2018 0921   LDLCALC 64 04/08/2018 0921     Wt Readings from Last 3 Encounters:  07/26/19 227 lb (103 kg)  04/26/19 223 lb 6.4 oz (101.3 kg)  03/25/19 223 lb (101.2 kg)      Other studies Reviewed: Additional studies/ records that were reviewed today include: SPECT 03/25/19  Review of the above records today demonstrates:   There was no ST segment deviation noted during stress.  No T wave inversion was noted during stress.  Nongated study due to atrial fibrillation.  The study is normal.  This is a low risk study.   TTE 05/04/2019 1. Left ventricular ejection fraction, by visual estimation, is 45 to  50%. There is mildly increased left ventricular hypertrophy.  2. Left atrial size was normal.  3. Right atrial size was normal.  4. The mitral valve is normal in structure. No evidence of mitral valve  regurgitation. No evidence of mitral stenosis.  5. The tricuspid valve is normal in structure.  6. The aortic valve is normal in structure. Aortic valve regurgitation is  trivial. No evidence of aortic valve sclerosis or stenosis.  7. The pulmonic  valve was normal in structure. Pulmonic valve  regurgitation is not visualized.  8. Mildly elevated pulmonary artery systolic pressure.  9. The inferior vena cava is normal in size with greater than 50%  respiratory variability, suggesting  right atrial pressure of 3 mmHg.    ASSESSMENT AND PLAN:  1.  Persistent atrial fibrillation: Currently on Xarelto.  CHA2DS2-VASc of 4.  Unfortunately he remains in atrial fibrillation today.  He has a right bundle branch block.  His atrial fibrillation rate is quite fast.  I am concerned of a drop in his ejection fraction if he remains in RVR.  He would benefit from cardioversion.  We Posey Petrik also plan to start low-dose sotalol and titrate up as an outpatient.  2.  Coronary artery disease: No current chest pain  3.  Hypertension: Overall well controlled  4.  Hyperlipidemia: Plan per primary cardiology  Case discussed with primary cardiology  Current medicines are reviewed at length with the patient today.   The patient does not have concerns regarding his medicines.  The following changes were made today: Sotalol  Labs/ tests ordered today include:  Orders Placed This Encounter  Procedures  . Basic metabolic panel  . CBC  . EKG 12-Lead    Disposition:   FU with Kameka Whan 3 months  Signed, Tyquarius Paglia Meredith Leeds, MD  07/26/2019 9:43 AM     [facility] [address] [place] [address] [phone] (office) [phone] (fax)

## 2019-07-26 ENCOUNTER — Ambulatory Visit (INDEPENDENT_AMBULATORY_CARE_PROVIDER_SITE_OTHER): Payer: Medicare Other | Admitting: Cardiology

## 2019-07-26 ENCOUNTER — Other Ambulatory Visit: Payer: Self-pay

## 2019-07-26 ENCOUNTER — Ambulatory Visit: Payer: Medicare Other | Admitting: Cardiology

## 2019-07-26 ENCOUNTER — Encounter: Payer: Self-pay | Admitting: Cardiology

## 2019-07-26 VITALS — BP 136/86 | HR 133 | Ht 67.0 in | Wt 227.0 lb

## 2019-07-26 DIAGNOSIS — I25119 Atherosclerotic heart disease of native coronary artery with unspecified angina pectoris: Secondary | ICD-10-CM

## 2019-07-26 DIAGNOSIS — I4819 Other persistent atrial fibrillation: Secondary | ICD-10-CM | POA: Diagnosis not present

## 2019-07-26 DIAGNOSIS — Z01812 Encounter for preprocedural laboratory examination: Secondary | ICD-10-CM | POA: Diagnosis not present

## 2019-07-26 MED ORDER — SOTALOL HCL 80 MG PO TABS: 40.0000 mg | ORAL_TABLET | Freq: Two times a day (BID) | ORAL | 3 refills | Status: DC

## 2019-07-26 NOTE — Patient Instructions (Addendum)
Medication Instructions:  Your physician has recommended you make the following change in your medication:  1. START Sotalol 40 mg twice a day -- YOU WILL START THIS MEDICATION 2 DAYS PRIOR TO YOUR CARDIOVERSION  *If you need a refill on your cardiac medications before your next appointment, please call your pharmacy*   Lab Work: Pre procedure blood work today: BMET & CBC If you have labs (blood work) drawn today and your tests are completely normal, you will receive your results only by: Marland Kitchen MyChart Message (if you have MyChart) OR . A paper copy in the mail If you have any lab test that is abnormal or we need to change your treatment, we will call you to review the results.   Testing/Procedures: Your physician has recommended that you have a Cardioversion (DCCV). Electrical Cardioversion uses a jolt of electricity to your heart either through paddles or wired patches attached to your chest. This is a controlled, usually prescheduled, procedure. Defibrillation is done under light anesthesia in the hospital, and you usually go home the day of the procedure. This is done to get your heart back into a normal rhythm. You are not awake for the procedure. Please see the instructions below.   Follow-Up: Your physician recommends that you schedule a follow-up appointment in: one week after your cardioversion for an EKG. (we will arrange this after cardioversion has been scheduled)  At [facility], you and your health needs are our priority.  As part of our continuing mission to provide you with exceptional heart care, we have created designated Provider Care Teams.  These Care Teams include your primary Cardiologist (physician) and Advanced Practice Providers (APPs -  Physician Assistants and Nurse Practitioners) who all work together to provide you with the care you need, when you need it.  We recommend signing up for the patient portal called "MyChart".  Sign up information is provided on this  After Visit Summary.  MyChart is used to connect with patients for Virtual Visits (Telemedicine).  Patients are able to view lab/test results, encounter notes, upcoming appointments, etc.  Non-urgent messages can be sent to your provider as well.   To learn more about what you can do with MyChart, go to [email or web address].    Your next appointment:   3 month(s)  The format for your next appointment:   In Person  Provider:   Allegra Lai, MD   Thank you for choosing [facility]!!   Trinidad Curet, RN [phone]    Other Instructions The nurse will call you to arrange this once we have gotten approval from insurance.   You will need to be COVID screened within 1 week PRIOR to this cardioversion.  You may stop by any urgent care in [place] to have this completed.  Once you have received the result, please drop a copy off to the [place] office.  You are scheduled for a Cardioversion on __________ with Dr. _________.  Please arrive at [place]: Nothing to eat or drink after midnight except a sip of water with medications (see medication instructions below)  Medication Instructions: Continue your anticoagulant: Xarelto You will need to continue your anticoagulant after your procedure until you are told by your provider that it is safe to stop   You must have a responsible person to drive you home and stay in the waiting area during your procedure. Failure to do so could result in cancellation.  Bring your insurance cards.  *Special Note: Every effort is  made to have your procedure done on time. Occasionally there are emergencies that occur at the hospital that may cause delays. Please be patient if a delay does occur.

## 2019-07-27 ENCOUNTER — Other Ambulatory Visit: Payer: Self-pay | Admitting: Legal Medicine

## 2019-07-27 LAB — CBC
Hematocrit: 43.2 % (ref 37.5–51.0)
Hemoglobin: 14.3 g/dL (ref 13.0–17.7)
MCH: 31.2 pg (ref 26.6–33.0)
MCHC: 33.1 g/dL (ref 31.5–35.7)
MCV: 94 fL (ref 79–97)
Platelets: 221 10*3/uL (ref 150–450)
RBC: 4.59 x10E6/uL (ref 4.14–5.80)
RDW: 13.1 % (ref 11.6–15.4)
WBC: 6.1 10*3/uL (ref 3.4–10.8)

## 2019-07-27 LAB — BASIC METABOLIC PANEL
BUN/Creatinine Ratio: 15 (ref 10–24)
BUN: 16 mg/dL (ref 8–27)
CO2: 24 mmol/L (ref 20–29)
Calcium: 9.8 mg/dL (ref 8.6–10.2)
Chloride: 100 mmol/L (ref 96–106)
Creatinine, Ser: 1.05 mg/dL (ref 0.76–1.27)
GFR calc Af Amer: 79 mL/min/{1.73_m2} (ref >59)
GFR calc non Af Amer: 68 mL/min/{1.73_m2} (ref >59)
Glucose: 72 mg/dL (ref 65–99)
Potassium: 4.6 mmol/L (ref 3.5–5.2)
Sodium: 145 mmol/L — ABNORMAL HIGH (ref 134–144)

## 2019-08-04 ENCOUNTER — Telehealth: Payer: Self-pay | Admitting: *Deleted

## 2019-08-04 NOTE — Telephone Encounter (Signed)
Pt aware I tried to call [facility] to schedule DCCV for next week.  Informed that I have to wait until tomorrow to call, the OR scheduler is not in today. Aware I will call once date is arranged for DCCV

## 2019-08-06 NOTE — Telephone Encounter (Signed)
DCCV scheduled for 4/20 at [facility]. Pt aware to start Sotalol 40 mg BID on Sunday. Pt will stop by the [place] office Monday to drop off covid result (negative) and then go to hospital for CXR. Aware to arrive to [facility] at 6:45 am on Tuesday 4/20 for DCCV.  Instructions reviewed. Aware Dr. Joya Gaskins office will call to arrange f/u EKG about a week after. Patient verbalized understanding and agreeable to plan.

## 2019-08-09 ENCOUNTER — Telehealth: Payer: Self-pay | Admitting: Cardiology

## 2019-08-09 NOTE — Telephone Encounter (Signed)
Santiago Glad from the [place] at [facility] calling stating they have not received the patient covid test results for his cardioversion tomorrow 08/10/2019 at 8 am.

## 2019-08-09 NOTE — Telephone Encounter (Signed)
Spoke to [facility] OR/scheduling.  Informed that negative covid screening and pre procedure lab work faxed just a few minutes ago.

## 2019-08-10 DIAGNOSIS — I4891 Unspecified atrial fibrillation: Secondary | ICD-10-CM

## 2019-08-12 ENCOUNTER — Other Ambulatory Visit: Payer: Self-pay | Admitting: Cardiology

## 2019-08-17 ENCOUNTER — Other Ambulatory Visit: Payer: Self-pay | Admitting: Cardiology

## 2019-08-26 ENCOUNTER — Other Ambulatory Visit: Payer: Self-pay

## 2019-08-26 NOTE — Progress Notes (Signed)
Cardiology Office Note:    Date:  08/27/2019   ID:  Andrew Hardy., DOB 04-10-1941, MRN [id number]  PCP:  [facility]  Cardiologist:  Shirlee More, MD    Referring MD: Practice, D[facility] Hong*    ASSESSMENT:    1. Persistent atrial fibrillation ([place])   2. Chronic anticoagulation   3. Hypertensive heart disease with heart failure ([place])   4. Coronary artery disease involving native coronary artery of native heart with angina pectoris ([place])   5. Mixed hyperlipidemia    PLAN:    In order of problems listed above:  1. Unfortunately is intolerant of amiodarone he is back in atrial fibrillation he prefers rate control and anticoagulation I will see him back in 3 months to reassess his course stop his antiarrhythmic drug and increase beta-blocker 2. BP at target continue current multidrug regimen no evidence of fluid overload at this time and continue his current loop diuretic 3. Stable CAD [place] Heart Association class I continue current medical treatment including combined antiplatelet anticoagulant 4. Continue current treatment including statin fish oil and gemfibrozil   Next appointment: 3 months   Medication Adjustments/Labs and Tests Ordered: Current medicines are reviewed at length with the patient today.  Concerns regarding medicines are outlined above.  No orders of the defined types were placed in this encounter.  No orders of the defined types were placed in this encounter.   No chief complaint on file.   History of Present Illness:    Andrew Hardy. is a 79 y.o. male with a hx of CAD, NonSTEMI 04/22/16  with DES to [facility] for unstable angina 10/17/14 , PCI / Resolute Drug Eluting Stent of the proximal Left Anterior Descending Coronary Artery and PTCA of the ostial 1st Diagonal Coronary Artery on 04/23/16 Dyslipidemia, Hypertensive heart disease with heart disease, and atypical atrial flutter  last seen by me at the time of cardioversion.  He had a  recent successful outpatient cardioversion atrial flutter to sinus rhythm at [facility]. Compliance with diet, lifestyle and medications: Yes  He is aware he is back in atrial fibrillation does not bother him he does is forming he has no exercise intolerance dyspnea chest pain shortness of breath I think it is time for [place] to accept chronic atrial fibrillation withdrawal on antiarrhythmic drug sotalol controlled rate increase beta-blocker continue his combined antithrombotic therapy with anticoagulant and clopidogrel.  He agrees. Past Medical History:  Diagnosis Date  . Anxiety   . Arthritis   . Atypical atrial flutter ([place]) 12/14/2014  . Bradycardia, sinus 10/15/2014  . Burning sensation of feet 01/17/2016  . CAD, multiple vessel 10/18/2014   Overview:  DES to [facility] for unstable angina 10/17/14 Diagnostic Summary Severe stenosis of mid Cx likely cause of recent accelerating angina Diffuse severe stenosis of small caliber LAD unchanged from 2012 Normal LV function Paroxysmal SVT noted during procedure 04/23/16:  PCI / Resolute Drug Eluting Stent of the proximal Left Anterior Descending Coronary Artery. Successful PTCA of the ostial 1st Diagonal Coronary Artery  . 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 Coronary artery disease 2003   s/p MI and numerous stents  . Coronary artery disease involving native coronary artery of native heart with angina pectoris ([place]) 10/18/2014   Overview:  DES to [facility] for unstable angina 10/17/14 Diagnostic Summary Severe stenosis of mid Cx likely cause of recent accelerating angina Diffuse severe stenosis of small caliber LAD unchanged from 2012 Normal  LV function Paroxysmal SVT noted during procedure 04/23/16:  PCI / Resolute Drug Eluting Stent of the proximal Left Anterior Descending Coronary Artery. Successful PTCA of the ostial 1st Di  . 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 Degenerative spondylolisthesis 05/02/2017  . Depression     . Enlarged prostate without lower urinary tract symptoms (luts) 2012   frequent urination and nocturia; sees dr [name] Late in [place]  . Essential hypertension 10/15/2014  . Gastroesophageal reflux disease without esophagitis 04/21/2016  . GERD (gastroesophageal reflux disease)   . Hyperlipidemia   . Hypertension   . Hypertensive heart disease with heart failure ([place]) 12/20/2016  . IHD (ischemic heart disease) 10/15/2014  . Lower limb pain, inferior, right 12/17/2017  . Lumbar radiculopathy 02/21/2016  . Myocardial infarction ([place]) 2003  . NSTEMI (non-ST elevated myocardial infarction) ([place]) 04/24/2016   Overview:  LHC 04/24/15: Severe stenosis of the LAD & Diagonal bifurcation  LV ejection fraction is 55-60 %  Interventional Summary Successful Angiosculpt PCI / Resolute Drug Eluting Stent of the proximal LAD Successful PTCA of the ostial 1st Diagonal Coronary Artery  . Pain in both feet 01/17/2016  . Pain in right knee 10/20/2017  . Pain of left hip joint 11/26/2017  . Paroxysmal SVT (supraventricular tachycardia) ([place]) 11/16/2014  . Precordial pain 10/15/2014  . PVC's (premature ventricular contractions) 04/22/2016  . RBBB 10/15/2014  . S/P lumbar fusion 04/24/2017  . Sacroiliac joint pain 09/10/2018  . Sleep disturbances 01/17/2016  . Snoring 01/17/2016  . Somatic dysfunction of left sacroiliac joint 06/19/2018  . Subscapularis (muscle) sprain 03/29/2011  . 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 CARDIAC CATHETERIZATION  10-2010   ptca/stent in [place]  . CARDIAC CATHETERIZATION  04/23/2016  . CORONARY ANGIOPLASTY    . EYE SURGERY  05-1010   bil cataract ,iol  . FRACTURE SURGERY    . I & D EXTREMITY Right 02/23/2015   Procedure: RIGHT RING FINGER REVISION AMPUTATION;  Surgeon: Leanora Cover, MD;  Location: [facility];  Service: Orthopedics;  Laterality: Right;  . KNEE ARTHROSCOPY W/ MENISCAL REPAIR  2000   left knee  . LUMBAR EPIDURAL  INJECTION  2011   low back pain; DDD  . ring finger tramatic qamputation Right   . SHOULDER ARTHROSCOPY  03/29/2011   Procedure: ARTHROSCOPY SHOULDER;  Surgeon: Augustin Schooling;  Location: [facility];  Service: Orthopedics;  Laterality: Right;  RIGHT SHOULDER ARTHROSCOPY WITH OPEN SUBSCAPULAR REPAIR  . SHOULDER OPEN ROTATOR CUFF REPAIR     3 on rt shoulder; 2 on left  . TRANSFORAMINAL LUMBAR INTERBODY FUSION (TLIF) WITH PEDICLE SCREW FIXATION 1 LEVEL N/A 04/24/2017   Procedure: TRANSFORAMINAL LUMBAR INTERBODY FUSION (TLIF) L4-5;  Surgeon: Melina Schools, MD;  Location: [facility];  Service: Orthopedics;  Laterality: N/A;  4 hrs    Current Medications: Current Meds  Medication Sig  . acetaminophen (TYLENOL) 500 MG tablet Take 1,000 mg by mouth at bedtime as needed.   . clopidogrel (PLAVIX) 75 MG tablet TAKE 1 TABLET(75 MG) BY MOUTH DAILY  . Co-Enzyme Q-10 30 MG CAPS Take 100 mg by mouth daily.  . Cyanocobalamin (B-12 PO) Take 1 tablet by mouth daily.  . DULoxetine (CYMBALTA) 30 MG capsule Take 30 mg by mouth 2 (two) times daily.  Marland Kitchen EPINEPHrine (EPIPEN 2-PAK) 0.3 mg/0.3 mL IJ SOAJ injection   . esomeprazole (NEXIUM) 40 MG capsule TAKE 1 CAPSULE BY MOUTH DAILY BEFORE BREAKFAST  .  ezetimibe (ZETIA) 10 MG tablet Take 1 tablet (10 mg total) by mouth every evening.  . finasteride (PROSCAR) 5 MG tablet Take 5 mg by mouth daily.  . Flaxseed, Linseed, (FLAXSEED OIL MAX STR) 1300 MG CAPS Take 1,300 mg by mouth daily.  . furosemide (LASIX) 80 MG tablet ALTERNATE 1/2 TABLET DAILY WITH 1 TABLET EVERY OTHER DAY  . gemfibrozil (LOPID) 600 MG tablet TAKE 1/2 TABLET BY MOUTH TWICE DAILY BEFORE A MEAL  . Glucosamine HCl (GLUCOSAMINE PO) Take 1 tablet by mouth daily.  . isosorbide mononitrate (IMDUR) 60 MG 24 hr tablet Take 1 tablet (60 mg total) by mouth daily.  . metoprolol succinate (TOPROL-XL) 25 MG 24 hr tablet TAKE 1 TABLET BY MOUTH EVERY DAY. TAKE WITH OR IMMEDIATELY FOLLOWING A MEAL  . minocycline  (MINOCIN,DYNACIN) 50 MG capsule TAKE 1 CAPSULE BY MOUTH QD  . Misc Natural Products (PUMPKIN SEED OIL) CAPS Take 1 capsule by mouth daily.  . Multiple Vitamins-Minerals (MULTIVITAMINS THER. W/MINERALS) TABS Take 1 tablet by mouth daily.    . nitroGLYCERIN (NITROSTAT) 0.4 MG SL tablet Place 1 tablet (0.4 mg total) under the tongue every 5 (five) minutes as needed for chest pain. TAKE 1 TABLET UNDER THE TONGUE AS NEEDED AS DIRECTED FOR CHEST PAIN.  Marland Kitchen Omega-3 Fatty Acids (FISH OIL PO) Take 1 tablet by mouth daily.  . potassium chloride SA (KLOR-CON) 20 MEQ tablet TAKE 1 TABLET BY MOUTH TWICE DAILY  . rivaroxaban (XARELTO) 20 MG TABS tablet Take 1 tablet (20 mg total) by mouth daily with supper.  . simvastatin (ZOCOR) 40 MG tablet TAKE 1 TABLET(40 MG) BY MOUTH AT BEDTIME  . sotalol (BETAPACE) 80 MG tablet Take 80 mg by mouth 2 (two) times daily.  . tamsulosin (FLOMAX) 0.4 MG CAPS capsule Take 0.4 mg by mouth daily.  . traZODone (DESYREL) 50 MG tablet Take 50 mg by mouth at bedtime.  . vitamin E 400 UNIT capsule Take 400 Units by mouth daily.     Allergies:   Buprenorphine hcl, Codeine, Hydrocodone, and Percocet [oxycodone-acetaminophen]   Social History   Socioeconomic History  . Marital status: Married    Spouse name: Not on file  . Number of children: Not on file  . Years of education: Not on file  . Highest education level: Not on file  Occupational History  . Not on file  Tobacco Use  . Smoking status: Former Smoker    Packs/day: 1.00    Years: 20.00    Pack years: 20.00    Types: Cigarettes    Quit date: 03/20/1994    Years since quitting: 25.4  . Smokeless tobacco: Former Systems developer    Types: [employer] date: 03/20/1996  Substance and Sexual Activity  . Alcohol use: Yes    Alcohol/week: 6.0 standard drinks    Types: 6 Cans of beer per week  . Drug use: No  . Sexual activity: Not on file  Other Topics Concern  . Not on file  Social History Narrative  . Not on file    Social Determinants of Health   Financial Resource Strain:   . Difficulty of Paying Living Expenses:   Food Insecurity:   . Worried About Charity fundraiser in the Last Year:   . Arboriculturist in the Last Year:   Transportation Needs:   . Film/video editor (Medical):   Marland Kitchen Lack of Transportation (Non-Medical):   Physical Activity:   . Days of Exercise per  Week:   . Minutes of Exercise per Session:   Stress:   . Feeling of Stress :   Social Connections:   . Frequency of Communication with Friends and Family:   . Frequency of Social Gatherings with Friends and Family:   . Attends Religious Services:   . Active Member of Clubs or Organizations:   . Attends Archivist Meetings:   Marland Kitchen Marital Status:      Family History: The patient's family history includes Cancer in his father and sister; Heart disease in his father and mother. ROS:   Please see the history of present illness.    All other systems reviewed and are negative.  EKGs/Labs/Other Studies Reviewed:    The following studies were reviewed today:  EKG:  EKG ordered today and personally reviewed.  The ekg ordered today demonstrates atrial fibrillation rate 115 bpm right bundle branch block  Recent Labs: 07/26/2019: BUN 16; Creatinine, Ser 1.05; Hemoglobin 14.3; Platelets 221; Potassium 4.6; Sodium 145  Recent Lipid Panel    Component Value Date/Time   CHOL 152 04/08/2018 0921   TRIG 198 (H) 04/08/2018 0921   HDL 48 04/08/2018 0921   CHOLHDL 3.2 04/08/2018 0921   LDLCALC 64 04/08/2018 0921    Physical Exam:    VS:  BP 124/88   Pulse (!) 115   Temp 98.1 F (36.7 C)   Ht 5\' 7"  (1.702 m)   Wt 222 lb 12.8 oz (101.1 kg)   SpO2 99%   BMI 34.90 kg/m     Wt Readings from Last 3 Encounters:  08/27/19 222 lb 12.8 oz (101.1 kg)  07/26/19 227 lb (103 kg)  04/26/19 223 lb 6.4 oz (101.3 kg)     GEN:  Well nourished, well developed in no acute distress HEENT: Normal NECK: No JVD; No carotid  bruits LYMPHATICS: No lymphadenopathy CARDIAC: Irregular S1 variable, no murmurs, rubs, gallops RESPIRATORY:  Clear to auscultation without rales, wheezing or rhonchi  ABDOMEN: Soft, non-tender, non-distended MUSCULOSKELETAL:  No edema; No deformity  SKIN: Warm and dry NEUROLOGIC:  Alert and oriented x 3 PSYCHIATRIC:  Normal affect    Signed, Shirlee More, MD  08/27/2019 9:54 AM    [place]

## 2019-08-27 ENCOUNTER — Other Ambulatory Visit: Payer: Self-pay

## 2019-08-27 ENCOUNTER — Ambulatory Visit (INDEPENDENT_AMBULATORY_CARE_PROVIDER_SITE_OTHER): Payer: Medicare Other | Admitting: Cardiology

## 2019-08-27 VITALS — BP 124/88 | HR 115 | Temp 98.1°F | Ht 67.0 in | Wt 222.8 lb

## 2019-08-27 DIAGNOSIS — I110 Hypertensive heart disease with heart failure: Secondary | ICD-10-CM

## 2019-08-27 DIAGNOSIS — Z7901 Long term (current) use of anticoagulants: Secondary | ICD-10-CM | POA: Diagnosis not present

## 2019-08-27 DIAGNOSIS — I4819 Other persistent atrial fibrillation: Secondary | ICD-10-CM

## 2019-08-27 DIAGNOSIS — I25119 Atherosclerotic heart disease of native coronary artery with unspecified angina pectoris: Secondary | ICD-10-CM

## 2019-08-27 DIAGNOSIS — E782 Mixed hyperlipidemia: Secondary | ICD-10-CM

## 2019-08-27 MED ORDER — METOPROLOL SUCCINATE ER 25 MG PO TB24: 25.0000 mg | ORAL_TABLET | Freq: Two times a day (BID) | ORAL | 3 refills | Status: DC

## 2019-08-27 NOTE — Addendum Note (Signed)
Addended by: Resa Miner I on: 08/27/2019 09:59 AM   Modules accepted: Orders

## 2019-08-27 NOTE — Patient Instructions (Signed)
Medication Instructions:  Your physician has recommended you make the following change in your medication:  STOP: Sotalol INCREASE: Toprol XL take one tablet by mouth twice daily.  *If you need a refill on your cardiac medications before your next appointment, please call your pharmacy*   Lab Work: None If you have labs (blood work) drawn today and your tests are completely normal, you will receive your results only by: Marland Kitchen MyChart Message (if you have MyChart) OR . A paper copy in the mail If you have any lab test that is abnormal or we need to change your treatment, we will call you to review the results.   Testing/Procedures: None   Follow-Up: At [facility], you and your health needs are our priority.  As part of our continuing mission to provide you with exceptional heart care, we have created designated Provider Care Teams.  These Care Teams include your primary Cardiologist (physician) and Advanced Practice Providers (APPs -  Physician Assistants and Nurse Practitioners) who all work together to provide you with the care you need, when you need it.  We recommend signing up for the patient portal called "MyChart".  Sign up information is provided on this After Visit Summary.  MyChart is used to connect with patients for Virtual Visits (Telemedicine).  Patients are able to view lab/test results, encounter notes, upcoming appointments, etc.  Non-urgent messages can be sent to your provider as well.   To learn more about what you can do with MyChart, go to [email or web address].    Your next appointment:   3 month(s)  The format for your next appointment:   In Person  Provider:   Shirlee More, MD   Other Instructions

## 2019-09-05 ENCOUNTER — Other Ambulatory Visit: Payer: Self-pay | Admitting: Cardiology

## 2019-09-13 ENCOUNTER — Other Ambulatory Visit: Payer: Self-pay | Admitting: Cardiology

## 2019-10-08 ENCOUNTER — Other Ambulatory Visit: Payer: Self-pay | Admitting: Cardiology

## 2019-10-31 NOTE — Progress Notes (Signed)
Electrophysiology Office Note   Date:  11/01/2019   ID:  Andrew Montane., DOB 06-Jan-1941, MRN [id number]  PCP:  [facility]  Cardiologist:  Bettina Gavia Primary Electrophysiologist:  Maanya Hippert Andrew Leeds, MD    Chief Complaint: AF  History of Present Illness: Andrew Inks. is a 79 y.o. male who is being seen today for the evaluation of AF at the request of Practice, Darel Hong*. Presenting today for electrophysiology evaluation.  He has a history significant for coronary artery disease with multiple stents, atypical atrial flutter, atrial fibrillation, hyperlipidemia, hypertension.  He has been on amiodarone in the past though had abnormal LFTs.   Today, denies symptoms of palpitations, chest pain, shortness of breath, orthopnea, PND, lower extremity edema, claudication, dizziness, presyncope, syncope, bleeding, or neurologic sequela. The patient is tolerating medications without difficulties.  Overall he is doing well.  He has no chest pain or shortness of breath.  He is in sinus rhythm today.  He is able to do all of his daily activities without restriction.  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   Past Medical History:  Diagnosis Date   Anxiety    Arthritis    Atypical atrial flutter ([place]) 12/14/2014   Bradycardia, sinus 10/15/2014   Burning sensation of feet 01/17/2016   CAD, multiple vessel 10/18/2014   Overview:  DES to [facility] for unstable angina 10/17/14 Diagnostic Summary Severe stenosis of mid Cx likely cause of recent accelerating angina Diffuse severe stenosis of small caliber LAD unchanged from 2012 Normal LV function Paroxysmal SVT noted during procedure 04/23/16:  PCI / Resolute Drug Eluting Stent of the proximal Left Anterior Descending Coronary Artery. Successful PTCA of the ostial 1st Diagonal Coronary Artery   Chronic left-sided low back pain with sciatica 02/21/2016   Coronary artery disease 2003   s/p MI and numerous stents    Coronary artery disease involving native coronary artery of native heart with angina pectoris ([place]) 10/18/2014   Overview:  DES to [facility] for unstable angina 10/17/14 Diagnostic Summary Severe stenosis of mid Cx likely cause of recent accelerating angina Diffuse severe stenosis of small caliber LAD unchanged from 2012 Normal LV function Paroxysmal SVT noted during procedure 04/23/16:  PCI / Resolute Drug Eluting Stent of the proximal Left Anterior Descending Coronary Artery. 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   IHD (ischemic heart disease) 10/15/2014   Lower limb pain, inferior, right 12/17/2017   Lumbar radiculopathy 02/21/2016   Myocardial infarction [facility]) 2003   NSTEMI (non-ST elevated myocardial infarction) ([place]) 04/24/2016   Overview:  LHC 04/24/15: Severe stenosis of the LAD & Diagonal bifurcation  LV ejection fraction is 55-60 %  Interventional Summary Successful Angiosculpt PCI / Resolute Drug Eluting Stent of the proximal LAD Successful PTCA of the ostial 1st Diagonal Coronary Artery   Pain in both feet 01/17/2016   Pain in right knee 10/20/2017   Pain of left hip joint 11/26/2017   Paroxysmal SVT (supraventricular tachycardia) ([place]) 11/16/2014   Precordial pain 10/15/2014  PVC's (premature ventricular contractions) 04/22/2016   RBBB 10/15/2014   S/P lumbar fusion 04/24/2017   Sacroiliac joint pain 09/10/2018   Sleep disturbances 01/17/2016   Snoring 01/17/2016   Somatic dysfunction of left  sacroiliac joint 06/19/2018   Subscapularis (muscle) sprain 03/29/2011   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   Past Surgical History:  Procedure Laterality Date   CARDIAC CATHETERIZATION  10-2010   ptca/stent in [place]   CARDIAC CATHETERIZATION  04/23/2016   CORONARY ANGIOPLASTY     EYE SURGERY  05-1010   bil cataract ,iol   FRACTURE SURGERY     I & D EXTREMITY Right 02/23/2015   Procedure: RIGHT RING FINGER REVISION AMPUTATION;  Surgeon: Leanora Cover, MD;  Location: [place];  Service: Orthopedics;  Laterality: Right;   KNEE ARTHROSCOPY W/ MENISCAL REPAIR  2000   left knee   LUMBAR EPIDURAL INJECTION  2011   low back pain; DDD   ring finger tramatic qamputation Right    SHOULDER ARTHROSCOPY  03/29/2011   Procedure: ARTHROSCOPY SHOULDER;  Surgeon: Augustin Schooling;  Location: [place];  Service: Orthopedics;  Laterality: Right;  RIGHT SHOULDER ARTHROSCOPY WITH OPEN SUBSCAPULAR REPAIR   SHOULDER OPEN ROTATOR CUFF REPAIR     3 on rt shoulder; 2 on left   TRANSFORAMINAL LUMBAR INTERBODY FUSION (TLIF) WITH PEDICLE SCREW FIXATION 1 LEVEL N/A 04/24/2017   Procedure: TRANSFORAMINAL LUMBAR INTERBODY FUSION (TLIF) L4-5;  Surgeon: Melina Schools, MD;  Location: [place];  Service: Orthopedics;  Laterality: N/A;  4 hrs     Current Outpatient Medications  Medication Sig Dispense Refill   acetaminophen (TYLENOL) 500 MG tablet Take 1,000 mg by mouth at bedtime as needed.      clopidogrel (PLAVIX) 75 MG tablet TAKE 1 TABLET(75 MG) BY MOUTH DAILY 90 tablet 0   Co-Enzyme Q-10 30 MG CAPS Take 100 mg by mouth daily.     Cyanocobalamin (B-12 PO) Take 1 tablet by mouth daily.     DULoxetine (CYMBALTA) 30 MG capsule Take 30 mg by mouth 2 (two) times daily.     EPINEPHrine (EPIPEN 2-PAK) 0.3 mg/0.3 mL IJ SOAJ injection      esomeprazole (NEXIUM) 40 MG capsule TAKE 1 CAPSULE BY MOUTH DAILY BEFORE BREAKFAST 90 capsule 0   ezetimibe (ZETIA) 10 MG tablet Take 1 tablet (10 mg total)  by mouth every evening. 90 tablet 3   finasteride (PROSCAR) 5 MG tablet Take 5 mg by mouth daily.     Flaxseed, Linseed, (FLAXSEED OIL MAX STR) 1300 MG CAPS Take 1,300 mg by mouth daily.     furosemide (LASIX) 80 MG tablet ALTERNATE 1/2 TABLET DAILY WITH 1 TABLET EVERY OTHER DAY 90 tablet 0   gemfibrozil (LOPID) 600 MG tablet TAKE 1/2 TABLET BY MOUTH TWICE DAILY BEFORE A MEAL 90 tablet 0   Glucosamine HCl (GLUCOSAMINE PO) Take 1 tablet by mouth daily.     isosorbide mononitrate (IMDUR) 60 MG 24 hr tablet TAKE 1 TABLET(60 MG) BY MOUTH DAILY 90 tablet 3   metoprolol succinate (TOPROL XL) 25 MG 24 hr tablet Take 1 tablet (25 mg total) by mouth 2 (two) times daily. 60 tablet 3   minocycline (MINOCIN,DYNACIN) 50 MG capsule TAKE 1 CAPSULE BY MOUTH QD  2   Misc Natural Products (PUMPKIN SEED OIL) CAPS Take 1 capsule by mouth daily.     Multiple Vitamins-Minerals (MULTIVITAMINS THER. W/MINERALS) TABS Take 1 tablet by mouth daily.       nitroGLYCERIN (NITROSTAT) 0.4  MG SL tablet Place 1 tablet (0.4 mg total) under the tongue every 5 (five) minutes as needed for chest pain. TAKE 1 TABLET UNDER THE TONGUE AS NEEDED AS DIRECTED FOR CHEST PAIN. 25 tablet 3   Omega-3 Fatty Acids (FISH OIL PO) Take 1 tablet by mouth daily.     potassium chloride SA (KLOR-CON) 20 MEQ tablet TAKE 1 TABLET BY MOUTH TWICE DAILY 180 tablet 1   rivaroxaban (XARELTO) 20 MG TABS tablet Take 1 tablet (20 mg total) by mouth daily with supper. 30 tablet 3   simvastatin (ZOCOR) 40 MG tablet TAKE 1 TABLET(40 MG) BY MOUTH AT BEDTIME 90 tablet 0   tamsulosin (FLOMAX) 0.4 MG CAPS capsule Take 0.4 mg by mouth daily.     traZODone (DESYREL) 50 MG tablet Take 50 mg by mouth at bedtime.     vitamin E 400 UNIT capsule Take 400 Units by mouth daily.     No current facility-administered medications for this visit.    Allergies:   Buprenorphine hcl, Codeine, Hydrocodone, and Percocet [oxycodone-acetaminophen]   Social History:   The patient  reports that he quit smoking about 25 years ago. His smoking use included cigarettes. He has a 20.00 pack-year smoking history. He quit smokeless tobacco use about 23 years ago.  His smokeless tobacco use included chew. He reports current alcohol use of about 6.0 standard drinks of alcohol per week. He reports that he does not use drugs.   Family History:  The patient's family history includes Cancer in his father and sister; Heart disease in his father and mother.    ROS:  Please see the history of present illness.   Otherwise, review of systems is positive for none.   All other systems are reviewed and negative.   PHYSICAL EXAM: VS:  BP 120/72    Pulse 72    Ht 5\' 7"  (1.702 m)    Wt 221 lb 12.8 oz (100.6 kg)    SpO2 99%    BMI 34.74 kg/m  , BMI Body mass index is 34.74 kg/m. GEN: Well nourished, well developed, in no acute distress  HEENT: normal  Neck: no JVD, carotid bruits, or masses Cardiac: RRR; no murmurs, rubs, or gallops,no edema  Respiratory:  clear to auscultation bilaterally, normal work of breathing GI: soft, nontender, nondistended, + BS MS: no deformity or atrophy  Skin: warm and dry Neuro:  Strength and sensation are intact Psych: euthymic mood, full affect  EKG:  EKG is ordered today. Personal review of the ekg ordered shows SR, rate 72, RBBB    Recent Labs: 07/26/2019: BUN 16; Creatinine, Ser 1.05; Hemoglobin 14.3; Platelets 221; Potassium 4.6; Sodium 145    Lipid Panel     Component Value Date/Time   CHOL 152 04/08/2018 0921   TRIG 198 (H) 04/08/2018 0921   HDL 48 04/08/2018 0921   CHOLHDL 3.2 04/08/2018 0921   LDLCALC 64 04/08/2018 0921     Wt Readings from Last 3 Encounters:  11/01/19 221 lb 12.8 oz (100.6 kg)  08/27/19 222 lb 12.8 oz (101.1 kg)  07/26/19 227 lb (103 kg)      Other studies Reviewed: Additional studies/ records that were reviewed today include: SPECT 03/25/19  Review of the above records today demonstrates:   There  was no ST segment deviation noted during stress.  No T wave inversion was noted during stress.  Nongated study due to atrial fibrillation.  The study is normal.  This is a low risk study.  TTE 05/04/2019 1. Left ventricular ejection fraction, by visual estimation, is 45 to  50%. There is mildly increased left ventricular hypertrophy.  2. Left atrial size was normal.  3. Right atrial size was normal.  4. The mitral valve is normal in structure. No evidence of mitral valve  regurgitation. No evidence of mitral stenosis.  5. The tricuspid valve is normal in structure.  6. The aortic valve is normal in structure. Aortic valve regurgitation is  trivial. No evidence of aortic valve sclerosis or stenosis.  7. The pulmonic valve was normal in structure. Pulmonic valve  regurgitation is not visualized.  8. Mildly elevated pulmonary artery systolic pressure.  9. The inferior vena cava is normal in size with greater than 50%  respiratory variability, suggesting right atrial pressure of 3 mmHg.    ASSESSMENT AND PLAN:  1.  Persistent atrial fibrillation: Currently on Xarelto with a CHA2DS2-VASc of 4.  Also on metoprolol.  He remains in sinus rhythm.  He was initially started on sotalol but did not tolerate this.  He would potentially be able to take dofetilide if he goes back into atrial fibrillation and is symptomatic.  Otherwise we Andrew Hardy continue.  2.  Coronary artery disease: No current chest pain.  3.  Hypertension: Currently well controlled  4.  Hyperlipidemia: Plan per primary cardiology   Current medicines are reviewed at length with the patient today.   The patient does not have concerns regarding his medicines.  The following changes were made today: None  Labs/ tests ordered today include:  Orders Placed This Encounter  Procedures   EKG 12-Lead    Disposition:   FU with Andrew Hardy 6 months  Signed, Andrew Hardy Andrew Leeds, MD  11/01/2019 8:50 AM     [address] [id number] [phone] (office) [phone] (fax)

## 2019-11-01 ENCOUNTER — Ambulatory Visit (INDEPENDENT_AMBULATORY_CARE_PROVIDER_SITE_OTHER): Payer: Medicare Other | Admitting: Cardiology

## 2019-11-01 ENCOUNTER — Other Ambulatory Visit: Payer: Self-pay

## 2019-11-01 ENCOUNTER — Encounter: Payer: Self-pay | Admitting: Cardiology

## 2019-11-01 VITALS — BP 120/72 | HR 72 | Ht 67.0 in | Wt 221.8 lb

## 2019-11-01 DIAGNOSIS — I25119 Atherosclerotic heart disease of native coronary artery with unspecified angina pectoris: Secondary | ICD-10-CM | POA: Diagnosis not present

## 2019-11-01 DIAGNOSIS — I4819 Other persistent atrial fibrillation: Secondary | ICD-10-CM

## 2019-11-01 NOTE — Patient Instructions (Signed)
Medication Instructions:  Your physician recommends that you continue on your current medications as directed. Please refer to the Current Medication list given to you today.  *If you need a refill on your cardiac medications before your next appointment, please call your pharmacy*   Lab Work: None ordered   Testing/Procedures: None ordered   Follow-Up: At [facility], you and your health needs are our priority.  As part of our continuing mission to provide you with exceptional heart care, we have created designated Provider Care Teams.  These Care Teams include your primary Cardiologist (physician) and Advanced Practice Providers (APPs -  Physician Assistants and Nurse Practitioners) who all work together to provide you with the care you need, when you need it.  We recommend signing up for the patient portal called "MyChart".  Sign up information is provided on this After Visit Summary.  MyChart is used to connect with patients for Virtual Visits (Telemedicine).  Patients are able to view lab/test results, encounter notes, upcoming appointments, etc.  Non-urgent messages can be sent to your provider as well.   To learn more about what you can do with MyChart, go to [email or web address].    Your next appointment:   6 month(s)  The format for your next appointment:   In Person  Provider:   Will Camnitz, MD   Thank you for choosing [facility]!!   Bereket Gernert, RN [phone]    Other Instructions    

## 2019-11-11 ENCOUNTER — Other Ambulatory Visit: Payer: Self-pay | Admitting: Cardiology

## 2019-12-01 ENCOUNTER — Other Ambulatory Visit: Payer: Self-pay | Admitting: Cardiology

## 2019-12-08 DIAGNOSIS — F329 Major depressive disorder, single episode, unspecified: Secondary | ICD-10-CM | POA: Insufficient documentation

## 2019-12-08 DIAGNOSIS — F419 Anxiety disorder, unspecified: Secondary | ICD-10-CM | POA: Insufficient documentation

## 2019-12-08 DIAGNOSIS — F32A Depression, unspecified: Secondary | ICD-10-CM | POA: Insufficient documentation

## 2019-12-08 DIAGNOSIS — I10 Essential (primary) hypertension: Secondary | ICD-10-CM | POA: Insufficient documentation

## 2019-12-10 ENCOUNTER — Other Ambulatory Visit: Payer: Self-pay | Admitting: Cardiology

## 2019-12-12 NOTE — Progress Notes (Signed)
Cardiology Office Note:    Date:  12/13/2019   ID:  [facility]., DOB 01-Nov-1940, MRN [id number]  PCP:  [facility]  Cardiologist:  Shirlee More, MD    Referring MD: [facility], [facility]*    ASSESSMENT:    1. Atypical atrial flutter ([place])   2. Chronic anticoagulation   3. Coronary artery disease involving native coronary artery of native heart with angina pectoris ([place])   4. Hypertensive heart disease with heart failure ([place])   5. Mixed hyperlipidemia    PLAN:    In order of problems listed above:  1. We will keep him on sotalol anticoagulant beta-blocker.  Clinically he is doing well and is not having symptoms of breakthrough atrial flutter 2. Continue combined anticoagulant antiplatelet with coincident CAD 3. Stable continue medical therapy 4. BP at target continue current treatment including beta-blocker diuretic and check potassium as he takes an antiarrhythmic drug 5. Continue statin his most recent lipid profile 08/31/2019 is at target cholesterol 142 LDL 65 HDL 41 triglyceride 218.   Next appointment: 6 months   Medication Adjustments/Labs and Tests Ordered: Current medicines are reviewed at length with the patient today.  Concerns regarding medicines are outlined above.  Orders Placed This Encounter  Procedures  . Basic metabolic panel  . EKG 12-Lead   Meds ordered this encounter  Medications  . sotalol (BETAPACE) 80 MG tablet    Sig: Take 0.5 tablets (40 mg total) by mouth 2 (two) times daily.    Dispense:  90 tablet    Refill:  3    No chief complaint on file.   History of Present Illness:    Andrew Krantz. is a 79 y.o. male with a hx of CAD, NonSTEMI 04/22/16  with DES to [facility] for unstable angina 10/17/14 , PCI / Resolute Drug Eluting Stent of the proximal Left Anterior Descending Coronary Artery and PTCA of the ostial 1st Diagonal Coronary Artery on 04/23/16 Dyslipidemia, Hypertensive heart disease with heart disease, and  atypical atrial flutter.  He was last seen by me 08/27/2019.  At that visit, he failed maintenance of sinus rhythm and decision was made for rate control and anticoagulation.  Subsequently when seen 11/01/2019 he was in sinus rhythm.  I personally reviewed that EKG ordered by EP showing sinus rhythm left atrial enlargement right bundle branch block.  He is not on an antiarrhythmic drug as he did not tolerate sotalol.  Compliance with diet, lifestyle and medications: Yes  He has had extreme stress in his life his wife had a stroke subsequent fall fractured her femur had a total knee arthroplasty and continues to fall at home.  He put himself back on sotalol and in general has done well and has no awareness of atrial flutter rather curiously when he comes to my office he is in an atypical atrial flutter 2-1 conduction rate of 126 and had converted by the time I got in the room to see him with a pulse of 60 bpm.  At this time I think the best thing we can do is continue his antiarrhythmic drug his QT interval is normal.  No chest pain shortness of breath edema palpitation or syncope and tolerates his anticoagulant without bleeding.  He also takes clopidogrel along with Xarelto. Past Medical History:  Diagnosis Date  . Anxiety   . Arthritis   . Atypical atrial flutter ([place]) 12/14/2014  . Bradycardia, sinus 10/15/2014  . Burning sensation of feet 01/17/2016  .  CAD, multiple vessel 10/18/2014   Overview:  DES to [facility] for unstable angina 10/17/14 Diagnostic Summary Severe stenosis of mid Cx likely cause of recent accelerating angina Diffuse severe stenosis of small caliber LAD unchanged from 2012 Normal LV function Paroxysmal SVT noted during procedure 04/23/16:  PCI / Resolute Drug Eluting Stent of the proximal Left Anterior Descending Coronary Artery. Successful PTCA of the ostial 1st Diagonal Coronary Artery  . 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 Coronary artery disease 2003   s/p MI  and numerous stents  . Coronary artery disease involving native coronary artery of native heart with angina pectoris ([place]) 10/18/2014   Overview:  DES to [facility] for unstable angina 10/17/14 Diagnostic Summary Severe stenosis of mid Cx likely cause of recent accelerating angina Diffuse severe stenosis of small caliber LAD unchanged from 2012 Normal LV function Paroxysmal SVT noted during procedure 04/23/16:  PCI / Resolute Drug Eluting Stent of the proximal Left Anterior Descending Coronary Artery. Successful PTCA of the ostial 1st Di  . 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 Degenerative spondylolisthesis 05/02/2017  . Depression   . Enlarged prostate without lower urinary tract symptoms (luts) 2012   frequent urination and nocturia; sees dr [name] Late in [place]  . Essential hypertension 10/15/2014  . Gastroesophageal reflux disease without esophagitis 04/21/2016  . GERD (gastroesophageal reflux disease)   . Hyperlipidemia   . Hypertension   . Hypertensive heart disease with heart failure ([place]) 12/20/2016  . IHD (ischemic heart disease) 10/15/2014  . Lower limb pain, inferior, right 12/17/2017  . Lumbar radiculopathy 02/21/2016  . Myocardial infarction ([place]) 2003  . NSTEMI (non-ST elevated myocardial infarction) ([place]) 04/24/2016   Overview:  LHC 04/24/15: Severe stenosis of the LAD & Diagonal bifurcation  LV ejection fraction is 55-60 %  Interventional Summary Successful Angiosculpt PCI / Resolute Drug Eluting Stent of the proximal LAD Successful PTCA of the ostial 1st Diagonal Coronary Artery  . Pain in both feet 01/17/2016  . Pain in right knee 10/20/2017  . Pain of left hip joint 11/26/2017  . Paroxysmal SVT (supraventricular tachycardia) ([place]) 11/16/2014  . Precordial pain 10/15/2014  . PVC's (premature ventricular contractions) 04/22/2016  . RBBB 10/15/2014  . S/P lumbar fusion 04/24/2017  . Sacroiliac joint pain 09/10/2018  . Sleep disturbances 01/17/2016  . Snoring 01/17/2016  . Somatic  dysfunction of left sacroiliac joint 06/19/2018  . Subscapularis (muscle) sprain 03/29/2011  . 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 CARDIAC CATHETERIZATION  10-2010   ptca/stent in [place]  . CARDIAC CATHETERIZATION  04/23/2016  . CORONARY ANGIOPLASTY    . EYE SURGERY  05-1010   bil cataract ,iol  . FRACTURE SURGERY    . I & D EXTREMITY Right 02/23/2015   Procedure: RIGHT RING FINGER REVISION AMPUTATION;  Surgeon: Leanora Cover, MD;  Location: [facility];  Service: Orthopedics;  Laterality: Right;  . KNEE ARTHROSCOPY W/ MENISCAL REPAIR  2000   left knee  . LUMBAR EPIDURAL INJECTION  2011   low back pain; DDD  . ring finger tramatic qamputation Right   . SHOULDER ARTHROSCOPY  03/29/2011   Procedure: ARTHROSCOPY SHOULDER;  Surgeon: Augustin Schooling;  Location: [facility];  Service: Orthopedics;  Laterality: Right;  RIGHT SHOULDER ARTHROSCOPY WITH OPEN SUBSCAPULAR REPAIR  . SHOULDER OPEN ROTATOR CUFF REPAIR     3 on rt shoulder; 2 on left  . TRANSFORAMINAL LUMBAR INTERBODY FUSION (  TLIF) WITH PEDICLE SCREW FIXATION 1 LEVEL N/A 04/24/2017   Procedure: TRANSFORAMINAL LUMBAR INTERBODY FUSION (TLIF) L4-5;  Surgeon: Melina Schools, MD;  Location: [facility];  Service: Orthopedics;  Laterality: N/A;  4 hrs    Current Medications: Current Meds  Medication Sig  . acetaminophen (TYLENOL) 500 MG tablet Take 1,000 mg by mouth at bedtime as needed.   . clopidogrel (PLAVIX) 75 MG tablet TAKE 1 TABLET(75 MG) BY MOUTH DAILY  . Co-Enzyme Q-10 30 MG CAPS Take 100 mg by mouth daily.  . Cyanocobalamin (B-12 PO) Take 1 tablet by mouth daily.  . DULoxetine (CYMBALTA) 30 MG capsule Take 30 mg by mouth 2 (two) times daily.  Marland Kitchen EPINEPHrine (EPIPEN 2-PAK) 0.3 mg/0.3 mL IJ SOAJ injection   . esomeprazole (NEXIUM) 40 MG capsule TAKE 1 CAPSULE BY MOUTH DAILY BEFORE BREAKFAST  . ezetimibe (ZETIA) 10 MG tablet Take 1 tablet (10 mg total) by mouth every evening.    . finasteride (PROSCAR) 5 MG tablet Take 5 mg by mouth daily.  . Flaxseed, Linseed, (FLAXSEED OIL MAX STR) 1300 MG CAPS Take 1,300 mg by mouth daily.  . furosemide (LASIX) 80 MG tablet ALTERNATE 1/2 TABLET DAILY WITH 1 TABLET EVERY OTHER DAY  . gemfibrozil (LOPID) 600 MG tablet TAKE 1/2 TABLET BY MOUTH TWICE DAILY BEFORE A MEAL  . Glucosamine HCl (GLUCOSAMINE PO) Take 1 tablet by mouth daily.  . isosorbide mononitrate (IMDUR) 60 MG 24 hr tablet TAKE 1 TABLET(60 MG) BY MOUTH DAILY  . metoprolol succinate (TOPROL-XL) 25 MG 24 hr tablet TAKE 1 TABLET(25 MG) BY MOUTH TWICE DAILY  . minocycline (MINOCIN,DYNACIN) 50 MG capsule TAKE 1 CAPSULE BY MOUTH QD  . Misc Natural Products (PUMPKIN SEED OIL) CAPS Take 1 capsule by mouth daily.  . Multiple Vitamins-Minerals (MULTIVITAMINS THER. W/MINERALS) TABS Take 1 tablet by mouth daily.    . nitroGLYCERIN (NITROSTAT) 0.4 MG SL tablet Place 1 tablet (0.4 mg total) under the tongue every 5 (five) minutes as needed for chest pain. TAKE 1 TABLET UNDER THE TONGUE AS NEEDED AS DIRECTED FOR CHEST PAIN.  Marland Kitchen Omega-3 Fatty Acids (FISH OIL PO) Take 1 tablet by mouth daily.  . potassium chloride SA (KLOR-CON) 20 MEQ tablet TAKE 1 TABLET BY MOUTH TWICE DAILY  . rivaroxaban (XARELTO) 20 MG TABS tablet Take 1 tablet (20 mg total) by mouth daily with supper.  . simvastatin (ZOCOR) 40 MG tablet TAKE 1 TABLET(40 MG) BY MOUTH AT BEDTIME  . tamsulosin (FLOMAX) 0.4 MG CAPS capsule Take 0.4 mg by mouth daily.  . traZODone (DESYREL) 50 MG tablet Take 50 mg by mouth at bedtime.  . vitamin E 400 UNIT capsule Take 400 Units by mouth daily.     Allergies:   Sotalol hcl, Buprenorphine hcl, Codeine, Hydrocodone, and Percocet [oxycodone-acetaminophen]   Social History   Socioeconomic History  . Marital status: Married    Spouse name: Not on file  . Number of children: Not on file  . Years of education: Not on file  . Highest education level: Not on file  Occupational History  .  Not on file  Tobacco Use  . Smoking status: Former Smoker    Packs/day: 1.00    Years: 20.00    Pack years: 20.00    Types: Cigarettes    Quit date: 03/20/1994    Years since quitting: 25.7  . Smokeless tobacco: Former Systems developer    Types: [place] date: 03/20/1996  Vaping Use  . Vaping Use: Never  used  Substance and Sexual Activity  . Alcohol use: Yes    Alcohol/week: 6.0 standard drinks    Types: 6 Cans of beer per week  . Drug use: No  . Sexual activity: Not on file  Other Topics Concern  . Not on file  Social History Narrative  . Not on file   Social Determinants of Health   Financial Resource Strain:   . Difficulty of Paying Living Expenses: Not on file  Food Insecurity:   . Worried About Charity fundraiser in the Last Year: Not on file  . Ran Out of Food in the Last Year: Not on file  Transportation Needs:   . Lack of Transportation (Medical): Not on file  . Lack of Transportation (Non-Medical): Not on file  Physical Activity:   . Days of Exercise per Week: Not on file  . Minutes of Exercise per Session: Not on file  Stress:   . Feeling of Stress : Not on file  Social Connections:   . Frequency of Communication with Friends and Family: Not on file  . Frequency of Social Gatherings with Friends and Family: Not on file  . Attends Religious Services: Not on file  . Active Member of Clubs or Organizations: Not on file  . Attends Archivist Meetings: Not on file  . Marital Status: Not on file     Family History: The patient's family history includes Cancer in his father and sister; Heart disease in his father and mother. ROS:   Please see the history of present illness.    All other systems reviewed and are negative.  EKGs/Labs/Other Studies Reviewed:    The following studies were reviewed today:  EKG:  EKG ordered today and personally reviewed.  The ekg ordered today demonstrates atypical atrial flutter 2-1 conduction right bundle branch  block  Recent Labs: 07/26/2019: BUN 16; Creatinine, Ser 1.05; Hemoglobin 14.3; Platelets 221; Potassium 4.6; Sodium 145  Recent Lipid Panel    Component Value Date/Time   CHOL 152 04/08/2018 0921   TRIG 198 (H) 04/08/2018 0921   HDL 48 04/08/2018 0921   CHOLHDL 3.2 04/08/2018 0921   LDLCALC 64 04/08/2018 0921    Physical Exam:    VS:  BP 126/64   Pulse (!) 126   Ht 5\' 7"  (1.702 m)   Wt 222 lb (100.7 kg)   SpO2 96%   BMI 34.77 kg/m     Wt Readings from Last 3 Encounters:  12/13/19 222 lb (100.7 kg)  11/01/19 221 lb 12.8 oz (100.6 kg)  08/27/19 222 lb 12.8 oz (101.1 kg)     GEN:  Well nourished, well developed in no acute distress HEENT: Normal NECK: No JVD; No carotid bruits LYMPHATICS: No lymphadenopathy CARDIAC: RRR, no murmurs, rubs, gallops RESPIRATORY:  Clear to auscultation without rales, wheezing or rhonchi  ABDOMEN: Soft, non-tender, non-distended MUSCULOSKELETAL:  No edema; No deformity  SKIN: Warm and dry NEUROLOGIC:  Alert and oriented x 3 PSYCHIATRIC:  Normal affect    Signed, Shirlee More, MD  12/13/2019 8:43 AM    [facility]

## 2019-12-13 ENCOUNTER — Other Ambulatory Visit: Payer: Self-pay

## 2019-12-13 ENCOUNTER — Ambulatory Visit (INDEPENDENT_AMBULATORY_CARE_PROVIDER_SITE_OTHER): Payer: Medicare Other | Admitting: Cardiology

## 2019-12-13 ENCOUNTER — Encounter: Payer: Self-pay | Admitting: Cardiology

## 2019-12-13 VITALS — BP 126/64 | HR 126 | Ht 67.0 in | Wt 222.0 lb

## 2019-12-13 DIAGNOSIS — I484 Atypical atrial flutter: Secondary | ICD-10-CM

## 2019-12-13 DIAGNOSIS — E782 Mixed hyperlipidemia: Secondary | ICD-10-CM

## 2019-12-13 DIAGNOSIS — Z7901 Long term (current) use of anticoagulants: Secondary | ICD-10-CM

## 2019-12-13 DIAGNOSIS — I110 Hypertensive heart disease with heart failure: Secondary | ICD-10-CM

## 2019-12-13 DIAGNOSIS — I25119 Atherosclerotic heart disease of native coronary artery with unspecified angina pectoris: Secondary | ICD-10-CM

## 2019-12-13 LAB — BASIC METABOLIC PANEL
BUN/Creatinine Ratio: 14 (ref 10–24)
BUN: 17 mg/dL (ref 8–27)
CO2: 24 mmol/L (ref 20–29)
Calcium: 9.4 mg/dL (ref 8.6–10.2)
Chloride: 103 mmol/L (ref 96–106)
Creatinine, Ser: 1.21 mg/dL (ref 0.76–1.27)
GFR calc Af Amer: 66 mL/min/{1.73_m2} (ref >59)
GFR calc non Af Amer: 57 mL/min/{1.73_m2} — ABNORMAL LOW (ref >59)
Glucose: 122 mg/dL — ABNORMAL HIGH (ref 65–99)
Potassium: 4.4 mmol/L (ref 3.5–5.2)
Sodium: 141 mmol/L (ref 134–144)

## 2019-12-13 MED ORDER — SOTALOL HCL 80 MG PO TABS: 40.0000 mg | ORAL_TABLET | Freq: Two times a day (BID) | ORAL | 3 refills | Status: DC

## 2019-12-13 NOTE — Patient Instructions (Signed)
Medication Instructions:  Your physician recommends that you continue on your current medications as directed. Please refer to the Current Medication list given to you today.  *If you need a refill on your cardiac medications before your next appointment, please call your pharmacy*   Lab Work: Your physician recommends that you return for lab work in: TODAY BMP If you have labs (blood work) drawn today and your tests are completely normal, you will receive your results only by: MyChart Message (if you have MyChart) OR A paper copy in the mail If you have any lab test that is abnormal or we need to change your treatment, we will call you to review the results.   Testing/Procedures: None   Follow-Up: At [facility], you and your health needs are our priority.  As part of our continuing mission to provide you with exceptional heart care, we have created designated Provider Care Teams.  These Care Teams include your primary Cardiologist (physician) and Advanced Practice Providers (APPs -  Physician Assistants and Nurse Practitioners) who all work together to provide you with the care you need, when you need it.  We recommend signing up for the patient portal called "MyChart".  Sign up information is provided on this After Visit Summary.  MyChart is used to connect with patients for Virtual Visits (Telemedicine).  Patients are able to view lab/test results, encounter notes, upcoming appointments, etc.  Non-urgent messages can be sent to your provider as well.   To learn more about what you can do with MyChart, go to [email or web address].    Your next appointment:   6 month(s)  The format for your next appointment:   In Person  Provider:   Brian Munley, MD   Other Instructions   

## 2019-12-14 ENCOUNTER — Telehealth: Payer: Self-pay

## 2019-12-14 NOTE — Telephone Encounter (Signed)
Spoke with patient regarding results and recommendation.  Patient verbalizes understanding and is agreeable to plan of care. Advised patient to call back with any issues or concerns.  

## 2019-12-14 NOTE — Telephone Encounter (Signed)
-----   Message from Richardo Priest, MD sent at 12/13/2019  5:08 PM EDT ----- No changes

## 2019-12-22 ENCOUNTER — Other Ambulatory Visit: Payer: Self-pay | Admitting: Cardiology

## 2020-01-24 ENCOUNTER — Other Ambulatory Visit: Payer: Self-pay | Admitting: Legal Medicine

## 2020-02-07 ENCOUNTER — Other Ambulatory Visit: Payer: Self-pay | Admitting: Cardiology

## 2020-03-19 NOTE — Progress Notes (Signed)
Cardiology Office Note:    Date:  03/20/2020   ID:  Andrew Montane., DOB Mar 31, 1941, MRN [id number]  PCP:  [facility]  Cardiologist:  Shirlee More, MD    Referring MD: [facility], [facility]*    ASSESSMENT:    1. Coronary artery disease involving native coronary artery of native heart with angina pectoris ([place])   2. Hypertensive heart disease with heart failure ([place])   3. Mixed hyperlipidemia   4. Atypical atrial flutter ([place])   5. Chronic anticoagulation   6. High risk medication use    PLAN:    In order of problems listed above:  1. Stable CAD continue medical therapy 2. BP at target continue current treatment 3. Stable continue statin 4. Today in atrial fibrillation rapid rate continue beta-blocker add digoxin and continue his current anticoagulant. 5. He is no longer on sotalol   Next appointment: 2 to 3 weeks   Medication Adjustments/Labs and Tests Ordered: Current medicines are reviewed at length with the patient today.  Concerns regarding medicines are outlined above.  No orders of the defined types were placed in this encounter.  No orders of the defined types were placed in this encounter.   No chief complaint on file.   History of Present Illness:    Andrew Hardy. is a 79 y.o. male with a hx of CAD non-ST elevation MI 04/22/2016 with drug-eluting stent to the left circumflex 2016 drug-eluting stent to the LAD 2018 with an angioplasty of first diagonal branch, paroxysmal atrial fibrillation atypical atrial flutter hypertensive heart disease with heart failure and dyslipidemia.  He was last seen 12/13/2019 and atypical atrial flutter maintaining sinus rhythm on sotalol and anticoagulated with Xarelto and also takes clopidogrel.Marland Kitchen  His wife was seen as a patient last week related he was having frequent angina and requested to be worked into my office hours urgently.  Compliance with diet, lifestyle and medications: Yes  His complaint  is very short of breath bending over and fatigue.  He is aware of his heart beating rapid and stopped taking sotalol on his own.  He is in rapid atrial fibrillation today taking beta-blocker and will continue his beta-blocker and digoxin check level in 1 week and follow-up with me in 2 to 3 weeks.  At this time he is not having angina I do not think he needs heart catheterization.  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  He was in atrial fibrillation flutter last visit.  Chart review had a myocardial perfusion test Lexiscan performed 03/25/2019 showing no ischemic EKG changes normal perfusion and normal left ventricular function.  Left heart cath 04/23/2016 [facility] [place]: Diagnostic Summary  Severe stenosis of the LAD & Diagonal bifurcation  Otherwise Diffuse Mild non-obstructive coronary artery disease.  Normal LV function LV ejection fraction is 55-60 %  Interventional Summary  Successful Angiosculpt PCI / Resolute Drug Eluting Stent of the proximal Left  Anterior Descending Coronary Artery.  Successful PTCA of the ostial 1st Diagonal Coronary Artery.  InterventionalRecommendations  Medical therapy for CAD Risk factor reduction, Angina  Dual anti-platelet therapy with Clopidogrel and Aspirin 81 mg for at least 12  months is recommended.   Past Medical History:  Diagnosis Date  . Anxiety   . Arthritis   . Atypical atrial flutter ([place]) 12/14/2014  . Bradycardia, sinus 10/15/2014  . Burning sensation of feet  01/17/2016  . CAD, multiple vessel 10/18/2014   Overview:  DES to [facility] for unstable angina 10/17/14 Diagnostic Summary Severe stenosis of mid Cx likely cause of recent accelerating angina Diffuse severe stenosis of small caliber LAD unchanged from 2012 Normal LV function Paroxysmal SVT noted during procedure 04/23/16:  PCI / Resolute Drug Eluting Stent of the proximal Left Anterior  Descending Coronary Artery. Successful PTCA of the ostial 1st Diagonal Coronary Artery  . 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 Coronary artery disease 2003   s/p MI and numerous stents  . Coronary artery disease involving native coronary artery of native heart with angina pectoris ([place]) 10/18/2014   Overview:  DES to [facility] for unstable angina 10/17/14 Diagnostic Summary Severe stenosis of mid Cx likely cause of recent accelerating angina Diffuse severe stenosis of small caliber LAD unchanged from 2012 Normal LV function Paroxysmal SVT noted during procedure 04/23/16:  PCI / Resolute Drug Eluting Stent of the proximal Left Anterior Descending Coronary Artery. Successful PTCA of the ostial 1st Di  . 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 Degenerative spondylolisthesis 05/02/2017  . Depression   . Enlarged prostate without lower urinary tract symptoms ([place]) 2012   frequent urination and nocturia; sees dr [name] Late in [place]  . Essential hypertension 10/15/2014  . Gastroesophageal reflux disease without esophagitis 04/21/2016  . GERD (gastroesophageal reflux disease)   . Hyperlipidemia   . Hypertension   . Hypertensive heart disease with heart failure ([place]) 12/20/2016  . IHD (ischemic heart disease) 10/15/2014  . Lower limb pain, inferior, right 12/17/2017  . Lumbar radiculopathy 02/21/2016  . Myocardial infarction ([place]) 2003  . NSTEMI (non-ST elevated myocardial infarction) ([place]) 04/24/2016   Overview:  [facility]HC 04/24/15: Severe stenosis of the LAD & Diagonal bifurcation  LV ejection fraction is 55-60 %  Interventional Summary Successful Angiosculpt PCI / Resolute Drug Eluting Stent of the proximal LAD Successful PTCA of the ostial 1st Diagonal Coronary Artery  . Pain in both feet 01/17/2016  . Pain in right knee 10/20/2017  . Pain of left hip joint 11/26/2017  . Paroxysmal SVT (supraventricular tachycardia) ([place]) 11/16/2014  . Precordial pain 10/15/2014  . PVC's (premature  ventricular contractions) 04/22/2016  . RBBB 10/15/2014  . S/P lumbar fusion 04/24/2017  . Sacroiliac joint pain 09/10/2018  . Sleep disturbances 01/17/2016  . Snoring 01/17/2016  . Somatic dysfunction of left sacroiliac joint 06/19/2018  . Subscapularis (muscle) sprain 03/29/2011  . 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 CARDIAC CATHETERIZATION  10-2010   ptca/stent in [place]  . CARDIAC CATHETERIZATION  04/23/2016  . CORONARY ANGIOPLASTY    . EYE SURGERY  05-1010   bil cataract ,iol  . FRACTURE SURGERY    . I & D EXTREMITY Right 02/23/2015   Procedure: RIGHT RING FINGER REVISION AMPUTATION;  Surgeon: Leanora Cover, MD;  Location: [facility];  Service: Orthopedics;  Laterality: Right;  . KNEE ARTHROSCOPY W/ MENISCAL REPAIR  2000   left knee  . LUMBAR EPIDURAL INJECTION  2011   low back pain; DDD  . ring finger tramatic qamputation Right   . SHOULDER ARTHROSCOPY  03/29/2011   Procedure: ARTHROSCOPY SHOULDER;  Surgeon: Augustin Schooling;  Location: [place] [facility];  Service: Orthopedics;  Laterality: Right;  RIGHT SHOULDER ARTHROSCOPY WITH OPEN SUBSCAPULAR REPAIR  . SHOULDER OPEN ROTATOR CUFF REPAIR     3 on rt shoulder; 2 on left  . TRANSFORAMINAL  LUMBAR INTERBODY FUSION (TLIF) WITH PEDICLE SCREW FIXATION 1 LEVEL N/A 04/24/2017   Procedure: TRANSFORAMINAL LUMBAR INTERBODY FUSION (TLIF) L4-5;  Surgeon: Melina Schools, MD;  Location: [facility];  Service: Orthopedics;  Laterality: N/A;  4 hrs    Current Medications: Current Meds  Medication Sig  . acetaminophen (TYLENOL) 500 MG tablet Take 1,000 mg by mouth at bedtime as needed.   . clopidogrel (PLAVIX) 75 MG tablet TAKE 1 TABLET(75 MG) BY MOUTH DAILY  . Co-Enzyme Q-10 30 MG CAPS Take 100 mg by mouth daily.  . Cyanocobalamin (B-12 PO) Take 1 tablet by mouth daily.  . DULoxetine (CYMBALTA) 30 MG capsule Take 30 mg by mouth 2 (two) times daily.  Marland Kitchen EPINEPHrine (EPIPEN 2-PAK) 0.3 mg/0.3 mL IJ SOAJ  injection   . esomeprazole (NEXIUM) 40 MG capsule TAKE 1 CAPSULE BY MOUTH DAILY BEFORE BREAKFAST  . ezetimibe (ZETIA) 10 MG tablet Take 1 tablet (10 mg total) by mouth every evening.  . finasteride (PROSCAR) 5 MG tablet Take 5 mg by mouth daily.  . Flaxseed, Linseed, (FLAXSEED OIL MAX STR) 1300 MG CAPS Take 1,300 mg by mouth daily.  . furosemide (LASIX) 80 MG tablet ALTERNATE 1/2 TABLET DAILY WITH 1 TABLET EVERY OTHER DAY  . gemfibrozil (LOPID) 600 MG tablet TAKE 1/2 TABLET BY MOUTH TWICE DAILY BEFORE A MEAL  . Glucosamine HCl (GLUCOSAMINE PO) Take 1 tablet by mouth daily.  . isosorbide mononitrate (IMDUR) 60 MG 24 hr tablet TAKE 1 TABLET(60 MG) BY MOUTH DAILY  . metoprolol succinate (TOPROL-XL) 25 MG 24 hr tablet TAKE 1 TABLET(25 MG) BY MOUTH TWICE DAILY  . minocycline (MINOCIN,DYNACIN) 50 MG capsule TAKE 1 CAPSULE BY MOUTH QD  . Misc Natural Products (PUMPKIN SEED OIL) CAPS Take 1 capsule by mouth daily.  . Multiple Vitamins-Minerals (MULTIVITAMINS THER. W/MINERALS) TABS Take 1 tablet by mouth daily.    . nitroGLYCERIN (NITROSTAT) 0.4 MG SL tablet Place 1 tablet (0.4 mg total) under the tongue every 5 (five) minutes as needed for chest pain. TAKE 1 TABLET UNDER THE TONGUE AS NEEDED AS DIRECTED FOR CHEST PAIN.  Marland Kitchen Omega-3 Fatty Acids (FISH OIL PO) Take 1 tablet by mouth daily.  . potassium chloride SA (KLOR-CON) 20 MEQ tablet TAKE 1 TABLET BY MOUTH TWICE DAILY  . simvastatin (ZOCOR) 40 MG tablet TAKE 1 TABLET(40 MG) BY MOUTH AT BEDTIME  . tamsulosin (FLOMAX) 0.4 MG CAPS capsule Take 0.4 mg by mouth daily.  . traZODone (DESYREL) 50 MG tablet Take 50 mg by mouth at bedtime.  . vitamin E 400 UNIT capsule Take 400 Units by mouth daily.  Alveda Reasons 20 MG TABS tablet TAKE 1 TABLET(20 MG) BY MOUTH DAILY WITH SUPPER     Allergies:   Sotalol hcl, Buprenorphine hcl, Codeine, Hydrocodone, and Percocet [oxycodone-acetaminophen]   Social History   Socioeconomic History  . Marital status: Married     Spouse name: Not on file  . Number of children: Not on file  . Years of education: Not on file  . Highest education level: Not on file  Occupational History  . Not on file  Tobacco Use  . Smoking status: Former Smoker    Packs/day: 1.00    Years: 20.00    Pack years: 20.00    Types: Cigarettes    Quit date: 03/20/1994    Years since quitting: 26.0  . Smokeless tobacco: Former Systems developer    Types: [place] date: 03/20/1996  Vaping Use  . Vaping Use: Never used  Substance and Sexual Activity  . Alcohol use: Yes    Alcohol/week: 6.0 standard drinks    Types: 6 Cans of beer per week  . Drug use: No  . Sexual activity: Not on file  Other Topics Concern  . Not on file  Social History Narrative  . Not on file   Social Determinants of Health   Financial Resource Strain:   . Difficulty of Paying Living Expenses: Not on file  Food Insecurity:   . Worried About Charity fundraiser in the Last Year: Not on file  . Ran Out of Food in the Last Year: Not on file  Transportation Needs:   . Lack of Transportation (Medical): Not on file  . Lack of Transportation (Non-Medical): Not on file  Physical Activity:   . Days of Exercise per Week: Not on file  . Minutes of Exercise per Session: Not on file  Stress:   . Feeling of Stress : Not on file  Social Connections:   . Frequency of Communication with Friends and Family: Not on file  . Frequency of Social Gatherings with Friends and Family: Not on file  . Attends Religious Services: Not on file  . Active Member of Clubs or Organizations: Not on file  . Attends Archivist Meetings: Not on file  . Marital Status: Not on file     Family History: The patient's family history includes Cancer in his father and sister; Heart disease in his father and mother. ROS:   Please see the history of present illness.    All other systems reviewed and are negative.  EKGs/Labs/Other Studies Reviewed:    The following studies were  reviewed today:  EKG:  EKG ordered today and personally reviewed.  The ekg ordered today demonstrates atrial fibrillation rapid rate 117 bpm right bundle branch block  Recent Labs: 07/26/2019: Hemoglobin 14.3; Platelets 221 12/13/2019: BUN 17; Creatinine, Ser 1.21; Potassium 4.4; Sodium 141  Recent Lipid Panel    Component Value Date/Time   CHOL 152 04/08/2018 0921   TRIG 198 (H) 04/08/2018 0921   HDL 48 04/08/2018 0921   CHOLHDL 3.2 04/08/2018 0921   LDLCALC 64 04/08/2018 0921    Physical Exam:    VS:  Ht 5\' 7"  (1.702 m)   Wt 223 lb 6.4 oz (101.3 kg)   BMI 34.99 kg/m     Wt Readings from Last 3 Encounters:  03/20/20 223 lb 6.4 oz (101.3 kg)  12/13/19 222 lb (100.7 kg)  11/01/19 221 lb 12.8 oz (100.6 kg)     GEN:  Well nourished, well developed in no acute distress HEENT: Normal NECK: No JVD; No carotid bruits LYMPHATICS: No lymphadenopathy CARDIAC: S1 variable irregular rapid no murmurs, rubs, gallops RESPIRATORY:  Clear to auscultation without rales, wheezing or rhonchi  ABDOMEN: Soft, non-tender, non-distended MUSCULOSKELETAL:  No edema; No deformity  SKIN: Warm and dry NEUROLOGIC:  Alert and oriented x 3 PSYCHIATRIC:  Normal affect    Signed, Shirlee More, MD  03/20/2020 9:13 AM    [place]

## 2020-03-20 ENCOUNTER — Ambulatory Visit (INDEPENDENT_AMBULATORY_CARE_PROVIDER_SITE_OTHER): Payer: Medicare Other | Admitting: Cardiology

## 2020-03-20 ENCOUNTER — Encounter: Payer: Self-pay | Admitting: Cardiology

## 2020-03-20 ENCOUNTER — Other Ambulatory Visit: Payer: Self-pay

## 2020-03-20 VITALS — BP 118/73 | HR 117 | Ht 67.0 in | Wt 223.4 lb

## 2020-03-20 DIAGNOSIS — I110 Hypertensive heart disease with heart failure: Secondary | ICD-10-CM

## 2020-03-20 DIAGNOSIS — I25119 Atherosclerotic heart disease of native coronary artery with unspecified angina pectoris: Secondary | ICD-10-CM

## 2020-03-20 DIAGNOSIS — Z7901 Long term (current) use of anticoagulants: Secondary | ICD-10-CM

## 2020-03-20 DIAGNOSIS — Z79899 Other long term (current) drug therapy: Secondary | ICD-10-CM

## 2020-03-20 DIAGNOSIS — E782 Mixed hyperlipidemia: Secondary | ICD-10-CM

## 2020-03-20 DIAGNOSIS — I484 Atypical atrial flutter: Secondary | ICD-10-CM

## 2020-03-20 MED ORDER — DIGOXIN 250 MCG PO TABS: 0.2500 mg | ORAL_TABLET | Freq: Every day | ORAL | 3 refills | Status: DC

## 2020-03-20 NOTE — Patient Instructions (Signed)
Medication Instructions:  Your physician has recommended you make the following change in your medication:  START: Digoxin .25 mg take one tablet by mouth daily.  *If you need a refill on your cardiac medications before your next appointment, please call your pharmacy*   Lab Work: Your physician recommends that you return for lab work in: 1 WEEK BMP, Digoxin, ProBNP If you have labs (blood work) drawn today and your tests are completely normal, you will receive your results only by: Marland Kitchen MyChart Message (if you have MyChart) OR . A paper copy in the mail If you have any lab test that is abnormal or we need to change your treatment, we will call you to review the results.   Testing/Procedures: None   Follow-Up: At [facility], you and your health needs are our priority.  As part of our continuing mission to provide you with exceptional heart care, we have created designated Provider Care Teams.  These Care Teams include your primary Cardiologist (physician) and Advanced Practice Providers (APPs -  Physician Assistants and Nurse Practitioners) who all work together to provide you with the care you need, when you need it.  We recommend signing up for the patient portal called "MyChart".  Sign up information is provided on this After Visit Summary.  MyChart is used to connect with patients for Virtual Visits (Telemedicine).  Patients are able to view lab/test results, encounter notes, upcoming appointments, etc.  Non-urgent messages can be sent to your provider as well.   To learn more about what you can do with MyChart, go to [email or web address].    Your next appointment:   3 week(s)  The format for your next appointment:   In Person  Provider:   Shirlee More, MD   Other Instructions

## 2020-03-27 DIAGNOSIS — Z7901 Long term (current) use of anticoagulants: Secondary | ICD-10-CM | POA: Diagnosis not present

## 2020-03-27 DIAGNOSIS — Z79899 Other long term (current) drug therapy: Secondary | ICD-10-CM | POA: Diagnosis not present

## 2020-03-27 DIAGNOSIS — I110 Hypertensive heart disease with heart failure: Secondary | ICD-10-CM | POA: Diagnosis not present

## 2020-03-27 DIAGNOSIS — I484 Atypical atrial flutter: Secondary | ICD-10-CM | POA: Diagnosis not present

## 2020-03-27 DIAGNOSIS — E782 Mixed hyperlipidemia: Secondary | ICD-10-CM | POA: Diagnosis not present

## 2020-03-27 DIAGNOSIS — I25119 Atherosclerotic heart disease of native coronary artery with unspecified angina pectoris: Secondary | ICD-10-CM | POA: Diagnosis not present

## 2020-03-28 LAB — BASIC METABOLIC PANEL
BUN/Creatinine Ratio: 21 (ref 10–24)
BUN: 17 mg/dL (ref 8–27)
CO2: 25 mmol/L (ref 20–29)
Calcium: 9.4 mg/dL (ref 8.6–10.2)
Chloride: 102 mmol/L (ref 96–106)
Creatinine, Ser: 0.81 mg/dL (ref 0.76–1.27)
GFR calc Af Amer: 99 mL/min/{1.73_m2} (ref >59)
GFR calc non Af Amer: 86 mL/min/{1.73_m2} (ref >59)
Glucose: 98 mg/dL (ref 65–99)
Potassium: 4.8 mmol/L (ref 3.5–5.2)
Sodium: 141 mmol/L (ref 134–144)

## 2020-03-28 LAB — DIGOXIN LEVEL: Digoxin, Serum: <0.4 ng/mL — ABNORMAL LOW (ref 0.5–0.9)

## 2020-03-28 LAB — PRO B NATRIURETIC PEPTIDE: NT-Pro BNP: 77 pg/mL (ref 0–486)

## 2020-04-09 ENCOUNTER — Other Ambulatory Visit: Payer: Self-pay | Admitting: Cardiology

## 2020-04-12 NOTE — Progress Notes (Signed)
Cardiology Office Note:    Date:  04/13/2020   ID:  Andrew Hardy., DOB 03/13/1941, MRN [id number]  PCP:  Cyndi Bender, PA-C  Cardiologist:  Shirlee More, MD    Referring MD: Practice, Darel Hong*    ASSESSMENT:    1. Hypertensive heart disease with heart failure (HCC)   2. Persistent atrial fibrillation ([place])   3. Mixed hyperlipidemia   4. High risk medication use   5. Chronic anticoagulation   6. Shortness of breath    PLAN:    In order of problems listed above:  1. Andrew Hardy is not doing well he is obviously has decompensated heart failure increasing shortness of breath and nonproductive cough.  Check chest x-ray I suspect this is all heart failure we will check labs including renal function proBNP and digoxin level.  We will increase his diuretic from the current 80 alternating with 40-80 in the morning and 40 in the afternoon and call the office Monday and speak with my nurse if unimproved will need to be worked into the office if improved to make a decision reviewing his chart at that time. 2. Continue current treatment beta-blocker digoxin anticoagulant 3. Check digoxin level 4. Continue anticoagulant 5. Check proBNP chest x-ray no fever sputum production he is fully vaccinated and I think he requires a Covid test   Next appointment: 2 weeks   Medication Adjustments/Labs and Tests Ordered: Current medicines are reviewed at length with the patient today.  Concerns regarding medicines are outlined above.  No orders of the defined types were placed in this encounter.  No orders of the defined types were placed in this encounter.   Chief Complaint  Patient presents with  . dry cough  . Follow-up  . Atrial Fibrillation    History of Present Illness:    Andrew Hardy. is a 79 y.o. male with a hx of CAD non-ST elevation MI January 2018 with drug-eluting stent to left circumflex, drug-eluting stent to LAD 2018 with plain old balloon angioplasty of first diagonal  branch, paroxysmal atrial fibrillation and flutter hypertensive heart disease with heart failure and dyslipidemia.  He was last seen 03/20/2020 and found to be in atrial fibrillation rapid range and digoxin was added for rate control. Compliance with diet, lifestyle and medications: Yes  He takes his diuretic most days but does miss it on days that he is out of the home perhaps once a week. 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  Surprisingly he has no orthopnea chest pain palpitation.  Initial blood pressure was 90 systolic I rechecked and got 120/80. Past Medical History:  Diagnosis Date  . Anxiety   . Arthritis   . Atypical atrial flutter ([place]) 12/14/2014  . Bradycardia, sinus 10/15/2014  . Burning sensation of feet 01/17/2016  . CAD, multiple vessel 10/18/2014   Overview:  DES to [facility] for unstable angina 10/17/14 Diagnostic Summary Severe stenosis of mid Cx likely cause of recent accelerating angina Diffuse severe stenosis of small caliber LAD unchanged from 2012 Normal LV function Paroxysmal SVT noted during procedure 04/23/16:  PCI / Resolute Drug Eluting Stent of the proximal Left Anterior Descending Coronary Artery. Successful PTCA of the ostial 1st Diagonal Coronary Artery  . Chronic left-sided low back pain  with sciatica 02/21/2016  . Coronary artery disease 2003   s/p MI and numerous stents  . Coronary artery disease involving native coronary artery of native heart with angina pectoris ([place]) 10/18/2014   Overview:  DES to [facility] for unstable angina 10/17/14 Diagnostic Summary Severe stenosis of mid Cx likely cause of recent accelerating angina Diffuse severe stenosis of small caliber LAD unchanged from 2012 Normal LV function Paroxysmal SVT noted during procedure 04/23/16:  PCI  / Resolute Drug Eluting Stent of the proximal Left Anterior Descending Coronary Artery. Successful PTCA of the ostial 1st Di  . 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 Degenerative spondylolisthesis 05/02/2017  . Depression   . Enlarged prostate without lower urinary tract symptoms ([place]) 2012   frequent urination and nocturia; sees dr [name] Late in [place]  . Essential hypertension 10/15/2014  . Gastroesophageal reflux disease without esophagitis 04/21/2016  . GERD (gastroesophageal reflux disease)   . Hyperlipidemia   . Hypertension   . Hypertensive heart disease with heart failure ([place]) 12/20/2016  . IHD (ischemic heart disease) 10/15/2014  . Lower limb pain, inferior, right 12/17/2017  . Lumbar radiculopathy 02/21/2016  . Myocardial infarction ([place]) 2003  . NSTEMI (non-ST elevated myocardial infarction) ([place]) 04/24/2016   Overview:  LHC 04/24/15: Severe stenosis of the LAD & Diagonal bifurcation  LV ejection fraction is 55-60 %  Interventional Summary Successful Angiosculpt PCI / Resolute Drug Eluting Stent of the proximal LAD Successful PTCA of the ostial 1st Diagonal Coronary Artery  . Pain in both feet 01/17/2016  . Pain in right knee 10/20/2017  . Pain of left hip joint 11/26/2017  . Paroxysmal SVT (supraventricular tachycardia) ([place]) 11/16/2014  . Precordial pain 10/15/2014  . PVC's (premature ventricular contractions) 04/22/2016  . RBBB 10/15/2014  . S/P lumbar fusion 04/24/2017  . Sacroiliac joint pain 09/10/2018  . Sleep disturbances 01/17/2016  . Snoring 01/17/2016  . Somatic dysfunction of left sacroiliac joint 06/19/2018  . Subscapularis (muscle) sprain 03/29/2011  . 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 CARDIAC CATHETERIZATION  10-2010   ptca/stent in [place]  . CARDIAC CATHETERIZATION  04/23/2016  . CORONARY ANGIOPLASTY    . EYE SURGERY  05-1010   bil cataract ,iol  . FRACTURE SURGERY    . I & D  EXTREMITY Right 02/23/2015   Procedure: RIGHT RING FINGER REVISION AMPUTATION;  Surgeon: Leanora Cover, MD;  Location: [place];  Service: Orthopedics;  Laterality: Right;  . KNEE ARTHROSCOPY W/ MENISCAL REPAIR  2000   left knee  . LUMBAR EPIDURAL INJECTION  2011   low back pain; DDD  . ring finger tramatic qamputation Right   . SHOULDER ARTHROSCOPY  03/29/2011   Procedure: ARTHROSCOPY SHOULDER;  Surgeon: Augustin Schooling;  Location: [place];  Service: Orthopedics;  Laterality: Right;  RIGHT SHOULDER ARTHROSCOPY WITH OPEN SUBSCAPULAR REPAIR  . SHOULDER OPEN ROTATOR CUFF REPAIR     3 on rt shoulder; 2 on left  . TRANSFORAMINAL LUMBAR INTERBODY FUSION (TLIF) WITH PEDICLE SCREW FIXATION 1 LEVEL N/A 04/24/2017   Procedure: TRANSFORAMINAL LUMBAR INTERBODY FUSION (TLIF) L4-5;  Surgeon: Melina Schools, MD;  Location: [place];  Service: Orthopedics;  Laterality: N/A;  4 hrs    Current Medications: Current Meds  Medication Sig  . acetaminophen (TYLENOL) 500 MG tablet Take 1,000 mg by mouth at bedtime as needed.   . clopidogrel (PLAVIX) 75 MG tablet TAKE 1 TABLET(75 MG) BY MOUTH DAILY  .  Co-Enzyme Q-10 30 MG CAPS Take 100 mg by mouth daily.  . Cyanocobalamin (B-12 PO) Take 1 tablet by mouth daily.  . digoxin (LANOXIN) 0.25 MG tablet Take 1 tablet (0.25 mg total) by mouth daily.  Marland Kitchen EPINEPHrine 0.3 mg/0.3 mL IJ SOAJ injection   . esomeprazole (NEXIUM) 40 MG capsule TAKE 1 CAPSULE BY MOUTH DAILY BEFORE BREAKFAST  . ezetimibe (ZETIA) 10 MG tablet Take 1 tablet (10 mg total) by mouth every evening.  . finasteride (PROSCAR) 5 MG tablet Take 5 mg by mouth daily.  . Flaxseed, Linseed, (FLAXSEED OIL MAX STR) 1300 MG CAPS Take 1,300 mg by mouth daily.  . furosemide (LASIX) 80 MG tablet ALTERNATE 1/2 TABLET DAILY WITH 1 TABLET EVERY OTHER DAY  . gemfibrozil (LOPID) 600 MG tablet TAKE 1/2 TABLET BY MOUTH TWICE DAILY BEFORE A MEAL  . Glucosamine HCl (GLUCOSAMINE PO) Take 1 tablet by mouth daily.  . isosorbide mononitrate  (IMDUR) 60 MG 24 hr tablet TAKE 1 TABLET(60 MG) BY MOUTH DAILY  . metoprolol succinate (TOPROL-XL) 25 MG 24 hr tablet TAKE 1 TABLET(25 MG) BY MOUTH TWICE DAILY  . minocycline (MINOCIN,DYNACIN) 50 MG capsule TAKE 1 CAPSULE BY MOUTH QD  . Misc Natural Products (PUMPKIN SEED OIL) CAPS Take 1 capsule by mouth daily.  . Multiple Vitamins-Minerals (MULTIVITAMINS THER. W/MINERALS) TABS Take 1 tablet by mouth daily.  . nitroGLYCERIN (NITROSTAT) 0.4 MG SL tablet Place 1 tablet (0.4 mg total) under the tongue every 5 (five) minutes as needed for chest pain. TAKE 1 TABLET UNDER THE TONGUE AS NEEDED AS DIRECTED FOR CHEST PAIN.  Marland Kitchen Omega-3 Fatty Acids (FISH OIL PO) Take 1 tablet by mouth daily.  . potassium chloride SA (KLOR-CON) 20 MEQ tablet TAKE 1 TABLET BY MOUTH TWICE DAILY  . simvastatin (ZOCOR) 40 MG tablet TAKE 1 TABLET(40 MG) BY MOUTH AT BEDTIME  . tamsulosin (FLOMAX) 0.4 MG CAPS capsule Take 0.4 mg by mouth daily.  . traZODone (DESYREL) 50 MG tablet Take 50 mg by mouth at bedtime.  . vitamin E 400 UNIT capsule Take 400 Units by mouth daily.  Andrew Hardy 20 MG TABS tablet TAKE 1 TABLET(20 MG) BY MOUTH DAILY WITH SUPPER     Allergies:   Sotalol hcl, Buprenorphine hcl, Codeine, Hydrocodone, and Percocet [oxycodone-acetaminophen]   Social History   Socioeconomic History  . Marital status: Married    Spouse name: Not on file  . Number of children: Not on file  . Years of education: Not on file  . Highest education level: Not on file  Occupational History  . Not on file  Tobacco Use  . Smoking status: Former Smoker    Packs/day: 1.00    Years: 20.00    Pack years: 20.00    Types: Cigarettes    Quit date: 03/20/1994    Years since quitting: 26.0  . Smokeless tobacco: Former Systems developer    Types: Diggins date: 03/20/1996  Vaping Use  . Vaping Use: Never used  Substance and Sexual Activity  . Alcohol use: Yes    Alcohol/week: 6.0 standard drinks    Types: 6 Cans of beer per week  . Drug  use: No  . Sexual activity: Not on file  Other Topics Concern  . Not on file  Social History Narrative  . Not on file   Social Determinants of Health   Financial Resource Strain: Not on file  Food Insecurity: Not on file  Transportation Needs: Not on file  Physical Activity:  Not on file  Stress: Not on file  Social Connections: Not on file     Family History: The patient's family history includes Cancer in his father and sister; Heart disease in his father and mother. ROS:   Please see the history of present illness.    All other systems reviewed and are negative.  EKGs/Labs/Other Studies Reviewed:    The following studies were reviewed today:  EKG:  EKG ordered today and personally reviewed.  The ekg ordered today demonstrates atrial fibrillation 111 bpm right bundle branch block digitalis ST changes  Recent Labs: 07/26/2019: Hemoglobin 14.3; Platelets 221 03/27/2020: BUN 17; Creatinine, Ser 0.81; NT-Pro BNP 77; Potassium 4.8; Sodium 141  Recent Lipid Panel    Component Value Date/Time   CHOL 152 04/08/2018 0921   TRIG 198 (H) 04/08/2018 0921   HDL 48 04/08/2018 0921   CHOLHDL 3.2 04/08/2018 0921   LDLCALC 64 04/08/2018 0921    Physical Exam:    VS:  BP 120/80   Pulse (!) 110   Ht 5\' 7"  (1.702 m)   Wt 220 lb (99.8 kg)   SpO2 99%   BMI 34.46 kg/m     Wt Readings from Last 3 Encounters:  04/13/20 220 lb (99.8 kg)  03/20/20 223 lb 6.4 oz (101.3 kg)  12/13/19 222 lb (100.7 kg)     GEN: Coughing in the office well nourished, well developed in no acute distress HEENT: Normal NECK: No JVD; No carotid bruits LYMPHATICS: No lymphadenopathy CARDIAC: Rapid irregular S1 variable RRR, no murmurs, rubs, gallops RESPIRATORY:  Clear to auscultation without rales, wheezing or rhonchi  ABDOMEN: Soft, non-tender, non-distended MUSCULOSKELETAL:  No edema; No deformity  SKIN: Warm and dry NEUROLOGIC:  Alert and oriented x 3 PSYCHIATRIC:  Normal affect    Signed, Shirlee More, MD  04/13/2020 3:08 PM    [facility]

## 2020-04-13 ENCOUNTER — Encounter: Payer: Self-pay | Admitting: Cardiology

## 2020-04-13 ENCOUNTER — Other Ambulatory Visit: Payer: Self-pay

## 2020-04-13 ENCOUNTER — Ambulatory Visit (INDEPENDENT_AMBULATORY_CARE_PROVIDER_SITE_OTHER): Payer: Medicare Other | Admitting: Cardiology

## 2020-04-13 VITALS — BP 120/80 | HR 110 | Ht 67.0 in | Wt 220.0 lb

## 2020-04-13 DIAGNOSIS — E782 Mixed hyperlipidemia: Secondary | ICD-10-CM | POA: Diagnosis not present

## 2020-04-13 DIAGNOSIS — I110 Hypertensive heart disease with heart failure: Secondary | ICD-10-CM | POA: Diagnosis not present

## 2020-04-13 DIAGNOSIS — I4819 Other persistent atrial fibrillation: Secondary | ICD-10-CM | POA: Diagnosis not present

## 2020-04-13 DIAGNOSIS — R0602 Shortness of breath: Secondary | ICD-10-CM

## 2020-04-13 DIAGNOSIS — Z7901 Long term (current) use of anticoagulants: Secondary | ICD-10-CM

## 2020-04-13 DIAGNOSIS — Z79899 Other long term (current) drug therapy: Secondary | ICD-10-CM

## 2020-04-13 MED ORDER — FUROSEMIDE 80 MG PO TABS
80.0000 mg | ORAL_TABLET | Freq: Every morning | ORAL | 0 refills | Status: DC
Start: 2020-04-13 — End: 2020-07-10

## 2020-04-13 MED ORDER — POTASSIUM CHLORIDE CRYS ER 20 MEQ PO TBCR
40.0000 meq | EXTENDED_RELEASE_TABLET | Freq: Every morning | ORAL | 2 refills | Status: DC
Start: 2020-04-13 — End: 2020-07-24

## 2020-04-13 NOTE — Patient Instructions (Signed)
Medication Instructions:  Take 80 mg of Lasix when you arrive home today.  Tomorrow and thereafter take 80 mg in the morning and 40 mg at night.  Increase Potassium to 40 mEq in the morning and 20 mEq at night.   *If you need a refill on your cardiac medications before your next appointment, please call your pharmacy*   Lab Work Your physician recommends that you have labs today in the office. We done a BMP, Pro BNP, Digoxin.   If you have labs (blood work) drawn today and your tests are completely normal, you will receive your results only by: Marland Kitchen MyChart Message (if you have MyChart) OR . A paper copy in the mail If you have any lab test that is abnormal or we need to change your treatment, we will call you to review the results.   Testing/Procedures: Chest X-Ray   Follow-Up: At [facility], you and your health needs are our priority.  As part of our continuing mission to provide you with exceptional heart care, we have created designated Provider Care Teams.  These Care Teams include your primary Cardiologist (physician) and Advanced Practice Providers (APPs -  Physician Assistants and Nurse Practitioners) who all work together to provide you with the care you need, when you need it.  We recommend signing up for the patient portal called "MyChart".  Sign up information is provided on this After Visit Summary.  MyChart is used to connect with patients for Virtual Visits (Telemedicine).  Patients are able to view lab/test results, encounter notes, upcoming appointments, etc.  Non-urgent messages can be sent to your provider as well.   To learn more about what you can do with MyChart, go to [email or web address].    Your next appointment:   2 week(s)  The format for your next appointment:   In Person  Provider:   Shirlee More, MD   Other Instructions

## 2020-04-14 LAB — BASIC METABOLIC PANEL
BUN/Creatinine Ratio: 15 (ref 10–24)
BUN: 19 mg/dL (ref 8–27)
CO2: 27 mmol/L (ref 20–29)
Calcium: 9.2 mg/dL (ref 8.6–10.2)
Chloride: 104 mmol/L (ref 96–106)
Creatinine, Ser: 1.28 mg/dL — ABNORMAL HIGH (ref 0.76–1.27)
GFR calc Af Amer: 62 mL/min/{1.73_m2} (ref >59)
GFR calc non Af Amer: 54 mL/min/{1.73_m2} — ABNORMAL LOW (ref >59)
Glucose: 115 mg/dL — ABNORMAL HIGH (ref 65–99)
Potassium: 3.6 mmol/L (ref 3.5–5.2)
Sodium: 145 mmol/L — ABNORMAL HIGH (ref 134–144)

## 2020-04-14 LAB — DIGOXIN LEVEL: Digoxin, Serum: 1 ng/mL — ABNORMAL HIGH (ref 0.5–0.9)

## 2020-04-14 LAB — PRO B NATRIURETIC PEPTIDE: NT-Pro BNP: 521 pg/mL — ABNORMAL HIGH (ref 0–486)

## 2020-04-17 ENCOUNTER — Telehealth: Payer: Self-pay

## 2020-04-17 NOTE — Telephone Encounter (Signed)
Left message on patients voicemail to please return our call.   

## 2020-04-18 ENCOUNTER — Inpatient Hospital Stay (HOSPITAL_BASED_OUTPATIENT_CLINIC_OR_DEPARTMENT_OTHER)
Admission: EM | Admit: 2020-04-18 | Discharge: 2020-04-24 | DRG: 378 | Disposition: A | Discharge status: Home / Self Care | Payer: Medicare Other | Attending: Internal Medicine | Admitting: Internal Medicine

## 2020-04-18 ENCOUNTER — Telehealth: Payer: Self-pay

## 2020-04-18 ENCOUNTER — Other Ambulatory Visit: Payer: Self-pay

## 2020-04-18 ENCOUNTER — Emergency Department (HOSPITAL_BASED_OUTPATIENT_CLINIC_OR_DEPARTMENT_OTHER): Payer: Medicare Other

## 2020-04-18 ENCOUNTER — Encounter (HOSPITAL_BASED_OUTPATIENT_CLINIC_OR_DEPARTMENT_OTHER): Payer: Self-pay | Admitting: *Deleted

## 2020-04-18 DIAGNOSIS — I4892 Unspecified atrial flutter: Secondary | ICD-10-CM | POA: Diagnosis present

## 2020-04-18 DIAGNOSIS — Z885 Allergy status to narcotic agent status: Secondary | ICD-10-CM

## 2020-04-18 DIAGNOSIS — N179 Acute kidney failure, unspecified: Secondary | ICD-10-CM | POA: Diagnosis present

## 2020-04-18 DIAGNOSIS — Z20822 Contact with and (suspected) exposure to covid-19: Secondary | ICD-10-CM | POA: Diagnosis not present

## 2020-04-18 DIAGNOSIS — K648 Other hemorrhoids: Secondary | ICD-10-CM | POA: Diagnosis present

## 2020-04-18 DIAGNOSIS — Z8249 Family history of ischemic heart disease and other diseases of the circulatory system: Secondary | ICD-10-CM | POA: Diagnosis not present

## 2020-04-18 DIAGNOSIS — Z7901 Long term (current) use of anticoagulants: Secondary | ICD-10-CM

## 2020-04-18 DIAGNOSIS — N1831 Chronic kidney disease, stage 3a: Secondary | ICD-10-CM | POA: Diagnosis present

## 2020-04-18 DIAGNOSIS — D649 Anemia, unspecified: Secondary | ICD-10-CM | POA: Diagnosis not present

## 2020-04-18 DIAGNOSIS — I2510 Atherosclerotic heart disease of native coronary artery without angina pectoris: Secondary | ICD-10-CM | POA: Diagnosis not present

## 2020-04-18 DIAGNOSIS — F32A Depression, unspecified: Secondary | ICD-10-CM | POA: Diagnosis present

## 2020-04-18 DIAGNOSIS — E669 Obesity, unspecified: Secondary | ICD-10-CM | POA: Diagnosis present

## 2020-04-18 DIAGNOSIS — Z981 Arthrodesis status: Secondary | ICD-10-CM

## 2020-04-18 DIAGNOSIS — K5521 Angiodysplasia of colon with hemorrhage: Secondary | ICD-10-CM

## 2020-04-18 DIAGNOSIS — K219 Gastro-esophageal reflux disease without esophagitis: Secondary | ICD-10-CM | POA: Diagnosis present

## 2020-04-18 DIAGNOSIS — K2270 Barrett's esophagus without dysplasia: Secondary | ICD-10-CM | POA: Diagnosis not present

## 2020-04-18 DIAGNOSIS — D122 Benign neoplasm of ascending colon: Secondary | ICD-10-CM | POA: Diagnosis present

## 2020-04-18 DIAGNOSIS — Z888 Allergy status to other drugs, medicaments and biological substances status: Secondary | ICD-10-CM

## 2020-04-18 DIAGNOSIS — I4821 Permanent atrial fibrillation: Secondary | ICD-10-CM | POA: Diagnosis present

## 2020-04-18 DIAGNOSIS — I252 Old myocardial infarction: Secondary | ICD-10-CM | POA: Diagnosis not present

## 2020-04-18 DIAGNOSIS — Z7902 Long term (current) use of antithrombotics/antiplatelets: Secondary | ICD-10-CM | POA: Diagnosis not present

## 2020-04-18 DIAGNOSIS — Z955 Presence of coronary angioplasty implant and graft: Secondary | ICD-10-CM | POA: Diagnosis not present

## 2020-04-18 DIAGNOSIS — R195 Other fecal abnormalities: Secondary | ICD-10-CM

## 2020-04-18 DIAGNOSIS — I5043 Acute on chronic combined systolic (congestive) and diastolic (congestive) heart failure: Secondary | ICD-10-CM | POA: Diagnosis not present

## 2020-04-18 DIAGNOSIS — K2289 Other specified disease of esophagus: Secondary | ICD-10-CM | POA: Diagnosis not present

## 2020-04-18 DIAGNOSIS — Z79899 Other long term (current) drug therapy: Secondary | ICD-10-CM

## 2020-04-18 DIAGNOSIS — K922 Gastrointestinal hemorrhage, unspecified: Secondary | ICD-10-CM | POA: Diagnosis not present

## 2020-04-18 DIAGNOSIS — R5383 Other fatigue: Secondary | ICD-10-CM | POA: Diagnosis not present

## 2020-04-18 DIAGNOSIS — I25119 Atherosclerotic heart disease of native coronary artery with unspecified angina pectoris: Secondary | ICD-10-CM | POA: Diagnosis present

## 2020-04-18 DIAGNOSIS — K921 Melena: Secondary | ICD-10-CM

## 2020-04-18 DIAGNOSIS — N289 Disorder of kidney and ureter, unspecified: Secondary | ICD-10-CM

## 2020-04-18 DIAGNOSIS — I248 Other forms of acute ischemic heart disease: Secondary | ICD-10-CM | POA: Diagnosis present

## 2020-04-18 DIAGNOSIS — I130 Hypertensive heart and chronic kidney disease with heart failure and stage 1 through stage 4 chronic kidney disease, or unspecified chronic kidney disease: Secondary | ICD-10-CM | POA: Diagnosis not present

## 2020-04-18 DIAGNOSIS — I4891 Unspecified atrial fibrillation: Secondary | ICD-10-CM | POA: Diagnosis not present

## 2020-04-18 DIAGNOSIS — Z89021 Acquired absence of right finger(s): Secondary | ICD-10-CM

## 2020-04-18 DIAGNOSIS — Z6833 Body mass index (BMI) 33.0-33.9, adult: Secondary | ICD-10-CM | POA: Diagnosis not present

## 2020-04-18 DIAGNOSIS — D62 Acute posthemorrhagic anemia: Secondary | ICD-10-CM | POA: Diagnosis not present

## 2020-04-18 DIAGNOSIS — E785 Hyperlipidemia, unspecified: Secondary | ICD-10-CM | POA: Diagnosis present

## 2020-04-18 DIAGNOSIS — I5022 Chronic systolic (congestive) heart failure: Secondary | ICD-10-CM | POA: Diagnosis not present

## 2020-04-18 DIAGNOSIS — Z825 Family history of asthma and other chronic lower respiratory diseases: Secondary | ICD-10-CM

## 2020-04-18 DIAGNOSIS — K635 Polyp of colon: Secondary | ICD-10-CM | POA: Diagnosis not present

## 2020-04-18 DIAGNOSIS — K22719 Barrett's esophagus with dysplasia, unspecified: Secondary | ICD-10-CM | POA: Diagnosis present

## 2020-04-18 DIAGNOSIS — I4819 Other persistent atrial fibrillation: Secondary | ICD-10-CM | POA: Diagnosis not present

## 2020-04-18 DIAGNOSIS — R079 Chest pain, unspecified: Secondary | ICD-10-CM | POA: Diagnosis not present

## 2020-04-18 DIAGNOSIS — F419 Anxiety disorder, unspecified: Secondary | ICD-10-CM | POA: Diagnosis present

## 2020-04-18 DIAGNOSIS — Z720 Tobacco use: Secondary | ICD-10-CM

## 2020-04-18 HISTORY — DX: Gastrointestinal hemorrhage, unspecified: K92.2

## 2020-04-18 LAB — CBC
HCT: 27.3 % — ABNORMAL LOW (ref 39.0–52.0)
Hemoglobin: 8.9 g/dL — ABNORMAL LOW (ref 13.0–17.0)
MCH: 29.3 pg (ref 26.0–34.0)
MCHC: 32.6 g/dL (ref 30.0–36.0)
MCV: 89.8 fL (ref 80.0–100.0)
Platelets: 316 10*3/uL (ref 150–400)
RBC: 3.04 MIL/uL — ABNORMAL LOW (ref 4.22–5.81)
RDW: 13.3 % (ref 11.5–15.5)
WBC: 7.8 10*3/uL (ref 4.0–10.5)
nRBC: 0 % (ref 0.0–0.2)

## 2020-04-18 LAB — BASIC METABOLIC PANEL
Anion gap: 12 (ref 5–15)
BUN: 25 mg/dL — ABNORMAL HIGH (ref 8–23)
CO2: 24 mmol/L (ref 22–32)
Calcium: 8.9 mg/dL (ref 8.9–10.3)
Chloride: 98 mmol/L (ref 98–111)
Creatinine, Ser: 1.46 mg/dL — ABNORMAL HIGH (ref 0.61–1.24)
GFR, Estimated: 49 mL/min — ABNORMAL LOW (ref >60)
Glucose, Bld: 152 mg/dL — ABNORMAL HIGH (ref 70–99)
Potassium: 4.1 mmol/L (ref 3.5–5.1)
Sodium: 134 mmol/L — ABNORMAL LOW (ref 135–145)

## 2020-04-18 LAB — URINALYSIS, ROUTINE W REFLEX MICROSCOPIC
Bilirubin Urine: NEGATIVE
Glucose, UA: NEGATIVE mg/dL
Hgb urine dipstick: NEGATIVE
Ketones, ur: NEGATIVE mg/dL
Leukocytes,Ua: NEGATIVE
Nitrite: NEGATIVE
Protein, ur: NEGATIVE mg/dL
Specific Gravity, Urine: 1.015 (ref 1.005–1.030)
pH: 6 (ref 5.0–8.0)

## 2020-04-18 LAB — OCCULT BLOOD X 1 CARD TO LAB, STOOL: Fecal Occult Bld: POSITIVE — AB

## 2020-04-18 LAB — TROPONIN I (HIGH SENSITIVITY)
Troponin I (High Sensitivity): 20 ng/L — ABNORMAL HIGH (ref <18)
Troponin I (High Sensitivity): 21 ng/L — ABNORMAL HIGH (ref <18)

## 2020-04-18 LAB — BRAIN NATRIURETIC PEPTIDE: B Natriuretic Peptide: 107.1 pg/mL — ABNORMAL HIGH (ref 0.0–100.0)

## 2020-04-18 LAB — RESP PANEL BY RT-PCR (FLU A&B, COVID) ARPGX2
Influenza A by PCR: NEGATIVE
Influenza B by PCR: NEGATIVE
SARS Coronavirus 2 by RT PCR: NEGATIVE

## 2020-04-18 MED ORDER — PANTOPRAZOLE SODIUM 40 MG IV SOLR
INTRAVENOUS | Status: AC
  Filled 2020-04-18: qty 80

## 2020-04-18 MED ORDER — NITROGLYCERIN 0.4 MG SL SUBL: 0.4000 mg | SUBLINGUAL_TABLET | SUBLINGUAL | Status: DC | PRN

## 2020-04-18 MED ORDER — TAMSULOSIN HCL 0.4 MG PO CAPS
0.4000 mg | ORAL_CAPSULE | Freq: Once | ORAL | Status: AC
  Administered 2020-04-18: 22:00:00 0.4 mg via ORAL
  Filled 2020-04-18: qty 1

## 2020-04-18 MED ORDER — SIMVASTATIN 20 MG PO TABS
40.0000 mg | ORAL_TABLET | Freq: Every day | ORAL | Status: DC
  Administered 2020-04-18 – 2020-04-23 (×6): 40 mg via ORAL
  Filled 2020-04-18 (×5): qty 2

## 2020-04-18 MED ORDER — SODIUM CHLORIDE 0.9 % IV SOLN
80.0000 mg | Freq: Once | INTRAVENOUS | Status: AC
  Administered 2020-04-18: 17:00:00 80 mg via INTRAVENOUS
  Filled 2020-04-18: qty 80

## 2020-04-18 MED ORDER — EZETIMIBE 10 MG PO TABS
10.0000 mg | ORAL_TABLET | Freq: Every day | ORAL | Status: DC
  Filled 2020-04-18: qty 1

## 2020-04-18 NOTE — ED Provider Notes (Signed)
[facility] EMERGENCY DEPARTMENT Provider Note   CSN: [id number] Arrival date & time: 04/18/20  1301     History Chief Complaint  Patient presents with  . Chest Pain  . Shortness of Breath    Andrew Gamel. is a 79 y.o. male.  HPI   79 year old male with past medical history of HTN, HLD, CAD status post stents, atrial fibrillation presents to the emergency department with shortness of breath and left-sided chest discomfort. Patient states for the last couple weeks he has been short of breath, every day for majority of the day. Worse with exertion. Today he was attempting to chop wood and states when he was exerting himself or bending over he felt really dizzy like he was going to pass out. On arrival he has some mild shortness of breath but he is complaining of active left-sided chest heaviness. Patient does take a daily diuretic, states that this was recently adjusted for increased fluid retention. He has had a dry cough but denies any fever, abdominal pain, vomiting/diarrhea.  Past Medical History:  Diagnosis Date  . Anxiety   . Arthritis   . Atypical atrial flutter (HCC) 12/14/2014  . Bradycardia, sinus 10/15/2014  . Burning sensation of feet 01/17/2016  . CAD, multiple vessel 10/18/2014   Overview:  DES to [facility] for unstable angina 10/17/14 Diagnostic Summary Severe stenosis of mid Cx likely cause of recent accelerating angina Diffuse severe stenosis of small caliber LAD unchanged from 2012 Normal LV function Paroxysmal SVT noted during procedure 04/23/16:  PCI / Resolute Drug Eluting Stent of the proximal Left Anterior Descending Coronary Artery. Successful PTCA of the ostial 1st Diagonal Coronary Artery  . 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 Coronary artery disease 2003   s/p MI and numerous stents  . Coronary artery disease involving native coronary artery of native heart with angina pectoris (HCC) 10/18/2014   Overview:  DES to [facility] for unstable  angina 10/17/14 Diagnostic Summary Severe stenosis of mid Cx likely cause of recent accelerating angina Diffuse severe stenosis of small caliber LAD unchanged from 2012 Normal LV function Paroxysmal SVT noted during procedure 04/23/16:  PCI / Resolute Drug Eluting Stent of the proximal Left Anterior Descending Coronary Artery. Successful PTCA of the ostial 1st Di  . 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 Degenerative spondylolisthesis 05/02/2017  . Depression   . Enlarged prostate without lower urinary tract symptoms (luts) 2012   frequent urination and nocturia; sees dr Oda Cogan in [place]  . Essential hypertension 10/15/2014  . Gastroesophageal reflux disease without esophagitis 04/21/2016  . GERD (gastroesophageal reflux disease)   . Hyperlipidemia   . Hypertension   . 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 IHD (ischemic heart disease) 10/15/2014  . Lower limb pain, inferior, right 12/17/2017  . Lumbar radiculopathy 02/21/2016  . Myocardial infarction (HCC) 2003  . NSTEMI (non-ST elevated myocardial infarction) (HCC) 04/24/2016   Overview:  LHC 04/24/15: Severe stenosis of the LAD & Diagonal bifurcation  LV ejection fraction is 55-60 %  Interventional Summary Successful Angiosculpt PCI / Resolute Drug Eluting Stent of the proximal LAD Successful PTCA of the ostial 1st Diagonal Coronary Artery  . Pain in both feet 01/17/2016  . Pain in right knee 10/20/2017  . Pain of left hip joint 11/26/2017  . Paroxysmal SVT (supraventricular tachycardia) (HCC) 11/16/2014  . Precordial pain 10/15/2014  . PVC's (premature ventricular contractions) 04/22/2016  . RBBB 10/15/2014  . S/P lumbar  fusion 04/24/2017  . Sacroiliac joint pain 09/10/2018  . Sleep disturbances 01/17/2016  . Snoring 01/17/2016  . Somatic dysfunction of left sacroiliac joint 06/19/2018  . Subscapularis (muscle) sprain 03/29/2011  . 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 Hypertension   . Depression   . Anxiety   . Sacroiliac joint pain 09/10/2018  . Somatic dysfunction of left sacroiliac joint 06/19/2018  . Lower limb pain, inferior, right 12/17/2017  . Pain of left hip joint 11/26/2017  . Pain in right knee 10/20/2017  . 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 Degenerative spondylolisthesis 05/02/2017  . S/P lumbar fusion 04/24/2017  . Arthritis 12/20/2016  . Hypertensive heart disease with heart failure ([place]) 12/20/2016  . GERD (gastroesophageal reflux disease)   . NSTEMI (non-ST elevated myocardial infarction) ([place]) 04/24/2016  . PVC's (premature ventricular contractions) 04/22/2016  . Gastroesophageal reflux disease without esophagitis 04/21/2016  . 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 Lumbar radiculopathy 02/21/2016  . Burning sensation of feet 01/17/2016  . Pain in both feet 01/17/2016  . Sleep disturbances 01/17/2016  . Snoring 01/17/2016  . 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 Atypical atrial flutter ([place]) 12/14/2014  . Paroxysmal SVT (supraventricular tachycardia) ([place]) 11/16/2014  . Coronary artery disease involving native coronary artery of native heart with angina pectoris ([place]) 10/18/2014  . Bradycardia, sinus 10/15/2014  . Hyperlipidemia 10/15/2014  . IHD (ischemic heart disease) 10/15/2014  . Precordial pain 10/15/2014  . RBBB 10/15/2014  . Essential hypertension 10/15/2014  . Subscapularis (muscle) sprain 03/29/2011  . Enlarged prostate without lower urinary tract symptoms (luts) 04/22/2010  . CAD, multiple vessel 04/22/2001  . Myocardial infarction ([place]) 04/22/2001  . Coronary artery disease 2003    Past Surgical History:  Procedure Laterality Date  . CARDIAC CATHETERIZATION  10-2010   ptca/stent in [place]  . CARDIAC CATHETERIZATION  04/23/2016  . CORONARY ANGIOPLASTY    . EYE SURGERY  05-1010   bil cataract ,iol  . FRACTURE  SURGERY    . I & D EXTREMITY Right 02/23/2015   Procedure: RIGHT RING FINGER REVISION AMPUTATION;  Surgeon: Leanora Cover, MD;  Location: [place];  Service: Orthopedics;  Laterality: Right;  . KNEE ARTHROSCOPY W/ MENISCAL REPAIR  2000   left knee  . LUMBAR EPIDURAL INJECTION  2011   low back pain; DDD  . ring finger tramatic qamputation Right   . SHOULDER ARTHROSCOPY  03/29/2011   Procedure: ARTHROSCOPY SHOULDER;  Surgeon: Augustin Schooling;  Location: [place];  Service: Orthopedics;  Laterality: Right;  RIGHT SHOULDER ARTHROSCOPY WITH OPEN SUBSCAPULAR REPAIR  . SHOULDER OPEN ROTATOR CUFF REPAIR     3 on rt shoulder; 2 on left  . TRANSFORAMINAL LUMBAR INTERBODY FUSION (TLIF) WITH PEDICLE SCREW FIXATION 1 LEVEL N/A 04/24/2017   Procedure: TRANSFORAMINAL LUMBAR INTERBODY FUSION (TLIF) L4-5;  Surgeon: Melina Schools, MD;  Location: [place];  Service: Orthopedics;  Laterality: N/A;  4 hrs       Family History  Problem Relation Age of Onset  . Heart disease Mother   . Heart disease Father   . Cancer Father   . Cancer Sister     Social History   Tobacco Use  . Smoking status: Former Smoker    Packs/day: 1.00    Years: 20.00    Pack years: 20.00    Types: Cigarettes    Quit date:  03/20/1994    Years since quitting: 26.0  . Smokeless tobacco: Former Systems developer    Types: Coker date: 03/20/1996  Vaping Use  . Vaping Use: Never used  Substance Use Topics  . Alcohol use: Yes    Alcohol/week: 6.0 standard drinks    Types: 6 Cans of beer per week  . Drug use: No    Home Medications Prior to Admission medications   Medication Sig Start Date End Date Taking? Authorizing Provider  acetaminophen (TYLENOL) 500 MG tablet Take 1,000 mg by mouth at bedtime as needed.     [provider]  clopidogrel (PLAVIX) 75 MG tablet TAKE 1 TABLET(75 MG) BY MOUTH DAILY 11/11/19   Richardo Priest, MD  Co-Enzyme Q-10 30 MG CAPS Take 100 mg by mouth daily.    [provider]  Cyanocobalamin (B-12  PO) Take 1 tablet by mouth daily.    [provider]  digoxin (LANOXIN) 0.25 MG tablet Take 1 tablet (0.25 mg total) by mouth daily. 03/20/20   Richardo Priest, MD  EPINEPHrine 0.3 mg/0.3 mL IJ SOAJ injection  08/23/15   [provider]  esomeprazole (NEXIUM) 40 MG capsule TAKE 1 CAPSULE BY MOUTH DAILY BEFORE BREAKFAST 02/09/20   Richardo Priest, MD  ezetimibe (ZETIA) 10 MG tablet Take 1 tablet (10 mg total) by mouth every evening. 06/11/19   Richardo Priest, MD  finasteride (PROSCAR) 5 MG tablet Take 5 mg by mouth daily. 01/17/15   [provider]  Flaxseed, Linseed, (FLAXSEED OIL MAX STR) 1300 MG CAPS Take 1,300 mg by mouth daily.    [provider]  furosemide (LASIX) 80 MG tablet Take 1 tablet (80 mg total) by mouth in the morning. And take 40 mg in the evening. 04/13/20   Richardo Priest, MD  gemfibrozil (LOPID) 600 MG tablet TAKE 1/2 TABLET BY MOUTH TWICE DAILY BEFORE A MEAL 11/11/19   Richardo Priest, MD  Glucosamine HCl (GLUCOSAMINE PO) Take 1 tablet by mouth daily.    [provider]  isosorbide mononitrate (IMDUR) 60 MG 24 hr tablet TAKE 1 TABLET(60 MG) BY MOUTH DAILY 09/13/19   Richardo Priest, MD  metoprolol succinate (TOPROL-XL) 25 MG 24 hr tablet TAKE 1 TABLET(25 MG) BY MOUTH TWICE DAILY 12/10/19   Richardo Priest, MD  minocycline (MINOCIN,DYNACIN) 50 MG capsule TAKE 1 CAPSULE BY MOUTH QD 05/13/17   [provider]  Misc Natural Products (PUMPKIN SEED OIL) CAPS Take 1 capsule by mouth daily.    [provider]  Multiple Vitamins-Minerals (MULTIVITAMINS THER. W/MINERALS) TABS Take 1 tablet by mouth daily.    [provider]  nitroGLYCERIN (NITROSTAT) 0.4 MG SL tablet Place 1 tablet (0.4 mg total) under the tongue every 5 (five) minutes as needed for chest pain. TAKE 1 TABLET UNDER THE TONGUE AS NEEDED AS DIRECTED FOR CHEST PAIN. 03/09/19   Richardo Priest, MD  Omega-3 Fatty Acids (FISH OIL PO) Take 1 tablet by mouth daily.     [provider]  potassium chloride SA (KLOR-CON) 20 MEQ tablet Take 2 tablets (40 mEq total) by mouth in the morning. And take 20 mEq in the evening. 04/13/20   Richardo Priest, MD  simvastatin (ZOCOR) 40 MG tablet TAKE 1 TABLET(40 MG) BY MOUTH AT BEDTIME 12/01/19   Richardo Priest, MD  tamsulosin (FLOMAX) 0.4 MG CAPS capsule Take 0.4 mg by mouth daily.    [provider]  traZODone (DESYREL) 50  MG tablet Take 50 mg by mouth at bedtime. 07/31/19   [provider]  vitamin E 400 UNIT capsule Take 400 Units by mouth daily.    [provider]  XARELTO 20 MG TABS tablet TAKE 1 TABLET(20 MG) BY MOUTH DAILY WITH SUPPER 04/10/20   Richardo Priest, MD    Allergies    Sotalol hcl, Buprenorphine hcl, Codeine, Hydrocodone, and Percocet [oxycodone-acetaminophen]  Review of Systems   Review of Systems  Constitutional: Positive for fatigue. Negative for chills and fever.  HENT: Negative for congestion.   Eyes: Negative for visual disturbance.  Respiratory: Positive for shortness of breath.   Cardiovascular: Positive for chest pain.  Gastrointestinal: Negative for abdominal pain, diarrhea and vomiting.  Genitourinary: Negative for dysuria.  Skin: Negative for rash.  Neurological: Positive for dizziness and light-headedness. Negative for headaches.    Physical Exam Updated Vital Signs BP (!) 136/93 (BP Location: Right Arm)   Pulse 95   Temp 98.3 F (36.8 C) (Oral)   Resp 20   Ht 5\' 7"  (1.702 m)   Wt 99.8 kg   SpO2 100%   BMI 34.46 kg/m   Physical Exam Vitals and nursing note reviewed.  Constitutional:      General: He is not in acute distress.    Appearance: Normal appearance.  HENT:     Head: Normocephalic.     Mouth/Throat:     Mouth: Mucous membranes are moist.  Cardiovascular:     Rate and Rhythm: Normal rate.  Pulmonary:     Effort: Pulmonary effort is normal. No tachypnea or respiratory distress.     Breath sounds: Examination of the  right-lower field reveals decreased breath sounds and rales. Examination of the left-lower field reveals decreased breath sounds and rales. Decreased breath sounds and rales present.  Chest:     Chest wall: No tenderness or crepitus.  Abdominal:     Palpations: Abdomen is soft.     Tenderness: There is no abdominal tenderness.  Musculoskeletal:     Comments: Trace bilateral lower extremity edema  Skin:    General: Skin is warm.  Neurological:     Mental Status: He is alert and oriented to person, place, and time. Mental status is at baseline.  Psychiatric:        Mood and Affect: Mood normal.     ED Results / Procedures / Treatments   Labs (all labs ordered are listed, but only abnormal results are displayed) Labs Reviewed  BASIC METABOLIC PANEL - Abnormal; Notable for the following components:      Result Value   Sodium 134 (*)    Glucose, Bld 152 (*)    BUN 25 (*)    Creatinine, Ser 1.46 (*)    GFR, Estimated 49 (*)    All other components within normal limits  CBC - Abnormal; Notable for the following components:   RBC 3.04 (*)    Hemoglobin 8.9 (*)    HCT 27.3 (*)    All other components within normal limits  TROPONIN I (HIGH SENSITIVITY) - Abnormal; Notable for the following components:   Troponin I (High Sensitivity) 20 (*)    All other components within normal limits  TROPONIN I (HIGH SENSITIVITY)    EKG None  Radiology No results found.  Procedures Procedures (including critical care time)  Medications Ordered in ED Medications - No data to display  ED Course  I have reviewed the triage vital signs and the nursing notes.  Pertinent labs &  imaging results that were available during my care of the patient were reviewed by me and considered in my medical decision making (see chart for details).    MDM Rules/Calculators/A&P                          79 year old male presents emergency department shortness of breath and left-sided chest discomfort. Chest  discomfort is present on arrival. EKG shows tachycardia with right bundle branch block which is baseline for the patient, atrial fibrillation, there does appear to be some ST changes, given his active chest pain I spoke with the on-call STEMI Dr. Dr. Stanford Breed to review the EKG. There is no STEMI criteria.  Patient's blood work shows a new anemia of eights down from a baseline of 13-14.  No reports of black stool however patient has been having scattered episodes of urgency and diarrhea.  Fecal occult has been sent.  Patient is anticoagulated on Xarelto and Plavix.  My high suspicion is that this is an anticoagulated GI bleed with secondary demand ischemic changes causing shortness of breath.  Patient signed out pending full lab evaluation, chest x-ray and fecal occult.  Patient will most likely require admission.    Final Clinical Impression(s) / ED Diagnoses Final diagnoses:  None    Rx / DC Orders ED Discharge Orders    None       Lorelle Gibbs, DO 04/18/20 1538

## 2020-04-18 NOTE — ED Provider Notes (Signed)
  Physical Exam  BP 135/84 (BP Location: Right Arm)   Pulse 79   Temp 98.3 F (36.8 C) (Oral)   Resp 15   Ht 5\' 7"  (1.702 m)   Wt 99.8 kg   SpO2 100%   BMI 34.46 kg/m   Physical Exam  ED Course/Procedures     Procedures  MDM  Care assumed at 3 pm. Patient here with SOB and near syncope. Has significant EKG changes. Cardiology consulted and felt that its not a STEMI. Initial trop is 21. Sign out pending BNP and repeat trop and admission   5:35 PM BNP 107. Repeat trop stable. Guiac positive. CBC showed hg dropped to 8.9. Baseline Hg is 14. Patient is on xarelto for afib and has hx of barrett's esophagus. Patient follows up with GI in [place]. Talked to GI, Dr. . She agreed with protonix and will see patient as a consult for possible upper GI bleed.     Marca Ancona, MD 04/18/20 [id number]

## 2020-04-18 NOTE — Telephone Encounter (Signed)
Spoke with patient regarding results and recommendation.  Patient verbalizes understanding and is agreeable to plan of care. Advised patient to call back with any issues or concerns.  

## 2020-04-18 NOTE — ED Triage Notes (Signed)
Chest pain and SOB x 2 weeks. His MD is aware. His medications were changed with no improvement.

## 2020-04-18 NOTE — Progress Notes (Signed)
[facility] Plan of Care Note  Patient: Andrew Hardy.    JKK:[id number]  PCP: Lonie Peak, Cordelia Poche    DOB: 12-27-1940  DOS: 04/18/2020   Received a phone call from [facility] regarding transfer of Mr. Andrew Hardy. Requesting: Dr. Silverio Lay Reason for transfer: Admission for GI bleed History: Past medical history of CAD SP PCI, HTN, GERD, HLD, anxiety, atrial flutter on Xarelto, BPH, obesity.  Presents with complaints of chest pain and shortness of breath for 2 weeks.  While denies having any bleeding found to have severe anemia with a hemoglobin drop from 14-8 with positive Hemoccult on Xarelto. EKG had some nonspecific ST-T wave changes discussed with Dr. Jens Som not meeting STEMI criteria.  Troponin x2 were also flat. Vitals: Blood pressure stable, on room air Labs: Covid negative, mild elevation of serum creatinine, hemoglobin 14.3 in April 2021, 8.9 this time. Treatment received: Protonix drip initiated, nitroglycerin given with resolution of symptoms. EDP discussed with [facility].  They will see the patient tomorrow.  Patient remains NPO.  Plan of care: The patient will be accepted for admission progressive care unit given anemia with shortness of breath being on Xarelto. Cardiology will need to be consulted in the morning.  Author: Lynden Oxford, MD [facility] 04/18/2020  If 7PM-7AM, please contact night-coverage at [email or web address],

## 2020-04-19 ENCOUNTER — Encounter (HOSPITAL_COMMUNITY): Payer: Self-pay | Admitting: Internal Medicine

## 2020-04-19 DIAGNOSIS — I25119 Atherosclerotic heart disease of native coronary artery with unspecified angina pectoris: Secondary | ICD-10-CM | POA: Diagnosis not present

## 2020-04-19 DIAGNOSIS — I5022 Chronic systolic (congestive) heart failure: Secondary | ICD-10-CM

## 2020-04-19 DIAGNOSIS — K922 Gastrointestinal hemorrhage, unspecified: Principal | ICD-10-CM

## 2020-04-19 DIAGNOSIS — D649 Anemia, unspecified: Secondary | ICD-10-CM | POA: Diagnosis present

## 2020-04-19 DIAGNOSIS — N289 Disorder of kidney and ureter, unspecified: Secondary | ICD-10-CM

## 2020-04-19 HISTORY — DX: Disorder of kidney and ureter, unspecified: N28.9

## 2020-04-19 HISTORY — DX: Chronic systolic (congestive) heart failure: I50.22

## 2020-04-19 HISTORY — DX: Anemia, unspecified: D64.9

## 2020-04-19 LAB — HEMOGLOBIN
Hemoglobin: 7.5 g/dL — ABNORMAL LOW (ref 13.0–17.0)
Hemoglobin: 7.7 g/dL — ABNORMAL LOW (ref 13.0–17.0)

## 2020-04-19 LAB — HEMOGLOBIN AND HEMATOCRIT, BLOOD
HCT: 23.9 % — ABNORMAL LOW (ref 39.0–52.0)
Hemoglobin: 7.4 g/dL — ABNORMAL LOW (ref 13.0–17.0)

## 2020-04-19 LAB — BASIC METABOLIC PANEL
Anion gap: 10 (ref 5–15)
BUN: 20 mg/dL (ref 8–23)
CO2: 25 mmol/L (ref 22–32)
Calcium: 9 mg/dL (ref 8.9–10.3)
Chloride: 102 mmol/L (ref 98–111)
Creatinine, Ser: 1.31 mg/dL — ABNORMAL HIGH (ref 0.61–1.24)
GFR, Estimated: 56 mL/min — ABNORMAL LOW (ref >60)
Glucose, Bld: 123 mg/dL — ABNORMAL HIGH (ref 70–99)
Potassium: 3.5 mmol/L (ref 3.5–5.1)
Sodium: 137 mmol/L (ref 135–145)

## 2020-04-19 LAB — MRSA PCR SCREENING: MRSA by PCR: NEGATIVE

## 2020-04-19 LAB — HEMATOCRIT
HCT: 23.7 % — ABNORMAL LOW (ref 39.0–52.0)
HCT: 25 % — ABNORMAL LOW (ref 39.0–52.0)

## 2020-04-19 MED ORDER — DICLOFENAC SODIUM 1 % EX GEL
2.0000 g | Freq: Four times a day (QID) | CUTANEOUS | Status: DC
  Administered 2020-04-19 – 2020-04-22 (×4): 2 g via TOPICAL
  Filled 2020-04-19: qty 100

## 2020-04-19 MED ORDER — ONDANSETRON HCL 4 MG/2ML IJ SOLN: 4.0000 mg | Freq: Four times a day (QID) | INTRAMUSCULAR | Status: DC | PRN

## 2020-04-19 MED ORDER — SIMETHICONE 80 MG PO CHEW
80.0000 mg | CHEWABLE_TABLET | Freq: Four times a day (QID) | ORAL | Status: DC | PRN
  Administered 2020-04-19: 17:00:00 80 mg via ORAL
  Filled 2020-04-19: qty 1

## 2020-04-19 MED ORDER — FINASTERIDE 5 MG PO TABS
5.0000 mg | ORAL_TABLET | Freq: Every day | ORAL | Status: DC
  Administered 2020-04-19 – 2020-04-23 (×5): 5 mg via ORAL
  Filled 2020-04-19 (×5): qty 1

## 2020-04-19 MED ORDER — ONDANSETRON HCL 4 MG PO TABS: 4.0000 mg | ORAL_TABLET | Freq: Four times a day (QID) | ORAL | Status: DC | PRN

## 2020-04-19 MED ORDER — SODIUM CHLORIDE 0.9 % IV SOLN: 250.0000 mL | INTRAVENOUS | Status: DC | PRN

## 2020-04-19 MED ORDER — ISOSORBIDE MONONITRATE ER 60 MG PO TB24
60.0000 mg | ORAL_TABLET | Freq: Every day | ORAL | Status: DC
  Administered 2020-04-19 – 2020-04-20 (×2): 60 mg via ORAL
  Filled 2020-04-19 (×2): qty 1

## 2020-04-19 MED ORDER — EZETIMIBE 10 MG PO TABS
10.0000 mg | ORAL_TABLET | Freq: Every evening | ORAL | Status: DC
  Administered 2020-04-19 – 2020-04-23 (×5): 10 mg via ORAL
  Filled 2020-04-19 (×5): qty 1

## 2020-04-19 MED ORDER — TAMSULOSIN HCL 0.4 MG PO CAPS
0.4000 mg | ORAL_CAPSULE | Freq: Every day | ORAL | Status: DC
  Administered 2020-04-19 – 2020-04-23 (×5): 0.4 mg via ORAL
  Filled 2020-04-19 (×5): qty 1

## 2020-04-19 MED ORDER — MINOCYCLINE HCL 50 MG PO CAPS
50.0000 mg | ORAL_CAPSULE | Freq: Every day | ORAL | Status: DC
Start: 2020-04-19 — End: 2020-04-24
  Administered 2020-04-19 – 2020-04-23 (×5): 50 mg via ORAL
  Filled 2020-04-19 (×6): qty 1

## 2020-04-19 MED ORDER — DIGOXIN 125 MCG PO TABS
0.2500 mg | ORAL_TABLET | Freq: Every day | ORAL | Status: DC
  Administered 2020-04-19 – 2020-04-23 (×5): 0.25 mg via ORAL
  Filled 2020-04-19 (×5): qty 2

## 2020-04-19 MED ORDER — SIMVASTATIN 20 MG PO TABS: 40.0000 mg | ORAL_TABLET | Freq: Every day | ORAL | Status: DC

## 2020-04-19 MED ORDER — GEMFIBROZIL 600 MG PO TABS
300.0000 mg | ORAL_TABLET | Freq: Two times a day (BID) | ORAL | Status: DC
  Administered 2020-04-19 – 2020-04-23 (×9): 300 mg via ORAL
  Filled 2020-04-19 (×11): qty 0.5

## 2020-04-19 MED ORDER — TRAZODONE HCL 50 MG PO TABS: 50.0000 mg | ORAL_TABLET | Freq: Every day | ORAL | Status: DC

## 2020-04-19 MED ORDER — METOPROLOL SUCCINATE ER 25 MG PO TB24
25.0000 mg | ORAL_TABLET | Freq: Every day | ORAL | Status: DC
  Administered 2020-04-19 – 2020-04-23 (×4): 25 mg via ORAL
  Filled 2020-04-19 (×5): qty 1

## 2020-04-19 MED ORDER — PANTOPRAZOLE SODIUM 40 MG PO TBEC
40.0000 mg | DELAYED_RELEASE_TABLET | Freq: Two times a day (BID) | ORAL | Status: DC
  Administered 2020-04-19 – 2020-04-23 (×9): 40 mg via ORAL
  Filled 2020-04-19 (×9): qty 1

## 2020-04-19 MED ORDER — TRAZODONE HCL 50 MG PO TABS
100.0000 mg | ORAL_TABLET | Freq: Every day | ORAL | Status: DC
  Administered 2020-04-19 – 2020-04-23 (×6): 100 mg via ORAL
  Filled 2020-04-19 (×6): qty 2

## 2020-04-19 MED ORDER — PANTOPRAZOLE SODIUM 40 MG IV SOLR
40.0000 mg | Freq: Two times a day (BID) | INTRAVENOUS | Status: DC
  Administered 2020-04-19: 01:00:00 40 mg via INTRAVENOUS
  Filled 2020-04-19: qty 40

## 2020-04-19 MED ORDER — ACETAMINOPHEN 325 MG PO TABS
650.0000 mg | ORAL_TABLET | Freq: Four times a day (QID) | ORAL | Status: DC | PRN
  Administered 2020-04-19 – 2020-04-22 (×3): 650 mg via ORAL
  Filled 2020-04-19 (×4): qty 2

## 2020-04-19 NOTE — Consult Note (Addendum)
[facility] Consult: 8:18 AM 04/19/2020  LOS: 1 day    Referring Provider: Dr Eliseo Squires  Primary Care Physician:  Cyndi Bender, PA-C Primary Gastroenterologist:  Dr. Eudelia Bunch    Reason for Consultation:  Anemia.     HPI: Andrew Hardy. is a 79 y.o. male.  PMH CAD. Hx MI and several stents.  Atrial flutter.  On Plavix and Xarelto.  Arthritis.  Barrett's esophagus.  Patient was having regular EGDs and colonoscopy but has had none since before COVID-19 pandemic.  He thinks his last EGD was in late 2019 and his colonoscopy was 2017 or 2018.  No history of significant dysplasia on biopsies and no history of colon polyps. After starting Xarelto about a year ago, patient noticed his stools developed a rusty color which has been consistent.  No overt blood or melena however.  No change in his daily BMs.  Good appetite, no dysphagia, no reflux, no vomiting.  Compliant with taking Nexium daily.  No aspirin, this was discontinued when Xarelto was added.  Progressive dyspnea with exertion over a few months.  Digoxin was added to his medical regimen about 6 weeks ago.  DOE did not improve after increase diuretics per cardiologist on 12/23.  More recent chest pressure w exertion, lightheaded w change position.  However patient is still quite active.  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  The next day, yesterday he was too tired and short of breath to split the remainder of the wood.    Tachy to 110 in ED  Hgb 8.9 >> 7.5 c/w 14.3 in April.  FOBT +.  Mildly elevated Troponins.   Protonix 40 mg IV bid started.    No family history of stomach or intestinal diseases or cancers. His father died from melanoma.  His mother died from  congestive heart failure and emphysema. Lives with his wife in [place].  Drinks alcohol on occasion.    Past Medical History:  Diagnosis Date  . Anxiety   . Arthritis   . Atypical atrial flutter ([place]) 12/14/2014  . Bradycardia, sinus 10/15/2014  . Burning sensation of feet 01/17/2016  . CAD, multiple vessel 10/18/2014   Overview:  DES to [facility] for unstable angina 10/17/14 Diagnostic Summary Severe stenosis of mid Cx likely cause of recent accelerating angina Diffuse severe stenosis of small caliber LAD unchanged from 2012 Normal LV function Paroxysmal SVT noted during procedure 04/23/16:  PCI / Resolute Drug Eluting Stent of the proximal Left Anterior Descending Coronary Artery. Successful PTCA of the ostial 1st Diagonal Coronary Artery  . 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 Coronary artery disease 2003   s/p MI and numerous stents  . Coronary artery disease involving native coronary artery of native heart with angina pectoris ([place]) 10/18/2014   Overview:  DES to [facility] for unstable angina 10/17/14 Diagnostic Summary Severe stenosis of mid Cx likely cause of recent accelerating angina Diffuse severe stenosis of small caliber  LAD unchanged from 2012 Normal LV function Paroxysmal SVT noted during procedure 04/23/16:  PCI / Resolute Drug Eluting Stent of the proximal Left Anterior Descending Coronary Artery. Successful PTCA of the ostial 1st Di  . 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 Degenerative spondylolisthesis 05/02/2017  . Depression   . Enlarged prostate without lower urinary tract symptoms (luts) 2012   frequent urination and nocturia; sees dr Oda Cogan in [place]  . Essential hypertension 10/15/2014  . Gastroesophageal reflux disease without esophagitis 04/21/2016  . GERD (gastroesophageal reflux disease)   . Hyperlipidemia   . Hypertension   . 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 IHD (ischemic heart disease) 10/15/2014  .  Lower limb pain, inferior, right 12/17/2017  . Lumbar radiculopathy 02/21/2016  . Myocardial infarction (HCC) 2003  . NSTEMI (non-ST elevated myocardial infarction) (HCC) 04/24/2016   Overview:  LHC 04/24/15: Severe stenosis of the LAD & Diagonal bifurcation  LV ejection fraction is 55-60 %  Interventional Summary Successful Angiosculpt PCI / Resolute Drug Eluting Stent of the proximal LAD Successful PTCA of the ostial 1st Diagonal Coronary Artery  . Pain in both feet 01/17/2016  . Pain in right knee 10/20/2017  . Pain of left hip joint 11/26/2017  . Paroxysmal SVT (supraventricular tachycardia) (HCC) 11/16/2014  . Precordial pain 10/15/2014  . PVC's (premature ventricular contractions) 04/22/2016  . RBBB 10/15/2014  . S/P lumbar fusion 04/24/2017  . Sacroiliac joint pain 09/10/2018  . Sleep disturbances 01/17/2016  . Snoring 01/17/2016  . Somatic dysfunction of left sacroiliac joint 06/19/2018  . Subscapularis (muscle) sprain 03/29/2011  . 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 CARDIAC CATHETERIZATION  10-2010   ptca/stent in [place]  . CARDIAC CATHETERIZATION  04/23/2016  . CORONARY ANGIOPLASTY    . EYE SURGERY  05-1010   bil cataract ,iol  . FRACTURE SURGERY    . I & D EXTREMITY Right 02/23/2015   Procedure: RIGHT RING FINGER REVISION AMPUTATION;  Surgeon: Betha Loa, MD;  Location: [facility] OR;  Service: Orthopedics;  Laterality: Right;  . KNEE ARTHROSCOPY W/ MENISCAL REPAIR  2000   left knee  . LUMBAR EPIDURAL INJECTION  2011   low back pain; DDD  . ring finger tramatic qamputation Right   . SHOULDER ARTHROSCOPY  03/29/2011   Procedure: ARTHROSCOPY SHOULDER;  Surgeon: Verlee Rossetti;  Location: [facility] OR;  Service: Orthopedics;  Laterality: Right;  RIGHT SHOULDER ARTHROSCOPY WITH OPEN SUBSCAPULAR REPAIR  . SHOULDER OPEN ROTATOR CUFF REPAIR     3 on rt shoulder; 2 on left  . TRANSFORAMINAL LUMBAR INTERBODY FUSION (TLIF) WITH PEDICLE  SCREW FIXATION 1 LEVEL N/A 04/24/2017   Procedure: TRANSFORAMINAL LUMBAR INTERBODY FUSION (TLIF) L4-5;  Surgeon: Venita Lick, MD;  Location: [facility] OR;  Service: Orthopedics;  Laterality: N/A;  4 hrs    Prior to Admission medications   Medication Sig Start Date End Date Taking? Authorizing Provider  acetaminophen (TYLENOL) 500 MG tablet Take 1,000 mg by mouth at bedtime as needed.     [provider]  clopidogrel (PLAVIX) 75 MG tablet TAKE 1 TABLET(75 MG) BY MOUTH DAILY 11/11/19   Baldo Daub, MD  Co-Enzyme Q-10 30 MG CAPS Take 100 mg by mouth daily.    [provider]  Cyanocobalamin (B-12 PO) Take 1 tablet by mouth daily.    [provider]  digoxin (LANOXIN) 0.25 MG tablet Take 1 tablet (0.25 mg  total) by mouth daily. 03/20/20   Richardo Priest, MD  EPINEPHrine 0.3 mg/0.3 mL IJ SOAJ injection  08/23/15   [provider]  esomeprazole (NEXIUM) 40 MG capsule TAKE 1 CAPSULE BY MOUTH DAILY BEFORE BREAKFAST 02/09/20   Richardo Priest, MD  ezetimibe (ZETIA) 10 MG tablet Take 1 tablet (10 mg total) by mouth every evening. 06/11/19   Richardo Priest, MD  finasteride (PROSCAR) 5 MG tablet Take 5 mg by mouth daily. 01/17/15   [provider]  Flaxseed, Linseed, (FLAXSEED OIL MAX STR) 1300 MG CAPS Take 1,300 mg by mouth daily.    [provider]  furosemide (LASIX) 80 MG tablet Take 1 tablet (80 mg total) by mouth in the morning. And take 40 mg in the evening. 04/13/20   Richardo Priest, MD  gemfibrozil (LOPID) 600 MG tablet TAKE 1/2 TABLET BY MOUTH TWICE DAILY BEFORE A MEAL 11/11/19   Richardo Priest, MD  Glucosamine HCl (GLUCOSAMINE PO) Take 1 tablet by mouth daily.    [provider]  isosorbide mononitrate (IMDUR) 60 MG 24 hr tablet TAKE 1 TABLET(60 MG) BY MOUTH DAILY 09/13/19   Richardo Priest, MD  metoprolol succinate (TOPROL-XL) 25 MG 24 hr tablet TAKE 1 TABLET(25 MG) BY MOUTH TWICE DAILY 12/10/19   Richardo Priest, MD  minocycline  (MINOCIN,DYNACIN) 50 MG capsule TAKE 1 CAPSULE BY MOUTH QD 05/13/17   [provider]  Misc Natural Products (PUMPKIN SEED OIL) CAPS Take 1 capsule by mouth daily.    [provider]  Multiple Vitamins-Minerals (MULTIVITAMINS THER. W/MINERALS) TABS Take 1 tablet by mouth daily.    [provider]  nitroGLYCERIN (NITROSTAT) 0.4 MG SL tablet Place 1 tablet (0.4 mg total) under the tongue every 5 (five) minutes as needed for chest pain. TAKE 1 TABLET UNDER THE TONGUE AS NEEDED AS DIRECTED FOR CHEST PAIN. 03/09/19   Richardo Priest, MD  Omega-3 Fatty Acids (FISH OIL PO) Take 1 tablet by mouth daily.    [provider]  potassium chloride SA (KLOR-CON) 20 MEQ tablet Take 2 tablets (40 mEq total) by mouth in the morning. And take 20 mEq in the evening. 04/13/20   Richardo Priest, MD  simvastatin (ZOCOR) 40 MG tablet TAKE 1 TABLET(40 MG) BY MOUTH AT BEDTIME 12/01/19   Richardo Priest, MD  tamsulosin (FLOMAX) 0.4 MG CAPS capsule Take 0.4 mg by mouth daily.    [provider]  traZODone (DESYREL) 50 MG tablet Take 50 mg by mouth at bedtime. 07/31/19   [provider]  vitamin E 400 UNIT capsule Take 400 Units by mouth daily.    [provider]  XARELTO 20 MG TABS tablet TAKE 1 TABLET(20 MG) BY MOUTH DAILY WITH SUPPER 04/10/20   Richardo Priest, MD    Scheduled Meds: . isosorbide mononitrate  60 mg Oral Daily  . metoprolol succinate  25 mg Oral Daily  . pantoprazole (PROTONIX) IV  40 mg Intravenous Q12H  . simvastatin  40 mg Oral QHS  . traZODone  100 mg Oral QHS   Infusions: . sodium chloride     PRN Meds: sodium chloride, nitroGLYCERIN, ondansetron **OR** ondansetron (ZOFRAN) IV   Allergies as of 04/18/2020 - Review Complete 04/18/2020  Allergen Reaction Noted  . Sotalol hcl  12/13/2019  . Buprenorphine hcl Itching 03/20/2015  . Codeine Itching 03/20/2015  . Hydrocodone Itching 03/15/2011  . Percocet [oxycodone-acetaminophen]  Itching 02/23/2015    Family History  Problem  Relation Age of Onset  . Heart disease Mother   . Heart disease Father   . Cancer Father   . Cancer Sister     Social History   Socioeconomic History  . Marital status: Married    Spouse name: Not on file  . Number of children: Not on file  . Years of education: Not on file  . Highest education level: Not on file  Occupational History  . Not on file  Tobacco Use  . Smoking status: Former Smoker    Packs/day: 1.00    Years: 20.00    Pack years: 20.00    Types: Cigarettes    Quit date: 03/20/1994    Years since quitting: 26.1  . Smokeless tobacco: Former Systems developer    Types: [place] date: 03/20/1996  Vaping Use  . Vaping Use: Never used  Substance and Sexual Activity  . Alcohol use: Yes    Alcohol/week: 6.0 standard drinks    Types: 6 Cans of beer per week  . Drug use: No  . Sexual activity: Not on file  Other Topics Concern  . Not on file  Social History Narrative  . Not on file   Social Determinants of Health   Financial Resource Strain: Not on file  Food Insecurity: Not on file  Transportation Needs: Not on file  Physical Activity: Not on file  Stress: Not on file  Social Connections: Not on file  Intimate Partner Violence: Not on file    REVIEW OF SYSTEMS: Constitutional:  Per HPI ENT:  Nasal cauterization of epistaxis > 1 year ago (pre Xarelto) Pulm:  Per HPI.   No cough CV:  No palpitations, no LE edema. Chest pressure on left w exertion, mild.   GU:  No hematuria, no frequency GI:  See HPI Heme:  No excessive or unusual bleeding or bruising Transfusions: None Neuro:  No headaches, no peripheral tingling or numbness.  No syncope, no seizures.  No blurry vision Derm:  No itching, no rash or sores.  No slow to heal wounds.  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 Endocrine:  No sweats or chills.  No polyuria or dysuria.  No history of hyperglycemia/diabetes Immunization: Vaccinated for  Covid x3.    PHYSICAL EXAM: Vital signs in last 24 hours: Vitals:   04/19/20 0353 04/19/20 0716  BP: 96/63 111/76  Pulse: 97 95  Resp: 17 16  Temp: 98.2 F (36.8 C) 98.1 F (36.7 C)  SpO2: 99% 98%   Wt Readings from Last 3 Encounters:  04/19/20 96.8 kg  04/13/20 99.8 kg  03/20/20 101.3 kg    General: Pleasant, overweight, comfortable, well-appearing, alert Head: No facial asymmetry or swelling.  No signs of head trauma. Eyes: No conjunctival pallor.  No scleral icterus.  EOMI Ears: No hearing deficit Nose: No congestion or discharge Mouth: Moist, pink, clear oral mucosa.  Tongue midline. Neck: No JVD, masses, thyromegaly Lungs: Clear bilaterally with good breath sounds.  No labored breathing or cough Heart: Irregular, irregular rate in the 90s.  No MRG.  S1, S2 present Abdomen: Soft.  No distention or tenderness.  Active bowel sounds.  No HSM, masses, bruits, hernia.   Rectal: Deferred Musc/Skeltl: No joint redness, swelling or gross deformities.  Some arthritic changes in his hands/fingers. Extremities: No peripheral edema Neurologic: Alert.  Oriented x3.  Moves all 4 limbs without tremor, strength not tested. Skin: Extensive swath of hyperpigmentation on the dorsal forearms. Tattoos:  Large, professional quality tattoo of a bear on his posterior right upper back Nodes: No cervical adenopathy Psych: Cooperative, calm, pleasant.  Intake/Output from previous day: 12/28 0701 - 12/29 0700 In: -  Out: 400 [Urine:400] Intake/Output this shift: No intake/output data recorded.  LAB RESULTS: Recent Labs    04/18/20 1311 04/19/20 0048  WBC 7.8  --   HGB 8.9* 7.5*  HCT 27.3* 23.7*  PLT 316  --    BMET Lab Results  Component Value Date   NA 137 04/19/2020   NA 134 (L) 04/18/2020   NA 145 (H) 04/13/2020   K 3.5 04/19/2020   K 4.1 04/18/2020   K 3.6 04/13/2020   CL 102 04/19/2020   CL 98 04/18/2020   CL 104 04/13/2020   CO2 25 04/19/2020   CO2 24 04/18/2020    CO2 27 04/13/2020   GLUCOSE 123 (H) 04/19/2020   GLUCOSE 152 (H) 04/18/2020   GLUCOSE 115 (H) 04/13/2020   BUN 20 04/19/2020   BUN 25 (H) 04/18/2020   BUN 19 04/13/2020   CREATININE 1.31 (H) 04/19/2020   CREATININE 1.46 (H) 04/18/2020   CREATININE 1.28 (H) 04/13/2020   CALCIUM 9.0 04/19/2020   CALCIUM 8.9 04/18/2020   CALCIUM 9.2 04/13/2020   LFT No results for input(s): PROT, ALBUMIN, AST, ALT, ALKPHOS, BILITOT, BILIDIR, IBILI in the last 72 hours. PT/INR Lab Results  Component Value Date   INR 0.90 03/21/2011     RADIOLOGY STUDIES: DG Chest Port 1 View  Result Date: 04/18/2020 CLINICAL DATA:  Chest pain and shortness of breath EXAM: PORTABLE CHEST 1 VIEW COMPARISON:  04/13/2020 FINDINGS: Upper normal heart size. The lungs appear clear. Mild atherosclerotic calcification of the aortic arch. Mild thoracic spondylosis. No blunting of the costophrenic angles. 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 IMPRESSION: 1. No active cardiopulmonary disease is radiographically apparent. Electronically Signed   By: Gaylyn Rong M.D.   On: 04/18/2020 17:01    IMPRESSION:   *   Normocytic anemia.  Hb down close to 6 g over 8 months.  *    History of Barrett's esophagus.  Compliant with daily Nexium.  Was having regular surveillance EGDs but these were last performed in late 03/2018. No dysphagia or worrisome UGI symptoms.   *     FOBT positive.  Reports rusty color stool since initiating Xarelto in 04/2019.    *   Significant history coronary artery disease and multiple stents.  Atrial flutter.  Chronic Plavix.  Last dose was AM on 12/28.  Chronic Xarelto last dose PM on 12/27.  Both meds on hold Mild elevation of Trop Is and BNP, likely from demand ischemia.    *   CKD stage 3a.  Not that severe so not likely causing anemia.     PLAN:     *    EGD and colonoscopy.  5-day hold on Plavix would be up on Monday 1/3.    *   Ok to have HH diet,  ordered  *   Hgb hct this afternoon, CBC in AM.    *   Switch to oral PPI, as this is not an acute GIB.  This is slow blood loss.     Jennye Moccasin  04/19/2020, 8:18 AM Phone [phone]    Attending physician's note   I have taken an interval history, reviewed the chart and examined the patient. I agree with the Advanced Practitioner's note, impression and recommendations.  Symptomatic anemia with FOBT+ on Plavix (last dose 12/28)/Xarelto (last dose 12/27). Hb 14.3 (07/2019) to 8.9 to 7.5. No overt bleeding. Last colon ?2017/2018 per pt.  GERD with H/O Barrett's esophagus on Nexium, followed by Dr. Lyda Jester. Last EGD late 2019.  Elevated troponin. ?Demand ischemia  Associated A Fib/A flutter, CAD s/p multiple stents, HTN, HLD (Dr Bettina Gavia).  Plan: -IV Protonix 40mg  BID -Trend CBC.  Transfuse if Hb<7. -Recommend EGD/colon after 5day Plavix washout- 04/24/2020, unless active bleeding. -Agree to hold Plavix/Xarelto.  Can start baby ASA. -?Cardiology consultation. -CT AP in the next 1 to 2 days once Cr improves. -D/w Pt and pt's wife. -Please call us if with any ?/change in status.    Carmell Austria, MD [facility] [phone]

## 2020-04-19 NOTE — H&P (Signed)
History and Physical    Andrew Hardy. [id number] DOB: 10/21/1940 DOA: 04/18/2020  PCP: Cyndi Bender, PA-C   Patient coming from: Home   Chief Complaint: SOB, chest pressure, lightheadedness   HPI: Andrew Rought. is a 79 y.o. male with medical history significant for coronary artery disease, chronic systolic CHF, atrial fibrillation on Xarelto, now presenting to the emergency department for evaluation of shortness of breath, chest pressure, and lightheadedness.  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  He has also developed some chest pressure with exertion recently and has been lightheaded when he stands up.  He denies any leg swelling, orthopnea, fevers, chills, or significant cough.  He denies any abdominal pain, nausea, or vomiting, and has not seen any dark stool, but reports noticing a reddish hue to his stool weeks ago.  Andrew Hardy: Upon arrival to the ED, patient is found to be afebrile, saturating well on room air, tachycardic to 110, and with stable blood pressure.  EKG features atrial fibrillation with RVR, rate 111, RBBB, and T wave abnormality.  Chest x-rays negative for acute cardiopulmonary disease.  Chemistry panel is notable for creatinine 1.46.  CBC features a hemoglobin of 8.9, down from 14.3 in April.  High-sensitivity troponin is slightly elevated at 20 and then 21.  BNP is 107.  Fecal occult blood testing was positive.  Gastroenterology was consulted by the ED physician and patient was treated with IV Protonix.  Review of Systems:  All other systems reviewed and apart from HPI, are negative.  Past Medical History:  Diagnosis Date  . Anxiety   . Arthritis   . Atypical atrial flutter ([place]) 12/14/2014  . Bradycardia, sinus 10/15/2014   . Burning sensation of feet 01/17/2016  . CAD, multiple vessel 10/18/2014   Overview:  DES to [facility] for unstable angina 10/17/14 Diagnostic Summary Severe stenosis of mid Cx likely cause of recent accelerating angina Diffuse severe stenosis of small caliber LAD unchanged from 2012 Normal LV function Paroxysmal SVT noted during procedure 04/23/16:  PCI / Resolute Drug Eluting Stent of the proximal Left Anterior Descending Coronary Artery. Successful PTCA of the ostial 1st Diagonal Coronary Artery  . 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 Coronary artery disease 2003   s/p MI and numerous stents  . Coronary artery disease involving native coronary artery of native heart with angina pectoris ([place]) 10/18/2014   Overview:  DES to [facility] for unstable angina 10/17/14 Diagnostic Summary Severe stenosis of mid Cx likely cause of recent accelerating angina Diffuse severe stenosis of small caliber LAD unchanged from 2012 Normal LV function Paroxysmal SVT noted during procedure 04/23/16:  PCI / Resolute Drug Eluting Stent of the proximal Left Anterior Descending Coronary Artery. Successful PTCA of the ostial 1st Di  . 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 Degenerative spondylolisthesis 05/02/2017  . Depression   . Enlarged prostate without lower urinary tract symptoms (luts) 2012   frequent urination and nocturia; sees dr [name] Late in [place]  . Essential hypertension 10/15/2014  . Gastroesophageal reflux disease without esophagitis 04/21/2016  . GERD (gastroesophageal reflux disease)   . Hyperlipidemia   . Hypertension   . Hypertensive heart disease with heart failure ([place]) 12/20/2016  . IHD (ischemic heart disease) 10/15/2014  .  Lower limb pain, inferior, right 12/17/2017  . Lumbar radiculopathy 02/21/2016  . Myocardial infarction (HCC) 2003  . NSTEMI (non-ST elevated myocardial infarction) (HCC) 04/24/2016   Overview:  LHC 04/24/15: Severe stenosis of the LAD & Diagonal  bifurcation  LV ejection fraction is 55-60 %  Interventional Summary Successful Angiosculpt PCI / Resolute Drug Eluting Stent of the proximal LAD Successful PTCA of the ostial 1st Diagonal Coronary Artery  . Pain in both feet 01/17/2016  . Pain in right knee 10/20/2017  . Pain of left hip joint 11/26/2017  . Paroxysmal SVT (supraventricular tachycardia) (HCC) 11/16/2014  . Precordial pain 10/15/2014  . PVC's (premature ventricular contractions) 04/22/2016  . RBBB 10/15/2014  . S/P lumbar fusion 04/24/2017  . Sacroiliac joint pain 09/10/2018  . Sleep disturbances 01/17/2016  . Snoring 01/17/2016  . Somatic dysfunction of left sacroiliac joint 06/19/2018  . Subscapularis (muscle) sprain 03/29/2011  . 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 CARDIAC CATHETERIZATION  10-2010   ptca/stent in [place]  . CARDIAC CATHETERIZATION  04/23/2016  . CORONARY ANGIOPLASTY    . EYE SURGERY  05-1010   bil cataract ,iol  . FRACTURE SURGERY    . I & D EXTREMITY Right 02/23/2015   Procedure: RIGHT RING FINGER REVISION AMPUTATION;  Surgeon: Betha LoaKevin Kuzma, MD;  Location: [facility] OR;  Service: Orthopedics;  Laterality: Right;  . KNEE ARTHROSCOPY W/ MENISCAL REPAIR  2000   left knee  . LUMBAR EPIDURAL INJECTION  2011   low back pain; DDD  . ring finger tramatic qamputation Right   . SHOULDER ARTHROSCOPY  03/29/2011   Procedure: ARTHROSCOPY SHOULDER;  Surgeon: Verlee RossettiSteven R Norris;  Location: [facility] OR;  Service: Orthopedics;  Laterality: Right;  RIGHT SHOULDER ARTHROSCOPY WITH OPEN SUBSCAPULAR REPAIR  . SHOULDER OPEN ROTATOR CUFF REPAIR     3 on rt shoulder; 2 on left  . TRANSFORAMINAL LUMBAR INTERBODY FUSION (TLIF) WITH PEDICLE SCREW FIXATION 1 LEVEL N/A 04/24/2017   Procedure: TRANSFORAMINAL LUMBAR INTERBODY FUSION (TLIF) L4-5;  Surgeon: Venita LickBrooks, Dahari, MD;  Location: [facility] OR;  Service: Orthopedics;  Laterality: N/A;  4 hrs    Social History:   reports that he quit  smoking about 26 years ago. His smoking use included cigarettes. He has a 20.00 pack-year smoking history. He quit smokeless tobacco use about 24 years ago.  His smokeless tobacco use included chew. He reports current alcohol use of about 6.0 standard drinks of alcohol per week. He reports that he does not use drugs.  Allergies  Allergen Reactions  . Sotalol Hcl   . Buprenorphine Hcl Itching  . Codeine Itching  . Hydrocodone Itching  . Percocet [Oxycodone-Acetaminophen] Itching    Pt.states he can take but must take with benadryl    Family History  Problem Relation Age of Onset  . Heart disease Mother   . Heart disease Father   . Cancer Father   . Cancer Sister      Prior to Admission medications   Medication Sig Start Date End Date Taking? Authorizing Provider  acetaminophen (TYLENOL) 500 MG tablet Take 1,000 mg by mouth at bedtime as needed.     [provider]  clopidogrel (PLAVIX) 75 MG tablet TAKE 1 TABLET(75 MG) BY MOUTH DAILY 11/11/19   Baldo DaubMunley, Brian J, MD  Co-Enzyme Q-10 30 MG CAPS Take 100 mg by mouth daily.    [provider]  Cyanocobalamin (B-12 PO) Take 1 tablet by  mouth daily.    [provider]  digoxin (LANOXIN) 0.25 MG tablet Take 1 tablet (0.25 mg total) by mouth daily. 03/20/20   Richardo Priest, MD  EPINEPHrine 0.3 mg/0.3 mL IJ SOAJ injection  08/23/15   [provider]  esomeprazole (NEXIUM) 40 MG capsule TAKE 1 CAPSULE BY MOUTH DAILY BEFORE BREAKFAST 02/09/20   Richardo Priest, MD  ezetimibe (ZETIA) 10 MG tablet Take 1 tablet (10 mg total) by mouth every evening. 06/11/19   Richardo Priest, MD  finasteride (PROSCAR) 5 MG tablet Take 5 mg by mouth daily. 01/17/15   [provider]  Flaxseed, Linseed, (FLAXSEED OIL MAX STR) 1300 MG CAPS Take 1,300 mg by mouth daily.    [provider]  furosemide (LASIX) 80 MG tablet Take 1 tablet (80 mg total) by mouth in the morning. And take 40 mg in the evening. 04/13/20    Richardo Priest, MD  gemfibrozil (LOPID) 600 MG tablet TAKE 1/2 TABLET BY MOUTH TWICE DAILY BEFORE A MEAL 11/11/19   Richardo Priest, MD  Glucosamine HCl (GLUCOSAMINE PO) Take 1 tablet by mouth daily.    [provider]  isosorbide mononitrate (IMDUR) 60 MG 24 hr tablet TAKE 1 TABLET(60 MG) BY MOUTH DAILY 09/13/19   Richardo Priest, MD  metoprolol succinate (TOPROL-XL) 25 MG 24 hr tablet TAKE 1 TABLET(25 MG) BY MOUTH TWICE DAILY 12/10/19   Richardo Priest, MD  minocycline (MINOCIN,DYNACIN) 50 MG capsule TAKE 1 CAPSULE BY MOUTH QD 05/13/17   [provider]  Misc Natural Products (PUMPKIN SEED OIL) CAPS Take 1 capsule by mouth daily.    [provider]  Multiple Vitamins-Minerals (MULTIVITAMINS THER. W/MINERALS) TABS Take 1 tablet by mouth daily.    [provider]  nitroGLYCERIN (NITROSTAT) 0.4 MG SL tablet Place 1 tablet (0.4 mg total) under the tongue every 5 (five) minutes as needed for chest pain. TAKE 1 TABLET UNDER THE TONGUE AS NEEDED AS DIRECTED FOR CHEST PAIN. 03/09/19   Richardo Priest, MD  Omega-3 Fatty Acids (FISH OIL PO) Take 1 tablet by mouth daily.    [provider]  potassium chloride SA (KLOR-CON) 20 MEQ tablet Take 2 tablets (40 mEq total) by mouth in the morning. And take 20 mEq in the evening. 04/13/20   Richardo Priest, MD  simvastatin (ZOCOR) 40 MG tablet TAKE 1 TABLET(40 MG) BY MOUTH AT BEDTIME 12/01/19   Richardo Priest, MD  tamsulosin (FLOMAX) 0.4 MG CAPS capsule Take 0.4 mg by mouth daily.    [provider]  traZODone (DESYREL) 50 MG tablet Take 50 mg by mouth at bedtime. 07/31/19   [provider]  vitamin E 400 UNIT capsule Take 400 Units by mouth daily.    [provider]  XARELTO 20 MG TABS tablet TAKE 1 TABLET(20 MG) BY MOUTH DAILY WITH SUPPER 04/10/20   Richardo Priest, MD    Physical Exam: Vitals:   04/18/20 2205 04/18/20 2230 04/18/20 2252 04/18/20 2330  BP: 93/73 114/65 114/65 122/73  Pulse:  (!) 104 94 100 96  Resp: 19 19 17 19   Temp:   98.3 F (36.8 C) 98.6 F (37 C)  TempSrc:    Oral  SpO2: 96% 98% 99% 97%  Weight:    96.8 kg  Height:    5\' 7"  (1.702 m)    Constitutional: NAD, calm  Eyes: PERTLA, lids and conjunctivae normal ENMT: Mucous membranes are moist. Posterior pharynx clear of any  exudate or lesions.   Neck: normal, supple, no masses, no thyromegaly Respiratory: no wheezing, no crackles. No accessory muscle use.  Cardiovascular: Rate ~80 and irregularly irregular. No extremity edema.  Abdomen: No distension, no tenderness, soft. Bowel sounds active.  Musculoskeletal: no clubbing / cyanosis. No joint deformity upper and lower extremities.   Skin: no significant rashes, lesions, ulcers. Warm, dry, well-perfused. Neurologic: CN 2-12 grossly intact. Sensation intact. Moving all extremities.  Psychiatric: Alert and oriented to person, place, and situation. Pleasant and cooperative.    Labs and Imaging on Admission: I have personally reviewed following labs and imaging studies  CBC: Recent Labs  Lab 04/18/20 1311  WBC 7.8  HGB 8.9*  HCT 27.3*  MCV 89.8  PLT 316   Basic Metabolic Panel: Recent Labs  Lab 04/13/20 1604 04/18/20 1311  NA 145* 134*  K 3.6 4.1  CL 104 98  CO2 27 24  GLUCOSE 115* 152*  BUN 19 25*  CREATININE 1.28* 1.46*  CALCIUM 9.2 8.9   GFR: Estimated Creatinine Clearance: 47 mL/min (A) (by C-G formula based on SCr of 1.46 mg/dL (H)). Liver Function Tests: No results for input(s): AST, ALT, ALKPHOS, BILITOT, PROT, ALBUMIN in the last 168 hours. No results for input(s): LIPASE, AMYLASE in the last 168 hours. No results for input(s): AMMONIA in the last 168 hours. Coagulation Profile: No results for input(s): INR, PROTIME in the last 168 hours. Cardiac Enzymes: No results for input(s): CKTOTAL, CKMB, CKMBINDEX, TROPONINI in the last 168 hours. BNP (last 3 results) Recent Labs    03/27/20 0000 04/13/20 1604  PROBNP 77 521*    HbA1C: No results for input(s): HGBA1C in the last 72 hours. CBG: No results for input(s): GLUCAP in the last 168 hours. Lipid Profile: No results for input(s): CHOL, HDL, LDLCALC, TRIG, CHOLHDL, LDLDIRECT in the last 72 hours. Thyroid Function Tests: No results for input(s): TSH, T4TOTAL, FREET4, T3FREE, THYROIDAB in the last 72 hours. Anemia Panel: No results for input(s): VITAMINB12, FOLATE, FERRITIN, TIBC, IRON, RETICCTPCT in the last 72 hours. Urine analysis:    Component Value Date/Time   COLORURINE YELLOW 04/18/2020 1635   APPEARANCEUR CLEAR 04/18/2020 1635   LABSPEC 1.015 04/18/2020 1635   PHURINE 6.0 04/18/2020 1635   GLUCOSEU NEGATIVE 04/18/2020 1635   HGBUR NEGATIVE 04/18/2020 1635   BILIRUBINUR NEGATIVE 04/18/2020 1635   KETONESUR NEGATIVE 04/18/2020 1635   PROTEINUR NEGATIVE 04/18/2020 1635   NITRITE NEGATIVE 04/18/2020 1635   LEUKOCYTESUR NEGATIVE 04/18/2020 1635   Sepsis Labs: @LABRCNTIP (procalcitonin:4,lacticidven:4) ) Recent Results (from the past 240 hour(s))  Resp Panel by RT-PCR (Flu A&B, Covid) Nasopharyngeal Swab     Status: None   Collection Time: 04/18/20  4:20 PM   Specimen: Nasopharyngeal Swab; Nasopharyngeal(NP) swabs in vial transport medium  Result Value Ref Range Status   SARS Coronavirus 2 by RT PCR NEGATIVE NEGATIVE Final    Comment: (NOTE) SARS-CoV-2 target nucleic acids are NOT DETECTED.  The SARS-CoV-2 RNA is generally detectable in upper respiratory specimens during the acute phase of infection. The lowest concentration of SARS-CoV-2 viral copies this assay can detect is 138 copies/mL. A negative result does not preclude SARS-Cov-2 infection and should not be used as the sole basis for treatment or other patient management decisions. A negative result may occur with  improper specimen collection/handling, submission of specimen other than nasopharyngeal swab, presence of viral mutation(s) within the areas targeted by this assay, and  inadequate number of viral copies(<138 copies/mL). A negative result must be combined  with clinical observations, patient history, and epidemiological information. The expected result is Negative.  Fact Sheet for Patients:  EntrepreneurPulse.com.au  Fact Sheet for Healthcare Providers:  IncredibleEmployment.be  This test is no t yet approved or cleared by the [place] FDA and  has been authorized for detection and/or diagnosis of SARS-CoV-2 by FDA under an Emergency Use Authorization (EUA). This EUA will remain  in effect (meaning this test can be used) for the duration of the COVID-19 declaration under Section 564(b)(1) of the Act, 21 U.S.C.section 360bbb-3(b)(1), unless the authorization is terminated  or revoked sooner.       Influenza A by PCR NEGATIVE NEGATIVE Final   Influenza B by PCR NEGATIVE NEGATIVE Final    Comment: (NOTE) The Xpert Xpress SARS-CoV-2/FLU/RSV plus assay is intended as an aid in the diagnosis of influenza from Nasopharyngeal swab specimens and should not be used as a sole basis for treatment. Nasal washings and aspirates are unacceptable for Xpert Xpress SARS-CoV-2/FLU/RSV testing.  Fact Sheet for Patients: EntrepreneurPulse.com.au  Fact Sheet for Healthcare Providers: IncredibleEmployment.be  This test is not yet approved or cleared by the [place] FDA and has been authorized for detection and/or diagnosis of SARS-CoV-2 by FDA under an Emergency Use Authorization (EUA). This EUA will remain in effect (meaning this test can be used) for the duration of the COVID-19 declaration under Section 564(b)(1) of the Act, 21 U.S.C. section 360bbb-3(b)(1), unless the authorization is terminated or revoked.  Performed at [facility], [address] [place], [place] [address]      Radiological Exams on Admission: DG Chest [facility] 1 View  Result Date:  04/18/2020 CLINICAL DATA:  Chest pain and shortness of breath EXAM: PORTABLE CHEST 1 VIEW COMPARISON:  04/13/2020 FINDINGS: Upper normal heart size. The lungs appear clear. Mild atherosclerotic calcification of the aortic arch. Mild thoracic spondylosis. No blunting of the costophrenic angles. 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 IMPRESSION: 1. No active cardiopulmonary disease is radiographically apparent. Electronically Signed   By: Van Clines M.D.   On: 04/18/2020 17:01    EKG: Independently reviewed. Atrial fibrillation, rate 111, RBBB, t-wave abnormality appears similar to prior.   Assessment/Plan   1. Symptomatic anemia; occult GI bleeding  - Presents with progressive DOE, angina, and lightheadedness on standing and is found to have Hgb of 8.9 (down from 12.3 in April) and positive FOBT  - GI was consulted by ED physician and IV Protonix was started  - Continue NPO and IV PPI, type & screen, trend H&H    2. CAD  - Patient reports recent exertional chest pressure, likely secondary to blood-loss anemia  - He had slightly elevated and flat troponins in ED  - Continue statin, nitrates, beta-blocker    3. Chronic systolic CHF  - Appears compensated  - Hold Lasix while NPO, continue beta-blocker as tolerated    4. Renal insufficiency  - SCr is 1.46 on admission, up from 1.28 on 12/23 and 0.81 on 12/6  - Likely mild prerenal azotemia in setting of increased diuretics and GI blood-loss  - Hold Lasix, renally-dose medications, monitor   5. Atrial fibrillation  - Rate was in 110s initially in ED, now normal  - Continue digoxin and metoprolol and hold Xarelto pending further evaluation of GIB    DVT prophylaxis: SCDs Code Status: Full  Family Communication: Discussed with patient  Disposition Plan:  Patient is from: Home  Anticipated d/c is to: Home  Anticipated d/c date  is: 04/21/20 Patient currently: pending GI consultation, repeat  H&H Consults called: GI consulted by ED physician  Admission status: Inpatient     Vianne Bulls, MD [facility]  04/19/2020, 12:22 AM

## 2020-04-19 NOTE — Plan of Care (Signed)
  Problem: Education: ?Goal: Knowledge of General Education information will improve ?Description: Including pain rating scale, medication(s)/side effects and non-pharmacologic comfort measures ?Outcome: Progressing ?  ?Problem: Health Behavior/Discharge Planning: ?Goal: Ability to manage health-related needs will improve ?Outcome: Progressing ?  ?Problem: Clinical Measurements: ?Goal: Ability to maintain clinical measurements within normal limits will improve ?Outcome: Progressing ?Goal: Will remain free from infection ?Outcome: Progressing ?Goal: Diagnostic test results will improve ?Outcome: Progressing ?Goal: Respiratory complications will improve ?Outcome: Progressing ?Goal: Cardiovascular complication will be avoided ?Outcome: Progressing ?  ?Problem: Nutrition: ?Goal: Adequate nutrition will be maintained ?Outcome: Progressing ?  ?Problem: Coping: ?Goal: Level of anxiety will decrease ?Outcome: Progressing ?  ?Problem: Pain Managment: ?Goal: General experience of comfort will improve ?Outcome: Progressing ?  ?

## 2020-04-19 NOTE — Plan of Care (Signed)
  Problem: Education: Goal: Knowledge of General Education information will improve Description: Including pain rating scale, medication(s)/side effects and non-pharmacologic comfort measures Outcome: Progressing   Problem: Clinical Measurements: Goal: Ability to maintain clinical measurements within normal limits will improve Outcome: Progressing Goal: Will remain free from infection Outcome: Progressing Goal: Diagnostic test results will improve Outcome: Progressing Goal: Respiratory complications will improve Outcome: Progressing Goal: Cardiovascular complication will be avoided Outcome: Progressing   Problem: Coping: Goal: Level of anxiety will decrease Outcome: Progressing   Problem: Pain Managment: Goal: General experience of comfort will improve 04/19/2020 0125 by Jodean Lima, RN Outcome: Progressing 04/19/2020 0125 by Jodean Lima, RN Outcome: Progressing   Problem: Safety: Goal: Ability to remain free from injury will improve 04/19/2020 0125 by Jodean Lima, RN Outcome: Progressing 04/19/2020 0125 by Jodean Lima, RN Outcome: Progressing

## 2020-04-19 NOTE — Progress Notes (Signed)
Progress Note    Andrew Hardy.  [id number] DOB: 22-Aug-1940  DOA: 04/18/2020 PCP: Cyndi Bender, PA-C    Brief Narrative:     Medical records reviewed and are as summarized below:  Andrew Hardy. is an 79 y.o. male with medical history significant for coronary artery disease, chronic systolic CHF, atrial fibrillation on Xarelto, now presenting to the emergency department for evaluation of shortness of breath, chest pressure, and lightheadedness.  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  He has also developed some chest pressure with exertion recently and has been lightheaded when he stands up.  Found to have GI bleed.  Prior to colonoscopy needs plavix washout.    Assessment/Plan:   Principal Problem:   GI bleed Active Problems:   Unspecified atrial fibrillation (HCC)   Coronary artery disease involving native coronary artery of native heart with angina pectoris (HCC)   Symptomatic anemia   Renal insufficiency   Chronic systolic CHF (congestive heart failure) (HCC)    Symptomatic anemia; occult GI bleeding  - Presents with progressive DOE, angina, and lightheadedness on standing and is found to have Hgb 7 (down from 12.3 in April) and positive FOBT  - GI was consulted  - plan for colonoscopy on Monday  CAD  - Patient reports recent exertional chest pressure, likely secondary to blood-loss anemia  - He had slightly elevated and flat troponins in ED  - Continue statin, nitrates, beta-blocker    Chronic systolic CHF  - Appears compensated  - Hold Lasix for another 24 hours, continue beta-blocker as tolerated    Renal insufficiency  - SCr is 1.46 on admission, up from 1.28 on 12/23 and 0.81 on 12/6  - Likely mild prerenal azotemia in setting of  increased diuretics and GI blood-loss  - Hold Lasix, renally-dose medications, monitor    permanent Atrial fibrillation  - Rate was in 110s initially in ED, now normal  - Continue digoxin and metoprolol and hold Xarelto pending further evaluation of GIB   obesity Body mass index is 33.42 kg/m.   Family Communication/Anticipated D/C date and plan/Code Status   DVT prophylaxis: scd Code Status: Full Code.  Disposition Plan: Status is: Inpatient  Remains inpatient appropriate because:Inpatient level of care appropriate due to severity of illness   Dispo: The patient is from: Home              Anticipated d/c is to: Home              Anticipated d/c date is: > 3 days              Patient currently is not medically stable to d/c. can not have colonoscopy prior to Monday          Medical Consultants:    GI  Subjective:   Willing to stay for 5 day plavix washout prior to colonoscopy  Objective:    Vitals:   04/18/20 2330 04/19/20 0353 04/19/20 0500 04/19/20 0716  BP: 122/73 96/63  111/76  Pulse: 96 97  95  Resp: 19 17  16   Temp: 98.6 F (37 C) 98.2 F (36.8 C)  98.1 F (36.7 C)  TempSrc: Oral Oral  Oral  SpO2: 97% 99%  98%  Weight: 96.8 kg  96.8  kg   Height: 5\' 7"  (1.702 m)       Intake/Output Summary (Last 24 hours) at 04/19/2020 1030 Last data filed at 04/19/2020 0600 Gross per 24 hour  Intake --  Output 400 ml  Net -400 ml   Filed Weights   04/18/20 1309 04/18/20 2330 04/19/20 0500  Weight: 99.8 kg 96.8 kg 96.8 kg    Exam:  General: Appearance:    Obese male in no acute distress     Lungs:     respirations unlabored  Heart:    Normal heart rate. irregular     Neurologic:   Awake, alert, oriented x 3. No apparent focal neurological           defect.     Data Reviewed:   I have personally reviewed following labs and imaging studies:  Labs: Labs show the following:   Basic Metabolic Panel: Recent Labs  Lab 04/13/20 1604  04/18/20 1311 04/19/20 0048  NA 145* 134* 137  K 3.6 4.1 3.5  CL 104 98 102  CO2 27 24 25   GLUCOSE 115* 152* 123*  BUN 19 25* 20  CREATININE 1.28* 1.46* 1.31*  CALCIUM 9.2 8.9 9.0   GFR Estimated Creatinine Clearance: 52.4 mL/min (A) (by C-G formula based on SCr of 1.31 mg/dL (H)). Liver Function Tests: No results for input(s): AST, ALT, ALKPHOS, BILITOT, PROT, ALBUMIN in the last 168 hours. No results for input(s): LIPASE, AMYLASE in the last 168 hours. No results for input(s): AMMONIA in the last 168 hours. Coagulation profile No results for input(s): INR, PROTIME in the last 168 hours.  CBC: Recent Labs  Lab 04/18/20 1311 04/19/20 0048 04/19/20 0801  WBC 7.8  --   --   HGB 8.9* 7.5* 7.7*  HCT 27.3* 23.7* 25.0*  MCV 89.8  --   --   PLT 316  --   --    Cardiac Enzymes: No results for input(s): CKTOTAL, CKMB, CKMBINDEX, TROPONINI in the last 168 hours. BNP (last 3 results) Recent Labs    03/27/20 0000 04/13/20 1604  PROBNP 77 521*   CBG: No results for input(s): GLUCAP in the last 168 hours. D-Dimer: No results for input(s): DDIMER in the last 72 hours. Hgb A1c: No results for input(s): HGBA1C in the last 72 hours. Lipid Profile: No results for input(s): CHOL, HDL, LDLCALC, TRIG, CHOLHDL, LDLDIRECT in the last 72 hours. Thyroid function studies: No results for input(s): TSH, T4TOTAL, T3FREE, THYROIDAB in the last 72 hours.  Invalid input(s): FREET3 Anemia work up: No results for input(s): VITAMINB12, FOLATE, FERRITIN, TIBC, IRON, RETICCTPCT in the last 72 hours. Sepsis Labs: Recent Labs  Lab 04/18/20 1311  WBC 7.8    Microbiology Recent Results (from the past 240 hour(s))  MRSA PCR Screening     Status: None   Collection Time: 04/18/20 12:41 AM   Specimen: Nasopharyngeal  Result Value Ref Range Status   MRSA by PCR NEGATIVE NEGATIVE Final    Comment:        The GeneXpert MRSA Assay (FDA approved for NASAL specimens only), is one component of  a comprehensive MRSA colonization surveillance program. It is not intended to diagnose MRSA infection nor to guide or monitor treatment for MRSA infections. Performed at [facility], [place] [address], [place],  [address]   Resp Panel by RT-PCR (Flu A&B, Covid) Nasopharyngeal Swab     Status: None   Collection Time: 04/18/20  4:20 PM   Specimen: Nasopharyngeal Swab; Nasopharyngeal(NP)  swabs in vial transport medium  Result Value Ref Range Status   SARS Coronavirus 2 by RT PCR NEGATIVE NEGATIVE Final    Comment: (NOTE) SARS-CoV-2 target nucleic acids are NOT DETECTED.  The SARS-CoV-2 RNA is generally detectable in upper respiratory specimens during the acute phase of infection. The lowest concentration of SARS-CoV-2 viral copies this assay can detect is 138 copies/mL. A negative result does not preclude SARS-Cov-2 infection and should not be used as the sole basis for treatment or other patient management decisions. A negative result may occur with  improper specimen collection/handling, submission of specimen other than nasopharyngeal swab, presence of viral mutation(s) within the areas targeted by this assay, and inadequate number of viral copies(<138 copies/mL). A negative result must be combined with clinical observations, patient history, and epidemiological information. The expected result is Negative.  Fact Sheet for Patients:  EntrepreneurPulse.com.au  Fact Sheet for Healthcare Providers:  IncredibleEmployment.be  This test is no t yet approved or cleared by the [place] FDA and  has been authorized for detection and/or diagnosis of SARS-CoV-2 by FDA under an Emergency Use Authorization (EUA). This EUA will remain  in effect (meaning this test can be used) for the duration of the COVID-19 declaration under Section 564(b)(1) of the Act, 21 U.S.C.section 360bbb-3(b)(1), unless the authorization is terminated  or revoked  sooner.       Influenza A by PCR NEGATIVE NEGATIVE Final   Influenza B by PCR NEGATIVE NEGATIVE Final    Comment: (NOTE) The Xpert Xpress SARS-CoV-2/FLU/RSV plus assay is intended as an aid in the diagnosis of influenza from Nasopharyngeal swab specimens and should not be used as a sole basis for treatment. Nasal washings and aspirates are unacceptable for Xpert Xpress SARS-CoV-2/FLU/RSV testing.  Fact Sheet for Patients: EntrepreneurPulse.com.au  Fact Sheet for Healthcare Providers: IncredibleEmployment.be  This test is not yet approved or cleared by the [place] FDA and has been authorized for detection and/or diagnosis of SARS-CoV-2 by FDA under an Emergency Use Authorization (EUA). This EUA will remain in effect (meaning this test can be used) for the duration of the COVID-19 declaration under Section 564(b)(1) of the Act, 21 U.S.C. section 360bbb-3(b)(1), unless the authorization is terminated or revoked.  Performed at [facility], [address] [place], [place] [address]     Procedures and diagnostic studies:  DG Chest [facility] 1 View  Result Date: 04/18/2020 CLINICAL DATA:  Chest pain and shortness of breath EXAM: PORTABLE CHEST 1 VIEW COMPARISON:  04/13/2020 FINDINGS: Upper normal heart size. The lungs appear clear. Mild atherosclerotic calcification of the aortic arch. Mild thoracic spondylosis. No blunting of the costophrenic angles. 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 IMPRESSION: 1. No active cardiopulmonary disease is radiographically apparent. Electronically Signed   By: Van Clines M.D.   On: 04/18/2020 17:01    Medications:   . isosorbide mononitrate  60 mg Oral Daily  . metoprolol succinate  25 mg Oral Daily  . pantoprazole  40 mg Oral BID  . simvastatin  40 mg Oral QHS  . traZODone  100 mg Oral QHS   Continuous Infusions: . sodium chloride       LOS: 1 day    Geradine Girt  [facility]   How to contact the [facility] Attending or Consulting provider [place] or covering provider during after hours [place], for this patient?  1. Check the care team in [facility] and look for a) attending/consulting [facility] provider  listed and b) the [facility] team listed 2. Log into [email or web address] and use 's universal password to access. If you do not have the password, please contact the hospital operator. 3. Locate the [facility] provider you are looking for under [facility] and page to a number that you can be directly reached. 4. If you still have difficulty reaching the provider, please page the [facility] (Director on Call) for the Hospitalists listed on amion for assistance.  04/19/2020, 10:30 AM

## 2020-04-20 DIAGNOSIS — I5043 Acute on chronic combined systolic (congestive) and diastolic (congestive) heart failure: Secondary | ICD-10-CM | POA: Diagnosis not present

## 2020-04-20 DIAGNOSIS — I2510 Atherosclerotic heart disease of native coronary artery without angina pectoris: Secondary | ICD-10-CM

## 2020-04-20 DIAGNOSIS — N289 Disorder of kidney and ureter, unspecified: Secondary | ICD-10-CM | POA: Diagnosis not present

## 2020-04-20 DIAGNOSIS — K922 Gastrointestinal hemorrhage, unspecified: Secondary | ICD-10-CM | POA: Diagnosis not present

## 2020-04-20 DIAGNOSIS — I4819 Other persistent atrial fibrillation: Secondary | ICD-10-CM

## 2020-04-20 LAB — PREPARE RBC (CROSSMATCH)

## 2020-04-20 LAB — BASIC METABOLIC PANEL
Anion gap: 7 (ref 5–15)
BUN: 18 mg/dL (ref 8–23)
CO2: 27 mmol/L (ref 22–32)
Calcium: 8.8 mg/dL — ABNORMAL LOW (ref 8.9–10.3)
Chloride: 104 mmol/L (ref 98–111)
Creatinine, Ser: 1.34 mg/dL — ABNORMAL HIGH (ref 0.61–1.24)
GFR, Estimated: 55 mL/min — ABNORMAL LOW (ref >60)
Glucose, Bld: 119 mg/dL — ABNORMAL HIGH (ref 70–99)
Potassium: 3.8 mmol/L (ref 3.5–5.1)
Sodium: 138 mmol/L (ref 135–145)

## 2020-04-20 LAB — CBC
HCT: 22.3 % — ABNORMAL LOW (ref 39.0–52.0)
Hemoglobin: 6.9 g/dL — CL (ref 13.0–17.0)
MCH: 27.6 pg (ref 26.0–34.0)
MCHC: 30.9 g/dL (ref 30.0–36.0)
MCV: 89.2 fL (ref 80.0–100.0)
Platelets: 218 10*3/uL (ref 150–400)
RBC: 2.5 MIL/uL — ABNORMAL LOW (ref 4.22–5.81)
RDW: 13.5 % (ref 11.5–15.5)
WBC: 6.3 10*3/uL (ref 4.0–10.5)
nRBC: 0 % (ref 0.0–0.2)

## 2020-04-20 LAB — DIGOXIN LEVEL: Digoxin Level: 0.9 ng/mL (ref 0.8–2.0)

## 2020-04-20 LAB — HEMOGLOBIN AND HEMATOCRIT, BLOOD
HCT: 27.2 % — ABNORMAL LOW (ref 39.0–52.0)
Hemoglobin: 8.6 g/dL — ABNORMAL LOW (ref 13.0–17.0)

## 2020-04-20 MED ORDER — SODIUM CHLORIDE 0.9% IV SOLUTION: Freq: Once | INTRAVENOUS | Status: AC

## 2020-04-20 MED ORDER — FUROSEMIDE 10 MG/ML IJ SOLN
20.0000 mg | Freq: Once | INTRAMUSCULAR | Status: AC
  Administered 2020-04-20: 06:00:00 20 mg via INTRAVENOUS
  Filled 2020-04-20: qty 2

## 2020-04-20 NOTE — Consult Note (Addendum)
Cardiology Consultation:   Patient ID: Andrew Hardy. MRN: [id number]; DOB: Sep 05, 1940  Admit date: 04/18/2020 Date of Consult: 04/20/2020  Primary Care Provider: Lonie Peak, PA-C [facility] Cardiologist: Norman Herrlich, MD  [facility] Electrophysiologist:  Will Jorja Loa, MD    Patient Profile:   Andrew Hardy. is a 79 y.o. male with a hx of CAD s/p multiple stenting, persistent atrial fibrillation/flutter, on Xarelto for anticoagulation, heart failure, hypertension, hyperlipidemia and morbid obesity who is being seen today for the evaluation of atrial fibrillation and anticoagulation management at the request of Dr. Benjamine Mola.  Hx of CAD s/p DES to [facility] 09/2014. PCI wtih Resolute Drug Eluting Stent of the proximal Left Anterior Descending Coronary Artery and PTCA of the ostial 1st Diagonal Coronary Artery on 04/23/16.  Patient had low risk normal stress myoview 03/2019.  Echocardiogram January 2021 with LV function 45 to 50% and mildly elevated pulmonary artery systolic pressure.  Patient also has longstanding history of paroxysmal now persistent atrial fibrillation/flutter.  He has been followed by Dr. Elberta Fortis.  Intolerance to amiodarone due to elevated LFTs.  He was on sotalol as well sotalol, ?  Self discontinued or side effect.  Recently complaint of shortness of breath and fatigue.  He was in fibrillation with rapid ventricular rate when seen by Dr. Dulce Sellar March 20, 2020.  Added digoxin. Had increased shortness of breath and nonproductive cough when seen by Dr. Dulce Sellar April 13, 2020.  Increased Lasix.  History of Present Illness:   [name] has progressively worsening shortness of breath, mostly with exertion,  over the past 1-2 months.  At baseline he is very active.  Monday he was cutting woods but needed to stop frequently due to shortness of breath.  No associated chest pain or palpitation.  He also felt dizzy while trying to stand up.  Tuesday his breathing get  worse while trying to do any activity.  Not particularly orthopnea, PND or lower extremity edema.  Denies bleeding but has noted "rusty" stool.  Patient denies any chest pain however reported upon arrival to ER.  He was also given sublingual nitroglycerin.  He was noted anemic at hemoglobin of 8.9 which dropped to 7.5.  Stool guaiac positive.  Admitted for GI bleed.  Xarelto (last dose PM of 12/27) and Plavix (last dose a.m. of 12/28) were held. Seen by GI and started on PPI.  Hemoglobin dropped to 6.9 early morning s/p transfusion 1 unit of packed red blood with improved hemoglobin to 8.6.  Patient denies any palpitation.  Heart rate in 90s but at times goes to 130.  BNP 107.  High-sensitivity troponin 20>>21.  Symptoms not similar to prior angina when had stenting.   Past Medical History:  Diagnosis Date  . Anxiety   . Arthritis   . Atypical atrial flutter (HCC) 12/14/2014  . Bradycardia, sinus 10/15/2014  . Burning sensation of feet 01/17/2016  . CAD, multiple vessel 10/18/2014   Overview:  DES to [facility] for unstable angina 10/17/14 Diagnostic Summary Severe stenosis of mid Cx likely cause of recent accelerating angina Diffuse severe stenosis of small caliber LAD unchanged from 2012 Normal LV function Paroxysmal SVT noted during procedure 04/23/16:  PCI / Resolute Drug Eluting Stent of the proximal Left Anterior Descending Coronary Artery. Successful PTCA of the ostial 1st Diagonal Coronary Artery  . 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 Coronary artery disease 2003   s/p MI and numerous stents  . Coronary artery disease  involving native coronary artery of native heart with angina pectoris ([place]) 10/18/2014   Overview:  DES to [facility] for unstable angina 10/17/14 Diagnostic Summary Severe stenosis of mid Cx likely cause of recent accelerating angina Diffuse severe stenosis of small caliber LAD unchanged from 2012 Normal LV function Paroxysmal SVT noted during procedure 04/23/16:  PCI /  Resolute Drug Eluting Stent of the proximal Left Anterior Descending Coronary Artery. Successful PTCA of the ostial 1st Di  . 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 Degenerative spondylolisthesis 05/02/2017  . Depression   . Enlarged prostate without lower urinary tract symptoms (luts) 2012   frequent urination and nocturia; sees dr [name] Late in [place]  . Essential hypertension 10/15/2014  . Gastroesophageal reflux disease without esophagitis 04/21/2016  . GERD (gastroesophageal reflux disease)   . Hyperlipidemia   . Hypertension   . Hypertensive heart disease with heart failure ([place]) 12/20/2016  . IHD (ischemic heart disease) 10/15/2014  . Lower limb pain, inferior, right 12/17/2017  . Lumbar radiculopathy 02/21/2016  . Myocardial infarction ([place]) 2003  . NSTEMI (non-ST elevated myocardial infarction) ([place]) 04/24/2016   Overview:  LHC 04/24/15: Severe stenosis of the LAD & Diagonal bifurcation  LV ejection fraction is 55-60 %  Interventional Summary Successful Angiosculpt PCI / Resolute Drug Eluting Stent of the proximal LAD Successful PTCA of the ostial 1st Diagonal Coronary Artery  . Paroxysmal SVT (supraventricular tachycardia) ([place]) 11/16/2014  . Precordial pain 10/15/2014  . PVC's (premature ventricular contractions) 04/22/2016  . RBBB 10/15/2014  . S/P lumbar fusion 04/24/2017  . Sacroiliac joint pain 09/10/2018  . Sleep disturbances 01/17/2016  . Snoring 01/17/2016  . Somatic dysfunction of left sacroiliac joint 06/19/2018  . Subscapularis (muscle) sprain 03/29/2011  . 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 CARDIAC CATHETERIZATION  10-2010   ptca/stent in [place]  . CARDIAC CATHETERIZATION  04/23/2016  . CORONARY ANGIOPLASTY    . EYE SURGERY  05-1010   bil cataract ,iol  . FRACTURE SURGERY    . I & D EXTREMITY Right 02/23/2015   Procedure: RIGHT RING FINGER REVISION AMPUTATION;  Surgeon: Leanora Cover, MD;   Location: [place];  Service: Orthopedics;  Laterality: Right;  . KNEE ARTHROSCOPY W/ MENISCAL REPAIR  2000   left knee  . LUMBAR EPIDURAL INJECTION  2011   low back pain; DDD  . ring finger tramatic qamputation Right   . SHOULDER ARTHROSCOPY  03/29/2011   Procedure: ARTHROSCOPY SHOULDER;  Surgeon: Augustin Schooling;  Location: [place];  Service: Orthopedics;  Laterality: Right;  RIGHT SHOULDER ARTHROSCOPY WITH OPEN SUBSCAPULAR REPAIR  . SHOULDER OPEN ROTATOR CUFF REPAIR     3 on rt shoulder; 2 on left  . TRANSFORAMINAL LUMBAR INTERBODY FUSION (TLIF) WITH PEDICLE SCREW FIXATION 1 LEVEL N/A 04/24/2017   Procedure: TRANSFORAMINAL LUMBAR INTERBODY FUSION (TLIF) L4-5;  Surgeon: Melina Schools, MD;  Location: [place];  Service: Orthopedics;  Laterality: N/A;  4 hrs    Inpatient Medications: Scheduled Meds: . diclofenac Sodium  2 g Topical QID  . digoxin  0.25 mg Oral Daily  . ezetimibe  10 mg Oral QPM  . finasteride  5 mg Oral Daily  . gemfibrozil  300 mg Oral BID AC  . isosorbide mononitrate  60 mg Oral Daily  . metoprolol succinate  25 mg Oral Daily  . minocycline  50 mg Oral Daily  . pantoprazole  40 mg Oral BID  . simvastatin  40 mg Oral QHS  . tamsulosin  0.4 mg Oral Daily  . traZODone  100 mg Oral QHS   Continuous Infusions: . sodium chloride     PRN Meds: sodium chloride, acetaminophen, nitroGLYCERIN, ondansetron **OR** ondansetron (ZOFRAN) IV, simethicone  Allergies:    Allergies  Allergen Reactions  . Sotalol Hcl     Feet burning  . Buprenorphine Hcl Itching  . Codeine Itching  . Hydrocodone Itching  . Percocet [Oxycodone-Acetaminophen] Itching    Pt.states he can take but must take with benadryl    Social History:   Social History   Socioeconomic History  . Marital status: Married    Spouse name: Not on file  . Number of children: Not on file  . Years of education: Not on file  . Highest education level: Not on file  Occupational History  . Not on file  Tobacco Use   . Smoking status: Former Smoker    Packs/day: 1.00    Years: 20.00    Pack years: 20.00    Types: Cigarettes    Quit date: 03/20/1994    Years since quitting: 26.1  . Smokeless tobacco: Former Systems developer    Types: [place] date: 03/20/1996  Vaping Use  . Vaping Use: Never used  Substance and Sexual Activity  . Alcohol use: Yes    Alcohol/week: 6.0 standard drinks    Types: 6 Cans of beer per week  . Drug use: No  . Sexual activity: Not on file  Other Topics Concern  . Not on file  Social History Narrative  . Not on file   Social Determinants of Health   Financial Resource Strain: Not on file  Food Insecurity: Not on file  Transportation Needs: Not on file  Physical Activity: Not on file  Stress: Not on file  Social Connections: Not on file  Intimate Partner Violence: Not on file    Family History:   Family History  Problem Relation Age of Onset  . Heart disease Mother   . Heart disease Father   . Cancer Father   . Cancer Sister      ROS:  Please see the history of present illness.  All other ROS reviewed and negative.     Physical Exam/Data:   Vitals:   04/20/20 0315 04/20/20 0539 04/20/20 0656 04/20/20 0811  BP: 108/64 (!) 99/54  109/67  Pulse: 92 85  95  Resp: 16 15  15   Temp: 98.2 F (36.8 C) 98.1 F (36.7 C)  98.3 F (36.8 C)  TempSrc: Oral Oral  Oral  SpO2: 99% 97%  96%  Weight:   96.1 kg   Height:   5\' 7"  (1.702 m)     Intake/Output Summary (Last 24 hours) at 04/20/2020 1054 Last data filed at 04/20/2020 0735 Gross per 24 hour  Intake 867.17 ml  Output --  Net 867.17 ml   Last 3 Weights 04/20/2020 04/19/2020 04/18/2020  Weight (lbs) 211 lb 13.8 oz 213 lb 6.5 oz 213 lb 6.5 oz  Weight (kg) 96.1 kg 96.8 kg 96.8 kg     Body mass index is 33.18 kg/m.  General:  Well nourished, well developed, in no acute distress HEENT: normal Lymph: no adenopathy Neck: no JVD Endocrine:  No thryomegaly Vascular: No carotid bruits; FA pulses 2+  bilaterally without bruits  Cardiac:  normal S1, S2; irregularly irregular; no murmur  Lungs:  clear to auscultation bilaterally, no wheezing, rhonchi or rales  Abd: soft, nontender, no  hepatomegaly  Ext: no edema Musculoskeletal:  No deformities, BUE and BLE strength normal and equal Skin: warm and dry  Neuro:  CNs 2-12 intact, no focal abnormalities noted Psych:  Normal affect   EKG:  The EKG was personally reviewed and demonstrates: Atrial fibrillation, right bundle branch block Telemetry:  Telemetry was personally reviewed and demonstrates: Atrial fibrillation at heart rate in 80s to 90s but at times goes to 130s  Relevant CV Studies:  Echo 04/2019 1. Left ventricular ejection fraction, by visual estimation, is 45 to  50%. There is mildly increased left ventricular hypertrophy.  2. Left atrial size was normal.  3. Right atrial size was normal.  4. The mitral valve is normal in structure. No evidence of mitral valve  regurgitation. No evidence of mitral stenosis.  5. The tricuspid valve is normal in structure.  6. The aortic valve is normal in structure. Aortic valve regurgitation is  trivial. No evidence of aortic valve sclerosis or stenosis.  7. The pulmonic valve was normal in structure. Pulmonic valve  regurgitation is not visualized.  8. Mildly elevated pulmonary artery systolic pressure.  9. The inferior vena cava is normal in size with greater than 50%  respiratory variability, suggesting right atrial pressure of 3 mmHg.   Stress test 03/2019  There was no ST segment deviation noted during stress.  No T wave inversion was noted during stress.  Nongated study due to atrial fibrillation.  The study is normal.  This is a low risk study.    Laboratory Data:  High Sensitivity Troponin:   Recent Labs  Lab 04/18/20 1311 04/18/20 1517  TROPONINIHS 20* 21*     Chemistry Recent Labs  Lab 04/13/20 1604 04/18/20 1311 04/19/20 0048 04/20/20 0026  NA  145* 134* 137 138  K 3.6 4.1 3.5 3.8  CL 104 98 102 104  CO2 27 24 25 27   GLUCOSE 115* 152* 123* 119*  BUN 19 25* 20 18  CREATININE 1.28* 1.46* 1.31* 1.34*  CALCIUM 9.2 8.9 9.0 8.8*  GFRNONAA 54* 49* 56* 55*  GFRAA 62  --   --   --   ANIONGAP  --  12 10 7     No results for input(s): PROT, ALBUMIN, AST, ALT, ALKPHOS, BILITOT in the last 168 hours. Hematology Recent Labs  Lab 04/18/20 1311 04/19/20 0048 04/19/20 1503 04/20/20 0026 04/20/20 0920  WBC 7.8  --   --  6.3  --   RBC 3.04*  --   --  2.50*  --   HGB 8.9*   < > 7.4* 6.9* 8.6*  HCT 27.3*   < > 23.9* 22.3* 27.2*  MCV 89.8  --   --  89.2  --   MCH 29.3  --   --  27.6  --   MCHC 32.6  --   --  30.9  --   RDW 13.3  --   --  13.5  --   PLT 316  --   --  218  --    < > = values in this interval not displayed.   BNP Recent Labs  Lab 04/13/20 1604 04/18/20 1536  BNP  --  107.1*  PROBNP 521*  --     Radiology/Studies:  DG Chest Port 1 View  Result Date: 04/18/2020 CLINICAL DATA:  Chest pain and shortness of breath EXAM: PORTABLE CHEST 1 VIEW COMPARISON:  04/13/2020 FINDINGS: Upper normal heart size. The lungs appear clear. Mild atherosclerotic calcification of the aortic arch. Mild  thoracic spondylosis. No blunting of the costophrenic angles. 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 IMPRESSION: 1. No active cardiopulmonary disease is radiographically apparent. Electronically Signed   By: Van Clines M.D.   On: 04/18/2020 17:01     Assessment and Plan:   1. CAD s/p multiple stenting -Last PCI in 2018.  Low risk normal stress test December 2020. -Patient's exertional shortness of breath and fatigue is secondary to anemia.  May be component of tachycardia.  He reports symptoms different than prior angina. Troponin low flat consistent with demand ischemia. -Last dose of Plavix a.m. of 12/28 -Continue Zocor, Zetia, Toprol-XL and Imdur -Plavix on hold, resume when okay with GI  2.   Persistent atrial fibrillation -Previously intolerance to amiodarone due to elevated LFTs and sotalol (questionable self discontinued) -Recently placed on digoxin for elevated heart rate -Heart rate mostly in 80s to 90s but at times goes to 130s.  Patient is unaware of any tachycardia. -On Toprol XL 25mg  qd, Up-tirate as BP allows -Continue to hold Xarelto, resume when okay with GI  3.  Chronic systolic heart failure -Echocardiogram January 2021 showed LV function of 45 to 50% -Recently increase Lasix as outpatient due to suspected heart failure but clearly his symptoms seems due to GI bleed -He is not volume overloaded on exam -Continue to hold Lasix -Continue beta-blocker  - He is on digoxin 0.25 daily. His digoxin level was 1.0 on 04/13/20. Will recheck level. Likely reduce dose to 0.125mg  daily.   4.  Symptomatic anemia -S/p 1 unit of PRBCs this morning -GI is planning procedure.  Okay to hold Plavix and Xarelto>> resume when okay with GI.  At baseline patient is very active.  Patient reports symptoms different than prior angina.  He is unaware of palpitations.  5 AKI - His renal function was normal on 03/29/20 -Likely due to recent increase in outpatient Lasix and anemia - Renal function stable   [place] Heart Association (NYHA) Functional Class NYHA Class I  CHA2DS2-VASc Score = 5  This indicates a 7.2% annual risk of stroke. The patient's score is based upon: CHF History: Yes HTN History: Yes Diabetes History: No Stroke History: No Vascular Disease History: Yes Age Score: 2 Gender Score: 0    For questions or updates, please contact [place] Please consult [email or web address] for contact info under    SignedLeanor Kail, PA  04/20/2020 10:54 AM  As above, patient seen and examined.  Briefly he is a 79 year old male with past medical history of coronary artery disease, persistent atrial fibrillation, hypertension, hyperlipidemia, chronic combined  systolic/diastolic congestive heart failure admitted with GI bleed for evaluation of atrial fibrillation and dyspnea.  Patient states he has had progressive dyspnea on exertion since April.  It progressed to the point that he had this with minimal activities.  He denies orthopnea or PND but did have pedal edema which improved with diuretics.  He denies palpitations, chest pain or syncope though he did have dizziness with standing over the past 3 weeks.  Patient was seen in the emergency room and hemoglobin noted to be 6.9.  Patient received 1 unit of packed red blood cells and has been placed on IV Protonix.  Gastroenterology plans EGD and colonoscopy once Plavix has washed out.  Cardiology now asked to evaluate.  Electrocardiogram shows atrial fibrillation with rapid ventricular response and right bundle branch block.  Creatinine 1.34.  Hemoglobin improved to 8.6 following transfusion.  1 atrial fibrillation-patient has persistent atrial  fibrillation.  Continue digoxin and metoprolol at present dose.  His rate is intermittently elevated but likely driven by severity of anemia.  I agree with holding Xarelto until GI bleed resolves.  2 coronary artery disease-last PCI was in 2018.  I therefore would not resume Plavix.  Continue statin at discharge.  We will hold isosorbide until blood pressure improves.  3 chronic combined systolic/diastolic congestive heart failure-Lasix is on hold and patient is euvolemic on examination.  He will likely need to resume at lower dose at time of discharge.  His progressive dyspnea on exertion is secondary to his progressive anemia.  4 GI bleed-gastroenterology following.  Continue Protonix.  For EGD/colonoscopy.  Follow CBC.  Transfuse as needed.  Discontinue Plavix as outlined above.  Xarelto on hold and will be resumed when okay with gastroenterology.  Kirk Ruths, MD\

## 2020-04-20 NOTE — Progress Notes (Signed)
Progress Note    Andrew Hardy.  OL:[id number] DOB: 12-22-1940  DOA: 04/18/2020 PCP: Cyndi Bender, PA-C    Brief Narrative:     Medical records reviewed and are as summarized below:  Andrew Hardy. is an 79 y.o. male with medical history significant for coronary artery disease, chronic systolic CHF, atrial fibrillation on Xarelto, now presenting to the emergency department for evaluation of shortness of breath, chest pressure, and lightheadedness.  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  He has also developed some chest pressure with exertion recently and has been lightheaded when he stands up.  Found to have GI bleed.  Prior to colonoscopy needs plavix washout.    Assessment/Plan:   Principal Problem:   GI bleed Active Problems:   Unspecified atrial fibrillation (HCC)   Coronary artery disease involving native coronary artery of native heart with angina pectoris (HCC)   Symptomatic anemia   Renal insufficiency   Chronic systolic CHF (congestive heart failure) (HCC)    Symptomatic anemia; occult GI bleeding  - Presents with progressive DOE, angina, and lightheadedness on standing and is found to have Hgb 7 (down from 12.3 in April) and positive FOBT  - GI was consulted  - plan for colonoscopy on Monday after plavix wash out (cards consulted for 3 day vs 5 day wash out) -s/p 1 unit PRBC on 12/30  CAD  - Patient reports recent exertional chest pressure, likely secondary to blood-loss anemia  - He had slightly elevated and flat troponins in ED  - Continue statin, nitrates, beta-blocker   -d/c plavix per cards  Chronic systolic CHF  - Appears compensated  - Hold Lasix for another 24 hours, continue beta-blocker as tolerated    Renal insufficiency  -  SCr is 1.46 on admission, up from 1.28 on 12/23 and 0.81 on 12/6  - Likely mild prerenal azotemia in setting of increased diuretics and GI blood-loss  - resume lasix as able -IV lasix with any blood transfusions   permanent Atrial fibrillation  - Rate was in 110s initially in ED, now normal  - Continue digoxin and metoprolol and hold Xarelto pending further evaluation of GIB   obesity Body mass index is 33.18 kg/m.   Family Communication/Anticipated D/C date and plan/Code Status   DVT prophylaxis: scd Code Status: Full Code.  Disposition Plan: Status is: Inpatient  Remains inpatient appropriate because:Inpatient level of care appropriate due to severity of illness   Dispo: The patient is from: Home              Anticipated d/c is to: Home              Anticipated d/c date is: > 3 days              Patient currently is not medically stable to d/c. can not have colonoscopy prior to Monday          Medical Consultants:    GI  cards  Subjective:   Does not feel when HR goes up  Objective:    Vitals:   04/20/20 0315 04/20/20 0539 04/20/20 0656 04/20/20 0811  BP: 108/64 (!) 99/54  109/67  Pulse: 92 85  95  Resp: 16 15  15   Temp: 98.2 F (36.8 C)  98.1 F (36.7 C)  98.3 F (36.8 C)  TempSrc: Oral Oral  Oral  SpO2: 99% 97%  96%  Weight:   96.1 kg   Height:   5\' 7"  (1.702 m)     Intake/Output Summary (Last 24 hours) at 04/20/2020 1417 Last data filed at 04/20/2020 0735 Gross per 24 hour  Intake 867.17 ml  Output --  Net 867.17 ml   Filed Weights   04/18/20 2330 04/19/20 0500 04/20/20 0656  Weight: 96.8 kg 96.8 kg 96.1 kg    Exam:  General: Appearance:    Obese male in no acute distress     Lungs:     respirations unlabored  Heart:    Normal heart rate.No murmurs, rubs, or gallops.      Neurologic:   Awake, alert, oriented x 3. No apparent focal neurological           defect.     Data Reviewed:   I have personally reviewed following labs  and imaging studies:  Labs: Labs show the following:   Basic Metabolic Panel: Recent Labs  Lab 04/13/20 1604 04/18/20 1311 04/19/20 0048 04/20/20 0026  NA 145* 134* 137 138  K 3.6 4.1 3.5 3.8  CL 104 98 102 104  CO2 27 24 25 27   GLUCOSE 115* 152* 123* 119*  BUN 19 25* 20 18  CREATININE 1.28* 1.46* 1.31* 1.34*  CALCIUM 9.2 8.9 9.0 8.8*   GFR Estimated Creatinine Clearance: 51 mL/min (A) (by C-G formula based on SCr of 1.34 mg/dL (H)). Liver Function Tests: No results for input(s): AST, ALT, ALKPHOS, BILITOT, PROT, ALBUMIN in the last 168 hours. No results for input(s): LIPASE, AMYLASE in the last 168 hours. No results for input(s): AMMONIA in the last 168 hours. Coagulation profile No results for input(s): INR, PROTIME in the last 168 hours.  CBC: Recent Labs  Lab 04/18/20 1311 04/19/20 0048 04/19/20 0801 04/19/20 1503 04/20/20 0026 04/20/20 0920  WBC 7.8  --   --   --  6.3  --   HGB 8.9* 7.5* 7.7* 7.4* 6.9* 8.6*  HCT 27.3* 23.7* 25.0* 23.9* 22.3* 27.2*  MCV 89.8  --   --   --  89.2  --   PLT 316  --   --   --  218  --    Cardiac Enzymes: No results for input(s): CKTOTAL, CKMB, CKMBINDEX, TROPONINI in the last 168 hours. BNP (last 3 results) Recent Labs    03/27/20 0000 04/13/20 1604  PROBNP 77 521*   CBG: No results for input(s): GLUCAP in the last 168 hours. D-Dimer: No results for input(s): DDIMER in the last 72 hours. Hgb A1c: No results for input(s): HGBA1C in the last 72 hours. Lipid Profile: No results for input(s): CHOL, HDL, LDLCALC, TRIG, CHOLHDL, LDLDIRECT in the last 72 hours. Thyroid function studies: No results for input(s): TSH, T4TOTAL, T3FREE, THYROIDAB in the last 72 hours.  Invalid input(s): FREET3 Anemia work up: No results for input(s): VITAMINB12, FOLATE, FERRITIN, TIBC, IRON, RETICCTPCT in the last 72 hours. Sepsis Labs: Recent Labs  Lab 04/18/20 1311 04/20/20 0026  WBC 7.8 6.3    Microbiology Recent Results (from the  past 240 hour(s))  MRSA PCR Screening     Status: None   Collection Time: 04/18/20 12:41 AM   Specimen: Nasopharyngeal  Result Value Ref Range Status   MRSA by PCR NEGATIVE NEGATIVE Final    Comment:        The GeneXpert MRSA Assay (FDA  approved for NASAL specimens only), is one component of a comprehensive MRSA colonization surveillance program. It is not intended to diagnose MRSA infection nor to guide or monitor treatment for MRSA infections. Performed at [facility], [place] [address] [place], [place] [address]   Resp Panel by RT-PCR (Flu A&B, Covid) Nasopharyngeal Swab     Status: None   Collection Time: 04/18/20  4:20 PM   Specimen: Nasopharyngeal Swab; Nasopharyngeal(NP) swabs in vial transport medium  Result Value Ref Range Status   SARS Coronavirus 2 by RT PCR NEGATIVE NEGATIVE Final    Comment: (NOTE) SARS-CoV-2 target nucleic acids are NOT DETECTED.  The SARS-CoV-2 RNA is generally detectable in upper respiratory specimens during the acute phase of infection. The lowest concentration of SARS-CoV-2 viral copies this assay can detect is 138 copies/mL. A negative result does not preclude SARS-Cov-2 infection and should not be used as the sole basis for treatment or other patient management decisions. A negative result may occur with  improper specimen collection/handling, submission of specimen other than nasopharyngeal swab, presence of viral mutation(s) within the areas targeted by this assay, and inadequate number of viral copies(<138 copies/mL). A negative result must be combined with clinical observations, patient history, and epidemiological information. The expected result is Negative.  Fact Sheet for Patients:  EntrepreneurPulse.com.au  Fact Sheet for Healthcare Providers:  IncredibleEmployment.be  This test is no t yet approved or cleared by the [place] FDA and  has been authorized for detection and/or  diagnosis of SARS-CoV-2 by FDA under an Emergency Use Authorization (EUA). This EUA will remain  in effect (meaning this test can be used) for the duration of the COVID-19 declaration under Section 564(b)(1) of the Act, 21 U.S.C.section 360bbb-3(b)(1), unless the authorization is terminated  or revoked sooner.       Influenza A by PCR NEGATIVE NEGATIVE Final   Influenza B by PCR NEGATIVE NEGATIVE Final    Comment: (NOTE) The Xpert Xpress SARS-CoV-2/FLU/RSV plus assay is intended as an aid in the diagnosis of influenza from Nasopharyngeal swab specimens and should not be used as a sole basis for treatment. Nasal washings and aspirates are unacceptable for Xpert Xpress SARS-CoV-2/FLU/RSV testing.  Fact Sheet for Patients: EntrepreneurPulse.com.au  Fact Sheet for Healthcare Providers: IncredibleEmployment.be  This test is not yet approved or cleared by the [place] FDA and has been authorized for detection and/or diagnosis of SARS-CoV-2 by FDA under an Emergency Use Authorization (EUA). This EUA will remain in effect (meaning this test can be used) for the duration of the COVID-19 declaration under Section 564(b)(1) of the Act, 21 U.S.C. section 360bbb-3(b)(1), unless the authorization is terminated or revoked.  Performed at [facility], [address] [place], [place] [address]     Procedures and diagnostic studies:  DG Chest [facility] 1 View  Result Date: 04/18/2020 CLINICAL DATA:  Chest pain and shortness of breath EXAM: PORTABLE CHEST 1 VIEW COMPARISON:  04/13/2020 FINDINGS: Upper normal heart size. The lungs appear clear. Mild atherosclerotic calcification of the aortic arch. Mild thoracic spondylosis. No blunting of the costophrenic angles. 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 IMPRESSION: 1. No active cardiopulmonary disease is radiographically apparent. Electronically Signed   By: Van Clines M.D.   On: 04/18/2020 17:01    Medications:   . diclofenac Sodium  2 g Topical QID  . digoxin  0.25 mg Oral Daily  . ezetimibe  10 mg Oral QPM  . finasteride  5  mg Oral Daily  . gemfibrozil  300 mg Oral BID AC  . metoprolol succinate  25 mg Oral Daily  . minocycline  50 mg Oral Daily  . pantoprazole  40 mg Oral BID  . simvastatin  40 mg Oral QHS  . tamsulosin  0.4 mg Oral Daily  . traZODone  100 mg Oral QHS   Continuous Infusions: . sodium chloride       LOS: 2 days   Joseph Art  [facility]   How to contact the [facility] Attending or Consulting provider 7A - 7P or covering provider during after hours 7P -7A, for this patient?  1. Check the care team in [facility] and look for a) attending/consulting [facility] provider listed and b) the [facility] team listed 2. Log into [email or web address] and use [facility]'s universal password to access. If you do not have the password, please contact the hospital operator. 3. Locate the [facility] provider you are looking for under [facility] and page to a number that you can be directly reached. 4. If you still have difficulty reaching the provider, please page the [facility] (Director on Call) for the Hospitalists listed on amion for assistance.  04/20/2020, 2:17 PM

## 2020-04-20 NOTE — Progress Notes (Signed)
CRITICAL VALUE ALERT  Critical Value:  Hgb:6.9  Date & Time Notied:  04/20/2020 @0026   Provider Notified: Dr.  Orders Received/Actions taken: transfuse 1 unit of RBC

## 2020-04-20 NOTE — Progress Notes (Addendum)
Daily Rounding Note  04/20/2020, 10:13 AM  LOS: 2 days   SUBJECTIVE:   Chief complaint: Anemia.  FOBT positive  Got a unit of blood early this morning.  Feels well.  No dark or bloody stools.  No nausea.  Tolerating solid food.  Not able to tell whether the transfusion and subsequent rise in Hgb has helped his sense of weakness and short of breath because his activity has been limited to the room but when he stands up and moves around in the room he is not having any shortness of breath  OBJECTIVE:         Vital signs in last 24 hours:    Temp:  [98.1 F (36.7 C)-98.4 F (36.9 C)] 98.3 F (36.8 C) (12/30 0811) Pulse Rate:  [78-95] 95 (12/30 0811) Resp:  [14-20] 15 (12/30 0811) BP: (98-145)/(54-68) 109/67 (12/30 0811) SpO2:  [95 %-100 %] 96 % (12/30 0811) Weight:  [96.1 kg] 96.1 kg (12/30 0656) Last BM Date: 04/19/20 Filed Weights   04/18/20 2330 04/19/20 0500 04/20/20 0656  Weight: 96.8 kg 96.8 kg 96.1 kg   General: Looks well Heart: RRR. Chest: Clear bilaterally.  No labored breathing or cough Abdomen: Obese, soft, nontender.  Active bowel sound Extremities: No CCE Neuro/Psych: Oriented x3.  No tremors, no gross deficits.  Fluid speech.  Calm.  Intake/Output from previous day: 12/29 0701 - 12/30 0700 In: 627.2 [I.V.:6.2; Blood:621] Out: -   Intake/Output this shift: Total I/O In: 240 [P.O.:240] Out: -   Lab Results: Recent Labs    04/18/20 1311 04/19/20 0048 04/19/20 1503 04/20/20 0026 04/20/20 0920  WBC 7.8  --   --  6.3  --   HGB 8.9*   < > 7.4* 6.9* 8.6*  HCT 27.3*   < > 23.9* 22.3* 27.2*  PLT 316  --   --  218  --    < > = values in this interval not displayed.   BMET Recent Labs    04/18/20 1311 04/19/20 0048 04/20/20 0026  NA 134* 137 138  K 4.1 3.5 3.8  CL 98 102 104  CO2 24 25 27   GLUCOSE 152* 123* 119*  BUN 25* 20 18  CREATININE 1.46* 1.31* 1.34*  CALCIUM 8.9 9.0 8.8*    LFT No results for input(s): PROT, ALBUMIN, AST, ALT, ALKPHOS, BILITOT, BILIDIR, IBILI in the last 72 hours. PT/INR No results for input(s): LABPROT, INR in the last 72 hours. Hepatitis Panel No results for input(s): HEPBSAG, HCVAB, HEPAIGM, HEPBIGM in the last 72 hours.  Studies/Results: DG Chest [place] 1 View  Result Date: 04/18/2020 CLINICAL DATA:  Chest pain and shortness of breath EXAM: PORTABLE CHEST 1 VIEW COMPARISON:  04/13/2020 FINDINGS: Upper normal heart size. The lungs appear clear. Mild atherosclerotic calcification of the aortic arch. Mild thoracic spondylosis. No blunting of the costophrenic angles. 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 IMPRESSION: 1. No active cardiopulmonary disease is radiographically apparent. Electronically Signed   By: Van Clines M.D.   On: 04/18/2020 17:01    Scheduled Meds: . diclofenac Sodium  2 g Topical QID  . digoxin  0.25 mg Oral Daily  . ezetimibe  10 mg Oral QPM  . finasteride  5 mg Oral Daily  . gemfibrozil  300 mg Oral BID AC  . isosorbide mononitrate  60 mg Oral Daily  . metoprolol succinate  25 mg Oral Daily  . minocycline  50 mg Oral  Daily  . pantoprazole  40 mg Oral BID  . simvastatin  40 mg Oral QHS  . tamsulosin  0.4 mg Oral Daily  . traZODone  100 mg Oral QHS   Continuous Infusions: . sodium chloride     PRN Meds:.sodium chloride, acetaminophen, nitroGLYCERIN, ondansetron **OR** ondansetron (ZOFRAN) IV, simethicone  ASSESMENT:   *   Normocytic anemia.   Hgb 6.9 >> 1 PRBC >> 8.6.    *    Hx Barrett's esophagus.  Compliant with daily Nexium.  Was having regular surveillance EGDs but these were last performed in late 03/2018. No dysphagia or worrisome UGI symptoms.  Protonix 40 mg po bid in place.    *     FOBT positive.  Reports rusty color stool since initiating Xarelto in 04/2019.    *   Significant history coronary artery disease and multiple stents.  Atrial flutter/fib.   Chronic Plavix.  Last dose was AM on 12/28.  Chronic Xarelto last dose PM on 12/27.  Both meds on hold Mild elevation of Trop Is and BNP, likely from demand ischemia.    *   CKD stage 3a.  Not that severe so not likely causing anemia.      PLAN   *   EGD, colonoscopy.  Timing per Dr Chales Abrahams but 5 d hold plavix would be up on 1/3.    *   Note that he is being treated with ongoing topical Voltaren gel. ?  Should be hold this?    Andrew Hardy  04/20/2020, 10:13 AM Phone [phone]    Attending physician's note   I have taken an interval history, reviewed the chart and examined the patient. I agree with the Advanced Practitioner's note, impression and recommendations.   No further GI bleeding. Hb stable. Cardiology input appreciated and reviewed. EGD/colonoscopy after Plavix washout 1/3. After GI procedures, only resume Xarelto.  Will follow peripherally over the weekend.  Will return 1/2 Please call if with any ?/Concerns.   Edman Circle, MD [facility] [phone]

## 2020-04-21 DIAGNOSIS — K922 Gastrointestinal hemorrhage, unspecified: Secondary | ICD-10-CM | POA: Diagnosis not present

## 2020-04-21 DIAGNOSIS — I4821 Permanent atrial fibrillation: Secondary | ICD-10-CM

## 2020-04-21 DIAGNOSIS — I4819 Other persistent atrial fibrillation: Secondary | ICD-10-CM | POA: Diagnosis not present

## 2020-04-21 DIAGNOSIS — N289 Disorder of kidney and ureter, unspecified: Secondary | ICD-10-CM | POA: Diagnosis not present

## 2020-04-21 LAB — CBC
HCT: 27.4 % — ABNORMAL LOW (ref 39.0–52.0)
Hemoglobin: 9 g/dL — ABNORMAL LOW (ref 13.0–17.0)
MCH: 28.8 pg (ref 26.0–34.0)
MCHC: 32.8 g/dL (ref 30.0–36.0)
MCV: 87.5 fL (ref 80.0–100.0)
Platelets: 249 10*3/uL (ref 150–400)
RBC: 3.13 MIL/uL — ABNORMAL LOW (ref 4.22–5.81)
RDW: 13.8 % (ref 11.5–15.5)
WBC: 6.4 10*3/uL (ref 4.0–10.5)
nRBC: 0 % (ref 0.0–0.2)

## 2020-04-21 LAB — BASIC METABOLIC PANEL
Anion gap: 11 (ref 5–15)
BUN: 17 mg/dL (ref 8–23)
CO2: 23 mmol/L (ref 22–32)
Calcium: 8.7 mg/dL — ABNORMAL LOW (ref 8.9–10.3)
Chloride: 104 mmol/L (ref 98–111)
Creatinine, Ser: 1.12 mg/dL (ref 0.61–1.24)
GFR, Estimated: >60 mL/min (ref >60)
Glucose, Bld: 144 mg/dL — ABNORMAL HIGH (ref 70–99)
Potassium: 4 mmol/L (ref 3.5–5.1)
Sodium: 138 mmol/L (ref 135–145)

## 2020-04-21 LAB — TYPE AND SCREEN
ABO/RH(D): A POS
Antibody Screen: NEGATIVE
Unit division: 0

## 2020-04-21 LAB — BPAM RBC
Blood Product Expiration Date: 202201242359
ISSUE DATE / TIME: 202112300251
Unit Type and Rh: 6200

## 2020-04-21 NOTE — Plan of Care (Signed)

## 2020-04-21 NOTE — Care Management Important Message (Signed)
Important Message  Patient Details  Name: Andrew Hardy. MRN: [id number] Date of Birth: [birth year]   Medicare Important Message Given:  Yes     Aldrick Derrig Stefan Church 04/21/2020, 2:39 PM

## 2020-04-21 NOTE — Progress Notes (Signed)
Progress Note    Andrew Hardy.  MD:[id number] DOB: 1940/08/07  DOA: 04/18/2020 PCP: Cyndi Bender, PA-C    Brief Narrative:     Medical records reviewed and are as summarized below:  Andrew Hardy. is an 79 y.o. male with medical history significant for coronary artery disease, chronic systolic CHF, atrial fibrillation on Xarelto, now presenting to the emergency department for evaluation of shortness of breath, chest pressure, and lightheadedness.  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  He has also developed some chest pressure with exertion recently and has been lightheaded when he stands up.  Found to have GI bleed.  Prior to colonoscopy needs plavix washout.    Assessment/Plan:   Principal Problem:   GI bleed Active Problems:   Unspecified atrial fibrillation (HCC)   Coronary artery disease involving native coronary artery of native heart with angina pectoris (HCC)   Symptomatic anemia   Renal insufficiency   Chronic systolic CHF (congestive heart failure) (HCC)    Symptomatic anemia; occult GI bleeding  - Presents with progressive DOE, angina, and lightheadedness on standing and is found to have Hgb 7 (down from 12.3 in April) and positive FOBT  - GI was consulted  - plan for colonoscopy on Monday after plavix wash out (cards consulted for 3 day vs 5 day wash out) -s/p 1 unit PRBC on 12/30  CAD  - Patient reports recent exertional chest pressure, likely secondary to blood-loss anemia  - He had slightly elevated and flat troponins in ED  - Continue statin, nitrates, beta-blocker   -d/c plavix per cards  Chronic systolic CHF  - Appears compensated  - Hold Lasix for another 24 hours, continue beta-blocker as tolerated    Renal insufficiency  -  SCr is 1.46 on admission, up from 1.28 on 12/23 and 0.81 on 12/6  - Likely mild prerenal azotemia in setting of increased diuretics and GI blood-loss  - resume lasix as able -IV lasix with any blood transfusions   permanent Atrial fibrillation  - rates up to 140s but returns to normal - Continue digoxin and metoprolol and hold Xarelto pending further evaluation of GIB   obesity Body mass index is 33.11 kg/m.   Family Communication/Anticipated D/C date and plan/Code Status   DVT prophylaxis: scd Code Status: Full Code.  Disposition Plan: Status is: Inpatient  Remains inpatient appropriate because:Inpatient level of care appropriate due to severity of illness   Dispo: The patient is from: Home              Anticipated d/c is to: Home              Anticipated d/c date is: > 3 days              Patient currently is not medically stable to d/c. can not have colonoscopy prior to Monday          Medical Consultants:    GI  cards  Subjective:   Would like to take a shower Had dark BM this AM  Objective:    Vitals:   04/21/20 0941 04/21/20 1139 04/21/20 1155 04/21/20 1300  BP: 111/72 118/75    Pulse: 94 (!) 106    Resp:  (!) 24 15 17   Temp:  98.3 F (36.8 C)    TempSrc:  Oral    SpO2:      Weight:      Height:        Intake/Output Summary (Last 24 hours) at 04/21/2020 1545 Last data filed at 04/21/2020 1140 Gross per 24 hour  Intake 840 ml  Output --  Net 840 ml   Filed Weights   04/19/20 0500 04/20/20 0656 04/21/20 0327  Weight: 96.8 kg 96.1 kg 95.9 kg    Exam:  General: Appearance:    Obese male in no acute distress     Lungs:     respirations unlabored  Heart:    Tachycardic. irr. No murmurs, rubs, or gallops.      Neurologic:   Awake, alert, oriented x 3. No apparent focal neurological           defect.      Data Reviewed:   I have personally reviewed following labs and imaging studies:  Labs: Labs show the following:   Basic  Metabolic Panel: Recent Labs  Lab 04/18/20 1311 04/19/20 0048 04/20/20 0026 04/21/20 0740  NA 134* 137 138 138  K 4.1 3.5 3.8 4.0  CL 98 102 104 104  CO2 24 25 27 23   GLUCOSE 152* 123* 119* 144*  BUN 25* 20 18 17   CREATININE 1.46* 1.31* 1.34* 1.12  CALCIUM 8.9 9.0 8.8* 8.7*   GFR Estimated Creatinine Clearance: 60.9 mL/min (by C-G formula based on SCr of 1.12 mg/dL). Liver Function Tests: No results for input(s): AST, ALT, ALKPHOS, BILITOT, PROT, ALBUMIN in the last 168 hours. No results for input(s): LIPASE, AMYLASE in the last 168 hours. No results for input(s): AMMONIA in the last 168 hours. Coagulation profile No results for input(s): INR, PROTIME in the last 168 hours.  CBC: Recent Labs  Lab 04/18/20 1311 04/19/20 0048 04/19/20 0801 04/19/20 1503 04/20/20 0026 04/20/20 0920 04/21/20 0740  WBC 7.8  --   --   --  6.3  --  6.4  HGB 8.9*   < > 7.7* 7.4* 6.9* 8.6* 9.0*  HCT 27.3*   < > 25.0* 23.9* 22.3* 27.2* 27.4*  MCV 89.8  --   --   --  89.2  --  87.5  PLT 316  --   --   --  218  --  249   < > = values in this interval not displayed.   Cardiac Enzymes: No results for input(s): CKTOTAL, CKMB, CKMBINDEX, TROPONINI in the last 168 hours. BNP (last 3 results) Recent Labs    03/27/20 0000 04/13/20 1604  PROBNP 77 521*   CBG: No results for input(s): GLUCAP in the last 168 hours. D-Dimer: No results for input(s): DDIMER in the last 72 hours. Hgb A1c: No results for input(s): HGBA1C in the last 72 hours. Lipid Profile: No results for input(s): CHOL, HDL, LDLCALC, TRIG, CHOLHDL, LDLDIRECT in the last 72 hours. Thyroid function studies: No results for input(s): TSH, T4TOTAL, T3FREE, THYROIDAB in the last 72 hours.  Invalid input(s): FREET3 Anemia work up: No results for input(s): VITAMINB12, FOLATE, FERRITIN, TIBC, IRON, RETICCTPCT in the last 72 hours. Sepsis Labs: Recent Labs  Lab 04/18/20 1311 04/20/20 0026 04/21/20 0740  WBC 7.8 6.3 6.4     Microbiology Recent Results (from the past 240 hour(s))  MRSA PCR Screening     Status: None   Collection Time: 04/18/20 12:41 AM   Specimen: Nasopharyngeal  Result Value Ref Range Status   MRSA by PCR  NEGATIVE NEGATIVE Final    Comment:        The GeneXpert MRSA Assay (FDA approved for NASAL specimens only), is one component of a comprehensive MRSA colonization surveillance program. It is not intended to diagnose MRSA infection nor to guide or monitor treatment for MRSA infections. Performed at [facility], [address], [place], [place] [address]   Resp Panel by RT-PCR (Flu A&B, Covid) Nasopharyngeal Swab     Status: None   Collection Time: 04/18/20  4:20 PM   Specimen: Nasopharyngeal Swab; Nasopharyngeal(NP) swabs in vial transport medium  Result Value Ref Range Status   SARS Coronavirus 2 by RT PCR NEGATIVE NEGATIVE Final    Comment: (NOTE) SARS-CoV-2 target nucleic acids are NOT DETECTED.  The SARS-CoV-2 RNA is generally detectable in upper respiratory specimens during the acute phase of infection. The lowest concentration of SARS-CoV-2 viral copies this assay can detect is 138 copies/mL. A negative result does not preclude SARS-Cov-2 infection and should not be used as the sole basis for treatment or other patient management decisions. A negative result may occur with  improper specimen collection/handling, submission of specimen other than nasopharyngeal swab, presence of viral mutation(s) within the areas targeted by this assay, and inadequate number of viral copies(<138 copies/mL). A negative result must be combined with clinical observations, patient history, and epidemiological information. The expected result is Negative.  Fact Sheet for Patients:  BloggerCourse.com  Fact Sheet for Healthcare Providers:  SeriousBroker.it  This test is no t yet approved or cleared by the [place] FDA and   has been authorized for detection and/or diagnosis of SARS-CoV-2 by FDA under an Emergency Use Authorization (EUA). This EUA will remain  in effect (meaning this test can be used) for the duration of the COVID-19 declaration under Section 564(b)(1) of the Act, 21 U.S.C.section 360bbb-3(b)(1), unless the authorization is terminated  or revoked sooner.       Influenza A by PCR NEGATIVE NEGATIVE Final   Influenza B by PCR NEGATIVE NEGATIVE Final    Comment: (NOTE) The Xpert Xpress SARS-CoV-2/FLU/RSV plus assay is intended as an aid in the diagnosis of influenza from Nasopharyngeal swab specimens and should not be used as a sole basis for treatment. Nasal washings and aspirates are unacceptable for Xpert Xpress SARS-CoV-2/FLU/RSV testing.  Fact Sheet for Patients: BloggerCourse.com  Fact Sheet for Healthcare Providers: SeriousBroker.it  This test is not yet approved or cleared by the [place] FDA and has been authorized for detection and/or diagnosis of SARS-CoV-2 by FDA under an Emergency Use Authorization (EUA). This EUA will remain in effect (meaning this test can be used) for the duration of the COVID-19 declaration under Section 564(b)(1) of the Act, 21 U.S.C. section 360bbb-3(b)(1), unless the authorization is terminated or revoked.  Performed at [facility], [address] [place], [place] [address]     Procedures and diagnostic studies:  No results found.  Medications:   . diclofenac Sodium  2 g Topical QID  . digoxin  0.25 mg Oral Daily  . ezetimibe  10 mg Oral QPM  . finasteride  5 mg Oral Daily  . gemfibrozil  300 mg Oral BID AC  . metoprolol succinate  25 mg Oral Daily  . minocycline  50 mg Oral Daily  . pantoprazole  40 mg Oral BID  . simvastatin  40 mg Oral QHS  . tamsulosin  0.4 mg Oral Daily  . traZODone  100 mg Oral QHS   Continuous  Infusions: . sodium chloride       LOS: 3  days   Geradine Girt  [facility]   How to contact the [facility] Attending or Consulting provider [place] or covering provider during after hours [place], for this patient?  1. Check the care team in [facility] and look for a) attending/consulting [facility] provider listed and b) the [facility] team listed 2. Log into [email or web address] and use [place]'s universal password to access. If you do not have the password, please contact the hospital operator. 3. Locate the [facility] provider you are looking for under [facility] and page to a number that you can be directly reached. 4. If you still have difficulty reaching the provider, please page the [facility] (Director on Call) for the Hospitalists listed on amion for assistance.  04/21/2020, 3:45 PM

## 2020-04-21 NOTE — Progress Notes (Signed)
Progress Note  Patient Name: Andrew Hardy. Date of Encounter: 04/21/2020  [facility] Cardiologist: Shirlee More, MD   Subjective   No chest pain or dyspnea  Inpatient Medications    Scheduled Meds: . diclofenac Sodium  2 g Topical QID  . digoxin  0.25 mg Oral Daily  . ezetimibe  10 mg Oral QPM  . finasteride  5 mg Oral Daily  . gemfibrozil  300 mg Oral BID AC  . metoprolol succinate  25 mg Oral Daily  . minocycline  50 mg Oral Daily  . pantoprazole  40 mg Oral BID  . simvastatin  40 mg Oral QHS  . tamsulosin  0.4 mg Oral Daily  . traZODone  100 mg Oral QHS   Continuous Infusions: . sodium chloride     PRN Meds: sodium chloride, acetaminophen, nitroGLYCERIN, ondansetron **OR** ondansetron (ZOFRAN) IV, simethicone   Vital Signs    Vitals:   04/21/20 0327 04/21/20 0400 04/21/20 0500 04/21/20 0600  BP: 113/80     Pulse: 87     Resp: 19 17 17 17   Temp: 97.7 F (36.5 C)     TempSrc: Oral     SpO2: 98%     Weight: 95.9 kg     Height:        Intake/Output Summary (Last 24 hours) at 04/21/2020 0747 Last data filed at 04/20/2020 2000 Gross per 24 hour  Intake 600 ml  Output --  Net 600 ml   Last 3 Weights 04/21/2020 04/20/2020 04/19/2020  Weight (lbs) 211 lb 6.7 oz 211 lb 13.8 oz 213 lb 6.5 oz  Weight (kg) 95.9 kg 96.1 kg 96.8 kg      Telemetry    Atrial fib, rate 100 - Personally Reviewed  ECG    No AM EKG - Personally Reviewed  Physical Exam   GEN: No acute distress.   Neck: No JVD Cardiac: Irreg irreg, no murmurs, rubs, or gallops.  Respiratory: Clear to auscultation bilaterally. GI: Soft, nontender, non-distended  MS: No edema; No deformity. Neuro:  Nonfocal  Psych: Normal affect   Labs    High Sensitivity Troponin:   Recent Labs  Lab 04/18/20 1311 04/18/20 1517  TROPONINIHS 20* 21*      Chemistry Recent Labs  Lab 04/18/20 1311 04/19/20 0048 04/20/20 0026  NA 134* 137 138  K 4.1 3.5 3.8  CL 98 102 104  CO2 24 25 27    GLUCOSE 152* 123* 119*  BUN 25* 20 18  CREATININE 1.46* 1.31* 1.34*  CALCIUM 8.9 9.0 8.8*  GFRNONAA 49* 56* 55*  ANIONGAP 12 10 7      Hematology Recent Labs  Lab 04/18/20 1311 04/19/20 0048 04/19/20 1503 04/20/20 0026 04/20/20 0920  WBC 7.8  --   --  6.3  --   RBC 3.04*  --   --  2.50*  --   HGB 8.9*   < > 7.4* 6.9* 8.6*  HCT 27.3*   < > 23.9* 22.3* 27.2*  MCV 89.8  --   --  89.2  --   MCH 29.3  --   --  27.6  --   MCHC 32.6  --   --  30.9  --   RDW 13.3  --   --  13.5  --   PLT 316  --   --  218  --    < > = values in this interval not displayed.    BNP Recent Labs  Lab 04/18/20 1536  BNP 107.1*     DDimer No results for input(s): DDIMER in the last 168 hours.   Radiology    No results found.  Cardiac Studies     Patient Profile     79 y.o. male male with past medical history of coronary artery disease, persistent atrial fibrillation, hypertension, hyperlipidemia, chronic combined systolic/diastolic congestive heart failure admitted with anemia due to GI bleeding. Plavix and Xarelto on hold.   Assessment & Plan    1. Atrial fib, persistent: Rate controlled. Continue digoxin and metoprolol. Xarelto on hold due to GI bleeding. Resume Xarelto when ok from GI perspective  2. CAD without angina: No chest pain. I would not resume Plavix  3. Chronic combined systolic and diastolic CHF: No volume overload on exam. Resume Lasix as discharge.   4. GI bleeding/anemia: GI following. Plans for EGD/colonoscopy.   We will be available over the weekend but will follow from a distance. Please call with questions.   For questions or updates, please contact [facility] Please consult [email or web address] for contact info under        Signed, Verne Carrow, MD  04/21/2020, 7:47 AM

## 2020-04-22 DIAGNOSIS — N289 Disorder of kidney and ureter, unspecified: Secondary | ICD-10-CM | POA: Diagnosis not present

## 2020-04-22 DIAGNOSIS — K922 Gastrointestinal hemorrhage, unspecified: Secondary | ICD-10-CM | POA: Diagnosis not present

## 2020-04-22 LAB — BASIC METABOLIC PANEL
Anion gap: 9 (ref 5–15)
BUN: 18 mg/dL (ref 8–23)
CO2: 25 mmol/L (ref 22–32)
Calcium: 8.6 mg/dL — ABNORMAL LOW (ref 8.9–10.3)
Chloride: 106 mmol/L (ref 98–111)
Creatinine, Ser: 1.18 mg/dL (ref 0.61–1.24)
GFR, Estimated: >60 mL/min (ref >60)
Glucose, Bld: 125 mg/dL — ABNORMAL HIGH (ref 70–99)
Potassium: 4.3 mmol/L (ref 3.5–5.1)
Sodium: 140 mmol/L (ref 135–145)

## 2020-04-22 LAB — CBC
HCT: 24.2 % — ABNORMAL LOW (ref 39.0–52.0)
Hemoglobin: 8.1 g/dL — ABNORMAL LOW (ref 13.0–17.0)
MCH: 29.5 pg (ref 26.0–34.0)
MCHC: 33.5 g/dL (ref 30.0–36.0)
MCV: 88 fL (ref 80.0–100.0)
Platelets: 228 10*3/uL (ref 150–400)
RBC: 2.75 MIL/uL — ABNORMAL LOW (ref 4.22–5.81)
RDW: 13.8 % (ref 11.5–15.5)
WBC: 6.3 10*3/uL (ref 4.0–10.5)
nRBC: 0 % (ref 0.0–0.2)

## 2020-04-22 MED ORDER — DICLOFENAC SODIUM 1 % EX GEL
2.0000 g | Freq: Four times a day (QID) | CUTANEOUS | Status: DC | PRN
  Filled 2020-04-22: qty 100

## 2020-04-22 NOTE — Progress Notes (Signed)
Progress Note    Andrew Hardy.  [id number] DOB: 08/07/1940  DOA: 04/18/2020 PCP: Cyndi Bender, PA-C    Brief Narrative:     Medical records reviewed and are as summarized below:  Andrew Hardy. is an 80 y.o. male with medical history significant for coronary artery disease, chronic systolic CHF, atrial fibrillation on Xarelto, now presenting to the emergency department for evaluation of shortness of breath, chest pressure, and lightheadedness.  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  He has also developed some chest pressure with exertion recently and has been lightheaded when he stands up.  Found to have GI bleed.  Prior to colonoscopy needs plavix washout.    Assessment/Plan:   Principal Problem:   GI bleed Active Problems:   Unspecified atrial fibrillation (HCC)   Coronary artery disease involving native coronary artery of native heart with angina pectoris (HCC)   Symptomatic anemia   Renal insufficiency   Chronic systolic CHF (congestive heart failure) (HCC)    Symptomatic anemia; occult GI bleeding  - Presents with progressive DOE, angina, and lightheadedness on standing and is found to have Hgb 7 (down from 12.3 in April) and positive FOBT  - GI was consulted  - plan for colonoscopy on Monday after plavix wash out -s/p 1 unit PRBC on 12/30  CAD  - Patient reports recent exertional chest pressure, likely secondary to blood-loss anemia  - He had slightly elevated and flat troponins in ED  - Continue statin, nitrates, beta-blocker   -d/c plavix per cards  Chronic systolic CHF  - Appears compensated  - Hold Lasix for another 24 hours, continue beta-blocker as tolerated    Renal insufficiency  - SCr is 1.46 on admission, up from 1.28 on 12/23  and 0.81 on 12/6  - Likely mild prerenal azotemia in setting of increased diuretics and GI blood-loss  - resume lasix as able -IV lasix with any blood transfusions   permanent Atrial fibrillation  - rates up to 140s but returns to normal - Continue digoxin and metoprolol and hold Xarelto pending further evaluation of GIB   obesity Body mass index is 33.56 kg/m.   Family Communication/Anticipated D/C date and plan/Code Status   DVT prophylaxis: scd Code Status: Full Code.  Disposition Plan: Status is: Inpatient Wife at bedside Remains inpatient appropriate because:Inpatient level of care appropriate due to severity of illness   Dispo: The patient is from: Home              Anticipated d/c is to: Home              Anticipated d/c date is: > 3 days              Patient currently is not medically stable to d/c. can not have colonoscopy prior to Monday          Medical Consultants:    GI  cards  Subjective:   No current complaints  Objective:    Vitals:   04/22/20 0025 04/22/20 0442 04/22/20 0748 04/22/20 0956  BP: (!) 88/65 (!) 99/59 (!) 101/45   Pulse: 88 69 93 95  Resp: 18 18 20    Temp: 97.9 F (36.6 C) 98 F (36.7 C) 98.2 F (36.8 C)   TempSrc: Oral  Oral Oral   SpO2: 98% 99% 99%   Weight:  97.2 kg    Height:        Intake/Output Summary (Last 24 hours) at 04/22/2020 1129 Last data filed at 04/22/2020 0745 Gross per 24 hour  Intake 480 ml  Output --  Net 480 ml   Filed Weights   04/20/20 0656 04/21/20 0327 04/22/20 0442  Weight: 96.1 kg 95.9 kg 97.2 kg    Exam:  General: Appearance:    Obese male in no acute distress     Lungs:     Clear to auscultation bilaterally, respirations unlabored  Heart:    Normal heart rate. irr No murmurs, rubs, or gallops.      Neurologic:   Awake, alert, oriented x 3. No apparent focal neurological           defect.     Data Reviewed:   I have personally reviewed following labs and imaging  studies:  Labs: Labs show the following:   Basic Metabolic Panel: Recent Labs  Lab 04/18/20 1311 04/19/20 0048 04/20/20 0026 04/21/20 0740 04/22/20 0109  NA 134* 137 138 138 140  K 4.1 3.5 3.8 4.0 4.3  CL 98 102 104 104 106  CO2 24 25 27 23 25   GLUCOSE 152* 123* 119* 144* 125*  BUN 25* 20 18 17 18   CREATININE 1.46* 1.31* 1.34* 1.12 1.18  CALCIUM 8.9 9.0 8.8* 8.7* 8.6*   GFR Estimated Creatinine Clearance: 58.2 mL/min (by C-G formula based on SCr of 1.18 mg/dL). Liver Function Tests: No results for input(s): AST, ALT, ALKPHOS, BILITOT, PROT, ALBUMIN in the last 168 hours. No results for input(s): LIPASE, AMYLASE in the last 168 hours. No results for input(s): AMMONIA in the last 168 hours. Coagulation profile No results for input(s): INR, PROTIME in the last 168 hours.  CBC: Recent Labs  Lab 04/18/20 1311 04/19/20 0048 04/19/20 1503 04/20/20 0026 04/20/20 0920 04/21/20 0740 04/22/20 0109  WBC 7.8  --   --  6.3  --  6.4 6.3  HGB 8.9*   < > 7.4* 6.9* 8.6* 9.0* 8.1*  HCT 27.3*   < > 23.9* 22.3* 27.2* 27.4* 24.2*  MCV 89.8  --   --  89.2  --  87.5 88.0  PLT 316  --   --  218  --  249 228   < > = values in this interval not displayed.   Cardiac Enzymes: No results for input(s): CKTOTAL, CKMB, CKMBINDEX, TROPONINI in the last 168 hours. BNP (last 3 results) Recent Labs    03/27/20 0000 04/13/20 1604  PROBNP 77 521*   CBG: No results for input(s): GLUCAP in the last 168 hours. D-Dimer: No results for input(s): DDIMER in the last 72 hours. Hgb A1c: No results for input(s): HGBA1C in the last 72 hours. Lipid Profile: No results for input(s): CHOL, HDL, LDLCALC, TRIG, CHOLHDL, LDLDIRECT in the last 72 hours. Thyroid function studies: No results for input(s): TSH, T4TOTAL, T3FREE, THYROIDAB in the last 72 hours.  Invalid input(s): FREET3 Anemia work up: No results for input(s): VITAMINB12, FOLATE, FERRITIN, TIBC, IRON, RETICCTPCT in the last 72 hours. Sepsis  Labs: Recent Labs  Lab 04/18/20 1311 04/20/20 0026 04/21/20 0740 04/22/20 0109  WBC 7.8 6.3 6.4 6.3    Microbiology Recent Results (from the past 240 hour(s))  MRSA PCR Screening     Status: None   Collection Time: 04/18/20 12:41 AM   Specimen: Nasopharyngeal  Result Value Ref Range Status  MRSA by PCR NEGATIVE NEGATIVE Final    Comment:        The GeneXpert MRSA Assay (FDA approved for NASAL specimens only), is one component of a comprehensive MRSA colonization surveillance program. It is not intended to diagnose MRSA infection nor to guide or monitor treatment for MRSA infections. Performed at [facility], [address], [place], [place] [address]   Resp Panel by RT-PCR (Flu A&B, Covid) Nasopharyngeal Swab     Status: None   Collection Time: 04/18/20  4:20 PM   Specimen: Nasopharyngeal Swab; Nasopharyngeal(NP) swabs in vial transport medium  Result Value Ref Range Status   SARS Coronavirus 2 by RT PCR NEGATIVE NEGATIVE Final    Comment: (NOTE) SARS-CoV-2 target nucleic acids are NOT DETECTED.  The SARS-CoV-2 RNA is generally detectable in upper respiratory specimens during the acute phase of infection. The lowest concentration of SARS-CoV-2 viral copies this assay can detect is 138 copies/mL. A negative result does not preclude SARS-Cov-2 infection and should not be used as the sole basis for treatment or other patient management decisions. A negative result may occur with  improper specimen collection/handling, submission of specimen other than nasopharyngeal swab, presence of viral mutation(s) within the areas targeted by this assay, and inadequate number of viral copies(<138 copies/mL). A negative result must be combined with clinical observations, patient history, and epidemiological information. The expected result is Negative.  Fact Sheet for Patients:  BloggerCourse.com  Fact Sheet for Healthcare Providers:   SeriousBroker.it  This test is no t yet approved or cleared by the [place] FDA and  has been authorized for detection and/or diagnosis of SARS-CoV-2 by FDA under an Emergency Use Authorization (EUA). This EUA will remain  in effect (meaning this test can be used) for the duration of the COVID-19 declaration under Section 564(b)(1) of the Act, 21 U.S.C.section 360bbb-3(b)(1), unless the authorization is terminated  or revoked sooner.       Influenza A by PCR NEGATIVE NEGATIVE Final   Influenza B by PCR NEGATIVE NEGATIVE Final    Comment: (NOTE) The Xpert Xpress SARS-CoV-2/FLU/RSV plus assay is intended as an aid in the diagnosis of influenza from Nasopharyngeal swab specimens and should not be used as a sole basis for treatment. Nasal washings and aspirates are unacceptable for Xpert Xpress SARS-CoV-2/FLU/RSV testing.  Fact Sheet for Patients: BloggerCourse.com  Fact Sheet for Healthcare Providers: SeriousBroker.it  This test is not yet approved or cleared by the [place] FDA and has been authorized for detection and/or diagnosis of SARS-CoV-2 by FDA under an Emergency Use Authorization (EUA). This EUA will remain in effect (meaning this test can be used) for the duration of the COVID-19 declaration under Section 564(b)(1) of the Act, 21 U.S.C. section 360bbb-3(b)(1), unless the authorization is terminated or revoked.  Performed at [facility], [address] [place], [place] [address]     Procedures and diagnostic studies:  No results found.  Medications:   . diclofenac Sodium  2 g Topical QID  . digoxin  0.25 mg Oral Daily  . ezetimibe  10 mg Oral QPM  . finasteride  5 mg Oral Daily  . gemfibrozil  300 mg Oral BID AC  . metoprolol succinate  25 mg Oral Daily  . minocycline  50 mg Oral Daily  . pantoprazole  40 mg Oral BID  . simvastatin  40 mg Oral QHS  .  tamsulosin  0.4 mg Oral Daily  . traZODone  100 mg Oral  QHS   Continuous Infusions: . sodium chloride       LOS: 4 days   Geradine Girt  [facility]   How to contact the [facility] Attending or Consulting provider [place] or covering provider during after hours [place], for this patient?  1. Check the care team in [facility] and look for a) attending/consulting TRH provider listed and b) the [facility] team listed 2. Log into [email or web address] and use [place]'s universal password to access. If you do not have the password, please contact the hospital operator. 3. Locate the [facility] provider you are looking for under [facility] and page to a number that you can be directly reached. 4. If you still have difficulty reaching the provider, please page the [facility] (Director on Call) for the Hospitalists listed on amion for assistance.  04/22/2020, 11:29 AM

## 2020-04-23 DIAGNOSIS — K922 Gastrointestinal hemorrhage, unspecified: Secondary | ICD-10-CM | POA: Diagnosis not present

## 2020-04-23 LAB — CBC
HCT: 25.2 % — ABNORMAL LOW (ref 39.0–52.0)
Hemoglobin: 8.1 g/dL — ABNORMAL LOW (ref 13.0–17.0)
MCH: 28.5 pg (ref 26.0–34.0)
MCHC: 32.1 g/dL (ref 30.0–36.0)
MCV: 88.7 fL (ref 80.0–100.0)
Platelets: 240 10*3/uL (ref 150–400)
RBC: 2.84 MIL/uL — ABNORMAL LOW (ref 4.22–5.81)
RDW: 13.8 % (ref 11.5–15.5)
WBC: 7 10*3/uL (ref 4.0–10.5)
nRBC: 0 % (ref 0.0–0.2)

## 2020-04-23 LAB — BASIC METABOLIC PANEL
Anion gap: 8 (ref 5–15)
BUN: 16 mg/dL (ref 8–23)
CO2: 24 mmol/L (ref 22–32)
Calcium: 8.7 mg/dL — ABNORMAL LOW (ref 8.9–10.3)
Chloride: 109 mmol/L (ref 98–111)
Creatinine, Ser: 1.19 mg/dL (ref 0.61–1.24)
GFR, Estimated: >60 mL/min (ref >60)
Glucose, Bld: 110 mg/dL — ABNORMAL HIGH (ref 70–99)
Potassium: 4.3 mmol/L (ref 3.5–5.1)
Sodium: 141 mmol/L (ref 135–145)

## 2020-04-23 MED ORDER — PEG-KCL-NACL-NASULF-NA ASC-C 100 G PO SOLR: 1.0000 | Freq: Once | ORAL | Status: DC

## 2020-04-23 MED ORDER — PEG-KCL-NACL-NASULF-NA ASC-C 100 G PO SOLR
0.5000 | Freq: Once | ORAL | Status: AC
  Administered 2020-04-23: 100 g via ORAL
  Filled 2020-04-23: qty 1

## 2020-04-23 MED ORDER — PEG-KCL-NACL-NASULF-NA ASC-C 100 G PO SOLR
0.5000 | Freq: Once | ORAL | Status: AC
  Administered 2020-04-24: 100 g via ORAL
  Filled 2020-04-23: qty 1

## 2020-04-23 NOTE — Progress Notes (Signed)
     Progress Note    ASSESSMENT AND PLAN:   GI bleed (FOBT pos). Hb 12 (April 2021) to 6.9 s/p 1U to 8.  Stable since. H/O Barrett's esophagus (being followed by Dr. Misenheimer) CAD s/p DES. A fib on Plavix (last dose 12/28)/Xarelto (last dos 12/27) CKD  Plan: -EGD/colonoscopy in a.m. after Plavix washout with Dr. Pyrtle @ 8:30 AM .  I explained risks and benefits. -After GI procedures , resume only Xarelto per cardiology. -Resume Nexium at D/C. -D/W pt and pts wife.     SUBJECTIVE   No further bleeding. Eating well. No nausea, vomiting, abdominal pain. No melena or hematochezia    OBJECTIVE:     Vital signs in last 24 hours: Temp:  [97.5 F (36.4 C)-98.7 F (37.1 C)] 98.3 F (36.8 C) (01/02 0707) Pulse Rate:  [80-121] 98 (01/02 0736) Resp:  [17-20] 17 (01/02 0707) BP: (103-116)/(51-95) 115/95 (01/02 0300) SpO2:  [97 %-99 %] 99 % (01/02 0300) Weight:  [97.8 kg] 97.8 kg (01/02 0300) Last BM Date: 04/21/20 General:   Alert, well-developed male in NAD EENT:  Normal hearing, non icteric sclera, conjunctive pink.  Heart:  no murmur.  No lower extremity edema   Pulm: Normal respiratory effort, lungs CTA bilaterally without wheezes or crackles. Abdomen:  Soft, nondistended, nontender.  Normal bowel sounds,.       Neurologic:  Alert and  oriented x4;  grossly normal neurologically. Psych:  Pleasant, cooperative.  Normal mood and affect.   Intake/Output from previous day: 01/01 0701 - 01/02 0700 In: 720 [P.O.:720] Out: -  Intake/Output this shift: Total I/O In: 480 [P.O.:480] Out: -   Lab Results: Recent Labs    04/21/20 0740 04/22/20 0109 04/23/20 0106  WBC 6.4 6.3 7.0  HGB 9.0* 8.1* 8.1*  HCT 27.4* 24.2* 25.2*  PLT 249 228 240   BMET Recent Labs    04/21/20 0740 04/22/20 0109 04/23/20 0106  NA 138 140 141  K 4.0 4.3 4.3  CL 104 106 109  CO2 23 25 24  GLUCOSE 144* 125* 110*  BUN 17 18 16  CREATININE 1.12 1.18 1.19  CALCIUM 8.7* 8.6*  8.7*   LFT No results for input(s): PROT, ALBUMIN, AST, ALT, ALKPHOS, BILITOT, BILIDIR, IBILI in the last 72 hours. PT/INR No results for input(s): LABPROT, INR in the last 72 hours. Hepatitis Panel No results for input(s): HEPBSAG, HCVAB, HEPAIGM, HEPBIGM in the last 72 hours.  No results found.   Principal Problem:   GI bleed Active Problems:   Unspecified atrial fibrillation (HCC)   Coronary artery disease involving native coronary artery of native heart with angina pectoris (HCC)   Symptomatic anemia   Renal insufficiency   Chronic systolic CHF (congestive heart failure) (HCC)     LOS: 5 days     Raj Najeh Credit, MD 04/23/2020, 10:24 AM [facility] [phone]       

## 2020-04-23 NOTE — Progress Notes (Signed)
Progress Note    Andrew Hardy.  YIF:[id number] DOB: Jun 20, 1940  DOA: 04/18/2020 PCP: Cyndi Bender, PA-C    Brief Narrative:     Medical records reviewed and are as summarized below:  Andrew Hardy. is an 80 y.o. male with medical history significant for coronary artery disease, chronic systolic CHF, atrial fibrillation on Xarelto, now presenting to the emergency department for evaluation of shortness of breath, chest pressure, and lightheadedness.  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  He has also developed some chest pressure with exertion recently and has been lightheaded when he stands up.  Found to have GI bleed.  Prior to colonoscopy needs plavix washout.    Assessment/Plan:   Principal Problem:   GI bleed Active Problems:   Unspecified atrial fibrillation (HCC)   Coronary artery disease involving native coronary artery of native heart with angina pectoris (HCC)   Symptomatic anemia   Renal insufficiency   Chronic systolic CHF (congestive heart failure) (HCC)    Symptomatic anemia; occult GI bleeding  - Presents with progressive DOE, angina, and lightheadedness on standing and is found to have Hgb 7 (down from 12.3 in April) and positive FOBT  - GI was consulted  - plan for colonoscopy on Monday after plavix wash out -s/p 1 unit PRBC on 12/30  CAD  - Patient reports recent exertional chest pressure, likely secondary to blood-loss anemia  - He had slightly elevated and flat troponins in ED  - Continue statin, nitrates, beta-blocker   -d/c plavix per cards  Chronic systolic CHF  - Appears compensated  - Hold Lasix for another 24 hours, continue beta-blocker as tolerated    Renal insufficiency  - SCr is 1.46 on admission, up from 1.28 on 12/23  and 0.81 on 12/6  - Likely mild prerenal azotemia in setting of increased diuretics and GI blood-loss  - resume lasix as able -IV lasix with any blood transfusions   permanent Atrial fibrillation  - rates up to 140s but returns to normal - Continue digoxin and metoprolol and hold Xarelto pending further evaluation of GIB   obesity Body mass index is 33.77 kg/m.   Family Communication/Anticipated D/C date and plan/Code Status   DVT prophylaxis: scd Code Status: Full Code.  Disposition Plan: Status is: Inpatient Wife at bedside Remains inpatient appropriate because:Inpatient level of care appropriate due to severity of illness   Dispo: The patient is from: Home              Anticipated d/c is to: Home              Anticipated d/c date is: 1-2 days              Patient currently is not medically stable to d/c. can not have colonoscopy prior to Monday          Medical Consultants:    GI  cards  Subjective:   No further bleeding today  Objective:    Vitals:   04/23/20 0300 04/23/20 0707 04/23/20 0736 04/23/20 1143  BP: (!) 115/95   (!) 107/52  Pulse: 80 (!) 121 98 81  Resp: 19 17    Temp: (!) 97.5 F (36.4 C) 98.3 F (36.8 C)  98 F (36.7 C)  TempSrc:  Axillary Oral  Oral  SpO2: 99%     Weight: 97.8 kg     Height:        Intake/Output Summary (Last 24 hours) at 04/23/2020 1508 Last data filed at 04/23/2020 1200 Gross per 24 hour  Intake 960 ml  Output -  Net 960 ml   Filed Weights   04/21/20 0327 04/22/20 0442 04/23/20 0300  Weight: 95.9 kg 97.2 kg 97.8 kg    Exam:  General: Appearance:    Obese male in no acute distress     Lungs:     respirations unlabored  Heart:    Normal heart rate.  Irregular.  No murmurs, rubs, or gallops.      Neurologic:   Awake, alert, oriented x 3. No apparent focal neurological           defect.     Data Reviewed:   I have personally reviewed following labs and imaging studies:  Labs: Labs show the  following:   Basic Metabolic Panel: Recent Labs  Lab 04/19/20 0048 04/20/20 0026 04/21/20 0740 04/22/20 0109 04/23/20 0106  NA 137 138 138 140 141  K 3.5 3.8 4.0 4.3 4.3  CL 102 104 104 106 109  CO2 _0 GLUCOSE 123* 119* 144* 125* 110*  BUN _1 CREATININE 1.31* 1.34* 1.12 1.18 1.19  CALCIUM 9.0 8.8* 8.7* 8.6* 8.7*   GFR Estimated Creatinine Clearance: 57.9 mL/min (by C-G formula based on SCr of 1.19 mg/dL). Liver Function Tests: No results for input(s): AST, ALT, ALKPHOS, BILITOT, PROT, ALBUMIN in the last 168 hours. No results for input(s): LIPASE, AMYLASE in the last 168 hours. No results for input(s): AMMONIA in the last 168 hours. Coagulation profile No results for input(s): INR, PROTIME in the last 168 hours.  CBC: Recent Labs  Lab 04/18/20 1311 04/19/20 0048 04/20/20 0026 04/20/20 0920 04/21/20 0740 04/22/20 0109 04/23/20 0106  WBC 7.8  --  6.3  --  6.4 6.3 7.0  HGB 8.9*   < > 6.9* 8.6* 9.0* 8.1* 8.1*  HCT 27.3*   < > 22.3* 27.2* 27.4* 24.2* 25.2*  MCV 89.8  --  89.2  --  87.5 88.0 88.7  PLT 316  --  218  --  249 228 240   < > = values in this interval not displayed.   Cardiac Enzymes: No results for input(s): CKTOTAL, CKMB, CKMBINDEX, TROPONINI in the last 168 hours. BNP (last 3 results) Recent Labs    03/27/20 0000 04/13/20 1604  PROBNP 77 521*   CBG: No results for input(s): GLUCAP in the last 168 hours. D-Dimer: No results for input(s): DDIMER in the last 72 hours. Hgb A1c: No results for input(s): HGBA1C in the last 72 hours. Lipid Profile: No results for input(s): CHOL, HDL, LDLCALC, TRIG, CHOLHDL, LDLDIRECT in the last 72 hours. Thyroid function studies: No results for input(s): TSH, T4TOTAL, T3FREE, THYROIDAB in the last 72 hours.  Invalid input(s): FREET3 Anemia work up: No results for input(s): VITAMINB12, FOLATE, FERRITIN, TIBC, IRON, RETICCTPCT in the last 72 hours. Sepsis Labs: Recent Labs  Lab  04/20/20 0026 04/21/20 0740 04/22/20 0109 04/23/20 0106  WBC 6.3 6.4 6.3 7.0    Microbiology Recent Results (from the past 240 hour(s))  MRSA PCR Screening     Status: None   Collection Time: 04/18/20 12:41 AM   Specimen: Nasopharyngeal  Result Value Ref Range Status   MRSA by PCR NEGATIVE NEGATIVE Final  Comment:        The GeneXpert MRSA Assay (FDA approved for NASAL specimens only), is one component of a comprehensive MRSA colonization surveillance program. It is not intended to diagnose MRSA infection nor to guide or monitor treatment for MRSA infections. Performed at [facility], [place] [address], [place], [place] [address]   Resp Panel by RT-PCR (Flu A&B, Covid) Nasopharyngeal Swab     Status: None   Collection Time: 04/18/20  4:20 PM   Specimen: Nasopharyngeal Swab; Nasopharyngeal(NP) swabs in vial transport medium  Result Value Ref Range Status   SARS Coronavirus 2 by RT PCR NEGATIVE NEGATIVE Final    Comment: (NOTE) SARS-CoV-2 target nucleic acids are NOT DETECTED.  The SARS-CoV-2 RNA is generally detectable in upper respiratory specimens during the acute phase of infection. The lowest concentration of SARS-CoV-2 viral copies this assay can detect is 138 copies/mL. A negative result does not preclude SARS-Cov-2 infection and should not be used as the sole basis for treatment or other patient management decisions. A negative result may occur with  improper specimen collection/handling, submission of specimen other than nasopharyngeal swab, presence of viral mutation(s) within the areas targeted by this assay, and inadequate number of viral copies(<138 copies/mL). A negative result must be combined with clinical observations, patient history, and epidemiological information. The expected result is Negative.  Fact Sheet for Patients:  EntrepreneurPulse.com.au  Fact Sheet for Healthcare Providers:   IncredibleEmployment.be  This test is no t yet approved or cleared by the [place] FDA and  has been authorized for detection and/or diagnosis of SARS-CoV-2 by FDA under an Emergency Use Authorization (EUA). This EUA will remain  in effect (meaning this test can be used) for the duration of the COVID-19 declaration under Section 564(b)(1) of the Act, 21 U.S.C.section 360bbb-3(b)(1), unless the authorization is terminated  or revoked sooner.       Influenza A by PCR NEGATIVE NEGATIVE Final   Influenza B by PCR NEGATIVE NEGATIVE Final    Comment: (NOTE) The Xpert Xpress SARS-CoV-2/FLU/RSV plus assay is intended as an aid in the diagnosis of influenza from Nasopharyngeal swab specimens and should not be used as a sole basis for treatment. Nasal washings and aspirates are unacceptable for Xpert Xpress SARS-CoV-2/FLU/RSV testing.  Fact Sheet for Patients: EntrepreneurPulse.com.au  Fact Sheet for Healthcare Providers: IncredibleEmployment.be  This test is not yet approved or cleared by the [place] FDA and has been authorized for detection and/or diagnosis of SARS-CoV-2 by FDA under an Emergency Use Authorization (EUA). This EUA will remain in effect (meaning this test can be used) for the duration of the COVID-19 declaration under Section 564(b)(1) of the Act, 21 U.S.C. section 360bbb-3(b)(1), unless the authorization is terminated or revoked.  Performed at [facility], [address] [place], [place] [address]     Procedures and diagnostic studies:  No results found.  Medications:   . digoxin  0.25 mg Oral Daily  . ezetimibe  10 mg Oral QPM  . finasteride  5 mg Oral Daily  . gemfibrozil  300 mg Oral BID AC  . metoprolol succinate  25 mg Oral Daily  . minocycline  50 mg Oral Daily  . pantoprazole  40 mg Oral BID  . peg 3350 powder  0.5 kit Oral Once   And  . [START ON 04/24/2020] peg 3350  powder  0.5 kit Oral Once  . simvastatin  40 mg Oral QHS  . tamsulosin  0.4 mg Oral  Daily  . traZODone  100 mg Oral QHS   Continuous Infusions: . sodium chloride       LOS: 5 days   Geradine Girt  [facility]   How to contact the [facility] Attending or Consulting provider [place] or covering provider during after hours [place], for this patient?  1. Check the care team in [facility] and look for a) attending/consulting TRH provider listed and b) the [facility] team listed 2. Log into [email or web address] and use [place]'s universal password to access. If you do not have the password, please contact the hospital operator. 3. Locate the [facility] provider you are looking for under [facility] and page to a number that you can be directly reached. 4. If you still have difficulty reaching the provider, please page the [facility] (Director on Call) for the Hospitalists listed on amion for assistance.  04/23/2020, 3:08 PM

## 2020-04-23 NOTE — Plan of Care (Signed)

## 2020-04-23 NOTE — H&P (View-Only) (Signed)
     Progress Note    ASSESSMENT AND PLAN:   GI bleed (FOBT pos). Hb 12 (April 2021) to 6.9 s/p 1U to 8.  Stable since. H/O Barrett's esophagus (being followed by Dr. Jennye Boroughs) CAD s/p DES. A fib on Plavix (last dose 12/28)/Xarelto (last dos 12/27) CKD  Plan: -EGD/colonoscopy in a.m. after Plavix washout with Dr. Rhea Belton @ 8:30 AM .  I explained risks and benefits. -After GI procedures , resume only Xarelto per cardiology. -Resume Nexium at D/C. -D/W pt and pts wife.     SUBJECTIVE   No further bleeding. Eating well. No nausea, vomiting, abdominal pain. No melena or hematochezia    OBJECTIVE:     Vital signs in last 24 hours: Temp:  [97.5 F (36.4 C)-98.7 F (37.1 C)] 98.3 F (36.8 C) (01/02 0707) Pulse Rate:  [80-121] 98 (01/02 0736) Resp:  [17-20] 17 (01/02 0707) BP: (103-116)/(51-95) 115/95 (01/02 0300) SpO2:  [97 %-99 %] 99 % (01/02 0300) Weight:  [97.8 kg] 97.8 kg (01/02 0300) Last BM Date: 04/21/20 General:   Alert, well-developed male in NAD EENT:  Normal hearing, non icteric sclera, conjunctive pink.  Heart:  no murmur.  No lower extremity edema   Pulm: Normal respiratory effort, lungs CTA bilaterally without wheezes or crackles. Abdomen:  Soft, nondistended, nontender.  Normal bowel sounds,.       Neurologic:  Alert and  oriented x4;  grossly normal neurologically. Psych:  Pleasant, cooperative.  Normal mood and affect.   Intake/Output from previous day: 01/01 0701 - 01/02 0700 In: 720 [P.O.:720] Out: -  Intake/Output this shift: Total I/O In: 480 [P.O.:480] Out: -   Lab Results: Recent Labs    04/21/20 0740 04/22/20 0109 04/23/20 0106  WBC 6.4 6.3 7.0  HGB 9.0* 8.1* 8.1*  HCT 27.4* 24.2* 25.2*  PLT 249 228 240   BMET Recent Labs    04/21/20 0740 04/22/20 0109 04/23/20 0106  NA 138 140 141  K 4.0 4.3 4.3  CL 104 106 109  CO2 23 25 24   GLUCOSE 144* 125* 110*  BUN 17 18 16   CREATININE 1.12 1.18 1.19  CALCIUM 8.7* 8.6*  8.7*   LFT No results for input(s): PROT, ALBUMIN, AST, ALT, ALKPHOS, BILITOT, BILIDIR, IBILI in the last 72 hours. PT/INR No results for input(s): LABPROT, INR in the last 72 hours. Hepatitis Panel No results for input(s): HEPBSAG, HCVAB, HEPAIGM, HEPBIGM in the last 72 hours.  No results found.   Principal Problem:   GI bleed Active Problems:   Unspecified atrial fibrillation (HCC)   Coronary artery disease involving native coronary artery of native heart with angina pectoris (HCC)   Symptomatic anemia   Renal insufficiency   Chronic systolic CHF (congestive heart failure) (HCC)     LOS: 5 days     , MD 04/23/2020, 10:24 AM [facility] [phone]

## 2020-04-24 ENCOUNTER — Inpatient Hospital Stay (HOSPITAL_COMMUNITY): Payer: Medicare Other | Admitting: Certified Registered"

## 2020-04-24 ENCOUNTER — Encounter (HOSPITAL_COMMUNITY)
Admission: EM | Disposition: A | Discharge status: Home / Self Care | Payer: Self-pay | Source: Home / Self Care | Attending: Internal Medicine

## 2020-04-24 ENCOUNTER — Encounter (HOSPITAL_COMMUNITY): Payer: Self-pay | Admitting: Internal Medicine

## 2020-04-24 DIAGNOSIS — D122 Benign neoplasm of ascending colon: Secondary | ICD-10-CM

## 2020-04-24 DIAGNOSIS — R195 Other fecal abnormalities: Secondary | ICD-10-CM

## 2020-04-24 DIAGNOSIS — K635 Polyp of colon: Secondary | ICD-10-CM

## 2020-04-24 DIAGNOSIS — K921 Melena: Secondary | ICD-10-CM

## 2020-04-24 DIAGNOSIS — K922 Gastrointestinal hemorrhage, unspecified: Secondary | ICD-10-CM | POA: Diagnosis not present

## 2020-04-24 DIAGNOSIS — K2289 Other specified disease of esophagus: Secondary | ICD-10-CM

## 2020-04-24 DIAGNOSIS — I4819 Other persistent atrial fibrillation: Secondary | ICD-10-CM | POA: Diagnosis not present

## 2020-04-24 DIAGNOSIS — D62 Acute posthemorrhagic anemia: Secondary | ICD-10-CM

## 2020-04-24 DIAGNOSIS — K5521 Angiodysplasia of colon with hemorrhage: Secondary | ICD-10-CM

## 2020-04-24 DIAGNOSIS — K22719 Barrett's esophagus with dysplasia, unspecified: Secondary | ICD-10-CM

## 2020-04-24 HISTORY — PX: BIOPSY: SHX5522

## 2020-04-24 HISTORY — PX: COLONOSCOPY WITH PROPOFOL: SHX5780

## 2020-04-24 HISTORY — PX: POLYPECTOMY: SHX5525

## 2020-04-24 HISTORY — PX: HEMOSTASIS CONTROL: SHX6838

## 2020-04-24 HISTORY — PX: ESOPHAGOGASTRODUODENOSCOPY (EGD) WITH PROPOFOL: SHX5813

## 2020-04-24 LAB — CBC WITH DIFFERENTIAL/PLATELET
Abs Immature Granulocytes: 0.02 10*3/uL (ref 0.00–0.07)
Basophils Absolute: 0.1 10*3/uL (ref 0.0–0.1)
Basophils Relative: 1 %
Eosinophils Absolute: 0.1 10*3/uL (ref 0.0–0.5)
Eosinophils Relative: 2 %
HCT: 27.4 % — ABNORMAL LOW (ref 39.0–52.0)
Hemoglobin: 8.4 g/dL — ABNORMAL LOW (ref 13.0–17.0)
Immature Granulocytes: 0 %
Lymphocytes Relative: 18 %
Lymphs Abs: 1.2 10*3/uL (ref 0.7–4.0)
MCH: 27.5 pg (ref 26.0–34.0)
MCHC: 30.7 g/dL (ref 30.0–36.0)
MCV: 89.5 fL (ref 80.0–100.0)
Monocytes Absolute: 0.7 10*3/uL (ref 0.1–1.0)
Monocytes Relative: 11 %
Neutro Abs: 4.5 10*3/uL (ref 1.7–7.7)
Neutrophils Relative %: 68 %
Platelets: 248 10*3/uL (ref 150–400)
RBC: 3.06 MIL/uL — ABNORMAL LOW (ref 4.22–5.81)
RDW: 14 % (ref 11.5–15.5)
WBC: 6.7 10*3/uL (ref 4.0–10.5)
nRBC: 0 % (ref 0.0–0.2)

## 2020-04-24 LAB — COMPREHENSIVE METABOLIC PANEL
ALT: 15 U/L (ref 0–44)
AST: 21 U/L (ref 15–41)
Albumin: 3.5 g/dL (ref 3.5–5.0)
Alkaline Phosphatase: 45 U/L (ref 38–126)
Anion gap: 9 (ref 5–15)
BUN: 11 mg/dL (ref 8–23)
CO2: 21 mmol/L — ABNORMAL LOW (ref 22–32)
Calcium: 8.5 mg/dL — ABNORMAL LOW (ref 8.9–10.3)
Chloride: 108 mmol/L (ref 98–111)
Creatinine, Ser: 1.08 mg/dL (ref 0.61–1.24)
GFR, Estimated: >60 mL/min (ref >60)
Glucose, Bld: 112 mg/dL — ABNORMAL HIGH (ref 70–99)
Potassium: 3.9 mmol/L (ref 3.5–5.1)
Sodium: 138 mmol/L (ref 135–145)
Total Bilirubin: 1.1 mg/dL (ref 0.3–1.2)
Total Protein: 6.3 g/dL — ABNORMAL LOW (ref 6.5–8.1)

## 2020-04-24 SURGERY — COLONOSCOPY WITH PROPOFOL
Anesthesia: Monitor Anesthesia Care

## 2020-04-24 SURGERY — ESOPHAGOGASTRODUODENOSCOPY (EGD) WITH PROPOFOL
Anesthesia: Monitor Anesthesia Care

## 2020-04-24 MED ORDER — PHENYLEPHRINE 40 MCG/ML (10ML) SYRINGE FOR IV PUSH (FOR BLOOD PRESSURE SUPPORT)
PREFILLED_SYRINGE | INTRAVENOUS | Status: DC | PRN
  Administered 2020-04-24: 120 ug via INTRAVENOUS
  Administered 2020-04-24: 80 ug via INTRAVENOUS
  Administered 2020-04-24: 120 ug via INTRAVENOUS

## 2020-04-24 MED ORDER — SODIUM CHLORIDE 0.9 % IV SOLN: INTRAVENOUS | Status: DC

## 2020-04-24 MED ORDER — PHENYLEPHRINE HCL-NACL 10-0.9 MG/250ML-% IV SOLN
INTRAVENOUS | Status: DC | PRN
  Administered 2020-04-24: 30 ug/min via INTRAVENOUS

## 2020-04-24 MED ORDER — ONDANSETRON HCL 4 MG/2ML IJ SOLN
INTRAMUSCULAR | Status: DC | PRN
  Administered 2020-04-24: 4 mg via INTRAVENOUS

## 2020-04-24 MED ORDER — RIVAROXABAN 20 MG PO TABS: 20.0000 mg | ORAL_TABLET | Freq: Every day | ORAL | Status: DC

## 2020-04-24 MED ORDER — LACTATED RINGERS IV SOLN: INTRAVENOUS | Status: DC | PRN

## 2020-04-24 MED ORDER — EPHEDRINE SULFATE-NACL 50-0.9 MG/10ML-% IV SOSY
PREFILLED_SYRINGE | INTRAVENOUS | Status: DC | PRN
  Administered 2020-04-24 (×2): 10 mg via INTRAVENOUS

## 2020-04-24 MED ORDER — PROPOFOL 500 MG/50ML IV EMUL
INTRAVENOUS | Status: DC | PRN
  Administered 2020-04-24: 100 ug/kg/min via INTRAVENOUS

## 2020-04-24 MED ORDER — PANTOPRAZOLE SODIUM 40 MG PO TBEC: 40.0000 mg | DELAYED_RELEASE_TABLET | Freq: Every day | ORAL | Status: DC

## 2020-04-24 SURGICAL SUPPLY — 25 items

## 2020-04-24 NOTE — Op Note (Signed)
[facility] Patient Name: Andrew Hardy Procedure Date : 04/24/2020 MRN: [id number] Attending MD: Jerene Bears , MD Date of Birth: August 04, 1940 CSN: [id number] Age: 80 Admit Type: Inpatient Procedure:                Upper GI endoscopy Indications:              Acute post hemorrhagic anemia, Heme positive stool Providers:                Lajuan Lines. Hilarie Fredrickson, MD, Erenest Rasher, RN, Cherylynn Ridges, Technician Referring MD:             [facility] Medicines:                Monitored Anesthesia Care Complications:            No immediate complications. Estimated Blood Loss:     Estimated blood loss was minimal. Procedure:                Pre-Anesthesia Assessment:                           - Prior to the procedure, a History and Physical                            was performed, and patient medications and                            allergies were reviewed. The patient's tolerance of                            previous anesthesia was also reviewed. The risks                            and benefits of the procedure and the sedation                            options and risks were discussed with the patient.                            All questions were answered, and informed consent                            was obtained. Prior Anticoagulants: The patient has                            taken Xarelto (rivaroxaban), last dose was 5 days                            prior to procedure. ASA Grade Assessment: III - A                            patient with severe systemic disease. After  reviewing the risks and benefits, the patient was                            deemed in satisfactory condition to undergo the                            procedure.                           After obtaining informed consent, the endoscope was                            passed under direct vision. Throughout the                            procedure,  the patient's blood pressure, pulse, and                            oxygen saturations were monitored continuously. The                            [id number]) Olympus gastroscope was                            introduced through the mouth, and advanced to the                            second part of duodenum. The upper GI endoscopy was                            accomplished without difficulty. The patient                            tolerated the procedure well. Scope In: Scope Out: Findings:      There were esophageal mucosal changes suggestive of short-segment       Barrett's esophagus present at the gastroesophageal junction (40-41 cm       from the incisors). The maximum longitudinal extent of these mucosal       changes was 1 cm in length and consist of 2 short tongues (no       circumferential segment). Mucosa was biopsied with a cold forceps for       histology in a targeted manner at intervals of 0.5 cm. One specimen       bottle was sent to pathology.      The gastroesophageal flap valve was visualized endoscopically and       classified as Hill Grade II (fold present, opens with respiration).      The entire examined stomach was normal.      The examined duodenum was normal. Impression:               - Esophageal mucosal changes suggestive of                            short-segment Barrett's esophagus. Biopsied.                           -  Normal stomach.                           - Normal examined duodenum. Moderate Sedation:      N/A Recommendation:           - Return patient to hospital ward for ongoing care.                           - Resume previous diet.                           - Resume Xarelto (rivaroxaban) at prior dose in 2                            days. Refer to managing physician for further                            adjustment of therapy.                           - Await pathology results.                           - Daily PPI is recommended.                            - See colonoscopy report done same day. Procedure Code(s):        --- Professional ---                           [phone], Esophagogastroduodenoscopy, flexible,                            transoral; with biopsy, single or multiple Diagnosis Code(s):        --- Professional ---                           K22.8, Other specified diseases of esophagus                           D62, Acute posthemorrhagic anemia                           R19.5, Other fecal abnormalities CPT copyright 2019 American [employer]. All rights reserved. The codes documented in this report are preliminary and upon coder review may  be revised to meet current compliance requirements. Beverley Fiedler, MD 04/24/2020 10:10:55 AM This report has been signed electronically. Number of Addenda: 0

## 2020-04-24 NOTE — Anesthesia Preprocedure Evaluation (Signed)
Anesthesia Evaluation  Patient identified by MRN, date of birth, ID band Patient awake    Reviewed: Allergy & Precautions, NPO status , Patient's Chart, lab work & pertinent test results, reviewed documented beta blocker date and time   History of Anesthesia Complications Negative for: history of anesthetic complications  Airway Mallampati: II  TM Distance: >3 FB Neck ROM: Full    Dental   Pulmonary neg pulmonary ROS, former smoker,    Pulmonary exam normal        Cardiovascular hypertension, Pt. on medications and Pt. on home beta blockers + CAD, + Past MI (2003), + Cardiac Stents and +CHF  Normal cardiovascular exam+ dysrhythmias Atrial Fibrillation      Neuro/Psych negative neurological ROS  negative psych ROS   GI/Hepatic Neg liver ROS, GERD  ,  Endo/Other  negative endocrine ROS  Renal/GU Renal Insufficiency and ARFRenal disease  negative genitourinary   Musculoskeletal  (+) Arthritis ,   Abdominal   Peds  Hematology  (+) anemia , Hgb 8.4   Anesthesia Other Findings  Echo 05/04/19: EF 45-50%, mild LVH, normal valves, mild pulm HTN  Stress test 03/25/19:   There was no ST segment deviation noted during stress.  No T wave inversion was noted during stress.  Nongated study due to atrial fibrillation.  The study is normal.  This is a low risk study.  Reproductive/Obstetrics                             Anesthesia Physical Anesthesia Plan  ASA: III and emergent  Anesthesia Plan: MAC   Post-op Pain Management:    Induction: Intravenous  PONV Risk Score and Plan: 1 and Propofol infusion, TIVA and Treatment may vary due to age or medical condition  Airway Management Planned: Natural Airway, Nasal Cannula and Simple Face Mask  Additional Equipment: None  Intra-op Plan:   Post-operative Plan:   Informed Consent: I have reviewed the patients History and Physical, chart,  labs and discussed the procedure including the risks, benefits and alternatives for the proposed anesthesia with the patient or authorized representative who has indicated his/her understanding and acceptance.       Plan Discussed with:   Anesthesia Plan Comments:         Anesthesia Quick Evaluation

## 2020-04-24 NOTE — Anesthesia Postprocedure Evaluation (Signed)
Anesthesia Post Note  Patient: [name].  Procedure(s) Performed: COLONOSCOPY WITH PROPOFOL (N/A ) ESOPHAGOGASTRODUODENOSCOPY (EGD) WITH PROPOFOL (N/A ) BIOPSY HEMOSTASIS CONTROL POLYPECTOMY     Patient location during evaluation: Endoscopy Anesthesia Type: MAC Level of consciousness: awake and alert Pain management: pain level controlled Vital Signs Assessment: post-procedure vital signs reviewed and stable Respiratory status: spontaneous breathing, nonlabored ventilation and respiratory function stable Cardiovascular status: blood pressure returned to baseline and stable Postop Assessment: no apparent nausea or vomiting Anesthetic complications: no   No complications documented.  Last Vitals:  Vitals:   04/24/20 1020 04/24/20 1055  BP: 118/65 129/74  Pulse: (!) 115 (!) 103  Resp: 17 20  Temp:  36.7 C  SpO2: 94% 94%    Last Pain:  Vitals:   04/24/20 1055  TempSrc: Oral  PainSc: 5                  Lidia Collum

## 2020-04-24 NOTE — Op Note (Signed)
[facility] Patient Name: Andrew Hardy Procedure Date : 04/24/2020 MRN: [id number] Attending MD: Beverley Fiedler , MD Date of Birth: May 21, 1940 CSN: [id number] Age: 80 Admit Type: Inpatient Procedure:                Colonoscopy Indications:              Hematochezia, Heme positive stool, Acute post                            hemorrhagic anemia Providers:                Carie Caddy. Rhea Belton, MD, Charlett Lango, RN, Beryle Beams, Technician Referring MD:             [facility] Medicines:                Monitored Anesthesia Care Complications:            No immediate complications. Estimated Blood Loss:     Estimated blood loss: none. Procedure:                Pre-Anesthesia Assessment:                           - Prior to the procedure, a History and Physical                            was performed, and patient medications and                            allergies were reviewed. The patient's tolerance of                            previous anesthesia was also reviewed. The risks                            and benefits of the procedure and the sedation                            options and risks were discussed with the patient.                            All questions were answered, and informed consent                            was obtained. Prior Anticoagulants: The patient has                            taken Xarelto (rivaroxaban) and Plavix, last dose                            was 5 days prior to procedure. ASA Grade  Assessment: III - A patient with severe systemic                            disease. After reviewing the risks and benefits,                            the patient was deemed in satisfactory condition to                            undergo the procedure.                           After obtaining informed consent, the colonoscope                            was passed under direct vision. Throughout  the                            procedure, the patient's blood pressure, pulse, and                            oxygen saturations were monitored continuously. The                            [id number]) Olympus colonoscope was                            introduced through the anus and advanced to the                            cecum, identified by appendiceal orifice and                            ileocecal valve. The colonoscopy was performed                            without difficulty. The patient tolerated the                            procedure well. The quality of the bowel                            preparation was good. The ileocecal valve,                            appendiceal orifice, and rectum were photographed. Scope In: 9:19:04 AM Scope Out: 9:51:40 AM Total Procedure Duration: 0 hours 32 minutes 36 seconds  Findings:      Hemorrhoids were found on perianal exam.      A single small angioectasia with active bleeding was found at the       ileocecal valve. There was fresh blood and clots in the cecum/right       colon. Fulguration to stop the bleeding by argon plasma at 0.5       liters/minute and 20 watts (ERBE Right Colon  setting) was successful.      Two sessile polyps were found in the ascending colon. The polyps were 5       to 6 mm in size. These polyps were removed with a cold snare. Resection       and retrieval were complete.      Multiple small and large-mouthed diverticula were found in the sigmoid       colon and descending colon. No bleeding was seen from the diverticula.      Internal hemorrhoids were found during retroflexion. The hemorrhoids       were medium-sized. Impression:               - Hemorrhoids found on perianal exam (prolapsed                            internal).                           - A single bleeding colonic angioectasia. Treated                            with argon plasma coagulation (APC).                           - Two 5  to 6 mm polyps in the ascending colon,                            removed with a cold snare. Resected and retrieved.                           - Diverticulosis in the sigmoid colon and in the                            descending colon.                           - Internal hemorrhoids. Moderate Sedation:      N/A Recommendation:           - Return patient to hospital ward for ongoing care.                           - Resume previous diet.                           - Continue present medications.                           - Resume Xarelto (rivaroxaban) at prior dose in 2                            days. Refer to managing physician for further                            adjustment of therapy.                           - Await  pathology results.                           - No recommendation at this time regarding repeat                            colonoscopy.                           - GI will sign off, but remain available. Please                            call if evidence of rebleeding.                           - Follow-up with Dr. Odie Sera (GI in [place])                            is recommended in 4-6 weeks (or  if patient                            preference). Procedure Code(s):        --- Professional ---                           [phone], 59, Colonoscopy, flexible; with control of                            bleeding, any method                           45385, Colonoscopy, flexible; with removal of                            tumor(s), polyp(s), or other lesion(s) by snare                            technique Diagnosis Code(s):        --- Professional ---                           K64.8, Other hemorrhoids                           K55.21, Angiodysplasia of colon with hemorrhage                           K63.5, Polyp of colon                           K92.1, Melena (includes Hematochezia)                           R19.5, Other fecal abnormalities                            D62, Acute posthemorrhagic anemia  K57.30, Diverticulosis of large intestine without                            perforation or abscess without bleeding CPT copyright 2019 American Medical Association. All rights reserved. The codes documented in this report are preliminary and upon coder review may  be revised to meet current compliance requirements. Andrew Bears, MD 04/24/2020 10:23:01 AM This report has been signed electronically. Number of Addenda: 0

## 2020-04-24 NOTE — Transfer of Care (Signed)
Immediate Anesthesia Transfer of Care Note  Patient: Spence Soberano.  Procedure(s) Performed: COLONOSCOPY WITH PROPOFOL (N/A ) ESOPHAGOGASTRODUODENOSCOPY (EGD) WITH PROPOFOL (N/A ) BIOPSY HEMOSTASIS CONTROL POLYPECTOMY  Patient Location: Endoscopy Unit  Anesthesia Type:MAC  Level of Consciousness: awake, alert  and oriented  Airway & Oxygen Therapy: Patient Spontanous Breathing and Patient connected to nasal cannula oxygen  Post-op Assessment: Report given to RN and Post -op Vital signs reviewed and stable  Post vital signs: Reviewed and stable  Last Vitals:  Vitals Value Taken Time  BP 132/68   Temp    Pulse 117   Resp 14   SpO2 98     Last Pain:  Vitals:   04/24/20 0748  TempSrc: Temporal  PainSc: 0-No pain      Patients Stated Pain Goal: 0 (35/00/93 8182)  Complications: No complications documented.

## 2020-04-24 NOTE — Discharge Summary (Signed)
Physician Discharge Summary  Andrew Hardy. [id number] DOB: 1941/02/09 DOA: 04/18/2020  PCP: Cyndi Bender, PA-C  Admit date: 04/18/2020 Discharge date: 04/24/2020  Admitted From: home Discharge disposition: home  Recommendations for Outpatient Follow-Up:   1. plavix d/c'd by cardiology 2. Cbc/BMP 1 week 3. xarelto resumed Wednesday 1/5   Discharge Diagnosis:   Principal Problem:   GI bleed Active Problems:   Unspecified atrial fibrillation (HCC)   Coronary artery disease involving native coronary artery of native heart with angina pectoris (HCC)   Symptomatic anemia   Renal insufficiency   Chronic systolic CHF (congestive heart failure) (HCC)   Heme positive stool   Acute blood loss anemia   Barrett's esophagus with dysplasia   Hematochezia   Benign neoplasm of ascending colon   Angiodysplasia of colon with hemorrhage    Discharge Condition: Improved.  Diet recommendation: Low sodium, heart healthy.  Wound care: None.  Code status: Full.   History of Present Illness:   Andrew Nunn. is a 80 y.o. male with medical history significant for coronary artery disease, chronic systolic CHF, atrial fibrillation on Xarelto, now presenting to the emergency department for evaluation of shortness of breath, chest pressure, and lightheadedness.  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  He has also developed some chest pressure with exertion recently and has been lightheaded when he stands up.  He denies any leg swelling, orthopnea, fevers, chills, or significant cough.  He denies any abdominal pain, nausea, or vomiting, and has not seen any dark stool, but reports noticing a reddish hue to his stool weeks ago.  [facility] ED Course: Upon  arrival to the ED, patient is found to be afebrile, saturating well on room air, tachycardic to 110, and with stable blood pressure.  EKG features atrial fibrillation with RVR, rate 111, RBBB, and T wave abnormality.  Chest x-rays negative for acute cardiopulmonary disease.  Chemistry panel is notable for creatinine 1.46.  CBC features a hemoglobin of 8.9, down from 14.3 in April.  High-sensitivity troponin is slightly elevated at 20 and then 21.  BNP is 107.  Fecal occult blood testing was positive.  Gastroenterology was consulted by the ED physician and patient was treated with IV Protonix.   Hospital Course by Problem:   Symptomatic anemia; occult GI bleeding -Presents with progressive DOE, angina, and lightheadedness on standing and is found to have Hgb 7 (down from 12.3 in April) and positive FOBT -GI was consulted  -s/p EGD/colonoscopy: - Hemorrhoids found on perianal exam (prolapsed                            internal).                           - A single bleeding colonic angioectasia. Treated                            with argon plasma coagulation (APC).                           - Two 5 to 6 mm  polyps in the ascending colon,                            removed with a cold snare. Resected and retrieved.                           - Diverticulosis in the sigmoid colon and in the                            descending colon.                           - Internal hemorrhoids.   - Esophageal mucosal changes suggestive of                            short-segment Barrett's esophagus. Biopsied.                           - Normal stomach.                           - Normal examined duodenum. -s/p 1 unit PRBC on 12/30  CAD -Patient reports recent exertional chest pressure, likely secondary to blood-loss anemia  - He had slightly elevated and flat troponins in ED -Continue statin, nitrates, beta-blocker -d/c plavix per cards  Chronic systolic CHF -Appears  compensated -resume home meds  Renal insufficiency -resolved - BMP 1 week  permanent Atrial fibrillation -rates up to 140s but returns to normal -Continue digoxin and metoprolol  -seen by cards  obesity Body mass index is 33.77 kg/m.     Medical Consultants:      Discharge Exam:   Vitals:   04/24/20 1020 04/24/20 1055  BP: 118/65 129/74  Pulse: (!) 115 (!) 103  Resp: 17 20  Temp:  98 F (36.7 C)  SpO2: 94% 94%   Vitals:   04/24/20 1002 04/24/20 1013 04/24/20 1020 04/24/20 1055  BP: 132/68 119/74 118/65 129/74  Pulse: (!) 108 91 (!) 115 (!) 103  Resp: 20 17 17 20   Temp: (!) 97.3 F (36.3 C)   98 F (36.7 C)  TempSrc: Temporal   Oral  SpO2: 100% 99% 94% 94%  Weight:      Height:        General exam: Appears calm and comfortable.   The results of significant diagnostics from this hospitalization (including imaging, microbiology, ancillary and laboratory) are listed below for reference.     Procedures and Diagnostic Studies:   DG Chest Port 1 View  Result Date: 04/18/2020 CLINICAL DATA:  Chest pain and shortness of breath EXAM: PORTABLE CHEST 1 VIEW COMPARISON:  04/13/2020 FINDINGS: Upper normal heart size. The lungs appear clear. Mild atherosclerotic calcification of the aortic arch. Mild thoracic spondylosis. No blunting of the costophrenic angles. 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 IMPRESSION: 1. No active cardiopulmonary disease is radiographically apparent. Electronically Signed   By: 04/15/2020 M.D.   On: 04/18/2020 17:01     Labs:   Basic Metabolic Panel: Recent Labs  Lab 04/20/20 0026 04/21/20 0740 04/22/20 0109 04/23/20 0106 04/24/20 0626  NA 138 138 140 141 138  K 3.8 4.0 4.3 4.3 3.9  CL 104 104 106 109  108  CO2 27 23 25 24  21*  GLUCOSE 119* 144* 125* 110* 112*  BUN 18 17 18 16 11   CREATININE 1.34* 1.12 1.18 1.19 1.08  CALCIUM 8.8* 8.7* 8.6* 8.7* 8.5*   GFR Estimated  Creatinine Clearance: 63.2 mL/min (by C-G formula based on SCr of 1.08 mg/dL). Liver Function Tests: Recent Labs  Lab 04/24/20 0626  AST 21  ALT 15  ALKPHOS 45  BILITOT 1.1  PROT 6.3*  ALBUMIN 3.5   No results for input(s): LIPASE, AMYLASE in the last 168 hours. No results for input(s): AMMONIA in the last 168 hours. Coagulation profile No results for input(s): INR, PROTIME in the last 168 hours.  CBC: Recent Labs  Lab 04/20/20 0026 04/20/20 0920 04/21/20 0740 04/22/20 0109 04/23/20 0106 04/24/20 0626  WBC 6.3  --  6.4 6.3 7.0 6.7  NEUTROABS  --   --   --   --   --  4.5  HGB 6.9* 8.6* 9.0* 8.1* 8.1* 8.4*  HCT 22.3* 27.2* 27.4* 24.2* 25.2* 27.4*  MCV 89.2  --  87.5 88.0 88.7 89.5  PLT 218  --  249 228 240 248   Cardiac Enzymes: No results for input(s): CKTOTAL, CKMB, CKMBINDEX, TROPONINI in the last 168 hours. BNP: Invalid input(s): POCBNP CBG: No results for input(s): GLUCAP in the last 168 hours. D-Dimer No results for input(s): DDIMER in the last 72 hours. Hgb A1c No results for input(s): HGBA1C in the last 72 hours. Lipid Profile No results for input(s): CHOL, HDL, LDLCALC, TRIG, CHOLHDL, LDLDIRECT in the last 72 hours. Thyroid function studies No results for input(s): TSH, T4TOTAL, T3FREE, THYROIDAB in the last 72 hours.  Invalid input(s): FREET3 Anemia work up No results for input(s): VITAMINB12, FOLATE, FERRITIN, TIBC, IRON, RETICCTPCT in the last 72 hours. Microbiology Recent Results (from the past 240 hour(s))  MRSA PCR Screening     Status: None   Collection Time: 04/18/20 12:41 AM   Specimen: Nasopharyngeal  Result Value Ref Range Status   MRSA by PCR NEGATIVE NEGATIVE Final    Comment:        The GeneXpert MRSA Assay (FDA approved for NASAL specimens only), is one component of a comprehensive MRSA colonization surveillance program. It is not intended to diagnose MRSA infection nor to guide or monitor treatment for MRSA  infections. Performed at [facility], [place] [address] [place], [place] [address]   Resp Panel by RT-PCR (Flu A&B, Covid) Nasopharyngeal Swab     Status: None   Collection Time: 04/18/20  4:20 PM   Specimen: Nasopharyngeal Swab; Nasopharyngeal(NP) swabs in vial transport medium  Result Value Ref Range Status   SARS Coronavirus 2 by RT PCR NEGATIVE NEGATIVE Final    Comment: (NOTE) SARS-CoV-2 target nucleic acids are NOT DETECTED.  The SARS-CoV-2 RNA is generally detectable in upper respiratory specimens during the acute phase of infection. The lowest concentration of SARS-CoV-2 viral copies this assay can detect is 138 copies/mL. A negative result does not preclude SARS-Cov-2 infection and should not be used as the sole basis for treatment or other patient management decisions. A negative result may occur with  improper specimen collection/handling, submission of specimen other than nasopharyngeal swab, presence of viral mutation(s) within the areas targeted by this assay, and inadequate number of viral copies(<138 copies/mL). A negative result must be combined with clinical observations, patient history, and epidemiological information. The expected result is Negative.  Fact Sheet for Patients:  EntrepreneurPulse.com.au  Fact Sheet for Healthcare  Providers:  IncredibleEmployment.be  This test is no t yet approved or cleared by the [place] and  has been authorized for detection and/or diagnosis of SARS-CoV-2 by FDA under an Emergency Use Authorization (EUA). This EUA will remain  in effect (meaning this test can be used) for the duration of the COVID-19 declaration under Section 564(b)(1) of the Act, 21 U.S.C.section 360bbb-3(b)(1), unless the authorization is terminated  or revoked sooner.       Influenza A by PCR NEGATIVE NEGATIVE Final   Influenza B by PCR NEGATIVE NEGATIVE Final    Comment: (NOTE) The Xpert Xpress  SARS-CoV-2/FLU/RSV plus assay is intended as an aid in the diagnosis of influenza from Nasopharyngeal swab specimens and should not be used as a sole basis for treatment. Nasal washings and aspirates are unacceptable for Xpert Xpress SARS-CoV-2/FLU/RSV testing.  Fact Sheet for Patients: EntrepreneurPulse.com.au  Fact Sheet for Healthcare Providers: IncredibleEmployment.be  This test is not yet approved or cleared by the [place] FDA and has been authorized for detection and/or diagnosis of SARS-CoV-2 by FDA under an Emergency Use Authorization (EUA). This EUA will remain in effect (meaning this test can be used) for the duration of the COVID-19 declaration under Section 564(b)(1) of the Act, 21 U.S.C. section 360bbb-3(b)(1), unless the authorization is terminated or revoked.  Performed at [facility], [address] [place], [place] [address]      Discharge Instructions:   Discharge Instructions    (HEART FAILURE PATIENTS) Call MD:  Anytime you have any of the following symptoms: 1) 3 pound weight gain in 24 hours or 5 pounds in 1 week 2) shortness of breath, with or without a dry hacking cough 3) swelling in the hands, feet or stomach 4) if you have to sleep on extra pillows at night in order to breathe.   Complete by: As directed    Diet - low sodium heart healthy   Complete by: As directed    Discharge instructions   Complete by: As directed    Cbc 1 week D/c plavix Restart xarelto on wednesday   Increase activity slowly   Complete by: As directed      Allergies as of 04/24/2020      Reactions   Sotalol Hcl    Feet burning   Buprenorphine Hcl Itching   Codeine Itching   Hydrocodone Itching   Percocet [oxycodone-acetaminophen] Itching   Pt.states he can take but must take with benadryl      Medication List    STOP taking these medications   clopidogrel 75 MG tablet Commonly known as: PLAVIX     TAKE these  medications   B-12 PO Take 1 tablet by mouth daily.   Co-Enzyme Q-10 30 MG Caps Take 30 mg by mouth daily.   digoxin 0.25 MG tablet Commonly known as: LANOXIN Take 1 tablet (0.25 mg total) by mouth daily.   esomeprazole 40 MG capsule Commonly known as: NEXIUM TAKE 1 CAPSULE BY MOUTH DAILY BEFORE BREAKFAST What changed: See the new instructions.   ezetimibe 10 MG tablet Commonly known as: ZETIA Take 1 tablet (10 mg total) by mouth every evening.   finasteride 5 MG tablet Commonly known as: PROSCAR Take 5 mg by mouth daily.   FISH OIL PO Take 1 tablet by mouth daily.   Flaxseed Oil Max Str 1300 MG Caps Take 1,300 mg by mouth daily.   furosemide 80 MG tablet Commonly known as: LASIX Take 1 tablet (80 mg  total) by mouth in the morning. And take 40 mg in the evening. What changed:   when to take this  additional instructions   gemfibrozil 600 MG tablet Commonly known as: LOPID TAKE 1/2 TABLET BY MOUTH TWICE DAILY BEFORE A MEAL What changed: See the new instructions.   GLUCOSAMINE PO Take 1 tablet by mouth daily.   isosorbide mononitrate 60 MG 24 hr tablet Commonly known as: IMDUR TAKE 1 TABLET(60 MG) BY MOUTH DAILY What changed: See the new instructions.   metoprolol succinate 25 MG 24 hr tablet Commonly known as: TOPROL-XL TAKE 1 TABLET(25 MG) BY MOUTH TWICE DAILY What changed: See the new instructions.   minocycline 50 MG capsule Commonly known as: MINOCIN Take 50 mg by mouth daily.   multivitamins ther. w/minerals Tabs tablet Take 1 tablet by mouth daily.   nitroGLYCERIN 0.4 MG SL tablet Commonly known as: Nitrostat Place 1 tablet (0.4 mg total) under the tongue every 5 (five) minutes as needed for chest pain. TAKE 1 TABLET UNDER THE TONGUE AS NEEDED AS DIRECTED FOR CHEST PAIN. What changed: additional instructions   potassium chloride SA 20 MEQ tablet Commonly known as: KLOR-CON Take 2 tablets (40 mEq total) by mouth in the morning. And take 20  mEq in the evening. What changed:   when to take this  additional instructions   rivaroxaban 20 MG Tabs tablet Commonly known as: Xarelto Take 1 tablet (20 mg total) by mouth daily with supper. Start taking on: April 26, 2020 What changed:   See the new instructions.  These instructions start on April 26, 2020. If you are unsure what to do until then, ask your doctor or other care provider.   simvastatin 40 MG tablet Commonly known as: ZOCOR TAKE 1 TABLET(40 MG) BY MOUTH AT BEDTIME What changed: See the new instructions.   tamsulosin 0.4 MG Caps capsule Commonly known as: FLOMAX Take 0.4 mg by mouth daily.   traZODone 50 MG tablet Commonly known as: DESYREL Take 50 mg by mouth at bedtime.   vitamin E 180 MG (400 UNITS) capsule Take 400 Units by mouth daily.       Follow-up Information    Cyndi Bender, PA-C Follow up in 1 week(s).   Specialty: Physician Assistant Contact information: [place] [address] [phone]        Constance Haw, MD .   Specialty: Cardiology Contact information: [place] [address] [phone]        Richardo Priest, MD .   Specialty: Cardiology Contact information: [place] [address] [phone]        Jackquline Denmark, MD. Call.   Specialties: Gastroenterology, Internal Medicine Contact information: [name] [address] [phone]                Time coordinating discharge: 35 min  Signed:  Geradine Girt DO  [facility] 04/24/2020, 11:45 AM

## 2020-04-24 NOTE — Interval H&P Note (Signed)
History and Physical Interval Note: For EGD/colon today off Plavix and Xarelto to eval GI bleeding HIGHER THAN BASELINE RISK.The nature of the procedure, as well as the risks, benefits, and alternatives were carefully and thoroughly reviewed with the patient. Ample time for discussion and questions allowed. The patient understood, was satisfied, and agreed to proceed.   CBC Latest Ref Rng & Units 04/24/2020 04/23/2020 04/22/2020  WBC 4.0 - 10.5 K/uL 6.7 7.0 6.3  Hemoglobin 13.0 - 17.0 g/dL 3.8(H) 8.1(L) 8.1(L)  Hematocrit 39.0 - 52.0 % 27.4(L) 25.2(L) 24.2(L)  Platelets 150 - 400 K/uL 248 240 228    04/24/2020 8:38 AM  Jacqualyn Posey.  has presented today for surgery, with the diagnosis of GL bleed.  The various methods of treatment have been discussed with the patient and family. After consideration of risks, benefits and other options for treatment, the patient has consented to  Procedure(s): COLONOSCOPY WITH PROPOFOL (N/A) ESOPHAGOGASTRODUODENOSCOPY (EGD) WITH PROPOFOL (N/A) as a surgical intervention.  The patient's history has been reviewed, patient examined, no change in status, stable for surgery.  I have reviewed the patient's chart and labs.  Questions were answered to the patient's satisfaction.     Carie Caddy Koree Schopf

## 2020-04-26 ENCOUNTER — Encounter: Payer: Self-pay | Admitting: Internal Medicine

## 2020-04-26 LAB — SURGICAL PATHOLOGY

## 2020-04-27 DIAGNOSIS — I4891 Unspecified atrial fibrillation: Secondary | ICD-10-CM | POA: Diagnosis not present

## 2020-04-27 DIAGNOSIS — Z6834 Body mass index (BMI) 34.0-34.9, adult: Secondary | ICD-10-CM | POA: Diagnosis not present

## 2020-04-27 DIAGNOSIS — D500 Iron deficiency anemia secondary to blood loss (chronic): Secondary | ICD-10-CM | POA: Diagnosis not present

## 2020-04-27 DIAGNOSIS — Z79899 Other long term (current) drug therapy: Secondary | ICD-10-CM | POA: Diagnosis not present

## 2020-04-27 DIAGNOSIS — K22719 Barrett's esophagus with dysplasia, unspecified: Secondary | ICD-10-CM | POA: Diagnosis not present

## 2020-04-27 DIAGNOSIS — N289 Disorder of kidney and ureter, unspecified: Secondary | ICD-10-CM | POA: Diagnosis not present

## 2020-04-27 DIAGNOSIS — I25118 Atherosclerotic heart disease of native coronary artery with other forms of angina pectoris: Secondary | ICD-10-CM | POA: Diagnosis not present

## 2020-04-27 DIAGNOSIS — K922 Gastrointestinal hemorrhage, unspecified: Secondary | ICD-10-CM | POA: Diagnosis not present

## 2020-04-27 NOTE — Progress Notes (Signed)
Cardiology Office Note:    Date:  04/28/2020   ID:  Andrew Hardy., DOB [birth year], MRN [id number]  PCP:  Cyndi Bender, PA-C  Cardiologist:  Shirlee More, MD    Referring MD: Cyndi Bender, PA-C    ASSESSMENT:    1. Persistent atrial fibrillation ([place])   2. High risk medication use   3. Chronic anticoagulation   4. Coronary artery disease involving native coronary artery of native heart with angina pectoris ([place])   5. Hypertensive heart disease with heart failure ([place])   6. Iron deficiency anemia due to chronic blood loss    PLAN:    In order of problems listed above:  1. He remains quite symptomatic and impaired with atrial fibrillation I think he is best served by referral to EP consider either dofetilide therapy or EP cardioversion and is back on his anticoagulant since hospital discharge no recurrent bleeding no longer on clopidogrel and I will go ahead and check labs including his CBC today continue oral iron. 2. Stable CAD continue medical therapy 3. Heart failure is compensated continue his current diuretic 4. Continue iron recheck CBC today   Next appointment: 6 weeks with me   Medication Adjustments/Labs and Tests Ordered: Current medicines are reviewed at length with the patient today.  Concerns regarding medicines are outlined above.  Orders Placed This Encounter  Procedures  . CBC  . Basic metabolic panel  . Digoxin level  . Ambulatory referral to Cardiac Electrophysiology   No orders of the defined types were placed in this encounter.   Chief Complaint  Patient presents with  . Follow-up    Recently hospitalized with GI bleed transfused seen by cardiology no changes in medical treatment.  He was restarted on anticoagulation without clopidogrel at discharge    History of Present Illness:    Andrew Hardy. is a 80 y.o. male with a hx of CAD with drug-eluting stents to LAD in 2018 and balloon angioplasty of first diagonal branch, atrial fibrillation  and flutter hypertensive heart disease with heart failure and dyslipidemia. Recently was seen in the office 03/20/2020 with recurrent atrial fibrillation rapid rate and digoxin was added for rate control. He was last seen 04/21/2020 by Dr. Angelena Form prior to hospital discharge. At that time atrial fibrillation is rate controlled and a recommendation was to resume Xarelto when safe from a GI perspective. Compliance with diet, lifestyle and medications: Yes  Reviewed the hospital records for today's visit  He recently was admitted to [facility] 04/18/2020 with symptomatic anemia stool fecal occult blood positive seen by GI clopidogrel was withdrawn treated with IV Protonix and underwent endoscopic evaluation showing a single bleeding colonic angiectasia with coagulation and transfused 1 unit of packed cells. He was in atrial fibrillation throughout seen by cardiology and continued on digoxin and beta-blocker.  Recent labs included BMP creatinine 1.46 potassium 4.1 hemoglobin prior to discharge 8.9 proBNP level was low at 100 and 7G 04/19/2020 showed atrial fibrillation rate 110 bpm with right bundle branch block.  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  He failed to maintain sinus rhythm with cardioversion and previously was intolerant of amiodarone with a high and neurologic toxicity.  He was seen by Dr. Olin Hauser, EP, [facility] regional cardiology 12/27/2015 that note is available in Care Everywhere Past Medical History:  Diagnosis Date  . Anxiety   . Arthritis   . Atypical atrial  flutter ([place]) 12/14/2014  . Bradycardia, sinus 10/15/2014  . Burning sensation of feet 01/17/2016  . CAD, multiple vessel 10/18/2014   Overview:  DES to [facility] for unstable angina 10/17/14 Diagnostic Summary Severe stenosis of mid Cx likely cause of recent accelerating angina Diffuse severe stenosis of small caliber LAD unchanged  from 2012 Normal LV function Paroxysmal SVT noted during procedure 04/23/16:  PCI / Resolute Drug Eluting Stent of the proximal Left Anterior Descending Coronary Artery. Successful PTCA of the ostial 1st Diagonal Coronary Artery  . 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 Coronary artery disease 2003   s/p MI and numerous stents  . Coronary artery disease involving native coronary artery of native heart with angina pectoris ([place]) 10/18/2014   Overview:  DES to [facility] for unstable angina 10/17/14 Diagnostic Summary Severe stenosis of mid Cx likely cause of recent accelerating angina Diffuse severe stenosis of small caliber LAD unchanged from 2012 Normal LV function Paroxysmal SVT noted during procedure 04/23/16:  PCI / Resolute Drug Eluting Stent of the proximal Left Anterior Descending Coronary Artery. Successful PTCA of the ostial 1st Di  . 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 Degenerative spondylolisthesis 05/02/2017  . Depression   . Enlarged prostate without lower urinary tract symptoms (luts) 2012   frequent urination and nocturia; sees dr [name] Late in [place]  . Essential hypertension 10/15/2014  . Gastroesophageal reflux disease without esophagitis 04/21/2016  . GERD (gastroesophageal reflux disease)   . Hyperlipidemia   . Hypertension   . Hypertensive heart disease with heart failure ([place]) 12/20/2016  . IHD (ischemic heart disease) 10/15/2014  . Lower limb pain, inferior, right 12/17/2017  . Lumbar radiculopathy 02/21/2016  . Myocardial infarction ([place]) 2003  . NSTEMI (non-ST elevated myocardial infarction) ([place]) 04/24/2016   Overview:  LHC 04/24/15: Severe stenosis of the LAD & Diagonal bifurcation  LV ejection fraction is 55-60 %  Interventional Summary Successful Angiosculpt PCI / Resolute Drug Eluting Stent of the proximal LAD Successful PTCA of the ostial 1st Diagonal Coronary Artery  . Paroxysmal SVT (supraventricular tachycardia) ([place]) 11/16/2014  .  Precordial pain 10/15/2014  . PVC's (premature ventricular contractions) 04/22/2016  . RBBB 10/15/2014  . S/P lumbar fusion 04/24/2017  . Sacroiliac joint pain 09/10/2018  . Sleep disturbances 01/17/2016  . Snoring 01/17/2016  . Somatic dysfunction of left sacroiliac joint 06/19/2018  . Subscapularis (muscle) sprain 03/29/2011  . 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 BIOPSY  04/24/2020   Procedure: BIOPSY;  Surgeon: Jerene Bears, MD;  Location[facility] ENDOSCOPY;  Service: Gastroenterology;;  . CARDIAC CATHETERIZATION  [phone]   ptca/stent in [place]  . CARDIAC CATHETERIZATION  04/23/2016  . COLONOSCOPY WITH PROPOFOL N/A 04/24/2020   Procedure: COLONOSCOPY WITH PROPOFOL;  Surgeon: Jerene Bears, MD;  Location: [place];  Service: Gastroenterology;  Laterality: N/A;  . CORONARY ANGIOPLASTY    . ESOPHAGOGASTRODUODENOSCOPY (EGD) WITH PROPOFOL N/A 04/24/2020   Procedure: ESOPHAGOGASTRODUODENOSCOPY (EGD) WITH PROPOFOL;  Surgeon: Jerene Bears, MD;  Location: [place];  Service: Gastroenterology;  Laterality: N/A;  . EYE SURGERY  05-1010   bil cataract ,iol  . FRACTURE SURGERY    . HEMOSTASIS CONTROL  04/24/2020   Procedure: HEMOSTASIS CONTROL;  Surgeon: Jerene Bears, MD;  Location: [facility] ENDOSCOPY;  Service: Gastroenterology;;  . I & D EXTREMITY Right 02/23/2015   Procedure: RIGHT RING FINGER REVISION AMPUTATION;  Surgeon: Leanora Cover, MD;  Location: [place];  Service: Orthopedics;  Laterality: Right;  . KNEE ARTHROSCOPY W/ MENISCAL REPAIR  2000   left knee  . LUMBAR EPIDURAL INJECTION  2011   low back pain; DDD  . POLYPECTOMY  04/24/2020   Procedure: POLYPECTOMY;  Surgeon: Jerene Bears, MD;  Location: [facility] ENDOSCOPY;  Service: Gastroenterology;;  . ring finger tramatic qamputation Right   . SHOULDER ARTHROSCOPY  03/29/2011   Procedure: ARTHROSCOPY SHOULDER;  Surgeon: Augustin Schooling;  Location: [place];  Service: Orthopedics;   Laterality: Right;  RIGHT SHOULDER ARTHROSCOPY WITH OPEN SUBSCAPULAR REPAIR  . SHOULDER OPEN ROTATOR CUFF REPAIR     3 on rt shoulder; 2 on left  . TRANSFORAMINAL LUMBAR INTERBODY FUSION (TLIF) WITH PEDICLE SCREW FIXATION 1 LEVEL N/A 04/24/2017   Procedure: TRANSFORAMINAL LUMBAR INTERBODY FUSION (TLIF) L4-5;  Surgeon: Melina Schools, MD;  Location: [place];  Service: Orthopedics;  Laterality: N/A;  4 hrs    Current Medications: Current Meds  Medication Sig  . Co-Enzyme Q-10 30 MG CAPS Take 30 mg by mouth daily.  . Cyanocobalamin (B-12 PO) Take 1 tablet by mouth daily.  . digoxin (LANOXIN) 0.25 MG tablet Take 1 tablet (0.25 mg total) by mouth daily.  Marland Kitchen esomeprazole (NEXIUM) 40 MG capsule TAKE 1 CAPSULE BY MOUTH DAILY BEFORE BREAKFAST (Patient taking differently: Take 40 mg by mouth daily at 12 noon.)  . ezetimibe (ZETIA) 10 MG tablet Take 1 tablet (10 mg total) by mouth every evening.  . finasteride (PROSCAR) 5 MG tablet Take 5 mg by mouth daily.  . Flaxseed, Linseed, (FLAXSEED OIL MAX STR) 1300 MG CAPS Take 1,300 mg by mouth daily.  . furosemide (LASIX) 80 MG tablet Take 1 tablet (80 mg total) by mouth in the morning. And take 40 mg in the evening. (Patient taking differently: Take 80 mg by mouth See admin instructions. 80mg  in the morning 40mg  in the evening)  . gemfibrozil (LOPID) 600 MG tablet TAKE 1/2 TABLET BY MOUTH TWICE DAILY BEFORE A MEAL (Patient taking differently: Take 300 mg by mouth 2 (two) times daily before a meal.)  . Glucosamine HCl (GLUCOSAMINE PO) Take 1 tablet by mouth daily.  . isosorbide mononitrate (IMDUR) 60 MG 24 hr tablet TAKE 1 TABLET(60 MG) BY MOUTH DAILY (Patient taking differently: Take 60 mg by mouth daily.)  . metoprolol succinate (TOPROL-XL) 25 MG 24 hr tablet TAKE 1 TABLET(25 MG) BY MOUTH TWICE DAILY (Patient taking differently: Take 25 mg by mouth 2 (two) times daily.)  . minocycline (MINOCIN,DYNACIN) 50 MG capsule Take 50 mg by mouth daily.  . Multiple  Vitamins-Minerals (MULTIVITAMINS THER. W/MINERALS) TABS Take 1 tablet by mouth daily.  . nitroGLYCERIN (NITROSTAT) 0.4 MG SL tablet Place 1 tablet (0.4 mg total) under the tongue every 5 (five) minutes as needed for chest pain. TAKE 1 TABLET UNDER THE TONGUE AS NEEDED AS DIRECTED FOR CHEST PAIN. (Patient taking differently: Place 0.4 mg under the tongue every 5 (five) minutes as needed for chest pain.)  . Omega-3 Fatty Acids (FISH OIL PO) Take 1 tablet by mouth daily.  . potassium chloride SA (KLOR-CON) 20 MEQ tablet Take 2 tablets (40 mEq total) by mouth in the morning. And take 20 mEq in the evening. (Patient taking differently: Take 40 mEq by mouth See admin instructions. 59meq in the morning 40meq in the evening)  . rivaroxaban (XARELTO) 20 MG TABS tablet Take 1 tablet (20 mg total) by mouth daily with supper.  . simvastatin (ZOCOR) 40 MG tablet TAKE  1 TABLET(40 MG) BY MOUTH AT BEDTIME (Patient taking differently: Take 40 mg by mouth daily at 6 PM.)  . tamsulosin (FLOMAX) 0.4 MG CAPS capsule Take 0.4 mg by mouth daily.  . traZODone (DESYREL) 50 MG tablet Take 50 mg by mouth at bedtime.  . vitamin E 400 UNIT capsule Take 400 Units by mouth daily.     Allergies:   Sotalol hcl, Buprenorphine hcl, Codeine, Hydrocodone, and Percocet [oxycodone-acetaminophen]   Social History   Socioeconomic History  . Marital status: Married    Spouse name: Not on file  . Number of children: Not on file  . Years of education: Not on file  . Highest education level: Not on file  Occupational History  . Not on file  Tobacco Use  . Smoking status: Former Smoker    Packs/day: 1.00    Years: 20.00    Pack years: 20.00    Types: Cigarettes    Quit date: 03/20/1994    Years since quitting: 26.1  . Smokeless tobacco: Former Neurosurgeon    Types: Chew    Quit date: 03/20/1996  Vaping Use  . Vaping Use: Never used  Substance and Sexual Activity  . Alcohol use: Yes    Alcohol/week: 6.0 standard drinks    Types:  6 Cans of beer per week  . Drug use: No  . Sexual activity: Not on file  Other Topics Concern  . Not on file  Social History Narrative  . Not on file   Social Determinants of Health   Financial Resource Strain: Not on file  Food Insecurity: Not on file  Transportation Needs: Not on file  Physical Activity: Not on file  Stress: Not on file  Social Connections: Not on file     Family History: The patient's family history includes Cancer in his father and sister; Heart disease in his father and mother. ROS:   Please see the history of present illness.    All other systems reviewed and are negative.  EKGs/Labs/Other Studies Reviewed:    The following studies were reviewed today:   Recent Labs: 04/13/2020: NT-Pro BNP 521 04/18/2020: B Natriuretic Peptide 107.1 04/24/2020: ALT 15; BUN 11; Creatinine, Ser 1.08; Hemoglobin 8.4; Platelets 248; Potassium 3.9; Sodium 138  Recent Lipid Panel    Component Value Date/Time   CHOL 152 04/08/2018 0921   TRIG 198 (H) 04/08/2018 0921   HDL 48 04/08/2018 0921   CHOLHDL 3.2 04/08/2018 0921   LDLCALC 64 04/08/2018 0921    Physical Exam:    VS:  BP (!) 112/56 (BP Location: Right Arm, Patient Position: Sitting, Cuff Size: Normal)   Pulse 87   Ht 5\' 7"  (1.702 m)   Wt 218 lb 12.8 oz (99.2 kg)   SpO2 98%   BMI 34.27 kg/m     Wt Readings from Last 3 Encounters:  04/28/20 218 lb 12.8 oz (99.2 kg)  04/24/20 211 lb 3.2 oz (95.8 kg)  04/13/20 220 lb (99.8 kg)     GEN:  Well nourished, well developed in no acute distress HEENT: Normal NECK: No JVD; No carotid bruits LYMPHATICS: No lymphadenopathy CARDIAC: S1 variable irregular rhythm  no murmurs, rubs, gallops RESPIRATORY:  Clear to auscultation without rales, wheezing or rhonchi  ABDOMEN: Soft, non-tender, non-distended MUSCULOSKELETAL:  No edema; No deformity  SKIN: Warm and dry NEUROLOGIC:  Alert and oriented x 3 PSYCHIATRIC:  Normal affect    Signed, 04/15/20, MD   04/28/2020 2:21 PM    [facility]

## 2020-04-28 ENCOUNTER — Ambulatory Visit: Payer: Medicare Other | Admitting: Cardiology

## 2020-04-28 ENCOUNTER — Encounter: Payer: Self-pay | Admitting: Cardiology

## 2020-04-28 ENCOUNTER — Ambulatory Visit (INDEPENDENT_AMBULATORY_CARE_PROVIDER_SITE_OTHER): Payer: Medicare Other | Admitting: Cardiology

## 2020-04-28 ENCOUNTER — Other Ambulatory Visit: Payer: Self-pay

## 2020-04-28 VITALS — BP 112/56 | HR 87 | Ht 67.0 in | Wt 218.8 lb

## 2020-04-28 DIAGNOSIS — I110 Hypertensive heart disease with heart failure: Secondary | ICD-10-CM

## 2020-04-28 DIAGNOSIS — I4819 Other persistent atrial fibrillation: Secondary | ICD-10-CM

## 2020-04-28 DIAGNOSIS — Z7901 Long term (current) use of anticoagulants: Secondary | ICD-10-CM

## 2020-04-28 DIAGNOSIS — I25119 Atherosclerotic heart disease of native coronary artery with unspecified angina pectoris: Secondary | ICD-10-CM

## 2020-04-28 DIAGNOSIS — D500 Iron deficiency anemia secondary to blood loss (chronic): Secondary | ICD-10-CM | POA: Diagnosis not present

## 2020-04-28 DIAGNOSIS — Z79899 Other long term (current) drug therapy: Secondary | ICD-10-CM

## 2020-04-28 NOTE — Patient Instructions (Signed)
Medication Instructions:  Your physician recommends that you continue on your current medications as directed. Please refer to the Current Medication list given to you today.  *If you need a refill on your cardiac medications before your next appointment, please call your pharmacy*   Lab Work: Your physician recommends that you return for lab work in: TODAY BMP, CBC, Digoxin If you have labs (blood work) drawn today and your tests are completely normal, you will receive your results only by: Marland Kitchen MyChart Message (if you have MyChart) OR . A paper copy in the mail If you have any lab test that is abnormal or we need to change your treatment, we will call you to review the results.   Testing/Procedures: None   Follow-Up: At [facility], you and your health needs are our priority.  As part of our continuing mission to provide you with exceptional heart care, we have created designated Provider Care Teams.  These Care Teams include your primary Cardiologist (physician) and Advanced Practice Providers (APPs -  Physician Assistants and Nurse Practitioners) who all work together to provide you with the care you need, when you need it.  We recommend signing up for the patient portal called "MyChart".  Sign up information is provided on this After Visit Summary.  MyChart is used to connect with patients for Virtual Visits (Telemedicine).  Patients are able to view lab/test results, encounter notes, upcoming appointments, etc.  Non-urgent messages can be sent to your provider as well.   To learn more about what you can do with MyChart, go to [email or web address].    Your next appointment:   6 week(s)  The format for your next appointment:   In Person  Provider:   Shirlee More, MD   Other Instructions

## 2020-04-29 LAB — BASIC METABOLIC PANEL
BUN/Creatinine Ratio: 16 (ref 10–24)
BUN: 22 mg/dL (ref 8–27)
CO2: 22 mmol/L (ref 20–29)
Calcium: 9.3 mg/dL (ref 8.6–10.2)
Chloride: 102 mmol/L (ref 96–106)
Creatinine, Ser: 1.34 mg/dL — ABNORMAL HIGH (ref 0.76–1.27)
GFR calc Af Amer: 59 mL/min/{1.73_m2} — ABNORMAL LOW (ref >59)
GFR calc non Af Amer: 51 mL/min/{1.73_m2} — ABNORMAL LOW (ref >59)
Glucose: 116 mg/dL — ABNORMAL HIGH (ref 65–99)
Potassium: 4.6 mmol/L (ref 3.5–5.2)
Sodium: 139 mmol/L (ref 134–144)

## 2020-04-29 LAB — CBC
Hematocrit: 28.5 % — ABNORMAL LOW (ref 37.5–51.0)
Hemoglobin: 8.9 g/dL — ABNORMAL LOW (ref 13.0–17.7)
MCH: 26.6 pg (ref 26.6–33.0)
MCHC: 31.2 g/dL — ABNORMAL LOW (ref 31.5–35.7)
MCV: 85 fL (ref 79–97)
NRBC: 1 % — ABNORMAL HIGH (ref 0–0)
Platelets: 263 10*3/uL (ref 150–450)
RBC: 3.34 x10E6/uL — ABNORMAL LOW (ref 4.14–5.80)
RDW: 15.3 % (ref 11.6–15.4)
WBC: 5.8 10*3/uL (ref 3.4–10.8)

## 2020-04-29 LAB — DIGOXIN LEVEL: Digoxin, Serum: 1.1 ng/mL — ABNORMAL HIGH (ref 0.5–0.9)

## 2020-05-01 ENCOUNTER — Telehealth: Payer: Self-pay

## 2020-05-01 NOTE — Telephone Encounter (Signed)
-----   Message from Richardo Priest, MD sent at 04/30/2020 10:36 AM EST ----- These are stable results he had a recent GI bleed with transfusion no change in treatment

## 2020-05-01 NOTE — Telephone Encounter (Signed)
Spoke with patient regarding results and recommendation.  Patient verbalizes understanding and is agreeable to plan of care. Advised patient to call back with any issues or concerns.  

## 2020-05-04 ENCOUNTER — Other Ambulatory Visit: Payer: Self-pay | Admitting: Cardiology

## 2020-05-05 DIAGNOSIS — D500 Iron deficiency anemia secondary to blood loss (chronic): Secondary | ICD-10-CM | POA: Diagnosis not present

## 2020-05-05 DIAGNOSIS — I4891 Unspecified atrial fibrillation: Secondary | ICD-10-CM | POA: Diagnosis not present

## 2020-05-05 DIAGNOSIS — Z6834 Body mass index (BMI) 34.0-34.9, adult: Secondary | ICD-10-CM | POA: Diagnosis not present

## 2020-05-05 DIAGNOSIS — I25118 Atherosclerotic heart disease of native coronary artery with other forms of angina pectoris: Secondary | ICD-10-CM | POA: Diagnosis not present

## 2020-05-08 ENCOUNTER — Telehealth: Payer: Medicare Other | Admitting: Cardiology

## 2020-05-15 ENCOUNTER — Encounter: Payer: Self-pay | Admitting: Gastroenterology

## 2020-05-15 ENCOUNTER — Other Ambulatory Visit: Payer: Self-pay

## 2020-05-15 ENCOUNTER — Other Ambulatory Visit (INDEPENDENT_AMBULATORY_CARE_PROVIDER_SITE_OTHER): Payer: Medicare Other

## 2020-05-15 ENCOUNTER — Ambulatory Visit (INDEPENDENT_AMBULATORY_CARE_PROVIDER_SITE_OTHER): Payer: Medicare Other | Admitting: Gastroenterology

## 2020-05-15 VITALS — BP 118/66 | HR 91 | Ht 67.0 in | Wt 222.0 lb

## 2020-05-15 DIAGNOSIS — K922 Gastrointestinal hemorrhage, unspecified: Secondary | ICD-10-CM | POA: Diagnosis not present

## 2020-05-15 DIAGNOSIS — K2270 Barrett's esophagus without dysplasia: Secondary | ICD-10-CM

## 2020-05-15 LAB — CBC WITH DIFFERENTIAL/PLATELET
Basophils Absolute: 0.1 10*3/uL (ref 0.0–0.1)
Basophils Relative: 1 % (ref 0.0–3.0)
Eosinophils Absolute: 0.1 10*3/uL (ref 0.0–0.7)
Eosinophils Relative: 1.3 % (ref 0.0–5.0)
HCT: 35.8 % — ABNORMAL LOW (ref 39.0–52.0)
Hemoglobin: 11.2 g/dL — ABNORMAL LOW (ref 13.0–17.0)
Lymphocytes Relative: 18 % (ref 12.0–46.0)
Lymphs Abs: 1 10*3/uL (ref 0.7–4.0)
MCHC: 31.3 g/dL (ref 30.0–36.0)
MCV: 88.2 fl (ref 78.0–100.0)
Monocytes Absolute: 0.7 10*3/uL (ref 0.1–1.0)
Monocytes Relative: 12.4 % — ABNORMAL HIGH (ref 3.0–12.0)
Neutro Abs: 3.8 10*3/uL (ref 1.4–7.7)
Neutrophils Relative %: 67.3 % (ref 43.0–77.0)
Platelets: 285 10*3/uL (ref 150.0–400.0)
RBC: 4.05 Mil/uL — ABNORMAL LOW (ref 4.22–5.81)
RDW: 20.7 % — ABNORMAL HIGH (ref 11.5–15.5)
WBC: 5.6 10*3/uL (ref 4.0–10.5)

## 2020-05-15 LAB — COMPREHENSIVE METABOLIC PANEL
ALT: 9 U/L (ref 0–53)
AST: 13 U/L (ref 0–37)
Albumin: 4.5 g/dL (ref 3.5–5.2)
Alkaline Phosphatase: 43 U/L (ref 39–117)
BUN: 17 mg/dL (ref 6–23)
CO2: 30 mEq/L (ref 19–32)
Calcium: 10 mg/dL (ref 8.4–10.5)
Chloride: 102 mEq/L (ref 96–112)
Creatinine, Ser: 1.2 mg/dL (ref 0.40–1.50)
GFR: 58.15 mL/min — ABNORMAL LOW (ref >60.00)
Glucose, Bld: 140 mg/dL — ABNORMAL HIGH (ref 70–99)
Potassium: 4.1 mEq/L (ref 3.5–5.1)
Sodium: 140 mEq/L (ref 135–145)
Total Bilirubin: 0.5 mg/dL (ref 0.2–1.2)
Total Protein: 7 g/dL (ref 6.0–8.3)

## 2020-05-15 NOTE — Patient Instructions (Addendum)
If you are age 78 or older, your body mass index should be between 23-30. Your Body mass index is 34.77 kg/m. If this is out of the aforementioned range listed, please consider follow up with your Primary Care Provider.  If you are age 19 or younger, your body mass index should be between 19-25. Your Body mass index is 34.77 kg/m. If this is out of the aformentioned range listed, please consider follow up with your Primary Care Provider.   Please go to 2nd floor [address] to [place] to have lab work done. We will call with results

## 2020-05-15 NOTE — Progress Notes (Signed)
Chief Complaint:   Referring Provider:  Lonie Peak, PA-C      ASSESSMENT AND PLAN;   #1. GERD with short segment Barrett's   #2. GI bleed (resolved). 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  A. fib on Xarelto, CAD s/p multiple stents, HTN, HLD.  Being followed by Dr. Dulce Sellar.  Plan: -CBC, CMP. -Continue Nexium 40mg  po qd. -Continue Xeralto to continue. -If still with problems, CT AP followed by [facility] HPI:    Andrew Hardy. is a 80 y.o. male  For follow-up visit  Adm to [facility] 12/28-04/24/2020 with symptomatic anemia with FOBT+, Hb 14.3 (07/2019) to 8.9 to 7.5 s/p 1U to 8.9.  Had negative EGD except for short segment Barrett's esophagus.  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  Plavix was discontinued during admission and Xarelto continued.  Doing very well from GI standpoint.  No further fatigue.  No nausea, vomiting, heartburn, regurgitation, odynophagia or dysphagia.  No significant diarrhea or constipation. No melena or hematochezia. No unintentional weight loss. No abdominal pain.   Past GI work-up: Colonoscopy 04/24/2020 - Hemorrhoids found on perianal exam (prolapsed internal). - A single bleeding colonic angioectasia. Treated with argon plasma coagulation (APC). - Two 5 to 6 mm polyps in the ascending colon, removed with a cold snare. Resected and retrieved. - Diverticulosis in the sigmoid colon and in the descending colon. - Internal hemorrhoids.  EGD 04/24/2020 - Esophageal mucosal changes suggestive of short-segment Barrett's esophagus. Biopsied.  Past Medical History:  Diagnosis Date  . Anxiety   . Arthritis   . Atypical atrial flutter (HCC) 12/14/2014  . Bradycardia, sinus 10/15/2014  . Burning sensation of feet 01/17/2016  . CAD, multiple vessel 10/18/2014   Overview:  DES to [facility] for unstable angina 10/17/14 Diagnostic Summary Severe stenosis of mid Cx likely cause of recent accelerating angina  Diffuse severe stenosis of small caliber LAD unchanged from 2012 Normal LV function Paroxysmal SVT noted during procedure 04/23/16:  PCI / Resolute Drug Eluting Stent of the proximal Left Anterior Descending Coronary Artery. Successful PTCA of the ostial 1st Diagonal Coronary Artery  . 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 Coronary artery disease 2003   s/p MI and numerous stents  . Coronary artery disease involving native coronary artery of native heart with angina pectoris (HCC) 10/18/2014   Overview:  DES to [facility] for unstable angina 10/17/14 Diagnostic Summary Severe stenosis of mid Cx likely cause of recent accelerating angina Diffuse severe stenosis of small caliber LAD unchanged from 2012 Normal LV function Paroxysmal SVT noted during procedure 04/23/16:  PCI / Resolute Drug Eluting Stent of the proximal Left Anterior Descending Coronary Artery. Successful PTCA of the ostial 1st Di  . 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 Degenerative spondylolisthesis 05/02/2017  . Depression   . Enlarged prostate without lower urinary tract symptoms (luts) 2012   frequent urination and nocturia; sees dr Oda Cogan in [place]  . Essential hypertension 10/15/2014  . Gastroesophageal reflux disease without esophagitis 04/21/2016  . GERD (gastroesophageal reflux disease)   . Hyperlipidemia   . Hypertension   . 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 IHD (ischemic heart disease) 10/15/2014  . Lower limb pain, inferior, right 12/17/2017  . Lumbar radiculopathy 02/21/2016  . Myocardial infarction (HCC) 2003  . NSTEMI (non-ST elevated myocardial infarction) (HCC) 04/24/2016   Overview:  LHC 04/24/15: Severe stenosis of  the LAD & Diagonal bifurcation  LV ejection fraction is 55-60 %  Interventional Summary Successful Angiosculpt PCI / Resolute Drug Eluting Stent of the proximal LAD Successful PTCA of the ostial 1st Diagonal Coronary Artery  . Paroxysmal SVT  (supraventricular tachycardia) ([place]) 11/16/2014  . Precordial pain 10/15/2014  . PVC's (premature ventricular contractions) 04/22/2016  . RBBB 10/15/2014  . S/P lumbar fusion 04/24/2017  . Sacroiliac joint pain 09/10/2018  . Sleep disturbances 01/17/2016  . Snoring 01/17/2016  . Somatic dysfunction of left sacroiliac joint 06/19/2018  . Subscapularis (muscle) sprain 03/29/2011  . 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 BIOPSY  04/24/2020   Procedure: BIOPSY;  Surgeon: Jerene Bears, MD;  Location: [facility] ENDOSCOPY;  Service: Gastroenterology;;  . CARDIAC CATHETERIZATION  [phone]   ptca/stent in [place]  . CARDIAC CATHETERIZATION  04/23/2016  . COLONOSCOPY WITH PROPOFOL N/A 04/24/2020   Procedure: COLONOSCOPY WITH PROPOFOL;  Surgeon: Jerene Bears, MD;  Location: [place];  Service: Gastroenterology;  Laterality: N/A;  . CORONARY ANGIOPLASTY    . ESOPHAGOGASTRODUODENOSCOPY (EGD) WITH PROPOFOL N/A 04/24/2020   Procedure: ESOPHAGOGASTRODUODENOSCOPY (EGD) WITH PROPOFOL;  Surgeon: Jerene Bears, MD;  Location: [place];  Service: Gastroenterology;  Laterality: N/A;  . EYE SURGERY  05-1010   bil cataract ,iol  . FRACTURE SURGERY    . HEMOSTASIS CONTROL  04/24/2020   Procedure: HEMOSTASIS CONTROL;  Surgeon: Jerene Bears, MD;  Location: [facility] ENDOSCOPY;  Service: Gastroenterology;;  . I & D EXTREMITY Right 02/23/2015   Procedure: RIGHT RING FINGER REVISION AMPUTATION;  Surgeon: Leanora Cover, MD;  Location: [place];  Service: Orthopedics;  Laterality: Right;  . KNEE ARTHROSCOPY W/ MENISCAL REPAIR  2000   left knee  . LUMBAR EPIDURAL INJECTION  2011   low back pain; DDD  . POLYPECTOMY  04/24/2020   Procedure: POLYPECTOMY;  Surgeon: Jerene Bears, MD;  Location: [facility] ENDOSCOPY;  Service: Gastroenterology;;  . ring finger tramatic qamputation Right   . SHOULDER ARTHROSCOPY  03/29/2011   Procedure: ARTHROSCOPY SHOULDER;  Surgeon: Augustin Schooling;   Location: [place];  Service: Orthopedics;  Laterality: Right;  RIGHT SHOULDER ARTHROSCOPY WITH OPEN SUBSCAPULAR REPAIR  . SHOULDER OPEN ROTATOR CUFF REPAIR     3 on rt shoulder; 2 on left  . TRANSFORAMINAL LUMBAR INTERBODY FUSION (TLIF) WITH PEDICLE SCREW FIXATION 1 LEVEL N/A 04/24/2017   Procedure: TRANSFORAMINAL LUMBAR INTERBODY FUSION (TLIF) L4-5;  Surgeon: Melina Schools, MD;  Location: [place];  Service: Orthopedics;  Laterality: N/A;  4 hrs    Family History  Problem Relation Age of Onset  . Heart disease Mother   . Heart disease Father   . Cancer Father   . Cancer Sister     Social History   Tobacco Use  . Smoking status: Former Smoker    Packs/day: 1.00    Years: 20.00    Pack years: 20.00    Types: Cigarettes    Quit date: 03/20/1994    Years since quitting: 26.1  . Smokeless tobacco: Former Systems developer    Types: [place] date: 03/20/1996  Vaping Use  . Vaping Use: Never used  Substance Use Topics  . Alcohol use: Yes    Alcohol/week: 6.0 standard drinks    Types: 6 Cans of beer per week  . Drug use: No    Current Outpatient Medications  Medication Sig Dispense Refill  . Co-Enzyme Q-10 30  MG CAPS Take 30 mg by mouth daily.    . Cyanocobalamin (B-12 PO) Take 1 tablet by mouth daily.    . digoxin (LANOXIN) 0.25 MG tablet Take 1 tablet (0.25 mg total) by mouth daily. 90 tablet 3  . esomeprazole (NEXIUM) 40 MG capsule TAKE 1 CAPSULE BY MOUTH DAILY BEFORE BREAKFAST (Patient taking differently: Take 40 mg by mouth daily at 12 noon.) 90 capsule 2  . ezetimibe (ZETIA) 10 MG tablet TAKE 1 TABLET(10 MG) BY MOUTH EVERY EVENING 90 tablet 3  . FEROSUL 325 (65 Fe) MG tablet Take 325 mg by mouth daily.    . finasteride (PROSCAR) 5 MG tablet Take 5 mg by mouth daily.    . Flaxseed, Linseed, (FLAXSEED OIL MAX STR) 1300 MG CAPS Take 1,300 mg by mouth daily.    . furosemide (LASIX) 80 MG tablet Take 1 tablet (80 mg total) by mouth in the morning. And take 40 mg in the evening. (Patient  taking differently: Take 80 mg by mouth See admin instructions. 80mg  in the morning 40mg  in the evening) 135 tablet 0  . gemfibrozil (LOPID) 600 MG tablet TAKE 1/2 TABLET BY MOUTH TWICE DAILY BEFORE A MEAL (Patient taking differently: Take 300 mg by mouth 2 (two) times daily before a meal.) 90 tablet 3  . Glucosamine HCl (GLUCOSAMINE PO) Take 1 tablet by mouth daily.    . isosorbide mononitrate (IMDUR) 60 MG 24 hr tablet TAKE 1 TABLET(60 MG) BY MOUTH DAILY (Patient taking differently: Take 60 mg by mouth daily.) 90 tablet 3  . metoprolol succinate (TOPROL-XL) 25 MG 24 hr tablet TAKE 1 TABLET(25 MG) BY MOUTH TWICE DAILY (Patient taking differently: Take 25 mg by mouth 2 (two) times daily.) 180 tablet 2  . minocycline (MINOCIN,DYNACIN) 50 MG capsule Take 50 mg by mouth daily.  2  . Multiple Vitamins-Minerals (MULTIVITAMINS THER. W/MINERALS) TABS Take 1 tablet by mouth daily.    . nitroGLYCERIN (NITROSTAT) 0.4 MG SL tablet Place 1 tablet (0.4 mg total) under the tongue every 5 (five) minutes as needed for chest pain. TAKE 1 TABLET UNDER THE TONGUE AS NEEDED AS DIRECTED FOR CHEST PAIN. (Patient taking differently: Place 0.4 mg under the tongue every 5 (five) minutes as needed for chest pain.) 25 tablet 3  . Omega-3 Fatty Acids (FISH OIL PO) Take 1 tablet by mouth daily.    . potassium chloride SA (KLOR-CON) 20 MEQ tablet Take 2 tablets (40 mEq total) by mouth in the morning. And take 20 mEq in the evening. (Patient taking differently: Take 40 mEq by mouth See admin instructions. 70meq in the morning 11meq in the evening) 270 tablet 2  . rivaroxaban (XARELTO) 20 MG TABS tablet Take 1 tablet (20 mg total) by mouth daily with supper.    . simvastatin (ZOCOR) 40 MG tablet TAKE 1 TABLET(40 MG) BY MOUTH AT BEDTIME (Patient taking differently: Take 40 mg by mouth daily at 6 PM.) 90 tablet 2  . tamsulosin (FLOMAX) 0.4 MG CAPS capsule Take 0.4 mg by mouth daily.    . traZODone (DESYREL) 50 MG tablet Take 50 mg by  mouth at bedtime.    . vitamin E 400 UNIT capsule Take 400 Units by mouth daily.     No current facility-administered medications for this visit.    Allergies  Allergen Reactions  . Sotalol Hcl     Feet burning  . Buprenorphine Hcl Itching  . Codeine Itching  . Hydrocodone Itching  . Percocet [Oxycodone-Acetaminophen] Itching  Pt.states he can take but must take with benadryl    Review of Systems:  Constitutional: Denies fever, chills, diaphoresis, appetite change and fatigue.  HEENT: Denies photophobia, eye pain, redness, hearing loss, ear pain, congestion, sore throat, rhinorrhea, sneezing, mouth sores, neck pain, neck stiffness and tinnitus.   Respiratory: Denies SOB, DOE, cough, chest tightness,  and wheezing.   Cardiovascular: Denies chest pain, palpitations and leg swelling.  Genitourinary: Denies dysuria, urgency, frequency, hematuria, flank pain and difficulty urinating.  Musculoskeletal: Denies myalgias, back pain, joint swelling, arthralgias and gait problem.  Skin: No rash.  Neurological: Denies dizziness, seizures, syncope, weakness, light-headedness, numbness and headaches.  Hematological: Denies adenopathy. Easy bruising, personal or family bleeding history  Psychiatric/Behavioral: No anxiety or depression     Physical Exam:    BP 118/66   Pulse 91   Ht 5\' 7"  (1.702 m)   Wt 222 lb (100.7 kg)   BMI 34.77 kg/m  Wt Readings from Last 3 Encounters:  05/15/20 222 lb (100.7 kg)  04/28/20 218 lb 12.8 oz (99.2 kg)  04/24/20 211 lb 3.2 oz (95.8 kg)   Constitutional:  Well-developed, in no acute distress. Psychiatric: Normal mood and affect. Behavior is normal. HEENT: Pupils normal.  Conjunctivae are normal. No scleral icterus. Neck supple.  Cardiovascular: Normal rate, regular rhythm. No edema Pulmonary/chest: Effort normal and breath sounds normal. No wheezing, rales or rhonchi. Abdominal: Soft, nondistended. Nontender. Bowel sounds active throughout. There  are no masses palpable. No hepatomegaly. Rectal: Deferred Neurological: Alert and oriented to person place and time. Skin: Skin is warm and dry. No rashes noted.  Data Reviewed: I have personally reviewed following labs and imaging studies  CBC: CBC Latest Ref Rng & Units 04/28/2020 04/24/2020 04/23/2020  WBC 3.4 - 10.8 x10E3/uL 5.8 6.7 7.0  Hemoglobin 13.0 - 17.7 g/dL 8.9(L) 8.4(L) 8.1(L)  Hematocrit 37.5 - 51.0 % 28.5(L) 27.4(L) 25.2(L)  Platelets 150 - 450 x10E3/uL 263 248 240    CMP: CMP Latest Ref Rng & Units 04/28/2020 04/24/2020 04/23/2020  Glucose 65 - 99 mg/dL 116(H) 112(H) 110(H)  BUN 8 - 27 mg/dL 22 11 16   Creatinine 0.76 - 1.27 mg/dL 1.34(H) 1.08 1.19  Sodium 134 - 144 mmol/L 139 138 141  Potassium 3.5 - 5.2 mmol/L 4.6 3.9 4.3  Chloride 96 - 106 mmol/L 102 108 109  CO2 20 - 29 mmol/L 22 21(L) 24  Calcium 8.6 - 10.2 mg/dL 9.3 8.5(L) 8.7(L)  Total Protein 6.5 - 8.1 g/dL - 6.3(L) -  Total Bilirubin 0.3 - 1.2 mg/dL - 1.1 -  Alkaline Phos 38 - 126 U/L - 45 -  AST 15 - 41 U/L - 21 -  ALT 0 - 44 U/L - 15 -    GFR: Estimated Creatinine Clearance: 52.2 mL/min (A) (by C-G formula based on SCr of 1.34 mg/dL (H)). Liver Function Tests: No results for input(s): AST, ALT, ALKPHOS, BILITOT, PROT, ALBUMIN in the last 168 hours. No results for input(s): LIPASE, AMYLASE in the last 168 hours. No results for input(s): AMMONIA in the last 168 hours. Coagulation Profile: No results for input(s): INR, PROTIME in the last 168 hours. HbA1C: No results for input(s): HGBA1C in the last 72 hours. Lipid Profile: No results for input(s): CHOL, HDL, LDLCALC, TRIG, CHOLHDL, LDLDIRECT in the last 72 hours. Thyroid Function Tests: No results for input(s): TSH, T4TOTAL, FREET4, T3FREE, THYROIDAB in the last 72 hours. Anemia Panel: No results for input(s): VITAMINB12, FOLATE, FERRITIN, TIBC, IRON, RETICCTPCT in the last 72  hours.  No results found for this or any previous visit (from the past 240  hour(s)).    Radiology Studies: DG Chest Port 1 View  Result Date: 04/18/2020 CLINICAL DATA:  Chest pain and shortness of breath EXAM: PORTABLE CHEST 1 VIEW COMPARISON:  04/13/2020 FINDINGS: Upper normal heart size. The lungs appear clear. Mild atherosclerotic calcification of the aortic arch. Mild thoracic spondylosis. No blunting of the costophrenic angles. 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 IMPRESSION: 1. No active cardiopulmonary disease is radiographically apparent. Electronically Signed   By: Van Clines M.D.   On: 04/18/2020 17:01      Carmell Austria, MD 05/15/2020, 10:43 AM  Cc: Cyndi Bender, PA-C

## 2020-05-18 ENCOUNTER — Encounter: Payer: Self-pay | Admitting: Gastroenterology

## 2020-05-26 DIAGNOSIS — Z6835 Body mass index (BMI) 35.0-35.9, adult: Secondary | ICD-10-CM | POA: Diagnosis not present

## 2020-05-26 DIAGNOSIS — Z9181 History of falling: Secondary | ICD-10-CM | POA: Diagnosis not present

## 2020-05-26 DIAGNOSIS — I4891 Unspecified atrial fibrillation: Secondary | ICD-10-CM | POA: Diagnosis not present

## 2020-05-26 DIAGNOSIS — I25118 Atherosclerotic heart disease of native coronary artery with other forms of angina pectoris: Secondary | ICD-10-CM | POA: Diagnosis not present

## 2020-05-26 DIAGNOSIS — D500 Iron deficiency anemia secondary to blood loss (chronic): Secondary | ICD-10-CM | POA: Diagnosis not present

## 2020-06-08 NOTE — Progress Notes (Unsigned)
Cardiology Office Note:    Date:  06/09/2020   ID:  Andrew Hardy., DOB [birth year], MRN [id number]  PCP:  Cyndi Bender, PA-C  Cardiologist:  Shirlee More, MD    Referring MD: Cyndi Bender, PA-C    ASSESSMENT:    1. Persistent atrial fibrillation ([place])   2. High risk medication use   3. Chronic anticoagulation   4. Hypertensive heart disease with heart failure ([place])   5. Iron deficiency anemia due to chronic blood loss    PLAN:    In order of problems listed above:  1. Fortunately he is improved asymptomatic from his heart failure atrial fibrillation has done well since the addition of digoxin for heart rate control along with his beta-blocker remains anticoagulated and at this time I am not going to pursue resuming sinus rhythm with referring for Tikosyn therapy. 2. Continue digoxin check level 3. Continue anticoagulant 4. Nicely compensated he has no edema continue his current loop diuretic 5. Recheck CBC today with recent GI bleed anticoagulated iron deficient   Next appointment: 3 months   Medication Adjustments/Labs and Tests Ordered: Current medicines are reviewed at length with the patient today.  Concerns regarding medicines are outlined above.  No orders of the defined types were placed in this encounter.  No orders of the defined types were placed in this encounter.   Chief Complaint  Patient presents with  . Follow-up  . Atrial Fibrillation    History of Present Illness:    Andrew Hardy. is a 80 y.o. male with a hx of coronary artery disease hypertensive heart disease with heart failure iron deficiency anemia chronic anticoagulation and persistent atrial fibrillation.  Last seen 04/28/2020.  I referred him to EP for consideration of dofetilide therapy or EP intervention.  He remains symptomatic from his atrial fibrillation.He recently was admitted to [facility] 04/18/2020 with symptomatic anemia stool fecal occult blood positive seen by GI  clopidogrel was withdrawn treated with IV Protonix and underwent endoscopic evaluation showing a single bleeding colonic angiectasia with coagulation and transfused 1 unit of packed cells. He was in atrial fibrillation throughout seen by cardiology and continued on digoxin and beta-blocker. He failed to maintain sinus rhythm with cardioversion and previously was intolerant of amiodarone with EYE and neurologic toxicity.  He was seen by Dr. 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  No edema shortness of breath chest pain palpitation or syncope he has had no bleeding with his anticoagulant. Past Medical History:  Diagnosis Date  . Anxiety   . Arthritis   . Atypical atrial flutter ([place]) 12/14/2014  . Bradycardia, sinus 10/15/2014  . Burning sensation of feet 01/17/2016  . CAD, multiple vessel 10/18/2014   Overview:  DES to [facility] for unstable angina 10/17/14 Diagnostic Summary Severe stenosis of mid Cx likely cause of recent accelerating angina Diffuse severe stenosis of small caliber LAD unchanged from 2012 Normal LV function Paroxysmal SVT noted during procedure 04/23/16:  PCI / Resolute Drug Eluting Stent of the proximal Left Anterior Descending Coronary Artery. Successful PTCA of the ostial 1st Diagonal Coronary Artery  . 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 Coronary artery disease 2003   s/p MI  and numerous stents  . Coronary artery disease involving native coronary artery of native heart with angina pectoris ([place]) 10/18/2014   Overview:  DES to [facility] for unstable angina 10/17/14 Diagnostic Summary Severe stenosis of mid Cx likely cause of recent accelerating angina Diffuse severe stenosis of small caliber LAD  unchanged from 2012 Normal LV function Paroxysmal SVT noted during procedure 04/23/16:  PCI / Resolute Drug Eluting Stent of the proximal Left Anterior Descending Coronary Artery. Successful PTCA of the ostial 1st Di  . 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 Degenerative spondylolisthesis 05/02/2017  . Depression   . Enlarged prostate without lower urinary tract symptoms (luts) 2012   frequent urination and nocturia; sees dr [name] Late in [place]  . Essential hypertension 10/15/2014  . Gastroesophageal reflux disease without esophagitis 04/21/2016  . GERD (gastroesophageal reflux disease)   . Hyperlipidemia   . Hypertension   . Hypertensive heart disease with heart failure ([place]) 12/20/2016  . IHD (ischemic heart disease) 10/15/2014  . Lower limb pain, inferior, right 12/17/2017  . Lumbar radiculopathy 02/21/2016  . Myocardial infarction ([place]) 2003  . NSTEMI (non-ST elevated myocardial infarction) ([place]) 04/24/2016   Overview:  LHC 04/24/15: Severe stenosis of the LAD & Diagonal bifurcation  LV ejection fraction is 55-60 %  Interventional Summary Successful Angiosculpt PCI / Resolute Drug Eluting Stent of the proximal LAD Successful PTCA of the ostial 1st Diagonal Coronary Artery  . Paroxysmal SVT (supraventricular tachycardia) ([place]) 11/16/2014  . Precordial pain 10/15/2014  . PVC's (premature ventricular contractions) 04/22/2016  . RBBB 10/15/2014  . S/P lumbar fusion 04/24/2017  . Sacroiliac joint pain 09/10/2018  . Sleep disturbances 01/17/2016  . Snoring 01/17/2016  . Somatic dysfunction of left sacroiliac joint 06/19/2018  . Subscapularis (muscle) sprain 03/29/2011  . 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 BIOPSY  04/24/2020   Procedure: BIOPSY;  Surgeon: Jerene Bears, MD;  Location: [facility] ENDOSCOPY;  Service: Gastroenterology;;  . CARDIAC CATHETERIZATION  [phone]   ptca/st[place] in [place]  . CARDIAC CATHETERIZATION   04/23/2016  . COLONOSCOPY WITH PROPOFOL N/A 04/24/2020   Procedure: COLONOSCOPY WITH PROPOFOL;  Surgeon: Jerene Bears, MD;  Location: [place];  Service: Gastroenterology;  Laterality: N/A;  . CORONARY ANGIOPLASTY    . ESOPHAGOGASTRODUODENOSCOPY (EGD) WITH PROPOFOL N/A 04/24/2020   Procedure: ESOPHAGOGASTRODUODENOSCOPY (EGD) WITH PROPOFOL;  Surgeon: Jerene Bears, MD;  Location: [place];  Service: Gastroenterology;  Laterality: N/A;  . EYE SURGERY  05-1010   bil cataract ,iol  . FRACTURE SURGERY    . HEMOSTASIS CONTROL  04/24/2020   Procedure: HEMOSTASIS CONTROL;  Surgeon: Jerene Bears, MD;  Location: [facility] ENDOSCOPY;  Service: Gastroenterology;;  . I & D EXTREMITY Right 02/23/2015   Procedure: RIGHT RING FINGER REVISION AMPUTATION;  Surgeon: Leanora Cover, MD;  Location: [place];  Service: Orthopedics;  Laterality: Right;  . KNEE ARTHROSCOPY W/ MENISCAL REPAIR  2000   left knee  . LUMBAR EPIDURAL INJECTION  2011   low back pain; DDD  . POLYPECTOMY  04/24/2020   Procedure: POLYPECTOMY;  Surgeon: Jerene Bears, MD;  Location: [facility] END[facility]COPY;  Service: Gastroenterology;;  . ring finger tramatic qamputation Right   . SHOULDER ARTHROSCOPY  03/29/2011   Procedure: ARTHROSCOPY SHOULDER;  Surgeon: Augustin Schooling;  Location: [place];  Service: Orthopedics;  Laterality: Right;  RIGHT SHOULDER ARTHROSCOPY WITH OPEN SUBSCAPULAR REPAIR  . SHOULDER OPEN ROTATOR CUFF REPAIR  3 on rt shoulder; 2 on left  . TRANSFORAMINAL LUMBAR INTERBODY FUSION (TLIF) WITH PEDICLE SCREW FIXATION 1 LEVEL N/A 04/24/2017   Procedure: TRANSFORAMINAL LUMBAR INTERBODY FUSION (TLIF) L4-5;  Surgeon: Melina Schools, MD;  Location: [place];  Service: Orthopedics;  Laterality: N/A;  4 hrs    Current Medications: Current Meds  Medication Sig  . Co-Enzyme Q-10 30 MG CAPS Take 30 mg by mouth daily.  . Cyanocobalamin (B-12 PO) Take 1 tablet by mouth daily.  . digoxin (LANOXIN) 0.25 MG tablet Take 1 tablet (0.25 mg total) by mouth daily.  Marland Kitchen  esomeprazole (NEXIUM) 40 MG capsule TAKE 1 CAPSULE BY MOUTH DAILY BEFORE BREAKFAST (Patient taking differently: Take 40 mg by mouth daily at 12 noon.)  . ezetimibe (ZETIA) 10 MG tablet TAKE 1 TABLET(10 MG) BY MOUTH EVERY EVENING  . FEROSUL 325 (65 Fe) MG tablet Take 325 mg by mouth daily.  . finasteride (PROSCAR) 5 MG tablet Take 5 mg by mouth daily.  . Flaxseed, Linseed, (FLAXSEED OIL MAX STR) 1300 MG CAPS Take 1,300 mg by mouth daily.  . furosemide (LASIX) 80 MG tablet Take 1 tablet (80 mg total) by mouth in the morning. And take 40 mg in the evening. (Patient taking differently: Take 80 mg by mouth See admin instructions. 80mg  in the morning 40mg  in the evening)  . gemfibrozil (LOPID) 600 MG tablet TAKE 1/2 TABLET BY MOUTH TWICE DAILY BEFORE A MEAL (Patient taking differently: Take 300 mg by mouth 2 (two) times daily before a meal.)  . Glucosamine HCl (GLUCOSAMINE PO) Take 1 tablet by mouth daily.  . isosorbide mononitrate (IMDUR) 60 MG 24 hr tablet TAKE 1 TABLET(60 MG) BY MOUTH DAILY (Patient taking differently: Take 60 mg by mouth daily.)  . metoprolol succinate (TOPROL-XL) 25 MG 24 hr tablet TAKE 1 TABLET(25 MG) BY MOUTH TWICE DAILY (Patient taking differently: Take 25 mg by mouth 2 (two) times daily.)  . minocycline (MINOCIN,DYNACIN) 50 MG capsule Take 50 mg by mouth daily.  . Multiple Vitamins-Minerals (MULTIVITAMINS THER. W/MINERALS) TABS Take 1 tablet by mouth daily.  . nitroGLYCERIN (NITROSTAT) 0.4 MG SL tablet Place 1 tablet (0.4 mg total) under the tongue every 5 (five) minutes as needed for chest pain. TAKE 1 TABLET UNDER THE TONGUE AS NEEDED AS DIRECTED FOR CHEST PAIN. (Patient taking differently: Place 0.4 mg under the tongue every 5 (five) minutes as needed for chest pain.)  . Omega-3 Fatty Acids (FISH OIL PO) Take 1 tablet by mouth daily.  . potassium chloride SA (KLOR-CON) 20 MEQ tablet Take 2 tablets (40 mEq total) by mouth in the morning. And take 20 mEq in the evening. (Patient  taking differently: Take 40 mEq by mouth See admin instructions. 71meq in the morning 65meq in the evening)  . rivaroxaban (XARELTO) 20 MG TABS tablet Take 1 tablet (20 mg total) by mouth daily with supper.  . simvastatin (ZOCOR) 40 MG tablet TAKE 1 TABLET(40 MG) BY MOUTH AT BEDTIME (Patient taking differently: Take 40 mg by mouth daily at 6 PM.)  . tamsulosin (FLOMAX) 0.4 MG CAPS capsule Take 0.4 mg by mouth daily.  . traZODone (DESYREL) 50 MG tablet Take 50 mg by mouth at bedtime.  . vitamin E 400 UNIT capsule Take 400 Units by mouth daily.     Allergies:   Sotalol hcl, Buprenorphine hcl, Codeine, Hydrocodone, and Percocet [oxycodone-acetaminophen]   Social History   Socioeconomic History  . Marital status: Married    Spouse name: Not on file  .  Number of children: Not on file  . Years of education: Not on file  . Highest education level: Not on file  Occupational History  . Not on file  Tobacco Use  . Smoking status: Former Smoker    Packs/day: 1.00    Years: 20.00    Pack years: 20.00    Types: Cigarettes    Quit date: 03/20/1994    Years since quitting: 26.2  . Smokeless tobacco: Former Systems developer    Types: [place] date: 03/20/1996  Vaping Use  . Vaping Use: Never used  Substance and Sexual Activity  . Alcohol use: Yes    Alcohol/week: 6.0 standard drinks    Types: 6 Cans of beer per week  . Drug use: No  . Sexual activity: Not on file  Other Topics Concern  . Not on file  Social History Narrative  . Not on file   Social Determinants of Health   Financial Resource Strain: Not on file  Food Insecurity: Not on file  Transportation Needs: Not on file  Physical Activity: Not on file  Stress: Not on file  Social Connections: Not on file     Family History: The patient's family history includes Cancer in his father and sister; Heart disease in his father and mother. ROS:   Please see the history of present illness.    All other systems reviewed and are  negative.  EKGs/Labs/Other Studies Reviewed:    The following studies were reviewed today:    Recent Labs: Digoxin level 1 month ago 1.1 04/13/2020: NT-Pro BNP 521 04/18/2020: B Natriuretic Peptide 107.1 05/15/2020: ALT 9; BUN 17; Creatinine, Ser 1.20; Hemoglobin 11.2; Platelets 285.0; Potassium 4.1; Sodium 140  Recent Lipid Panel    Component Value Date/Time   CHOL 152 04/08/2018 0921   TRIG 198 (H) 04/08/2018 0921   HDL 48 04/08/2018 0921   CHOLHDL 3.2 04/08/2018 0921   LDLCALC 64 04/08/2018 0921    Physical Exam:    VS:  BP 102/68   Pulse 90   Ht 5\' 7"  (1.702 m)   Wt 220 lb (99.8 kg)   SpO2 95%   BMI 34.46 kg/m     Wt Readings from Last 3 Encounters:  06/09/20 220 lb (99.8 kg)  05/15/20 222 lb (100.7 kg)  04/28/20 218 lb 12.8 oz (99.2 kg)     GEN:  Well nourished, well developed in no acute distress HEENT: Normal NECK: No JVD; No carotid bruits LYMPHATICS: No lymphadenopathy CARDIAC: Irregular S1 variable  no murmurs, rubs, gallops RESPIRATORY:  Clear to auscultation without rales, wheezing or rhonchi  ABDOMEN: Soft, non-tender, non-distended MUSCULOSKELETAL:  No edema; No deformity  SKIN: Warm and dry NEUROLOGIC:  Alert and oriented x 3 PSYCHIATRIC:  Normal affect    Signed, Shirlee More, MD  06/09/2020 10:39 AM    [place]

## 2020-06-09 ENCOUNTER — Ambulatory Visit (INDEPENDENT_AMBULATORY_CARE_PROVIDER_SITE_OTHER): Payer: Medicare Other | Admitting: Cardiology

## 2020-06-09 ENCOUNTER — Encounter: Payer: Self-pay | Admitting: Cardiology

## 2020-06-09 ENCOUNTER — Other Ambulatory Visit: Payer: Self-pay

## 2020-06-09 VITALS — BP 102/68 | HR 90 | Ht 67.0 in | Wt 220.0 lb

## 2020-06-09 DIAGNOSIS — I110 Hypertensive heart disease with heart failure: Secondary | ICD-10-CM

## 2020-06-09 DIAGNOSIS — Z7901 Long term (current) use of anticoagulants: Secondary | ICD-10-CM | POA: Diagnosis not present

## 2020-06-09 DIAGNOSIS — I4819 Other persistent atrial fibrillation: Secondary | ICD-10-CM

## 2020-06-09 DIAGNOSIS — Z79899 Other long term (current) drug therapy: Secondary | ICD-10-CM | POA: Diagnosis not present

## 2020-06-09 DIAGNOSIS — D500 Iron deficiency anemia secondary to blood loss (chronic): Secondary | ICD-10-CM

## 2020-06-09 NOTE — Addendum Note (Signed)
Addended by: Resa Miner I on: 06/09/2020 10:43 AM   Modules accepted: Orders

## 2020-06-09 NOTE — Patient Instructions (Addendum)
Medication Instructions:  Your physician recommends that you continue on your current medications as directed. Please refer to the Current Medication list given to you today.  *If you need a refill on your cardiac medications before your next appointment, please call your pharmacy*   Lab Work: Your physician recommends that you return for lab work in: TODAY CBC, BMP, Digoxin level If you have labs (blood work) drawn today and your tests are completely normal, you will receive your results only by: Marland Kitchen MyChart Message (if you have MyChart) OR . A paper copy in the mail If you have any lab test that is abnormal or we need to change your treatment, we will call you to review the results.   Testing/Procedures: None   Follow-Up: At [facility], you and your health needs are our priority.  As part of our continuing mission to provide you with exceptional heart care, we have created designated Provider Care Teams.  These Care Teams include your primary Cardiologist (physician) and Advanced Practice Providers (APPs -  Physician Assistants and Nurse Practitioners) who all work together to provide you with the care you need, when you need it.  We recommend signing up for the patient portal called "MyChart".  Sign up information is provided on this After Visit Summary.  MyChart is used to connect with patients for Virtual Visits (Telemedicine).  Patients are able to view lab/test results, encounter notes, upcoming appointments, etc.  Non-urgent messages can be sent to your provider as well.   To learn more about what you can do with MyChart, go to [email or web address].    Your next appointment:   3 month(s)  The format for your next appointment:   In Person  Provider:   Shirlee More, MD   Other Instructions

## 2020-06-10 LAB — CBC
Hematocrit: 42.9 % (ref 37.5–51.0)
Hemoglobin: 14 g/dL (ref 13.0–17.7)
MCH: 29.2 pg (ref 26.6–33.0)
MCHC: 32.6 g/dL (ref 31.5–35.7)
MCV: 90 fL (ref 79–97)
Platelets: 209 10*3/uL (ref 150–450)
RBC: 4.79 x10E6/uL (ref 4.14–5.80)
RDW: 16.5 % — ABNORMAL HIGH (ref 11.6–15.4)
WBC: 5.8 10*3/uL (ref 3.4–10.8)

## 2020-06-10 LAB — BASIC METABOLIC PANEL
BUN/Creatinine Ratio: 18 (ref 10–24)
BUN: 18 mg/dL (ref 8–27)
CO2: 21 mmol/L (ref 20–29)
Calcium: 9.5 mg/dL (ref 8.6–10.2)
Chloride: 100 mmol/L (ref 96–106)
Creatinine, Ser: 1.02 mg/dL (ref 0.76–1.27)
GFR calc Af Amer: 81 mL/min/{1.73_m2} (ref >59)
GFR calc non Af Amer: 70 mL/min/{1.73_m2} (ref >59)
Glucose: 175 mg/dL — ABNORMAL HIGH (ref 65–99)
Potassium: 4.4 mmol/L (ref 3.5–5.2)
Sodium: 137 mmol/L (ref 134–144)

## 2020-06-10 LAB — DIGOXIN LEVEL: Digoxin, Serum: 1.3 ng/mL — ABNORMAL HIGH (ref 0.5–0.9)

## 2020-06-12 ENCOUNTER — Telehealth: Payer: Self-pay

## 2020-06-12 NOTE — Telephone Encounter (Signed)
-----   Message from Richardo Priest, MD sent at 06/11/2020  2:22 PM EST ----- Good/stable results no changes

## 2020-06-12 NOTE — Telephone Encounter (Signed)
Spoke with patient regarding results and recommendation.  Patient verbalizes understanding and is agreeable to plan of care. Advised patient to call back with any issues or concerns.  

## 2020-06-14 ENCOUNTER — Ambulatory Visit: Payer: Medicare Other | Admitting: Cardiology

## 2020-07-07 ENCOUNTER — Other Ambulatory Visit: Payer: Self-pay | Admitting: Cardiology

## 2020-07-10 ENCOUNTER — Other Ambulatory Visit: Payer: Self-pay | Admitting: Cardiology

## 2020-07-18 DIAGNOSIS — H01001 Unspecified blepharitis right upper eyelid: Secondary | ICD-10-CM | POA: Diagnosis not present

## 2020-07-18 DIAGNOSIS — H5203 Hypermetropia, bilateral: Secondary | ICD-10-CM | POA: Diagnosis not present

## 2020-07-18 DIAGNOSIS — H52223 Regular astigmatism, bilateral: Secondary | ICD-10-CM | POA: Diagnosis not present

## 2020-07-24 ENCOUNTER — Other Ambulatory Visit: Payer: Self-pay

## 2020-07-24 MED ORDER — METOPROLOL SUCCINATE ER 25 MG PO TB24: ORAL_TABLET | ORAL | 2 refills | Status: DC

## 2020-07-24 MED ORDER — POTASSIUM CHLORIDE CRYS ER 20 MEQ PO TBCR: 40.0000 meq | EXTENDED_RELEASE_TABLET | Freq: Every morning | ORAL | 2 refills | Status: DC

## 2020-07-24 MED ORDER — FUROSEMIDE 80 MG PO TABS: ORAL_TABLET | ORAL | 3 refills | Status: DC

## 2020-07-24 MED ORDER — SIMVASTATIN 40 MG PO TABS: ORAL_TABLET | ORAL | 2 refills | Status: DC

## 2020-07-24 MED ORDER — ISOSORBIDE MONONITRATE ER 60 MG PO TB24: ORAL_TABLET | ORAL | 3 refills | Status: DC

## 2020-07-24 NOTE — Telephone Encounter (Signed)
Lasix sent to [facility] per patient request

## 2020-07-24 NOTE — Telephone Encounter (Signed)
Imdur, Simvastatin, Potassium CL, and Metoprolol sent to [facility] per patient request

## 2020-07-26 ENCOUNTER — Telehealth: Payer: Self-pay | Admitting: *Deleted

## 2020-07-26 MED ORDER — GEMFIBROZIL 600 MG PO TABS: 300.0000 mg | ORAL_TABLET | Freq: Two times a day (BID) | ORAL | 3 refills | Status: DC

## 2020-07-26 NOTE — Telephone Encounter (Signed)
Rx refill sent to pharmacy. 

## 2020-08-10 ENCOUNTER — Other Ambulatory Visit: Payer: Self-pay | Admitting: Cardiology

## 2020-08-14 DIAGNOSIS — L820 Inflamed seborrheic keratosis: Secondary | ICD-10-CM | POA: Diagnosis not present

## 2020-08-14 DIAGNOSIS — B079 Viral wart, unspecified: Secondary | ICD-10-CM | POA: Diagnosis not present

## 2020-09-02 ENCOUNTER — Other Ambulatory Visit: Payer: Self-pay | Admitting: Cardiology

## 2020-09-03 ENCOUNTER — Other Ambulatory Visit: Payer: Self-pay | Admitting: Cardiology

## 2020-09-04 DIAGNOSIS — I4891 Unspecified atrial fibrillation: Secondary | ICD-10-CM | POA: Diagnosis not present

## 2020-09-04 DIAGNOSIS — K219 Gastro-esophageal reflux disease without esophagitis: Secondary | ICD-10-CM | POA: Diagnosis not present

## 2020-09-04 DIAGNOSIS — G8929 Other chronic pain: Secondary | ICD-10-CM | POA: Diagnosis not present

## 2020-09-04 DIAGNOSIS — Z6835 Body mass index (BMI) 35.0-35.9, adult: Secondary | ICD-10-CM | POA: Diagnosis not present

## 2020-09-04 DIAGNOSIS — E7800 Pure hypercholesterolemia, unspecified: Secondary | ICD-10-CM | POA: Diagnosis not present

## 2020-09-04 DIAGNOSIS — M549 Dorsalgia, unspecified: Secondary | ICD-10-CM | POA: Diagnosis not present

## 2020-09-04 DIAGNOSIS — Z79899 Other long term (current) drug therapy: Secondary | ICD-10-CM | POA: Diagnosis not present

## 2020-09-04 DIAGNOSIS — R7303 Prediabetes: Secondary | ICD-10-CM | POA: Diagnosis not present

## 2020-09-04 DIAGNOSIS — G4700 Insomnia, unspecified: Secondary | ICD-10-CM | POA: Diagnosis not present

## 2020-09-04 DIAGNOSIS — K922 Gastrointestinal hemorrhage, unspecified: Secondary | ICD-10-CM | POA: Diagnosis not present

## 2020-09-04 DIAGNOSIS — N400 Enlarged prostate without lower urinary tract symptoms: Secondary | ICD-10-CM | POA: Diagnosis not present

## 2020-09-12 NOTE — Progress Notes (Signed)
Cardiology Office Note:    Date:  09/13/2020   ID:  [name]., DOB 20-Oct-1940, MRN [id number]  PCP:  Cyndi Bender, PA-C  Cardiologist:  Shirlee More, MD    Referring MD: Cyndi Bender, PA-C    ASSESSMENT:    1. Persistent atrial fibrillation ([place])   2. High risk medication use   3. Chronic anticoagulation   4. Hypertensive heart disease with heart failure ([place])   5. Coronary artery disease involving native coronary artery of native heart with angina pectoris ([place])    PLAN:    In order of problems listed above:  1. Stable rate controlled continue anticoagulant digoxin reduce frequency every other day and beta-blocker 2. Continue his current anticoagulant 3. Improved BP at target no edema continue his current diuretic 4. Stable CAD [place] Heart Association class I continue medical therapy including lipid-lowering LDL at target   Next appointment: 6 months   Medication Adjustments/Labs and Tests Ordered: Current medicines are reviewed at length with the patient today.  Concerns regarding medicines are outlined above.  No orders of the defined types were placed in this encounter.  No orders of the defined types were placed in this encounter.   Chief Complaint  Patient presents with  . Follow-up  . Atrial Fibrillation    History of Present Illness:    Cosby Proby. is a 80 y.o. male with a hx of coronary artery disease hypertensive heart disease with heart failure iron deficiency anemia chronic anticoagulation and persistent atrial fibrillation.Marland KitchenHe was admitted to [facility] 04/18/2020 with symptomatic anemia stool fecal occult blood positive seen by GI clopidogrel was withdrawn treated with IV Protonix and underwent endoscopic evaluation showing a single bleeding colonic angiectasia with coagulation and transfused 1 unit of packed cells. He was in atrial fibrillation throughout seen by cardiology and continued on digoxin and beta-blocker. He failed to  maintain sinus rhythm with cardioversion and previously was intolerant of amiodarone with EYE and neurologic toxicity  He was last seen 06/09/2020.  Compliance with diet, lifestyle and medications: Yes  Except for back pain is doing much better he is also His A1c is elevated working with his primary care physician His heart rate is well controlled I [place] reduce his digoxin dose to every other day and recent labs are at target with a cholesterol 146 LDL 65 HDL 39 triglycerides 263 A1c 7.6% creatinine 1.19. He has improved and is not having edema shortness of breath palpitation chest pain or syncope.  He tolerates his anticoagulant without bleeding complication and his statin without muscle pain Presents  There is an EKG similar the patient's records Dated and shows sinus tachycardia low voltage in the frontal leads II PVCs Laboratory test 06/15/2020 showed TSH elevated 6.39 T3-T4 normal hemoglobin 14 BMP 11 09/24/1951 moderately elevated creatinine 1.141 potassium 3.9 GFR 68 cc abnormal liver function test with elevated bilirubin 1.7 alkaline phosphatase 310 cholesterol 95 LDL 41 triglycerides 56 HDL 41. Past Medical History:  Diagnosis Date  . Anxiety   . Arthritis   . Atypical atrial flutter ([place]) 12/14/2014  . Bradycardia, sinus 10/15/2014  . Burning sensation of feet 01/17/2016  . CAD, multiple vessel 10/18/2014   Overview:  DES to [facility] for unstable angina 10/17/14 Diagnostic Summary Severe stenosis of mid Cx likely cause of recent accelerating angina Diffuse severe stenosis of small caliber LAD unchanged from 2012 Normal LV function Paroxysmal SVT noted during procedure 04/23/16:  PCI / Resolute Drug Eluting Stent of the  proximal Left Anterior Descending Coronary Artery. Successful PTCA of the ostial 1st Diagonal Coronary Artery  . 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 Coronary artery disease 2003   s/p MI and numerous stents  . Coronary artery disease involving native  coronary artery of native heart with angina pectoris ([place]) 10/18/2014   Overview:  DES to [facility] for unstable angina 10/17/14 Diagnostic Summary Severe stenosis of mid Cx likely cause of recent accelerating angina Diffuse severe stenosis of small caliber LAD unchanged from 2012 Normal LV function Paroxysmal SVT noted during procedure 04/23/16:  PCI / Resolute Drug Eluting Stent of the proximal Left Anterior Descending Coronary Artery. Successful PTCA of the ostial 1st Di  . 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 Degenerative spondylolisthesis 05/02/2017  . Depression   . Enlarged prostate without lower urinary tract symptoms (l[place]) 2012   frequent urination and nocturia; sees dr [name] Late in [place]  . Essential hypertension 10/15/2014  . Gastroesophageal reflux disease without esophagitis 04/21/2016  . GERD (gastroesophageal reflux disease)   . Hyperlipidemia   . Hypertension   . Hypertensive heart disease with heart failure ([place]) 12/20/2016  . IHD (ischemic heart disease) 10/15/2014  . Lower limb pain, inferior, right 12/17/2017  . Lumbar radiculopathy 02/21/2016  . Myocardial infarction ([place]) 2003  . NSTEMI (non-ST elevated myocardial infarction) ([place]) 04/24/2016   Overview:  LHC 04/24/15: Severe stenosis of the LAD & Diagonal bifurcation  LV ejection fraction is 55-60 %  Interventional Summary Successful Angiosculpt PCI / Resolute Drug Eluting Stent of the proximal LAD Successful PTCA of the ostial 1st Diagonal Coronary Artery  . Paroxysmal SVT (supraventricular tachycardia) ([place]) 11/16/2014  . Precordial pain 10/15/2014  . PVC's (premature ventricular contractions) 04/22/2016  . RBBB 10/15/2014  . S/P lumbar fusion 04/24/2017  . Sacroiliac joint pain 09/10/2018  . Sleep disturbances 01/17/2016  . Snoring 01/17/2016  . Somatic dysfunction of left sacroiliac joint 06/19/2018  . Subscapularis (muscle) sprain 03/29/2011  . 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 BIOPSY  04/24/2020   Procedure: BIOPSY;  Surgeon: Jerene Bears, MD;  Location[facility] ENDOSCOPY;  Service: Gastroenterology;;  . CARDIAC CATHETERIZATION  [phone]   ptca/[place] in [place]  . CARDIAC CATHETERIZATION  04/23/2016  . COLONOSCOPY WITH PROPOFOL N/A 04/24/2020   Procedure: COLONOSCOPY WITH PROPOFOL;  Surgeon: Jerene Bears, MD;  Location: [place];  Service: Gastroenterology;  Laterality: N/A;  . CORONARY ANGIOPLASTY    . ESOPHAGOGASTRODUODENOSCOPY (EGD) WITH PROPOFOL N/A 04/24/2020   Procedure: ESOPHAGOGASTRODUODENOSCOPY (EGD) WITH PROPOFOL;  Surgeon: Jerene Bears, MD;  Location: [place];  Service: Gastroenterology;  Laterality: N/A;  . EYE SURGERY  05-1010   bil cataract ,iol  . FRACTURE SURGERY    . HEMOSTASIS CONTROL  04/24/2020   Procedure: HEMOSTASIS CONTROL;  Surgeon: Jerene Bears, MD;  Location: [facility] ENDOSCOPY;  Service: Gastroenterology;;  . I & D EXTREMITY Right 02/23/2015   Procedure: RIGHT RING FINGER REVISION AMPUTATION;  Surgeon: Leanora Cover, MD;  Location: [place];  Service: Orthopedics;  Laterality: Right;  . KNEE ARTHROSCOPY W/ MENISCAL REPAIR  2000   left knee  . LUMBAR EPIDURAL INJECTION  2011   low back pain; DDD  . POLYPECTOMY  04/24/2020   Procedure: POLYPECTOMY;  Surgeon: Jerene Bears, MD;  Location[facility] ENDOSCOPY;  Service: Gastroenterology;;  . ring finger tramatic qamputation Right   . SHOULDER ARTHROSCOPY  03/29/2011  Procedure: ARTHROSCOPY SHOULDER;  Surgeon: Augustin Schooling;  Location: [place];  Service: Orthopedics;  Laterality: Right;  RIGHT SHOULDER ARTHROSCOPY WITH OPEN SUBSCAPULAR REPAIR  . SHOULDER OPEN ROTATOR CUFF REPAIR     3 on rt shoulder; 2 on left  . TRANSFORAMINAL LUMBAR INTERBODY FUSION (TLIF) WITH PEDICLE SCREW FIXATION 1 LEVEL N/A 04/24/2017   Procedure: TRANSFORAMINAL LUMBAR INTERBODY FUSION (TLIF) L4-5;  Surgeon: Melina Schools, MD;  Location: [place];  Service: Orthopedics;  Laterality: N/A;  4 hrs     Current Medications: Current Meds  Medication Sig  . Co-Enzyme Q-10 30 MG CAPS Take 30 mg by mouth daily.  . Cyanocobalamin (B-12 PO) Take 1 tablet by mouth daily.  . digoxin (LANOXIN) 0.25 MG tablet Take 1 tablet (0.25 mg total) by mouth daily.  . DULoxetine (CYMBALTA) 30 MG capsule Take 30 mg by mouth 2 (two) times daily.  Marland Kitchen esomeprazole (NEXIUM) 40 MG capsule TAKE 1 CAPSULE BY MOUTH DAILY BEFORE BREAKFAST (Patient taking differently: Take 40 mg by mouth daily at 12 noon.)  . ezetimibe (ZETIA) 10 MG tablet TAKE 1 TABLET(10 MG) BY MOUTH EVERY EVENING  . FEROSUL 325 (65 Fe) MG tablet Take 325 mg by mouth daily.  . finasteride (PROSCAR) 5 MG tablet Take 5 mg by mouth daily.  . Flaxseed, Linseed, (FLAXSEED OIL MAX STR) 1300 MG CAPS Take 1,300 mg by mouth daily.  . furosemide (LASIX) 80 MG tablet TAKE 1 TABLET BY MOUTH EVERY MORNING, AND 1/2 TABLET IN THE EVENING  . gemfibrozil (LOPID) 600 MG tablet Take 0.5 tablets (300 mg total) by mouth 2 (two) times daily before a meal.  . Glucosamine HCl (GLUCOSAMINE PO) Take 1 tablet by mouth daily.  . isosorbide mononitrate (IMDUR) 60 MG 24 hr tablet TAKE 1 TABLET(60 MG) BY MOUTH DAILY  . metoprolol succinate (TOPROL-XL) 25 MG 24 hr tablet TAKE 1 TABLET(25 MG) BY MOUTH TWICE DAILY  . minocycline (MINOCIN,DYNACIN) 50 MG capsule Take 50 mg by mouth daily.  . Multiple Vitamins-Minerals (MULTIVITAMINS THER. W/MINERALS) TABS Take 1 tablet by mouth daily.  . nitroGLYCERIN (NITROSTAT) 0.4 MG SL tablet Place 1 tablet (0.4 mg total) under the tongue every 5 (five) minutes as needed for chest pain. TAKE 1 TABLET UNDER THE TONGUE AS NEEDED AS DIRECTED FOR CHEST PAIN. (Patient taking differently: Place 0.4 mg under the tongue every 5 (five) minutes as needed for chest pain.)  . Omega-3 Fatty Acids (FISH OIL PO) Take 1 tablet by mouth daily.  . potassium chloride SA (KLOR-CON) 20 MEQ tablet Take 2 tablets (40 mEq total) by mouth in the morning. And take 20 mEq in  the evening.  . rivaroxaban (XARELTO) 20 MG TABS tablet Take 1 tablet (20 mg total) by mouth daily with supper.  . simvastatin (ZOCOR) 40 MG tablet TAKE 1 TABLET(40 MG) BY MOUTH AT BEDTIME  . tamsulosin (FLOMAX) 0.4 MG CAPS capsule Take 0.4 mg by mouth daily.  . traZODone (DESYREL) 50 MG tablet Take 50 mg by mouth at bedtime.  . vitamin E 400 UNIT capsule Take 400 Units by mouth daily.  . [DISCONTINUED] digoxin (LANOXIN) 0.25 MG tablet Take 1 tablet (0.25 mg total) by mouth daily.  . [DISCONTINUED] ezetimibe (ZETIA) 10 MG tablet TAKE 1 TABLET(10 MG) BY MOUTH EVERY EVENING  . [DISCONTINUED] isosorbide mononitrate (IMDUR) 60 MG 24 hr tablet TAKE 1 TABLET(60 MG) BY MOUTH DAILY  . [DISCONTINUED] simvastatin (ZOCOR) 40 MG tablet TAKE 1 TABLET(40 MG) BY MOUTH AT BEDTIME  Allergies:   Sotalol hcl, Buprenorphine hcl, Codeine, Hydrocodone, and Percocet [oxycodone-acetaminophen]   Social History   Socioeconomic History  . Marital status: Married    Spouse name: Not on file  . Number of children: Not on file  . Years of education: Not on file  . Highest education level: Not on file  Occupational History  . Not on file  Tobacco Use  . Smoking status: Former Smoker    Packs/day: 1.00    Years: 20.00    Pack years: 20.00    Types: Cigarettes    Quit date: 03/20/1994    Years since quitting: 26.5  . Smokeless tobacco: Former Systems developer    Types: [place] date: 03/20/1996  Vaping Use  . Vaping Use: Never used  Substance and Sexual Activity  . Alcohol use: Yes    Alcohol/week: 6.0 standard drinks    Types: 6 Cans of beer per week  . Drug use: No  . Sexual activity: Not on file  Other Topics Concern  . Not on file  Social History Narrative  . Not on file   Social Determinants of Health   Financial Resource Strain: Not on file  Food Insecurity: Not on file  Transportation Needs: Not on file  Physical Activity: Not on file  Stress: Not on file  Social Connections: Not on file      Family History: The patient's family history includes Cancer in his father and sister; Heart disease in his father and mother. ROS:   Please see the history of present illness.    All other systems reviewed and are negative.  EKGs/Labs/Other Studies Reviewed:    The following studies were reviewed today  Recent Labs: 04/13/2020: NT-Pro BNP 521 04/18/2020: B Natriuretic Peptide 107.1 05/15/2020: ALT 9 06/09/2020: BUN 18; Creatinine, Ser 1.02; Hemoglobin 14.0; Platelets 209; Potassium 4.4; Sodium 137  Recent Lipid Panel    Component Value Date/Time   CHOL 152 04/08/2018 0921   TRIG 198 (H) 04/08/2018 0921   HDL 48 04/08/2018 0921   CHOLHDL 3.2 04/08/2018 0921   LDLCALC 64 04/08/2018 0921    Physical Exam:    VS:  BP 106/72   Pulse (!) 56   Ht 5\' 7"  (1.702 m)   Wt 220 lb 3.2 oz (99.9 kg)   SpO2 96%   BMI 34.49 kg/m     Wt Readings from Last 3 Encounters:  09/13/20 220 lb 3.2 oz (99.9 kg)  06/09/20 220 lb (99.8 kg)  05/15/20 222 lb (100.7 kg)     GEN:  Well nourished, well developed in no acute distress HEENT: Normal NECK: No JVD; No carotid bruits LYMPHATICS: No lymphadenopathy CARDIAC: Irregular variable first heart  no murmurs, rubs, gallops RESPIRATORY:  Clear to auscultation without rales, wheezing or rhonchi  ABDOMEN: Soft, non-tender, non-distended MUSCULOSKELETAL:  No edema; No deformity  SKIN: Warm and dry NEUROLOGIC:  Alert and oriented x 3 PSYCHIATRIC:  Normal affect    Signed, Shirlee More, MD  09/13/2020 8:33 AM    [place]

## 2020-09-13 ENCOUNTER — Ambulatory Visit (INDEPENDENT_AMBULATORY_CARE_PROVIDER_SITE_OTHER): Payer: Medicare Other | Admitting: Cardiology

## 2020-09-13 ENCOUNTER — Encounter: Payer: Self-pay | Admitting: Cardiology

## 2020-09-13 ENCOUNTER — Other Ambulatory Visit: Payer: Self-pay

## 2020-09-13 VITALS — BP 106/72 | HR 56 | Ht 67.0 in | Wt 220.2 lb

## 2020-09-13 DIAGNOSIS — I25119 Atherosclerotic heart disease of native coronary artery with unspecified angina pectoris: Secondary | ICD-10-CM

## 2020-09-13 DIAGNOSIS — Z79899 Other long term (current) drug therapy: Secondary | ICD-10-CM

## 2020-09-13 DIAGNOSIS — I110 Hypertensive heart disease with heart failure: Secondary | ICD-10-CM | POA: Diagnosis not present

## 2020-09-13 DIAGNOSIS — I4819 Other persistent atrial fibrillation: Secondary | ICD-10-CM | POA: Diagnosis not present

## 2020-09-13 DIAGNOSIS — Z7901 Long term (current) use of anticoagulants: Secondary | ICD-10-CM | POA: Diagnosis not present

## 2020-09-13 MED ORDER — EZETIMIBE 10 MG PO TABS: ORAL_TABLET | ORAL | 3 refills | Status: DC

## 2020-09-13 MED ORDER — SIMVASTATIN 40 MG PO TABS: ORAL_TABLET | ORAL | 3 refills | Status: DC

## 2020-09-13 MED ORDER — DIGOXIN 250 MCG PO TABS: 0.2500 mg | ORAL_TABLET | Freq: Every day | ORAL | 3 refills | Status: DC

## 2020-09-13 MED ORDER — DIGOXIN 250 MCG PO TABS: 0.2500 mg | ORAL_TABLET | ORAL | 3 refills | Status: DC

## 2020-09-13 MED ORDER — NITROGLYCERIN 0.4 MG SL SUBL: 0.4000 mg | SUBLINGUAL_TABLET | SUBLINGUAL | 0 refills | Status: DC | PRN

## 2020-09-13 MED ORDER — ISOSORBIDE MONONITRATE ER 60 MG PO TB24: ORAL_TABLET | ORAL | 3 refills | Status: DC

## 2020-09-13 NOTE — Patient Instructions (Signed)
Medication Instructions:  Your physician has recommended you make the following change in your medication:  DECREASE: Digoxin take one tablet by mouth every other day.  *If you need a refill on your cardiac medications before your next appointment, please call your pharmacy*   Lab Work: None If you have labs (blood work) drawn today and your tests are completely normal, you will receive your results only by: Marland Kitchen MyChart Message (if you have MyChart) OR . A paper copy in the mail If you have any lab test that is abnormal or we need to change your treatment, we will call you to review the results.   Testing/Procedures: None   Follow-Up: At [facility], you and your health needs are our priority.  As part of our continuing mission to provide you with exceptional heart care, we have created designated Provider Care Teams.  These Care Teams include your primary Cardiologist (physician) and Advanced Practice Providers (APPs -  Physician Assistants and Nurse Practitioners) who all work together to provide you with the care you need, when you need it.  We recommend signing up for the patient portal called "MyChart".  Sign up information is provided on this After Visit Summary.  MyChart is used to connect with patients for Virtual Visits (Telemedicine).  Patients are able to view lab/test results, encounter notes, upcoming appointments, etc.  Non-urgent messages can be sent to your provider as well.   To learn more about what you can do with MyChart, go to [email or web address].    Your next appointment:   6 month(s)  The format for your next appointment:   In Person  Provider:   Shirlee More, MD   Other Instructions

## 2020-09-24 ENCOUNTER — Other Ambulatory Visit: Payer: Self-pay | Admitting: Cardiology

## 2020-09-25 NOTE — Telephone Encounter (Signed)
21m, 99.9kg, scr 1.02 06/09/20, lovw/munley 09/13/20, ccr 84.3

## 2020-11-13 ENCOUNTER — Other Ambulatory Visit: Payer: Self-pay | Admitting: Cardiology

## 2020-11-13 DIAGNOSIS — R7303 Prediabetes: Secondary | ICD-10-CM | POA: Diagnosis not present

## 2020-11-13 DIAGNOSIS — I25118 Atherosclerotic heart disease of native coronary artery with other forms of angina pectoris: Secondary | ICD-10-CM | POA: Diagnosis not present

## 2020-11-13 DIAGNOSIS — I4891 Unspecified atrial fibrillation: Secondary | ICD-10-CM | POA: Diagnosis not present

## 2020-11-13 DIAGNOSIS — Z6834 Body mass index (BMI) 34.0-34.9, adult: Secondary | ICD-10-CM | POA: Diagnosis not present

## 2021-01-31 DIAGNOSIS — Z23 Encounter for immunization: Secondary | ICD-10-CM | POA: Diagnosis not present

## 2021-02-01 DIAGNOSIS — M25512 Pain in left shoulder: Secondary | ICD-10-CM | POA: Diagnosis not present

## 2021-02-01 DIAGNOSIS — M75121 Complete rotator cuff tear or rupture of right shoulder, not specified as traumatic: Secondary | ICD-10-CM | POA: Diagnosis not present

## 2021-02-01 DIAGNOSIS — M25511 Pain in right shoulder: Secondary | ICD-10-CM | POA: Diagnosis not present

## 2021-02-06 DIAGNOSIS — M75120 Complete rotator cuff tear or rupture of unspecified shoulder, not specified as traumatic: Secondary | ICD-10-CM | POA: Insufficient documentation

## 2021-02-06 HISTORY — DX: Complete rotator cuff tear or rupture of unspecified shoulder, not specified as traumatic: M75.120

## 2021-03-08 ENCOUNTER — Other Ambulatory Visit: Payer: Self-pay | Admitting: Cardiology

## 2021-03-11 NOTE — Progress Notes (Signed)
Cardiology Office Note:    Date:  03/12/2021   ID:  Andrew Hardy., DOB 1941/03/16, MRN [id number]  PCP:  Cyndi Bender, PA-C  Cardiologist:  Shirlee More, MD    Referring MD: Cyndi Bender, PA-C    ASSESSMENT:    1. Persistent atrial fibrillation ([place])   2. High risk medication use   3. Chronic anticoagulation   4. Iron deficiency anemia due to chronic blood loss   5. Coronary artery disease involving native coronary artery of native heart with angina pectoris ([place])   6. Hypertensive heart disease with heart failure ([place])   7. Mixed hyperlipidemia    PLAN:    In order of problems listed above:  Overall Andrew Hardy is doing well with his multiple cardiovascular problems.  His atrial fibrillation is rate controlled with low-dose digoxin beta-blocker and he continues with his anticoagulant. Check digoxin level goal less than 1 to avoid excess mortality with heart failure and atrial fibrillation Continue his anticoagulant therapy Recheck CBC Stable CAD having no anginal discomfort [place] Heart Association class I continue medical therapy BP at target continue current treatment including insulin diuretic check renal function proBNP level Currently statin intolerant on Zetia continue simvastatin check lipids   Next appointment: 6 months   Medication Adjustments/Labs and Tests Ordered: Current medicines are reviewed at length with the patient today.  Concerns regarding medicines are outlined above.  No orders of the defined types were placed in this encounter.  No orders of the defined types were placed in this encounter.   Chief Complaint  Patient presents with   Follow-up   Atrial Fibrillation   Anticoagulation   Congestive Heart Failure   Coronary Artery Disease    History of Present Illness:    Andrew Hardy. is a 80 y.o. male with a hx of coronary artery disease hypertensive heart disease with heart failure iron deficiency anemia chronic anticoagulation and  persistent atrial fibrillation.  He was last seen 09/13/2020.  Time his last visit his atrial fibrillation was stable rate controlled with digoxin and beta-blocker and was continued on his anticoagulant.He was admitted to [facility] 04/18/2020 with symptomatic anemia stool fecal occult blood positive seen by GI clopidogrel was withdrawn treated with IV Protonix and underwent endoscopic evaluation showing a single bleeding colonic angiectasia with coagulation and transfused 1 unit of packed cells. He was in atrial fibrillation throughout seen by cardiology and continued on digoxin and beta-blocker. He failed to maintain sinus rhythm with cardioversion and previously was intolerant of amiodarone with EYE and neurologic toxicity.  Compliance with diet, lifestyle and medications: Yes  [name] is doing well he has had no ED visits and is not having edema shortness of breath palpitation chest pain no bleeding from his combined antiplatelet anticoagulant tolerates digoxin without signs of GI toxicity nausea or vomiting. Past Medical History:  Diagnosis Date   Anxiety    Arthritis    Atypical atrial flutter ([place]) 12/14/2014   Bradycardia, sinus 10/15/2014   Burning sensation of feet 01/17/2016   CAD, multiple vessel 10/18/2014   Overview:  DES to [facility] for unstable angina 10/17/14 Diagnostic Summary Severe stenosis of mid Cx likely cause of recent accelerating angina Diffuse severe stenosis of small caliber LAD unchanged from 2012 Normal LV function Paroxysmal SVT noted during procedure 04/23/16:  PCI / Resolute Drug Eluting Stent of the proximal Left Anterior Descending Coronary Artery. Successful PTCA of the ostial 1st Diagonal Coronary Artery   Chronic left-sided low  back pain with sciatica 02/21/2016   Coronary artery disease 2003   s/p MI and numerous stents   Coronary artery disease involving native coronary artery of native heart with angina pectoris ([place]) 10/18/2014   Overview:  DES to [facility] for  unstable angina 10/17/14 Diagnostic Summary Severe stenosis of mid Cx likely cause of recent accelerating angina Diffuse severe stenosis of small caliber LAD unchanged from 2012 Normal LV function Paroxysmal SVT noted during procedure 04/23/16:  PCI / Resolute Drug Eluting Stent of the proximal Left Anterior Descending Coronary Artery. 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   Degenerative spondylolisthesis 05/02/2017   Depression    Enlarged prostate without lower urinary tract symptoms (luts) 2012   frequent urination and nocturia; sees dr [name] Late in [place]   Essential hypertension 10/15/2014   Gastroesophageal reflux disease without esophagitis 04/21/2016   GERD (gastroesophageal reflux disease)    Hyperlipidemia    Hypertension    Hypertensive heart disease with heart failure ([place]) 12/20/2016   IHD (ischemic heart disease) 10/15/2014   Lower limb pain, inferior, right 12/17/2017   Lumbar radiculopathy 02/21/2016   Myocardial infarction [facility]) 2003   NSTEMI (non-ST elevated myocardial infarction) ([place]) 04/24/2016   Overview:  LHC 04/24/15: Severe stenosis of the LAD & Diagonal bifurcation  LV ejection fraction is 55-60 %  Interventional Summary Successful Angiosculpt PCI / Resolute Drug Eluting Stent of the proximal LAD Successful PTCA of the ostial 1st Diagonal Coronary Artery   Paroxysmal SVT (supraventricular tachycardia) ([place]) 11/16/2014   Precordial pain 10/15/2014   PVC's (premature ventricular contractions) 04/22/2016   RBBB 10/15/2014   S/P lumbar fusion 04/24/2017   Sacroiliac joint pain 09/10/2018   Sleep disturbances 01/17/2016   Snoring 01/17/2016   Somatic dysfunction of left sacroiliac joint 06/19/2018   Subscapularis (muscle) sprain 03/29/2011   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    Past Surgical History:  Procedure Laterality Date   BIOPSY  04/24/2020   Procedure: BIOPSY;  Surgeon: Jerene Bears, MD;  Location: [place];  Service: Gastroenterology;;   CARDIAC CATHETERIZATION  10-2010   ptca/st[place] in [place]   CARDIAC CATHETERIZATION  04/23/2016   COLONOSCOPY WITH PROPOFOL N/A 04/24/2020   Procedure: COLONOSCOPY WITH PROPOFOL;  Surgeon: Jerene Bears, MD;  Location: [place];  Service: Gastroenterology;  Laterality: N/A;   CORONARY ANGIOPLASTY     ESOPHAGOGASTRODUODENOSCOPY (EGD) WITH PROPOFOL N/A 04/24/2020   Procedure: ESOPHAGOGASTRODUODENOSCOPY (EGD) WITH PROPOFOL;  Surgeon: Jerene Bears, MD;  Location: [facility] ENDOSCOPY;  Service: Gastroenterology;  Laterality: N/A;   EYE SURGERY  05-1010   bil cataract ,iol   FRACTURE SURGERY     HEMOSTASIS CONTROL  04/24/2020   Procedure: HEMOSTASIS CONTROL;  Surgeon: Jerene Bears, MD;  Location: [facility] ENDOSCOPY;  Service: Gastroenterology;;   I & D EXTREMITY Right 02/23/2015   Procedure: RIGHT RING FINGER REVISION AMPUTATION;  Surgeon: Leanora Cover, MD;  Location: [place];  Service: Orthopedics;  Laterality: Right;   KNEE ARTHROSCOPY W/ MENISCAL REPAIR  2000   left knee   LUMBAR EPIDURAL INJECTION  2011   low back pain; DDD   POLYPECTOMY  04/24/2020   Procedure: POLYPECTOMY;  Surgeon: Jerene Bears, MD;  Location: [facility] ENDOSCOPY;  Service: Gastroenterology;;   ring finger tramatic qamputation Right    SHOULDER ARTHROSCOPY  03/29/2011   Procedure: ARTHROSCOPY SHOULDER;  Surgeon: Augustin Schooling;  Location: [place];  Service: Orthopedics;  Laterality: Right;  RIGHT  SHOULDER ARTHROSCOPY WITH OPEN SUBSCAPULAR REPAIR   SHOULDER OPEN ROTATOR CUFF REPAIR     3 on rt shoulder; 2 on left   TRANSFORAMINAL LUMBAR INTERBODY FUSION (TLIF) WITH PEDICLE SCREW FIXATION 1 LEVEL N/A 04/24/2017   Procedure: TRANSFORAMINAL LUMBAR INTERBODY FUSION (TLIF) L4-5;  Surgeon: Melina Schools, MD;  Location: [place];  Service: Orthopedics;  Laterality: N/A;  4 hrs    Current Medications: Current Meds  Medication Sig   Co-Enzyme Q-10 30 MG CAPS Take 30 mg by mouth daily.   Cyanocobalamin (B-12 PO)  Take 1 tablet by mouth daily.   digoxin (LANOXIN) 0.25 MG tablet TAKE 1 TABLET(0.25 MG) BY MOUTH DAILY   DULoxetine (CYMBALTA) 30 MG capsule Take 30 mg by mouth 2 (two) times daily.   esomeprazole (NEXIUM) 40 MG capsule Take 1 capsule (40 mg total) by mouth daily at 12 noon.   ezetimibe (ZETIA) 10 MG tablet TAKE 1 TABLET(10 MG) BY MOUTH EVERY EVENING   finasteride (PROSCAR) 5 MG tablet Take 5 mg by mouth daily.   Flaxseed, Linseed, (FLAXSEED OIL MAX STR) 1300 MG CAPS Take 1,300 mg by mouth daily.   furosemide (LASIX) 80 MG tablet TAKE 1 TABLET BY MOUTH EVERY MORNING, AND 1/2 TABLET IN THE EVENING   gemfibrozil (LOPID) 600 MG tablet Take 0.5 tablets (300 mg total) by mouth 2 (two) times daily before a meal.   Glucosamine HCl (GLUCOSAMINE PO) Take 1 tablet by mouth daily.   isosorbide mononitrate (IMDUR) 60 MG 24 hr tablet TAKE 1 TABLET(60 MG) BY MOUTH DAILY   metoprolol succinate (TOPROL-XL) 25 MG 24 hr tablet TAKE 1 TABLET(25 MG) BY MOUTH TWICE DAILY   minocycline (MINOCIN,DYNACIN) 50 MG capsule Take 50 mg by mouth daily.   Multiple Vitamins-Minerals (MULTIVITAMINS THER. W/MINERALS) TABS Take 1 tablet by mouth daily.   nitroGLYCERIN (NITROSTAT) 0.4 MG SL tablet Place 1 tablet (0.4 mg total) under the tongue every 5 (five) minutes as needed for chest pain. TAKE 1 TABLET UNDER THE TONGUE AS NEEDED AS DIRECTED FOR CHEST PAIN.   Omega-3 Fatty Acids (FISH OIL PO) Take 1 tablet by mouth daily.   potassium chloride SA (KLOR-CON) 20 MEQ tablet Take 2 tablets (40 mEq total) by mouth in the morning. And take 20 mEq in the evening.   PUMPKIN SEED PO Take 1 tablet by mouth daily.   simvastatin (ZOCOR) 40 MG tablet TAKE 1 TABLET(40 MG) BY MOUTH AT BEDTIME   tamsulosin (FLOMAX) 0.4 MG CAPS capsule Take 0.4 mg by mouth daily.   traZODone (DESYREL) 50 MG tablet Take 50 mg by mouth at bedtime.   TURMERIC CURCUMIN PO Take 1 capsule by mouth daily.   vitamin E 400 UNIT capsule Take 400 Units by mouth daily.    XARELTO 20 MG TABS tablet TAKE 1 TABLET(20 MG) BY MOUTH DAILY WITH SUPPER     Allergies:   Sotalol hcl, Buprenorphine hcl, Codeine, Hydrocodone, and Percocet [oxycodone-acetaminophen]   Social History   Socioeconomic History   Marital status: Married    Spouse name: Not on file   Number of children: Not on file   Years of education: Not on file   Highest education level: Not on file  Occupational History   Not on file  Tobacco Use   Smoking status: Former    Packs/day: 1.00    Years: 20.00    Pack years: 20.00    Types: Cigarettes    Quit date: 03/20/1994    Years since quitting: 26.9   Smokeless  tobacco: Former    Types: Chew    Quit date: 03/20/1996  Vaping Use   Vaping Use: Never used  Substance and Sexual Activity   Alcohol use: Yes    Alcohol/week: 6.0 standard drinks    Types: 6 Cans of beer per week   Drug use: No   Sexual activity: Not on file  Other Topics Concern   Not on file  Social History Narrative   Not on file   Social Determinants of Health   Financial Resource Strain: Not on file  Food Insecurity: Not on file  Transportation Needs: Not on file  Physical Activity: Not on file  Stress: Not on file  Social Connections: Not on file     Family History: The patient's family history includes Cancer in his father and sister; Heart disease in his father and mother. ROS:   Please see the history of present illness.    All other systems reviewed and are negative.  EKGs/Labs/Other Studies Reviewed:    The following studies were reviewed today:  EKG:  EKG ordered today and personally reviewed.  The ekg ordered today demonstrates atrial fibrillation controlled ventricular rate right bundle branch block  Recent Labs: 04/13/2020: NT-Pro BNP 521 04/18/2020: B Natriuretic Peptide 107.1 05/15/2020: ALT 9 06/09/2020: BUN 18; Creatinine, Ser 1.02; Hemoglobin 14.0; Platelets 209; Potassium 4.4; Sodium 137  Recent Lipid Panel    Component Value Date/Time    CHOL 152 04/08/2018 0921   TRIG 198 (H) 04/08/2018 0921   HDL 48 04/08/2018 0921   CHOLHDL 3.2 04/08/2018 0921   LDLCALC 64 04/08/2018 0921    Physical Exam:    VS:  BP 112/64   Pulse 95   Ht 5\' 7"  (1.702 m)   Wt 218 lb 12.8 oz (99.2 kg)   SpO2 99%   BMI 34.27 kg/m     Wt Readings from Last 3 Encounters:  03/12/21 218 lb 12.8 oz (99.2 kg)  09/13/20 220 lb 3.2 oz (99.9 kg)  06/09/20 220 lb (99.8 kg)     GEN: Appears his age well nourished, well developed in no acute distress HEENT: Normal NECK: No JVD; No carotid bruits LYMPHATICS: No lymphadenopathy CARDIAC: Irregular rate and rhythm RRR, no murmurs, rubs, gallops RESPIRATORY:  Clear to auscultation without rales, wheezing or rhonchi  ABDOMEN: Soft, non-tender, non-distended MUSCULOSKELETAL:  No edema; No deformity  SKIN: Warm and dry NEUROLOGIC:  Alert and oriented x 3 PSYCHIATRIC:  Normal affect    Signed, Shirlee More, MD  03/12/2021 8:12 AM    [place]

## 2021-03-12 ENCOUNTER — Ambulatory Visit (INDEPENDENT_AMBULATORY_CARE_PROVIDER_SITE_OTHER): Payer: Medicare Other | Admitting: Cardiology

## 2021-03-12 ENCOUNTER — Encounter: Payer: Self-pay | Admitting: Cardiology

## 2021-03-12 ENCOUNTER — Other Ambulatory Visit: Payer: Self-pay

## 2021-03-12 VITALS — BP 112/64 | HR 95 | Ht 67.0 in | Wt 218.8 lb

## 2021-03-12 DIAGNOSIS — I110 Hypertensive heart disease with heart failure: Secondary | ICD-10-CM

## 2021-03-12 DIAGNOSIS — Z7901 Long term (current) use of anticoagulants: Secondary | ICD-10-CM

## 2021-03-12 DIAGNOSIS — E782 Mixed hyperlipidemia: Secondary | ICD-10-CM

## 2021-03-12 DIAGNOSIS — I25119 Atherosclerotic heart disease of native coronary artery with unspecified angina pectoris: Secondary | ICD-10-CM | POA: Diagnosis not present

## 2021-03-12 DIAGNOSIS — I4819 Other persistent atrial fibrillation: Secondary | ICD-10-CM

## 2021-03-12 DIAGNOSIS — Z79899 Other long term (current) drug therapy: Secondary | ICD-10-CM | POA: Diagnosis not present

## 2021-03-12 DIAGNOSIS — D500 Iron deficiency anemia secondary to blood loss (chronic): Secondary | ICD-10-CM | POA: Diagnosis not present

## 2021-03-12 NOTE — Patient Instructions (Signed)
Medication Instructions:  Your physician recommends that you continue on your current medications as directed. Please refer to the Current Medication list given to you today.  *If you need a refill on your cardiac medications before your next appointment, please call your pharmacy*   Lab Work: Your physician recommends that you return for lab work in: TODAY CMP, Lipids, CBC, ProBNP If you have labs (blood work) drawn today and your tests are completely normal, you will receive your results only by: [place] (if you have MyChart) OR A paper copy in the mail If you have any lab test that is abnormal or we need to change your treatment, we will call you to review the results.   Testing/Procedures: None   Follow-Up: At [facility], you and your health needs are our priority.  As part of our continuing mission to provide you with exceptional heart care, we have created designated Provider Care Teams.  These Care Teams include your primary Cardiologist (physician) and Advanced Practice Providers (APPs -  Physician Assistants and Nurse Practitioners) who all work together to provide you with the care you need, when you need it.  We recommend signing up for the patient portal called "MyChart".  Sign up information is provided on this After Visit Summary.  MyChart is used to connect with patients for Virtual Visits (Telemedicine).  Patients are able to view lab/test results, encounter notes, upcoming appointments, etc.  Non-urgent messages can be sent to your provider as well.   To learn more about what you can do with MyChart, go to [email or web address].    Your next appointment:   6 month(s)  The format for your next appointment:   In Person  Provider:   Shirlee More, MD    Other Instructions  KNOW Reading (Your Goal):  No shortness of breath or trouble bleeding No weight gain of more than 3 pounds in one day or 5 pounds in a week No  swelling in your ankles, feet, stomach, or hands No chest discomfort, heaviness or pain  Yellow Zone (Call the Doctor to get help!): Having 1 or more of the following: Weight gain of 3 pounds in 1 day or 5 pounds in 1 week More swelling of your feet, ankles, stomach, or hands It is harder to breath when lying down. You need to sit up Chest discomfort, heaviness, or pain You feel more tired or have less energy than normal New or worsening dizziness Dry hacking cough You feel uneasy and you know something is not right  Red Zone (Call 911--EMERGENCY): Struggling to breathe. This does not go away when you sit up. Stronger and more regular amounts of chest discomfort New confusion or can't think clearly Fainting or near fainting   Resources form the American heart Association: RetroDivas.ch.jsp

## 2021-03-13 ENCOUNTER — Telehealth: Payer: Self-pay

## 2021-03-13 LAB — LIPID PANEL
Chol/HDL Ratio: 3.8 ratio (ref 0.0–5.0)
Cholesterol, Total: 184 mg/dL (ref 100–199)
HDL: 48 mg/dL (ref >39)
LDL Chol Calc (NIH): 84 mg/dL (ref 0–99)
Triglycerides: 318 mg/dL — ABNORMAL HIGH (ref 0–149)
VLDL Cholesterol Cal: 52 mg/dL — ABNORMAL HIGH (ref 5–40)

## 2021-03-13 LAB — COMPREHENSIVE METABOLIC PANEL
ALT: 16 IU/L (ref 0–44)
AST: 15 IU/L (ref 0–40)
Albumin/Globulin Ratio: 1.8 (ref 1.2–2.2)
Albumin: 4.5 g/dL (ref 3.7–4.7)
Alkaline Phosphatase: 48 IU/L (ref 44–121)
BUN/Creatinine Ratio: 22 (ref 10–24)
BUN: 26 mg/dL (ref 8–27)
Bilirubin Total: 0.4 mg/dL (ref 0.0–1.2)
CO2: 27 mmol/L (ref 20–29)
Calcium: 9.4 mg/dL (ref 8.6–10.2)
Chloride: 101 mmol/L (ref 96–106)
Creatinine, Ser: 1.16 mg/dL (ref 0.76–1.27)
Globulin, Total: 2.5 g/dL (ref 1.5–4.5)
Glucose: 176 mg/dL — ABNORMAL HIGH (ref 70–99)
Potassium: 4.6 mmol/L (ref 3.5–5.2)
Sodium: 142 mmol/L (ref 134–144)
Total Protein: 7 g/dL (ref 6.0–8.5)
eGFR: 64 mL/min/{1.73_m2} (ref >59)

## 2021-03-13 LAB — CBC
Hematocrit: 47 % (ref 37.5–51.0)
Hemoglobin: 15.5 g/dL (ref 13.0–17.7)
MCH: 30.9 pg (ref 26.6–33.0)
MCHC: 33 g/dL (ref 31.5–35.7)
MCV: 94 fL (ref 79–97)
Platelets: 201 10*3/uL (ref 150–450)
RBC: 5.01 x10E6/uL (ref 4.14–5.80)
RDW: 14.1 % (ref 11.6–15.4)
WBC: 6.3 10*3/uL (ref 3.4–10.8)

## 2021-03-13 LAB — PRO B NATRIURETIC PEPTIDE: NT-Pro BNP: 511 pg/mL — ABNORMAL HIGH (ref 0–486)

## 2021-03-13 NOTE — Telephone Encounter (Signed)
-----   Message from Richardo Priest, MD sent at 03/13/2021  7:38 AM EST ----- Normal or stable result no changes

## 2021-03-13 NOTE — Telephone Encounter (Signed)
Spoke with patient regarding results and recommendation.  Patient verbalizes understanding and is agreeable to plan of care. Advised patient to call back with any issues or concerns.  

## 2021-03-14 DIAGNOSIS — E1169 Type 2 diabetes mellitus with other specified complication: Secondary | ICD-10-CM | POA: Diagnosis not present

## 2021-03-14 DIAGNOSIS — I4891 Unspecified atrial fibrillation: Secondary | ICD-10-CM | POA: Diagnosis not present

## 2021-03-14 DIAGNOSIS — Z79899 Other long term (current) drug therapy: Secondary | ICD-10-CM | POA: Diagnosis not present

## 2021-03-14 DIAGNOSIS — N400 Enlarged prostate without lower urinary tract symptoms: Secondary | ICD-10-CM | POA: Diagnosis not present

## 2021-03-14 DIAGNOSIS — Z125 Encounter for screening for malignant neoplasm of prostate: Secondary | ICD-10-CM | POA: Diagnosis not present

## 2021-03-14 DIAGNOSIS — G8929 Other chronic pain: Secondary | ICD-10-CM | POA: Diagnosis not present

## 2021-03-14 DIAGNOSIS — G4700 Insomnia, unspecified: Secondary | ICD-10-CM | POA: Diagnosis not present

## 2021-03-14 DIAGNOSIS — E7800 Pure hypercholesterolemia, unspecified: Secondary | ICD-10-CM | POA: Diagnosis not present

## 2021-03-14 DIAGNOSIS — K219 Gastro-esophageal reflux disease without esophagitis: Secondary | ICD-10-CM | POA: Diagnosis not present

## 2021-03-14 DIAGNOSIS — M549 Dorsalgia, unspecified: Secondary | ICD-10-CM | POA: Diagnosis not present

## 2021-03-14 DIAGNOSIS — Z9181 History of falling: Secondary | ICD-10-CM | POA: Diagnosis not present

## 2021-03-14 DIAGNOSIS — Z1331 Encounter for screening for depression: Secondary | ICD-10-CM | POA: Diagnosis not present

## 2021-03-14 DIAGNOSIS — I25118 Atherosclerotic heart disease of native coronary artery with other forms of angina pectoris: Secondary | ICD-10-CM | POA: Diagnosis not present

## 2021-04-26 DIAGNOSIS — M13812 Other specified arthritis, left shoulder: Secondary | ICD-10-CM | POA: Diagnosis not present

## 2021-04-26 DIAGNOSIS — M7542 Impingement syndrome of left shoulder: Secondary | ICD-10-CM | POA: Insufficient documentation

## 2021-04-26 DIAGNOSIS — M25512 Pain in left shoulder: Secondary | ICD-10-CM | POA: Diagnosis not present

## 2021-04-26 HISTORY — DX: Impingement syndrome of left shoulder: M75.42

## 2021-05-07 DIAGNOSIS — L570 Actinic keratosis: Secondary | ICD-10-CM | POA: Diagnosis not present

## 2021-05-07 DIAGNOSIS — L821 Other seborrheic keratosis: Secondary | ICD-10-CM | POA: Diagnosis not present

## 2021-05-07 DIAGNOSIS — L820 Inflamed seborrheic keratosis: Secondary | ICD-10-CM | POA: Diagnosis not present

## 2021-05-07 DIAGNOSIS — L578 Other skin changes due to chronic exposure to nonionizing radiation: Secondary | ICD-10-CM | POA: Diagnosis not present

## 2021-06-11 ENCOUNTER — Other Ambulatory Visit: Payer: Self-pay | Admitting: Cardiology

## 2021-06-19 DIAGNOSIS — I10 Essential (primary) hypertension: Secondary | ICD-10-CM | POA: Diagnosis not present

## 2021-06-19 DIAGNOSIS — E1169 Type 2 diabetes mellitus with other specified complication: Secondary | ICD-10-CM | POA: Diagnosis not present

## 2021-06-19 DIAGNOSIS — M7542 Impingement syndrome of left shoulder: Secondary | ICD-10-CM | POA: Diagnosis not present

## 2021-06-19 DIAGNOSIS — M25512 Pain in left shoulder: Secondary | ICD-10-CM | POA: Diagnosis not present

## 2021-07-02 ENCOUNTER — Telehealth: Payer: Self-pay | Admitting: Cardiology

## 2021-07-02 NOTE — Telephone Encounter (Signed)
?*  STAT* If patient is at the pharmacy, call can be transferred to refill team. ? ? ?1. Which medications need to be refilled? (please list name of each medication and dose if known)  ?digoxin (LANOXIN) 0.25 MG tablet ?esomeprazole (NEXIUM) 40 MG capsule ?ezetimibe (ZETIA) 10 MG tablet ?furosemide (LASIX) 80 MG tablet ?gemfibrozil (LOPID) 600 MG tablet ?isosorbide mononitrate (IMDUR) 60 MG 24 hr tablet ?KLOR-CON M20 20 MEQ tablet ?metoprolol succinate (TOPROL-XL) 25 MG 24 hr tablet ?nitroGLYCERIN (NITROSTAT) 0.4 MG SL tablet ?XARELTO 20 MG TABS tablet ? ?2. Which pharmacy/location (including street and city if local pharmacy) is medication to be sent to? ?[place], [place] - [address] AT [address] ? ?3. Do they need a 30 day or 90 day supply? 90 with refills  ? ? ?Patient said his pharmacy changed after his insurance changed  ?

## 2021-07-03 MED ORDER — RIVAROXABAN 20 MG PO TABS: 20.0000 mg | ORAL_TABLET | Freq: Every day | ORAL | 1 refills | Status: DC

## 2021-07-03 MED ORDER — DIGOXIN 250 MCG PO TABS: 0.2500 mg | ORAL_TABLET | Freq: Every day | ORAL | 1 refills | Status: DC

## 2021-07-03 MED ORDER — GEMFIBROZIL 600 MG PO TABS: 300.0000 mg | ORAL_TABLET | Freq: Two times a day (BID) | ORAL | 1 refills | Status: DC

## 2021-07-03 MED ORDER — METOPROLOL SUCCINATE ER 25 MG PO TB24: 25.0000 mg | ORAL_TABLET | Freq: Two times a day (BID) | ORAL | 1 refills | Status: DC

## 2021-07-03 MED ORDER — ISOSORBIDE MONONITRATE ER 60 MG PO TB24: 60.0000 mg | ORAL_TABLET | Freq: Every day | ORAL | 1 refills | Status: DC

## 2021-07-03 MED ORDER — ESOMEPRAZOLE MAGNESIUM 40 MG PO CPDR: 40.0000 mg | DELAYED_RELEASE_CAPSULE | Freq: Every day | ORAL | 1 refills | Status: DC

## 2021-07-03 MED ORDER — NITROGLYCERIN 0.4 MG SL SUBL: 0.4000 mg | SUBLINGUAL_TABLET | SUBLINGUAL | 7 refills | Status: DC | PRN

## 2021-07-03 MED ORDER — POTASSIUM CHLORIDE CRYS ER 20 MEQ PO TBCR: EXTENDED_RELEASE_TABLET | ORAL | 1 refills | Status: DC

## 2021-07-03 MED ORDER — FUROSEMIDE 80 MG PO TABS: ORAL_TABLET | ORAL | 1 refills | Status: DC

## 2021-07-03 MED ORDER — EZETIMIBE 10 MG PO TABS: 10.0000 mg | ORAL_TABLET | Freq: Every day | ORAL | 1 refills | Status: DC

## 2021-07-03 NOTE — Telephone Encounter (Signed)
Refills of digoxin (LANOXIN) 0.25 MG tablet ?esomeprazole (NEXIUM) 40 MG capsule ?ezetimibe (ZETIA) 10 MG tablet ?furosemide (LASIX) 80 MG tablet ?gemfibrozil (LOPID) 600 MG tablet ?isosorbide mononitrate (IMDUR) 60 MG 24 hr tablet ?KLOR-CON M20 20 MEQ tablet ?metoprolol succinate (TOPROL-XL) 25 MG 24 hr tablet ?nitroGLYCERIN (NITROSTAT) 0.4 MG SL tablet ?XARELTO 20 MG TABS tablet  ? ?Sent to [facility]. ?

## 2021-07-19 ENCOUNTER — Telehealth: Payer: Self-pay | Admitting: Cardiology

## 2021-07-19 NOTE — Telephone Encounter (Signed)
Pt c/o Shortness Of Breath: STAT if SOB developed within the last 24 hours or pt is noticeably SOB on the phone ? ?1. Are you currently SOB (can you hear that pt is SOB on the phone)? Not at this time ? ?2. How long have you been experiencing SOB?about a week or more ? ?3. Are you SOB when sitting or when up moving around? Most when he exert himself ? ?4. Are you currently experiencing any other symptoms? Tightness in chest some times, sweat when this happen, he weats so much, his clothes get wet  ? ?

## 2021-07-19 NOTE — Telephone Encounter (Signed)
Called patient due to the symptoms he was experiencing. Patient was sob on the phone and he reported being sob for about a week. He also reported that he gets SOB when he exerts himself. He also reported feeling chest tightness and chest pressure. He also sweats excessively per the patient. Based on him having chest pressure and chest tightness I recommended that he go to the ER to be evaluated. Patient agreed and had no further questions at this time. ? ? ?

## 2021-07-23 ENCOUNTER — Telehealth: Payer: Self-pay | Admitting: Cardiology

## 2021-07-23 NOTE — Telephone Encounter (Signed)
Spoke with pt and made him an appointment to see Dr. Bettina Gavia. Pt states that he has been having increased shortness of breath with exertion, diaphoresis and chest pressure for 2 weeks. Pt has not used a NTG bur has been advised to use NTG and got to the ED for recurrence oc sx. Pt verbalized understanding and had no additional questions. ?

## 2021-07-23 NOTE — Telephone Encounter (Signed)
Pt has been complaining since Friday of SOB... had patient care manager to reach out on his behalf... pt would like to speak with a nurse in regards to this... please advise  ? ?

## 2021-07-29 NOTE — Progress Notes (Signed)
?Cardiology Office Note:   ? ?Date:  07/30/2021  ? ?ID:  Andrew Montane., DOB 1940/09/23, MRN [id number] ? ?PCP:  Cyndi Bender, PA-C  ?Cardiologist:  Shirlee More, MD   ? ?Referring MD: Cyndi Bender, PA-C  ? ? ?ASSESSMENT:   ? ?1. Coronary artery disease involving native coronary artery of native heart with angina pectoris ([place])   ?2. Persistent atrial fibrillation ([place])   ?3. High risk medication use   ?4. Chronic anticoagulation   ?5. Hypertensive heart disease with heart failure ([place])   ?6. Mixed hyperlipidemia   ? ?PLAN:   ? ?In order of problems listed above: ? ?He has developed a pattern of limiting angina despite medical therapy and previous PCI and after discussion of benefits risks and options were elected to undergo coronary angiography is on the schedule as an outpatient.  His symptoms seem to be very typical angina he can tolerate his antithrombotic therapy and has had no recurrent GI bleeding.  He has mild renal insufficiency we will recheck his labs and give him IV fluids at the time of his procedure. ?Stable rate is controlled he is on low-dose digoxin check level today along with his beta-blocker and his anticoagulant. ?Stable heart failure he has no fluid overload continue his current loop diuretic furosemide 80 in the morning 40 in the evening and antihypertensives including beta-blocker ?Continue his statin lipids are at target ? ? ?Next appointment: 6 to 8 weeks ? ? ?Medication Adjustments/Labs and Tests Ordered: ?Current medicines are reviewed at length with the patient today.  Concerns regarding medicines are outlined above.  ?No orders of the defined types were placed in this encounter. ? ?No orders of the defined types were placed in this encounter. ? ? ?Chief Complaint  ?Patient presents with  ? Chest Pain  ? ? ?History of Present Illness:   ? ?Andrew Montane. is a 81 y.o. 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  He was last seen 03/12/2021.  At his last visit his atrial fibrillation was rate controlled with low-dose digoxin and beta-blocker and he was anticoagulated.  CAD with stable [place] Heart Association class I blood pressure is at target and he had no evidence of fluid overload.  He is seen today as a work in finding our office last week that he was having increasing shortness of breath and has had chest discomfort but has not needed to use nitroglycerin. ? ?Compliance with diet, lifestyle and medications: Yes ? ?He is preparing for farm season. ?He does not feel well he has exercise intolerance exertional shortness of breath when he goes out to work he also gets fullness pressure substernal relieved with rest has not needed nitroglycerin.  These episodes occurring frequently and interfering with his ability to function in his career.  Fortunately he is not having rest or nocturnal episodes.  He also gets this sensation when he bends over and uses his upper extremities.  He has had no recurrent GI bleeding he has been diaphoretic at times no fever chills no edema orthopnea palpitation or syncope. ? ?His last coronary angiography 04/23/2016 showed severe stenosis in the LAD and diagonal bifurcation with PCI and stent of the LAD and PCI of the diagonal branch.  Ejection fraction was 55% with normal left ventricular function.  Previous intervention  includes drug-eluting stent to left circumflex coronary artery for unstable angina 10/17/2014. ?Past Medical History:  ?Diagnosis Date  ? Anxiety   ? Arthritis   ? Atypical atrial flutter ([place]) 12/14/2014  ? Bradycardia, sinus 10/15/2014  ? Burning sensation of feet 01/17/2016  ? CAD, multiple vessel 10/18/2014  ? Overview:  DES to [facility] for unstable angina 10/17/14 Diagnostic Summary Severe stenosis of mid Cx likely cause of recent  accelerating angina Diffuse severe stenosis of small caliber LAD unchanged from 2012 Normal LV function Paroxysmal SVT noted during procedure 04/23/16:  PCI / Resolute Drug Eluting Stent of the proximal Left Anterior Descending Coronary Artery. Successful PTCA of the ostial 1st Diagonal Coronary Artery  ? Chronic left-sided low back pain with sciatica 02/21/2016  ? Coronary artery disease 2003  ? s/p MI and numerous stents  ? Coronary artery disease involving native coronary artery of native heart with angina pectoris ([place]) 10/18/2014  ? Overview:  DES to [facility] for unstable angina 10/17/14 Diagnostic Summary Severe stenosis of mid Cx likely cause of recent accelerating angina Diffuse severe stenosis of small caliber LAD unchanged from 2012 Normal LV function Paroxysmal SVT noted during procedure 04/23/16:  PCI / Resolute Drug Eluting Stent of the proximal Left Anterior Descending Coronary Artery. Successful PTCA of the ostial 1st Di  ? Degeneration of lumbar intervertebral disc 05/02/2017  ? Degenerative spondylolisthesis 05/02/2017  ? Depression   ? Enlarged prostate without lower urinary tract symptoms (luts) 2012  ? frequent urination and nocturia; sees dr [name] Late in [place]  ? Essential hypertension 10/15/2014  ? Gastroesophageal reflux disease without esophagitis 04/21/2016  ? GERD (gastroesophageal reflux disease)   ? Hyperlipidemia   ? Hypertension   ? Hypertensive heart disease with heart failure ([place]) 12/20/2016  ? IHD (ischemic heart disease) 10/15/2014  ? Lower limb pain, inferior, right 12/17/2017  ? Lumbar radiculopathy 02/21/2016  ? Myocardial infarction [facility]) 2003  ? NSTEMI (non-ST elevated myocardial infarction) ([place]) 04/24/2016  ? Overview:  LHC 04/24/15: Severe stenosis of the LAD & Diagonal bifurcation  LV ejection fraction is 55-60 %  Interventional Summary Successful Angiosculpt PCI / Resolute Drug Eluting Stent of the proximal LAD Successful PTCA of the ostial 1st Diagonal Coronary Artery  ? Paroxysmal SVT  (supraventricular tachycardia) ([place]) 11/16/2014  ? Precordial pain 10/15/2014  ? PVC's (premature ventricular contractions) 04/22/2016  ? RBBB 10/15/2014  ? S/P lumbar fusion 04/24/2017  ? Sacroiliac joint pain 09/10/2018  ? Sleep disturbances 01/17/2016  ? Snoring 01/17/2016  ? Somatic dysfunction of left sacroiliac joint 06/19/2018  ? Subscapularis (muscle) sprain 03/29/2011  ? 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 ? ?Past Surgical History:  ?Procedure Laterality Date  ? BIOPSY  04/24/2020  ? Procedure: BIOPSY;  Surgeon: Jerene Bears, MD;  Location: [facility] ENDOSCOPY;  Service: Gastroenterology;;  ? CARDIAC CATHETERIZATION  [phone]  ? [place]ca/[place] in [place]  ? CARDIAC CATHETERIZATION  04/23/2016  ? COLONOSCOPY WITH PROPOFOL N/A 04/24/2020  ? Procedure: COLONOSCOPY WITH PROPOFOL;  Surgeon: Jerene Bears, MD;  Location: [place];  Service: Gastroenterology;  Laterality: N/A;  ? CORONARY ANGIOPLASTY    ? ESOPHAGOGASTRODUODENOSCOPY (EGD) WITH PROPOFOL N/A 04/24/2020  ? Procedure: ESOPHAGOGASTRODUODENOSCOPY (EGD) WITH PROPOFOL;  Surgeon: Jerene Bears, MD;  Location[facility] END[facility]COPY;  Service: Gastroenterology;  Laterality: N/A;  ? EYE SURGERY  05-1010  ? bil cataract ,iol  ? FRACTURE SURGERY    ? HEMOSTASIS CONTROL  04/24/2020  ? Procedure: HEMOSTASIS CONTROL;  Surgeon: Jerene Bears,  MD;  Location: [place];  Service: Gastroenterology;;  ? I & D EXTREMITY Right 02/23/2015  ? Procedure: RIGHT RING FINGER REVISION AMPUTATION;  Surgeon: Leanora Cover, MD;  Location: [place];  Service: Orthopedics;  Laterality: Right;  ? KNEE ARTHROSCOPY W/ MENISCAL REPAIR  2000  ? left knee  ? LUMBAR EPIDURAL INJECTION  2011  ? low back pain; DDD  ? POLYPECTOMY  04/24/2020  ? Procedure: POLYPECTOMY;  Surgeon: Jerene Bears, MD;  Location: [facility] ENDOSCOPY;  Service: Gastroenterology;;  ? ring finger tramatic qamputation Right   ? SHOULDER ARTHROSCOPY  03/29/2011  ? Procedure: ARTHROSCOPY SHOULDER;  Surgeon: Augustin Schooling;  Location: [place];   Service: Orthopedics;  Laterality: Right;  RIGHT SHOULDER ARTHROSCOPY WITH OPEN SUBSCAPULAR REPAIR  ? SHOULDER OPEN ROTATOR CUFF REPAIR    ? 3 on rt shoulder; 2 on left  ? TRANSFORAMINAL LUMBAR INTERBODY FUSION (

## 2021-07-29 NOTE — H&P (View-Only) (Signed)
?Cardiology Office Note:   ? ?Date:  07/30/2021  ? ?ID:  Andrew Hardy., DOB November 04, 1940, MRN [id number] ? ?PCP:  Cyndi Bender, PA-C  ?Cardiologist:  Shirlee More, MD   ? ?Referring MD: Cyndi Bender, PA-C  ? ? ?ASSESSMENT:   ? ?1. Coronary artery disease involving native coronary artery of native heart with angina pectoris ([place])   ?2. Persistent atrial fibrillation ([place])   ?3. High risk medication use   ?4. Chronic anticoagulation   ?5. Hypertensive heart disease with heart failure ([place])   ?6. Mixed hyperlipidemia   ? ?PLAN:   ? ?In order of problems listed above: ? ?He has developed a pattern of limiting angina despite medical therapy and previous PCI and after discussion of benefits risks and options were elected to undergo coronary angiography is on the schedule as an outpatient.  His symptoms seem to be very typical angina he can tolerate his antithrombotic therapy and has had no recurrent GI bleeding.  He has mild renal insufficiency we will recheck his labs and give him IV fluids at the time of his procedure. ?Stable rate is controlled he is on low-dose digoxin check level today along with his beta-blocker and his anticoagulant. ?Stable heart failure he has no fluid overload continue his current loop diuretic furosemide 80 in the morning 40 in the evening and antihypertensives including beta-blocker ?Continue his statin lipids are at target ? ? ?Next appointment: 6 to 8 weeks ? ? ?Medication Adjustments/Labs and Tests Ordered: ?Current medicines are reviewed at length with the patient today.  Concerns regarding medicines are outlined above.  ?No orders of the defined types were placed in this encounter. ? ?No orders of the defined types were placed in this encounter. ? ? ?Chief Complaint  ?Patient presents with  ? Chest Pain  ? ? ?History of Present Illness:   ? ?Andrew Hardy. is a 81 y.o. 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  He was last seen 03/12/2021.  At his last visit his atrial fibrillation was rate controlled with low-dose digoxin and beta-blocker and he was anticoagulated.  CAD with stable [place] Heart Association class I blood pressure is at target and he had no evidence of fluid overload.  He is seen today as a work in finding our office last week that he was having increasing shortness of breath and has had chest discomfort but has not needed to use nitroglycerin. ? ?Compliance with diet, lifestyle and medications: Yes ? ?He is preparing for farm season. ?He does not feel well he has exercise intolerance exertional shortness of breath when he goes out to work he also gets fullness pressure substernal relieved with rest has not needed nitroglycerin.  These episodes occurring frequently and interfering with his ability to function in his career.  Fortunately he is not having rest or nocturnal episodes.  He also gets this sensation when he bends over and uses his upper extremities.  He has had no recurrent GI bleeding he has been diaphoretic at times no fever chills no edema orthopnea palpitation or syncope. ? ?His last coronary angiography 04/23/2016 showed severe stenosis in the LAD and diagonal bifurcation with PCI and stent of the LAD and PCI of the diagonal branch.  Ejection fraction was 55% with normal left ventricular function.  Previous intervention  includes drug-eluting stent to left circumflex coronary artery for unstable angina 10/17/2014. ?Past Medical History:  ?Diagnosis Date  ? Anxiety   ? Arthritis   ? Atypical atrial flutter ([place]) 12/14/2014  ? Bradycardia, sinus 10/15/2014  ? Burning sensation of feet 01/17/2016  ? CAD, multiple vessel 10/18/2014  ? Overview:  DES to [facility] for unstable angina 10/17/14 Diagnostic Summary Severe stenosis of mid Cx likely cause of recent  accelerating angina Diffuse severe stenosis of small caliber LAD unchanged from 2012 Normal LV function Paroxysmal SVT noted during procedure 04/23/16:  PCI / Resolute Drug Eluting Stent of the proximal Left Anterior Descending Coronary Artery. Successful PTCA of the ostial 1st Diagonal Coronary Artery  ? Chronic left-sided low back pain with sciatica 02/21/2016  ? Coronary artery disease 2003  ? s/p MI and numerous stents  ? Coronary artery disease involving native coronary artery of native heart with angina pectoris ([place]) 10/18/2014  ? Overview:  DES to [facility] for unstable angina 10/17/14 Diagnostic Summary Severe stenosis of mid Cx likely cause of recent accelerating angina Diffuse severe stenosis of small caliber LAD unchanged from 2012 Normal LV function Paroxysmal SVT noted during procedure 04/23/16:  PCI / Resolute Drug Eluting Stent of the proximal Left Anterior Descending Coronary Artery. Successful PTCA of the ostial 1st Di  ? Degeneration of lumbar intervertebral disc 05/02/2017  ? Degenerative spondylolisthesis 05/02/2017  ? Depression   ? Enlarged prostate without lower urinary tract symptoms (luts) 2012  ? frequent urination and nocturia; sees dr [name] Late in [place]  ? Essential hypertension 10/15/2014  ? Gastroesophageal reflux disease without esophagitis 04/21/2016  ? GERD (gastroesophageal reflux disease)   ? Hyperlipidemia   ? Hypertension   ? Hypertensive heart disease with heart failure ([place]) 12/20/2016  ? IHD (ischemic heart disease) 10/15/2014  ? Lower limb pain, inferior, right 12/17/2017  ? Lumbar radiculopathy 02/21/2016  ? Myocardial infarction [facility]) 2003  ? NSTEMI (non-ST elevated myocardial infarction) ([place]) 04/24/2016  ? Overview:  LHC 04/24/15: Severe stenosis of the LAD & Diagonal bifurcation  LV ejection fraction is 55-60 %  Interventional Summary Successful Angiosculpt PCI / Resolute Drug Eluting Stent of the proximal LAD Successful PTCA of the ostial 1st Diagonal Coronary Artery  ? Paroxysmal SVT  (supraventricular tachycardia) ([place]) 11/16/2014  ? Precordial pain 10/15/2014  ? PVC's (premature ventricular contractions) 04/22/2016  ? RBBB 10/15/2014  ? S/P lumbar fusion 04/24/2017  ? Sacroiliac joint pain 09/10/2018  ? Sleep disturbances 01/17/2016  ? Snoring 01/17/2016  ? Somatic dysfunction of left sacroiliac joint 06/19/2018  ? Subscapularis (muscle) sprain 03/29/2011  ? 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 ? ?Past Surgical History:  ?Procedure Laterality Date  ? BIOPSY  04/24/2020  ? Procedure: BIOPSY;  Surgeon: Jerene Bears, MD;  Location: [facility] ENDOSCOPY;  Service: Gastroenterology;;  ? CARDIAC CATHETERIZATION  [phone]  ? [place]ca/[place] in [place]  ? CARDIAC CATHETERIZATION  04/23/2016  ? COLONOSCOPY WITH PROPOFOL N/A 04/24/2020  ? Procedure: COLONOSCOPY WITH PROPOFOL;  Surgeon: Jerene Bears, MD;  Location: [place];  Service: Gastroenterology;  Laterality: N/A;  ? CORONARY ANGIOPLASTY    ? ESOPHAGOGASTRODUODENOSCOPY (EGD) WITH PROPOFOL N/A 04/24/2020  ? Procedure: ESOPHAGOGASTRODUODENOSCOPY (EGD) WITH PROPOFOL;  Surgeon: Jerene Bears, MD;  Location: [facility] ENDOSCOPY;  Service: Gastroenterology;  Laterality: N/A;  ? EYE SURGERY  05-1010  ? bil cataract ,iol  ? FRACTURE SURGERY    ? HEMOSTASIS CONTROL  04/24/2020  ? Procedure: HEMOSTASIS CONTROL;  Surgeon: Jerene Bears,  MD;  Location: [place];  Service: Gastroenterology;;  ? I & D EXTREMITY Right 02/23/2015  ? Procedure: RIGHT RING FINGER REVISION AMPUTATION;  Surgeon: Leanora Cover, MD;  Location: [place];  Service: Orthopedics;  Laterality: Right;  ? KNEE ARTHROSCOPY W/ MENISCAL REPAIR  2000  ? left knee  ? LUMBAR EPIDURAL INJECTION  2011  ? low back pain; DDD  ? POLYPECTOMY  04/24/2020  ? Procedure: POLYPECTOMY;  Surgeon: Jerene Bears, MD;  Location: [facility] ENDOSCOPY;  Service: Gastroenterology;;  ? ring finger tramatic qamputation Right   ? SHOULDER ARTHROSCOPY  03/29/2011  ? Procedure: ARTHROSCOPY SHOULDER;  Surgeon: Augustin Schooling;  Location: [place];   Service: Orthopedics;  Laterality: Right;  RIGHT SHOULDER ARTHROSCOPY WITH OPEN SUBSCAPULAR REPAIR  ? SHOULDER OPEN ROTATOR CUFF REPAIR    ? 3 on rt shoulder; 2 on left  ? TRANSFORAMINAL LUMBAR INTERBODY FUSION (

## 2021-07-30 ENCOUNTER — Encounter: Payer: Self-pay | Admitting: Cardiology

## 2021-07-30 ENCOUNTER — Ambulatory Visit: Payer: PPO | Admitting: Cardiology

## 2021-07-30 VITALS — BP 140/90 | HR 92 | Ht 67.0 in | Wt 221.0 lb

## 2021-07-30 DIAGNOSIS — Z79899 Other long term (current) drug therapy: Secondary | ICD-10-CM

## 2021-07-30 DIAGNOSIS — E782 Mixed hyperlipidemia: Secondary | ICD-10-CM

## 2021-07-30 DIAGNOSIS — Z7901 Long term (current) use of anticoagulants: Secondary | ICD-10-CM | POA: Diagnosis not present

## 2021-07-30 DIAGNOSIS — I4819 Other persistent atrial fibrillation: Secondary | ICD-10-CM

## 2021-07-30 DIAGNOSIS — I25119 Atherosclerotic heart disease of native coronary artery with unspecified angina pectoris: Secondary | ICD-10-CM

## 2021-07-30 DIAGNOSIS — I110 Hypertensive heart disease with heart failure: Secondary | ICD-10-CM

## 2021-07-30 NOTE — Patient Instructions (Signed)
Medication Instructions:  ?Your physician recommends that you continue on your current medications as directed. Please refer to the Current Medication list given to you today. ? ?*If you need a refill on your cardiac medications before your next appointment, please call your pharmacy* ? ? ?Lab Work: ?Your physician recommends that you return for lab work in:  ? ?Labs today: BMP, CBC, Digoxin ? ?If you have labs (blood work) drawn today and your tests are completely normal, you will receive your results only by: ?MyChart Message (if you have MyChart) OR ?A paper copy in the mail ?If you have any lab test that is abnormal or we need to change your treatment, we will call you to review the results. ? ? ?Testing/Procedures: ? ?[facility] ?[place] ?[place] [id number] ?Dept: [phone] ?Loc: [phone] ? ?Idelle Jo Jr.  07/30/2021 ? ?You are scheduled for a Cardiac Catheterization on Friday, April 21 with Dr. [name]. ? ?1. Please arrive at the Main Entrance A at [facility]: [place], [place] [address] at 10:00 AM (This time is two hours before your procedure to ensure your preparation). Free valet parking service is available.  ? ?Special note: Every effort is made to have your procedure done on time. Please understand that emergencies sometimes delay scheduled procedures. ? ?2. Diet: Do not eat solid foods after midnight.  You may have clear liquids until 5 AM upon the day of the procedure. ? ?3. 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 You do not need to be fasting. ? ?4. Medication instructions in preparation for your procedure: ? ? Contrast Allergy: No ? ? ?On the morning of your procedure, take Aspirin and any morning medicines NOT listed above.  You may use sips of water. ? ?5. Plan to go home the same day, you will only stay  overnight if medically necessary. ?6. You MUST have a responsible adult to drive you home. ?7. An adult MUST be with you the first 24 hours after you arrive home. ?8. Bring a current list of your medications, and the last time and date medication taken. ?9. Bring ID and current insurance cards. ?10.Please wear clothes that are easy to get on and off and wear slip-on shoes. ? ?Thank you for allowing [place] to care for you! ?  -- [facility]  ? ? ?Follow-Up: ?At [facility], you and your health needs are our priority.  As part of our continuing mission to provide you with exceptional heart care, we have created designated Provider Care Teams.  These Care Teams include your primary Cardiologist (physician) and Advanced Practice Providers (APPs -  Physician Assistants and Nurse Practitioners) who all work together to provide you with the care you need, when you need it. ? ?We recommend signing up for the patient portal called "MyChart".  Sign up information is provided on this After Visit Summary.  MyChart is used to connect with patients for Virtual Visits (Telemedicine).  Patients are able to view lab/test results, encounter notes, upcoming appointments, etc.  Non-urgent messages can be sent to your provider as well.   ?To learn more about what you can do with MyChart, go to [email or web address].   ? ?Your next appointment:   ?8 week(s) ? ?The format for your next appointment:   ?In Person ? ?Provider:   ?  Kirk Ruths, MD  ? ? ?Other Instructions ?None ? ?Important Information About [place] ? ? ? ? ?  ?

## 2021-08-01 LAB — DIGOXIN LEVEL: Digoxin, Serum: 0.8 ng/mL (ref 0.5–0.9)

## 2021-08-01 LAB — CBC
Hematocrit: 43.7 % (ref 37.5–51.0)
Hemoglobin: 14.8 g/dL (ref 13.0–17.7)
MCH: 31.4 pg (ref 26.6–33.0)
MCHC: 33.9 g/dL (ref 31.5–35.7)
MCV: 93 fL (ref 79–97)
Platelets: 180 10*3/uL (ref 150–450)
RBC: 4.72 x10E6/uL (ref 4.14–5.80)
RDW: 12.6 % (ref 11.6–15.4)
WBC: 5.7 10*3/uL (ref 3.4–10.8)

## 2021-08-01 LAB — BASIC METABOLIC PANEL
BUN/Creatinine Ratio: 18 (ref 10–24)
BUN: 24 mg/dL (ref 8–27)
CO2: 27 mmol/L (ref 20–29)
Calcium: 9.5 mg/dL (ref 8.6–10.2)
Chloride: 100 mmol/L (ref 96–106)
Creatinine, Ser: 1.37 mg/dL — ABNORMAL HIGH (ref 0.76–1.27)
Glucose: 179 mg/dL — ABNORMAL HIGH (ref 70–99)
Potassium: 4.1 mmol/L (ref 3.5–5.2)
Sodium: 141 mmol/L (ref 134–144)
eGFR: 52 mL/min/{1.73_m2} — ABNORMAL LOW (ref >59)

## 2021-08-07 ENCOUNTER — Telehealth: Payer: Self-pay | Admitting: *Deleted

## 2021-08-07 NOTE — Telephone Encounter (Signed)
Cardiac Catheterization scheduled at [facility] for: Friday August 10, 2021 12 Noon ?Arrival time and place: [place] Entrance A at: 10 AM ? ? ?No solid food after midnight prior to cath, clear liquids until 5 AM day of procedure. ? ?Medication instructions: ?-Hold: ? Xarelto-none 08/08/21 until post procedure ? Lasix-day before and day of procedure-per protocol GFR 52 ?-Except hold medications usual morning medications can be taken with sips of water including aspirin 81 mg. ? ?Confirmed patient has responsible adult to drive home post procedure and be with patient first 24 hours after arriving home. ? ?Patient reports no new symptoms concerning for COVID-19/no exposure to COVID-19 in the past 10 days. ? ?Reviewed procedure instructions with patient.  ?

## 2021-08-10 ENCOUNTER — Encounter (HOSPITAL_COMMUNITY)
Admission: RE | Disposition: A | Discharge status: Home / Self Care | Payer: Self-pay | Source: Home / Self Care | Attending: Cardiology

## 2021-08-10 ENCOUNTER — Other Ambulatory Visit: Payer: Self-pay

## 2021-08-10 ENCOUNTER — Ambulatory Visit (HOSPITAL_COMMUNITY)
Admission: RE | Admit: 2021-08-10 | Discharge: 2021-08-11 | Disposition: A | Discharge status: Home / Self Care | Payer: PPO | Attending: Cardiology | Admitting: Cardiology

## 2021-08-10 DIAGNOSIS — I252 Old myocardial infarction: Secondary | ICD-10-CM | POA: Insufficient documentation

## 2021-08-10 DIAGNOSIS — D509 Iron deficiency anemia, unspecified: Secondary | ICD-10-CM | POA: Diagnosis not present

## 2021-08-10 DIAGNOSIS — I25110 Atherosclerotic heart disease of native coronary artery with unstable angina pectoris: Secondary | ICD-10-CM | POA: Diagnosis not present

## 2021-08-10 DIAGNOSIS — I200 Unstable angina: Secondary | ICD-10-CM | POA: Diagnosis present

## 2021-08-10 DIAGNOSIS — I4819 Other persistent atrial fibrillation: Secondary | ICD-10-CM | POA: Insufficient documentation

## 2021-08-10 DIAGNOSIS — I509 Heart failure, unspecified: Secondary | ICD-10-CM | POA: Insufficient documentation

## 2021-08-10 DIAGNOSIS — Z87891 Personal history of nicotine dependence: Secondary | ICD-10-CM | POA: Insufficient documentation

## 2021-08-10 DIAGNOSIS — Z955 Presence of coronary angioplasty implant and graft: Secondary | ICD-10-CM | POA: Diagnosis not present

## 2021-08-10 DIAGNOSIS — Z95820 Peripheral vascular angioplasty status with implants and grafts: Secondary | ICD-10-CM

## 2021-08-10 DIAGNOSIS — I110 Hypertensive heart disease with heart failure: Secondary | ICD-10-CM | POA: Insufficient documentation

## 2021-08-10 DIAGNOSIS — E782 Mixed hyperlipidemia: Secondary | ICD-10-CM | POA: Insufficient documentation

## 2021-08-10 DIAGNOSIS — I25119 Atherosclerotic heart disease of native coronary artery with unspecified angina pectoris: Secondary | ICD-10-CM

## 2021-08-10 DIAGNOSIS — Z7901 Long term (current) use of anticoagulants: Secondary | ICD-10-CM | POA: Diagnosis not present

## 2021-08-10 HISTORY — PX: LEFT HEART CATH AND CORONARY ANGIOGRAPHY: CATH118249

## 2021-08-10 HISTORY — PX: CORONARY STENT INTERVENTION: CATH118234

## 2021-08-10 HISTORY — DX: Unstable angina: I20.0

## 2021-08-10 LAB — POCT ACTIVATED CLOTTING TIME
Activated Clotting Time: 263 seconds
Activated Clotting Time: 293 seconds

## 2021-08-10 SURGERY — LEFT HEART CATH AND CORONARY ANGIOGRAPHY
Anesthesia: LOCAL

## 2021-08-10 MED ORDER — VERAPAMIL HCL 2.5 MG/ML IV SOLN
INTRAVENOUS | Status: AC
Start: 2021-08-10 — End: ?
  Filled 2021-08-10: qty 2

## 2021-08-10 MED ORDER — HEPARIN SODIUM (PORCINE) 1000 UNIT/ML IJ SOLN
INTRAMUSCULAR | Status: DC | PRN
  Administered 2021-08-10: 2000 [IU] via INTRAVENOUS
  Administered 2021-08-10 (×2): 5000 [IU] via INTRAVENOUS

## 2021-08-10 MED ORDER — SODIUM CHLORIDE 0.9 % WEIGHT BASED INFUSION
3.0000 mL/kg/h | INTRAVENOUS | Status: DC
  Administered 2021-08-10: 3 mL/kg/h via INTRAVENOUS

## 2021-08-10 MED ORDER — VITAMIN E 45 MG (100 UNIT) PO CAPS
400.0000 [IU] | ORAL_CAPSULE | Freq: Every day | ORAL | Status: DC
  Administered 2021-08-11: 400 [IU] via ORAL
  Filled 2021-08-10: qty 1
  Filled 2021-08-10: qty 4

## 2021-08-10 MED ORDER — METOPROLOL SUCCINATE ER 25 MG PO TB24
25.0000 mg | ORAL_TABLET | Freq: Two times a day (BID) | ORAL | Status: DC
  Administered 2021-08-10 – 2021-08-11 (×2): 25 mg via ORAL
  Filled 2021-08-10 (×2): qty 1

## 2021-08-10 MED ORDER — HEPARIN SODIUM (PORCINE) 1000 UNIT/ML IJ SOLN
INTRAMUSCULAR | Status: AC
  Filled 2021-08-10: qty 10

## 2021-08-10 MED ORDER — MINOCYCLINE HCL 50 MG PO CAPS
50.0000 mg | ORAL_CAPSULE | Freq: Every day | ORAL | Status: DC
  Administered 2021-08-11: 50 mg via ORAL
  Filled 2021-08-10 (×2): qty 1

## 2021-08-10 MED ORDER — HEPARIN (PORCINE) IN NACL 1000-0.9 UT/500ML-% IV SOLN
INTRAVENOUS | Status: DC | PRN
Start: 2021-08-10 — End: 2021-08-10
  Administered 2021-08-10 (×2): 500 mL

## 2021-08-10 MED ORDER — FAMOTIDINE IN NACL 20-0.9 MG/50ML-% IV SOLN
INTRAVENOUS | Status: AC
Start: 2021-08-10 — End: ?
  Filled 2021-08-10: qty 50

## 2021-08-10 MED ORDER — EZETIMIBE 10 MG PO TABS
10.0000 mg | ORAL_TABLET | Freq: Every day | ORAL | Status: DC
  Administered 2021-08-10: 10 mg via ORAL
  Filled 2021-08-10: qty 1

## 2021-08-10 MED ORDER — ASPIRIN 325 MG PO TABS: 325.0000 mg | ORAL_TABLET | Freq: Once | ORAL | Status: DC

## 2021-08-10 MED ORDER — ACETAMINOPHEN 325 MG PO TABS
650.0000 mg | ORAL_TABLET | Freq: Four times a day (QID) | ORAL | Status: DC | PRN
  Administered 2021-08-10: 650 mg via ORAL
  Filled 2021-08-10: qty 2

## 2021-08-10 MED ORDER — NITROGLYCERIN 1 MG/10 ML FOR IR/CATH LAB
INTRA_ARTERIAL | Status: DC | PRN
  Administered 2021-08-10: 200 ug via INTRACORONARY

## 2021-08-10 MED ORDER — CO-ENZYME Q-10 30 MG PO CAPS: 30.0000 mg | ORAL_CAPSULE | Freq: Every day | ORAL | Status: DC

## 2021-08-10 MED ORDER — CLOPIDOGREL BISULFATE 300 MG PO TABS
ORAL_TABLET | ORAL | Status: DC | PRN
Start: 2021-08-10 — End: 2021-08-10
  Administered 2021-08-10: 600 mg via ORAL

## 2021-08-10 MED ORDER — SIMVASTATIN 20 MG PO TABS
40.0000 mg | ORAL_TABLET | Freq: Every day | ORAL | Status: DC
  Administered 2021-08-10: 40 mg via ORAL
  Filled 2021-08-10: qty 2

## 2021-08-10 MED ORDER — CLOPIDOGREL BISULFATE 75 MG PO TABS
75.0000 mg | ORAL_TABLET | Freq: Every day | ORAL | Status: DC
  Administered 2021-08-11: 75 mg via ORAL
  Filled 2021-08-10: qty 1

## 2021-08-10 MED ORDER — TRAZODONE HCL 50 MG PO TABS
50.0000 mg | ORAL_TABLET | Freq: Every day | ORAL | Status: DC
  Administered 2021-08-10: 50 mg via ORAL
  Filled 2021-08-10: qty 1

## 2021-08-10 MED ORDER — SODIUM CHLORIDE 0.9% FLUSH
3.0000 mL | Freq: Two times a day (BID) | INTRAVENOUS | Status: DC
  Administered 2021-08-11: 3 mL via INTRAVENOUS

## 2021-08-10 MED ORDER — DULOXETINE HCL 30 MG PO CPEP
30.0000 mg | ORAL_CAPSULE | Freq: Two times a day (BID) | ORAL | Status: DC
  Administered 2021-08-10 – 2021-08-11 (×2): 30 mg via ORAL
  Filled 2021-08-10 (×2): qty 1

## 2021-08-10 MED ORDER — FENTANYL CITRATE (PF) 100 MCG/2ML IJ SOLN
INTRAMUSCULAR | Status: DC | PRN
  Administered 2021-08-10 (×2): 25 ug via INTRAVENOUS

## 2021-08-10 MED ORDER — SODIUM CHLORIDE 0.9 % IV SOLN
250.0000 mL | INTRAVENOUS | Status: DC | PRN
Start: 2021-08-11 — End: 2021-08-11

## 2021-08-10 MED ORDER — MIDAZOLAM HCL 2 MG/2ML IJ SOLN
INTRAMUSCULAR | Status: AC
  Filled 2021-08-10: qty 2

## 2021-08-10 MED ORDER — NITROGLYCERIN 0.4 MG SL SUBL
SUBLINGUAL_TABLET | SUBLINGUAL | Status: DC | PRN
Start: 2021-08-10 — End: 2021-08-10
  Administered 2021-08-10: .4 mg via SUBLINGUAL

## 2021-08-10 MED ORDER — FENTANYL CITRATE (PF) 100 MCG/2ML IJ SOLN
INTRAMUSCULAR | Status: AC
Start: 2021-08-10 — End: ?
  Filled 2021-08-10: qty 2

## 2021-08-10 MED ORDER — NITROGLYCERIN 0.4 MG SL SUBL
SUBLINGUAL_TABLET | SUBLINGUAL | Status: AC
Start: 2021-08-10 — End: 2021-08-11
  Filled 2021-08-10: qty 1

## 2021-08-10 MED ORDER — ONDANSETRON HCL 4 MG/2ML IJ SOLN: 4.0000 mg | Freq: Four times a day (QID) | INTRAMUSCULAR | Status: DC | PRN

## 2021-08-10 MED ORDER — IOHEXOL 350 MG/ML SOLN
INTRAVENOUS | Status: DC | PRN
  Administered 2021-08-10: 140 mL via INTRA_ARTERIAL

## 2021-08-10 MED ORDER — LIDOCAINE HCL (PF) 1 % IJ SOLN
INTRAMUSCULAR | Status: AC
  Filled 2021-08-10: qty 30

## 2021-08-10 MED ORDER — SODIUM CHLORIDE 0.9% FLUSH: 3.0000 mL | Freq: Two times a day (BID) | INTRAVENOUS | Status: DC

## 2021-08-10 MED ORDER — FINASTERIDE 5 MG PO TABS
5.0000 mg | ORAL_TABLET | Freq: Every day | ORAL | Status: DC
Start: 2021-08-10 — End: 2021-08-11
  Administered 2021-08-10 – 2021-08-11 (×2): 5 mg via ORAL
  Filled 2021-08-10 (×2): qty 1

## 2021-08-10 MED ORDER — HYDRALAZINE HCL 20 MG/ML IJ SOLN: 10.0000 mg | INTRAMUSCULAR | Status: AC | PRN

## 2021-08-10 MED ORDER — SODIUM CHLORIDE 0.9% FLUSH: 3.0000 mL | INTRAVENOUS | Status: DC | PRN

## 2021-08-10 MED ORDER — DIGOXIN 250 MCG PO TABS
0.2500 mg | ORAL_TABLET | Freq: Every day | ORAL | Status: DC
  Administered 2021-08-11: 0.25 mg via ORAL
  Filled 2021-08-10: qty 1

## 2021-08-10 MED ORDER — VERAPAMIL HCL 2.5 MG/ML IV SOLN
INTRAVENOUS | Status: DC | PRN
  Administered 2021-08-10: 10 mL via INTRA_ARTERIAL

## 2021-08-10 MED ORDER — FLAXSEED OIL MAX STR 1300 MG PO CAPS: 1300.0000 mg | ORAL_CAPSULE | Freq: Every day | ORAL | Status: DC

## 2021-08-10 MED ORDER — NITROGLYCERIN 1 MG/10 ML FOR IR/CATH LAB
INTRA_ARTERIAL | Status: AC
Start: 2021-08-10 — End: ?
  Filled 2021-08-10: qty 10

## 2021-08-10 MED ORDER — ASPIRIN 81 MG PO CHEW
81.0000 mg | CHEWABLE_TABLET | ORAL | Status: AC
  Administered 2021-08-10: 81 mg via ORAL
  Filled 2021-08-10: qty 1

## 2021-08-10 MED ORDER — FAMOTIDINE IN NACL 20-0.9 MG/50ML-% IV SOLN
INTRAVENOUS | Status: AC | PRN
  Administered 2021-08-10: 20 mg via INTRAVENOUS

## 2021-08-10 MED ORDER — ADULT MULTIVITAMIN W/MINERALS CH
1.0000 | ORAL_TABLET | Freq: Every day | ORAL | Status: DC
  Administered 2021-08-10 – 2021-08-11 (×2): 1 via ORAL
  Filled 2021-08-10 (×2): qty 1

## 2021-08-10 MED ORDER — LIDOCAINE HCL (PF) 1 % IJ SOLN
INTRAMUSCULAR | Status: DC | PRN
Start: 2021-08-10 — End: 2021-08-10
  Administered 2021-08-10: 2 mL via INTRADERMAL

## 2021-08-10 MED ORDER — NITROGLYCERIN 0.4 MG SL SUBL: 0.4000 mg | SUBLINGUAL_TABLET | SUBLINGUAL | Status: DC | PRN

## 2021-08-10 MED ORDER — MIDAZOLAM HCL 2 MG/2ML IJ SOLN
INTRAMUSCULAR | Status: DC | PRN
  Administered 2021-08-10 (×2): 1 mg via INTRAVENOUS

## 2021-08-10 MED ORDER — SODIUM CHLORIDE 0.9 % WEIGHT BASED INFUSION
1.0000 mL/kg/h | INTRAVENOUS | Status: AC
  Administered 2021-08-10: 1 mL/kg/h via INTRAVENOUS

## 2021-08-10 MED ORDER — TAMSULOSIN HCL 0.4 MG PO CAPS
0.4000 mg | ORAL_CAPSULE | Freq: Every day | ORAL | Status: DC
  Administered 2021-08-10 – 2021-08-11 (×2): 0.4 mg via ORAL
  Filled 2021-08-10 (×2): qty 1

## 2021-08-10 MED ORDER — GEMFIBROZIL 600 MG PO TABS
300.0000 mg | ORAL_TABLET | Freq: Two times a day (BID) | ORAL | Status: DC
Start: 2021-08-10 — End: 2021-08-11
  Administered 2021-08-10 – 2021-08-11 (×2): 300 mg via ORAL
  Filled 2021-08-10 (×3): qty 0.5

## 2021-08-10 MED ORDER — CLOPIDOGREL BISULFATE 300 MG PO TABS
ORAL_TABLET | ORAL | Status: AC
  Filled 2021-08-10: qty 2

## 2021-08-10 MED ORDER — NITROGLYCERIN 0.4 MG SL SUBL
SUBLINGUAL_TABLET | SUBLINGUAL | Status: AC
Start: 2021-08-10 — End: ?
  Filled 2021-08-10: qty 1

## 2021-08-10 MED ORDER — ISOSORBIDE MONONITRATE ER 60 MG PO TB24
60.0000 mg | ORAL_TABLET | Freq: Every day | ORAL | Status: DC
  Administered 2021-08-11: 60 mg via ORAL
  Filled 2021-08-10: qty 1

## 2021-08-10 MED ORDER — HEPARIN (PORCINE) IN NACL 1000-0.9 UT/500ML-% IV SOLN
INTRAVENOUS | Status: AC
Start: 2021-08-10 — End: ?
  Filled 2021-08-10: qty 1000

## 2021-08-10 MED ORDER — SODIUM CHLORIDE 0.9 % IV SOLN: 250.0000 mL | INTRAVENOUS | Status: DC | PRN

## 2021-08-10 MED ORDER — PANTOPRAZOLE SODIUM 40 MG PO TBEC
40.0000 mg | DELAYED_RELEASE_TABLET | Freq: Every day | ORAL | Status: DC
Start: 2021-08-11 — End: 2021-08-11
  Administered 2021-08-11: 40 mg via ORAL
  Filled 2021-08-10: qty 1

## 2021-08-10 MED ORDER — SODIUM CHLORIDE 0.9 % WEIGHT BASED INFUSION: 1.0000 mL/kg/h | INTRAVENOUS | Status: DC

## 2021-08-10 MED ORDER — RIVAROXABAN 20 MG PO TABS: 20.0000 mg | ORAL_TABLET | Freq: Every day | ORAL | Status: DC

## 2021-08-10 SURGICAL SUPPLY — 15 items
BALLN SAPPHIRE 2.25X10 (BALLOONS) ×2
BALLOON SAPPHIRE 2.25X10 (BALLOONS) IMPLANT
CATH OPTITORQUE TIG 4.0 5F (CATHETERS) ×1 IMPLANT
CATH VISTA GUIDE 6FR XB3.5 (CATHETERS) ×1 IMPLANT
DEVICE RAD COMP TR BAND LRG (VASCULAR PRODUCTS) ×1 IMPLANT
GLIDESHEATH SLEND SS 6F .021 (SHEATH) ×1 IMPLANT
GUIDEWIRE INQWIRE 1.5J.035X260 (WIRE) IMPLANT
INQWIRE 1.5J .035X260CM (WIRE) ×2
KIT ENCORE 26 ADVANTAGE (KITS) ×1 IMPLANT
KIT HEART LEFT (KITS) ×2 IMPLANT
PACK CARDIAC CATHETERIZATION (CUSTOM PROCEDURE TRAY) ×2 IMPLANT
STENT ONYX FRONTIER 2.25X12 (Permanent Stent) ×2 IMPLANT
TRANSDUCER W/STOPCOCK (MISCELLANEOUS) ×2 IMPLANT
TUBING CIL FLEX 10 FLL-RA (TUBING) ×2 IMPLANT
WIRE ASAHI PROWATER 180CM (WIRE) ×1 IMPLANT

## 2021-08-10 NOTE — Interval H&P Note (Signed)
History and Physical Interval Note: ? ?08/10/2021 ?2:00 PM ? ?Andrew Hardy.  has presented today for surgery, with the diagnosis of cad with progressive angina.  The various methods of treatment have been discussed with the patient and family. After consideration of risks, benefits and other options for treatment, the patient has consented to  Procedure(s): ?LEFT HEART CATH AND CORONARY ANGIOGRAPHY (N/A)  ?PERCUTANEOUS CORONARY INTERVENTION ? ?as a surgical intervention.  The patient's history has been reviewed, patient examined, no change in status, stable for surgery.  I have reviewed the patient's chart and labs.  Questions were answered to the patient's satisfaction.   ? ?Cath Lab Visit (complete for each Cath Lab visit) ? ?Clinical Evaluation Leading to the Procedure:  ? ?ACS: No. ? ?Non-ACS:   ? ?Anginal Classification: CCS III ? ?Anti-ischemic medical therapy: Maximal Therapy (2 or more classes of medications) ? ?Non-Invasive Test Results: No non-invasive testing performed ? ?Prior CABG: No previous CABG ? ? ? ? ?Andrew Hardy ? ? ?

## 2021-08-10 NOTE — Interval H&P Note (Signed)
History and Physical Interval Note: ? ?08/10/2021 ?2:11 PM ? ?Andrew Hardy.  has presented today for surgery, with the diagnosis of cad- Class III & IV Angina.  The various methods of treatment have been discussed with the patient and family. After consideration of risks, benefits and other options for treatment, the patient has consented to  Procedure(s): ?LEFT HEART CATH AND CORONARY ANGIOGRAPHY (N/A)  ?PERCUTANEOUS CORONARY INTERVENTION ? ?as a surgical intervention.  The patient's history has been reviewed, patient examined, no change in status, stable for surgery.  I have reviewed the patient's chart and labs.  Questions were answered to the patient's satisfaction.   ? ?Cath Lab Visit (complete for each Cath Lab visit) ? ?Clinical Evaluation Leading to the Procedure:  ? ?ACS: No. ? ?Non-ACS:   ? ?Anginal Classification: CCS III ? ?Anti-ischemic medical therapy: Maximal Therapy (2 or more classes of medications) ? ?Non-Invasive Test Results: No non-invasive testing performed ? ?Prior CABG: No previous CABG ? ? ?[name] ? ? ?

## 2021-08-11 ENCOUNTER — Encounter (HOSPITAL_COMMUNITY): Payer: Self-pay | Admitting: Cardiology

## 2021-08-11 DIAGNOSIS — I25110 Atherosclerotic heart disease of native coronary artery with unstable angina pectoris: Secondary | ICD-10-CM | POA: Diagnosis not present

## 2021-08-11 DIAGNOSIS — I200 Unstable angina: Secondary | ICD-10-CM | POA: Diagnosis not present

## 2021-08-11 DIAGNOSIS — I509 Heart failure, unspecified: Secondary | ICD-10-CM | POA: Diagnosis not present

## 2021-08-11 DIAGNOSIS — I252 Old myocardial infarction: Secondary | ICD-10-CM | POA: Diagnosis not present

## 2021-08-11 DIAGNOSIS — D509 Iron deficiency anemia, unspecified: Secondary | ICD-10-CM | POA: Diagnosis not present

## 2021-08-11 DIAGNOSIS — Z87891 Personal history of nicotine dependence: Secondary | ICD-10-CM | POA: Diagnosis not present

## 2021-08-11 DIAGNOSIS — Z95820 Peripheral vascular angioplasty status with implants and grafts: Secondary | ICD-10-CM

## 2021-08-11 DIAGNOSIS — E782 Mixed hyperlipidemia: Secondary | ICD-10-CM | POA: Diagnosis not present

## 2021-08-11 DIAGNOSIS — I110 Hypertensive heart disease with heart failure: Secondary | ICD-10-CM | POA: Diagnosis not present

## 2021-08-11 DIAGNOSIS — I4819 Other persistent atrial fibrillation: Secondary | ICD-10-CM | POA: Diagnosis not present

## 2021-08-11 DIAGNOSIS — Z955 Presence of coronary angioplasty implant and graft: Secondary | ICD-10-CM | POA: Diagnosis not present

## 2021-08-11 DIAGNOSIS — Z7901 Long term (current) use of anticoagulants: Secondary | ICD-10-CM | POA: Diagnosis not present

## 2021-08-11 HISTORY — DX: Peripheral vascular angioplasty status with implants and grafts: Z95.820

## 2021-08-11 LAB — CBC
HCT: 41.2 % (ref 39.0–52.0)
Hemoglobin: 13.4 g/dL (ref 13.0–17.0)
MCH: 31.2 pg (ref 26.0–34.0)
MCHC: 32.5 g/dL (ref 30.0–36.0)
MCV: 96 fL (ref 80.0–100.0)
Platelets: 175 10*3/uL (ref 150–400)
RBC: 4.29 MIL/uL (ref 4.22–5.81)
RDW: 13.7 % (ref 11.5–15.5)
WBC: 7 10*3/uL (ref 4.0–10.5)
nRBC: 0 % (ref 0.0–0.2)

## 2021-08-11 LAB — BASIC METABOLIC PANEL
Anion gap: 7 (ref 5–15)
BUN: 11 mg/dL (ref 8–23)
CO2: 27 mmol/L (ref 22–32)
Calcium: 9.3 mg/dL (ref 8.9–10.3)
Chloride: 106 mmol/L (ref 98–111)
Creatinine, Ser: 1.2 mg/dL (ref 0.61–1.24)
GFR, Estimated: >60 mL/min (ref >60)
Glucose, Bld: 126 mg/dL — ABNORMAL HIGH (ref 70–99)
Potassium: 4.7 mmol/L (ref 3.5–5.1)
Sodium: 140 mmol/L (ref 135–145)

## 2021-08-11 MED ORDER — PANTOPRAZOLE SODIUM 40 MG PO TBEC: 40.0000 mg | DELAYED_RELEASE_TABLET | Freq: Every day | ORAL | 6 refills | Status: AC

## 2021-08-11 MED ORDER — ACETAMINOPHEN 325 MG PO TABS: 650.0000 mg | ORAL_TABLET | Freq: Four times a day (QID) | ORAL | Status: DC | PRN

## 2021-08-11 MED ORDER — CLOPIDOGREL BISULFATE 75 MG PO TABS
75.0000 mg | ORAL_TABLET | Freq: Every day | ORAL | 11 refills | Status: DC
Start: 2021-08-12 — End: 2022-10-25

## 2021-08-11 NOTE — Discharge Summary (Addendum)
?Discharge Summary  ?  ?Patient ID: Andrew Hardy. ?MRN: [id number]; DOB: 09-Jan-1941 ? ?Admit date: 08/10/2021 ?Discharge date: 08/11/2021 ? ?PCP:  Cyndi Bender, PA-C ?  ?[facility] Providers ?Cardiologist:  Shirlee More, MD  ?Electrophysiologist:  Will Meredith Leeds, MD     ? ? ?Discharge Diagnoses  ?  ?Principal Problem: ?  Progressive angina ([place]) ?Active Problems: ?  Coronary artery disease involving native coronary artery of native heart with angina pectoris ([place]) ?  S/P angioplasty with stent 08/10/21 with DES to mid OM1 and distal LCX-OM2 ? ? ? ?Diagnostic Studies/Procedures  ?  ?Cardiac cath 08/10/21 ?  Prox RCA to Mid RCA lesion is 30% stenosed. ?  Previously placed Prox LAD to Mid LAD stent (unknown type) is  widely patent.  Jailed 1st Diag lesion is 60% stenosed. ?  Previously placed Prox Cx to Dist Cx stent (unknown type) is  widely patent. ?  Lesion #1: Dist Cx lesion is 90% stenosed. ?  A drug-eluting stent was successfully placed using a [place] 2.25X12.  Deployed at 2.35 mm and postdilated to 2.45 mm in the overlap segment. ?  Post intervention, there is a 0% residual stenosis. ?  Lesion #2: 1st Mrg lesion is 80% stenosed with 95% stenosed side branch in Lat 1st Mrg. ?  Mid LAD lesion is 55% stenosed. ?  Dist LAD lesion is 50% stenosed. ?  A drug-eluting stent was successfully placed using a STENT ONYX FRONTIER 2.25X1 -> deployed to 2.45 mm. ?  Post intervention, there is a 0% residual stenosis.  Post intervention, the side branch was reduced to 100% residual stenosis.  (Patient did have chest pain with stent placement, resolved with sublingual nitroglycerin and fentanyl/Versed.) ?  ?Multivessel CAD with 2 culprit lesions: Mid OM1 (85%) at small branch and distal LCx-OM2 (90%) just distal to prior stent.  Mild ISR in RCA stent and ostial diagonal (jailed) 60%.  Tandem 50 to 60% lesions in the mid LAD that are in hinge points.  Not likely flow-limiting. ?Successful two-vessel PCI of 2  culprit lesions reducing to 0% stenosis each treated with Onyx frontier DES 2.25 mm 12 mm stents dilated to 2.45 mm. ?Preserved LVEF with low normal roughly 50 to 55%.  Inadequate filling-therefore unable to truly estimate EF and wall motion. ?  ?Diagnostic ?Dominance: Right ?Intervention ? ? ?_____________ ?  ?History of Present Illness   ?  ?Andrew Hardy. is a 81 y.o. 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  Pt was seen by Dr. Bettina Gavia 07/30/21 with complaints of exercise intolerance, SOB, and fullness pressure substernal region relieved with rest.  Freq episodes and he is having hard time working in his career.   Prior cath in 2018 with PCI and stent of LAD and diag.  EF has been normal. He also had DES to LCX placed 2016.   ? ?Dr. Bettina Gavia recommended cath and after discussion pt was agreeable.  His HF in office was stable on diuretic.   ? ?Pt presented 08/10/21 for cardiac cath which he underwent without complications.  Results as above with successful 2 vessel PCI with DES.   ? ?Hospital Course  ?   ?Consultants: none  ? ?Today pt has been seen by cardiac rehab and he did not wish to walk and declined education.  Pt was referred to cardiac rehab but most likely will not attend.  He works on a farm. ? ?Pt has been seen by Dr. Lovena Le and found stable for discharge.  His a fib is fairly rate controlled he will continue digoxin and torpol.  He will be on Xarelto and plavix.    ? ?Did the patient have an acute coronary syndrome (MI, NSTEMI, STEMI, etc) this admission?:  No                               ?Did the patient have a percutaneous coronary intervention (stent / angioplasty)?:  Yes.   ? ? ?Cath/PCI Registry Performance & Quality Measures: ?Aspirin prescribed? - no on xarelto and plavix ?ADP  Receptor Inhibitor (Plavix/Clopidogrel, Brilinta/Ticagrelor or Effient/Prasugrel) prescribed (includes medically managed patients)? - Yes ?High Intensity Statin (Lipitor 40-'80mg'$  or Crestor 20-'40mg'$ ) prescribed? - Yes ?For EF <40%, was ACEI/ARB prescribed? - Not Applicable (EF >/= 26%) ?For EF <40%, Aldosterone Antagonist (Spironolactone or Eplerenone) prescribed? - Not Applicable (EF >/= 37%) ?Cardiac Rehab Phase II ordered? - Yes  ? ?   ? ?The patient will be scheduled for a TOC follow up appointment in 14 days.  A message has been sent to the [facility] and Scheduling Pool at the office where the patient should be seen for follow up.  ?_____________ ? ?Discharge Vitals ?Blood pressure (!) 143/81, pulse (!) 107, temperature 98.5 ?F (36.9 ?C), temperature source Oral, resp. rate 20, height '5\' 7"'$  (1.702 m), weight 101.2 kg, SpO2 96 %.  ?Filed Weights  ? 08/10/21 1000  ?Weight: 101.2 kg  ? ? ?Labs & Radiologic Studies  ?  ?CBC ?Recent Labs  ?  08/11/21 ?0307  ?WBC 7.0  ?HGB 13.4  ?HCT 41.2  ?MCV 96.0  ?PLT 175  ? ?Basic Metabolic Panel ?Recent Labs  ?  08/11/21 ?0307  ?NA 140  ?K 4.7  ?CL 106  ?CO2 27  ?GLUCOSE 126*  ?BUN 11  ?CREATININE 1.20  ?CALCIUM 9.3  ? ?Liver Function Tests ?No results for input(s): AST, ALT, ALKPHOS, BILITOT, PROT, ALBUMIN in the last 72 hours. ?No results for input(s): LIPASE, AMYLASE in the last 72 hours. ?High Sensitivity Troponin:   ?No results for input(s): TROPONINIHS in the last 720 hours.  ?BNP ?Invalid input(s): POCBNP ?D-Dimer ?No results for input(s): DDIMER in the last 72 hours. ?Hemoglobin A1C ?No results for input(s): HGBA1C in the last 72 hours. ?Fasting Lipid Panel ?No results for input(s): CHOL, HDL, LDLCALC, TRIG, CHOLHDL, LDLDIRECT in the last 72 hours. ?Thyroid Function Tests ?No results for input(s): TSH, T4TOTAL, T3FREE, THYROIDAB in the last 72 hours. ? ?Invalid input(s): FREET3 ?_____________  ?CARDIAC CATHETERIZATION ? ?Result Date: 08/10/2021 ?  Prox RCA to Mid RCA lesion  is 30% stenosed.   Previously placed Prox LAD to Mid LAD stent (unknown type) is  widely patent.  Jailed 1st Diag lesion is 60% stenosed.   Previously placed Prox Cx to Dist Cx stent (unknown type) is  widely patent.   Lesion #1: Dist Cx lesion is 90% stenosed.   A drug-eluting stent was successfully placed using a STENT ONYX FRONTIER 2.25X12.  Deployed at 2.35 mm and postdilated to 2.45 mm in the overlap segment.   Post intervention, there is a 0% residual stenosis.   Lesion #2: 1st Mrg lesion is 80% stenosed with 95% stenosed side branch in Lat 1st Mrg.   Mid LAD lesion is 55% stenosed.  Dist LAD lesion is 50% stenosed.   A drug-eluting stent was successfully placed using a STENT ONYX FRONTIER 2.25X1 -> deployed to 2.45 mm.   Post intervention, there is a 0% residual stenosis.  Post intervention, the side branch was reduced to 100% residual stenosis.  (Patient did have chest pain with stent placement, resolved with sublingual nitroglycerin and fentanyl/Versed.) Multivessel CAD with 2 culprit lesions: Mid OM1 (85%) at small branch and distal LCx-OM2 (90%) just distal to prior stent.  Mild ISR in RCA stent and ostial diagonal (jailed) 60%.  Tandem 50 to 60% lesions in the mid LAD that are in hinge points.  Not likely flow-limiting. Successful two-vessel PCI of 2 culprit lesions reducing to 0% stenosis each treated with Onyx frontier DES 2.25 mm 12 mm stents dilated to 2.45 mm. Preserved LVEF with low normal roughly 50 to 55%.  Inadequate filling-therefore unable to truly estimate EF and wall motion.   ? ?Disposition  ? ?Pt is being discharged home today in good condition. ? ?Follow-up Plans & Appointments  ? ?Call [facility] at [phone] if any bleeding, swelling or drainage at cath site.  May shower, no tub baths for 48 hours for groin sticks. No lifting over 5 pounds for 3 days.  No Driving for 3 days ? ? ?Heart Healthy Diet  ? ?Call if any issues  ? ?Stop Nexium and use protonix - the  protonix and plavix together could cause a reation ? ?Do not stop plavix this is to keep the new stents open  ? ? Follow-up Information   ? ? Richardo Priest, MD Follow up.   ?Specialties: Cardiology, Radiolo

## 2021-08-11 NOTE — Progress Notes (Signed)
? ?Progress Note ? ?Patient Name: Andrew Hardy. ?Date of Encounter: 08/11/2021 ? ?Primary Cardiologist: Shirlee More, MD  ? ?Subjective  ? ?Denies chest pain or palpitations. ? ?Inpatient Medications  ?  ?Scheduled Meds: ? clopidogrel  75 mg Oral Q breakfast  ? digoxin  0.25 mg Oral Daily  ? DULoxetine  30 mg Oral BID  ? ezetimibe  10 mg Oral q1800  ? finasteride  5 mg Oral Daily  ? gemfibrozil  300 mg Oral BID AC  ? isosorbide mononitrate  60 mg Oral Daily  ? metoprolol succinate  25 mg Oral BID  ? minocycline  50 mg Oral Daily  ? multivitamin with minerals  1 tablet Oral Daily  ? pantoprazole  40 mg Oral Daily  ? rivaroxaban  20 mg Oral Q supper  ? simvastatin  40 mg Oral q1800  ? sodium chloride flush  3 mL Intravenous Q12H  ? tamsulosin  0.4 mg Oral Daily  ? traZODone  50 mg Oral QHS  ? vitamin E  400 Units Oral Daily  ? ?Continuous Infusions: ? sodium chloride    ? ?PRN Meds: ?sodium chloride, acetaminophen, nitroGLYCERIN, ondansetron (ZOFRAN) IV, sodium chloride flush  ? ?Vital Signs  ?  ?Vitals:  ? 08/11/21 0016 08/11/21 8453 08/11/21 0909 08/11/21 0915  ?BP: (!) 149/98 (!) 144/87 (!) 143/81 (!) 143/81  ?Pulse: 96 93 98 (!) 107  ?Resp: '16 16 20   '$ ?Temp: 98.7 ?F (37.1 ?C) 98.3 ?F (36.8 ?C) 98.5 ?F (36.9 ?C)   ?TempSrc: Oral Oral Oral   ?SpO2: 94% 90% 96%   ?Weight:      ?Height:      ? ?No intake or output data in the 24 hours ending 08/11/21 0950 ?Filed Weights  ? 08/10/21 1000  ?Weight: 101.2 kg  ? ? ?Telemetry  ?  ?Atrial fib with CVR/RVR - Personally Reviewed ? ?ECG  ?  ?none - Personally Reviewed ? ?Physical Exam  ? ?GEN: No acute distress.   ?Neck: No JVD ?Cardiac: IRIRR, no murmurs, rubs, or gallops.  ?Respiratory: Clear to auscultation bilaterally. ?GI: Soft, nontender, non-distended  ?MS: No edema; No deformity. ?Neuro:  Nonfocal  ?Psych: Normal affect  ? ?Labs  ?  ?Chemistry ?Recent Labs  ?Lab 08/11/21 ?6468  ?NA 140  ?K 4.7  ?CL 106  ?CO2 27  ?GLUCOSE 126*  ?BUN 11  ?CREATININE 1.20  ?CALCIUM 9.3   ?GFRNONAA >60  ?ANIONGAP 7  ?  ? ?Hematology ?Recent Labs  ?Lab 08/11/21 ?0321  ?WBC 7.0  ?RBC 4.29  ?HGB 13.4  ?HCT 41.2  ?MCV 96.0  ?MCH 31.2  ?MCHC 32.5  ?RDW 13.7  ?PLT 175  ? ? ?Cardiac EnzymesNo results for input(s): TROPONINI in the last 168 hours. No results for input(s): TROPIPOC in the last 168 hours.  ? ?BNPNo results for input(s): BNP, PROBNP in the last 168 hours.  ? ?DDimer No results for input(s): DDIMER in the last 168 hours.  ? ?Radiology  ?  ?CARDIAC CATHETERIZATION ? ?Result Date: 08/10/2021 ?  Prox RCA to Mid RCA lesion is 30% stenosed.   Previously placed Prox LAD to Mid LAD stent (unknown type) is  widely patent.  Jailed 1st Diag lesion is 60% stenosed.   Previously placed Prox Cx to Dist Cx stent (unknown type) is  widely patent.   Lesion #1: Dist Cx lesion is 90% stenosed.   A drug-eluting stent was successfully placed using a STENT ONYX FRONTIER 2.25X12.  Deployed at 2.35  mm and postdilated to 2.45 mm in the overlap segment.   Post intervention, there is a 0% residual stenosis.   Lesion #2: 1st Mrg lesion is 80% stenosed with 95% stenosed side branch in Lat 1st Mrg.   Mid LAD lesion is 55% stenosed.   Dist LAD lesion is 50% stenosed.   A drug-eluting stent was successfully placed using a STENT ONYX FRONTIER 2.25X1 -> deployed to 2.45 mm.   Post intervention, there is a 0% residual stenosis.  Post intervention, the side branch was reduced to 100% residual stenosis.  (Patient did have chest pain with stent placement, resolved with sublingual nitroglycerin and fentanyl/Versed.) Multivessel CAD with 2 culprit lesions: Mid OM1 (85%) at small branch and distal LCx-OM2 (90%) just distal to prior stent.  Mild ISR in RCA stent and ostial diagonal (jailed) 60%.  Tandem 50 to 60% lesions in the mid LAD that are in hinge points.  Not likely flow-limiting. Successful two-vessel PCI of 2 culprit lesions reducing to 0% stenosis each treated with Onyx frontier DES 2.25 mm 12 mm stents dilated to 2.45 mm.  Preserved LVEF with low normal roughly 50 to 55%.  Inadequate filling-therefore unable to truly estimate EF and wall motion.   ? ?Cardiac Studies  ? ?See above ? ?Patient Profile  ?   ?81 y.o. male admitted with creschendo angina, s/p PCI X 2.  ? ?Assessment & Plan  ?  ?Creschendo angina - he is s/p PCI of the LCx and OM2  ?Persistent atrial fib - his rates are fairly well controlled but have been well controlled at home. DC on digoxin and toprol at outpatient dosing. ?Obesity - he will be encouraged to lose weight ?Coags - he will need to be on plavix and xarelto initially. Stop plavix when ok with Dr. Wilhemina Cash. Xarelto indefinitely. ? ?For questions or updates, please contact [facility] ?Please consult [email or web address] for contact info under Cardiology/STEMI. ?  ?   ?Signed, ?Cristopher Peru, MD  ?08/11/2021, 9:50 AM    ?

## 2021-08-11 NOTE — Progress Notes (Addendum)
Cardiac Rehab Phase I ?Attempted to see patient for ambulation, but patient declines at this time, and states he will walk when he gets home. Attempted MI/stent education, but patient not receptive, stating "you're wasting your breath" and that this is not his first rodeo and he will go home and do what he wants. Patient declined Scientist, clinical (histocompatibility and immunogenetics). Needs risk factor modification reinforcement from cardiologist. Discussed restrictions including no heavy lifting, pushing pulling etc. 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 Referral sent. ? ?Sol Passer, MS, ACSM CEP  ?08/11/2021 [phone] ?

## 2021-08-11 NOTE — Discharge Instructions (Signed)
Call [facility] at [phone] if any bleeding, swelling or drainage at cath site.  May shower, no tub baths for 48 hours for groin sticks. No lifting over 5 pounds for 3 days.  No Driving for 3 days ? ? ?Heart Healthy Diet  ? ?Call if any issues  ? ?Stop Nexium and use protonix - the protonix and plavix together could cause a reation ? ?Do not stop plavix this is to keep the new stents open  ?

## 2021-08-13 ENCOUNTER — Other Ambulatory Visit: Payer: Self-pay | Admitting: Cardiology

## 2021-08-13 ENCOUNTER — Telehealth: Payer: Self-pay

## 2021-08-13 ENCOUNTER — Encounter (HOSPITAL_COMMUNITY): Payer: Self-pay | Admitting: Cardiology

## 2021-08-13 NOTE — Telephone Encounter (Signed)
Mailbox full. TOC call. ?

## 2021-08-14 DIAGNOSIS — M25512 Pain in left shoulder: Secondary | ICD-10-CM | POA: Diagnosis not present

## 2021-08-14 DIAGNOSIS — M7542 Impingement syndrome of left shoulder: Secondary | ICD-10-CM | POA: Diagnosis not present

## 2021-08-17 ENCOUNTER — Telehealth (HOSPITAL_COMMUNITY): Payer: Self-pay

## 2021-08-17 NOTE — Telephone Encounter (Signed)
Outside referral faxed to [facility]. 

## 2021-08-20 NOTE — Telephone Encounter (Signed)
Called patient to get more information regarding why a message about him was sent to the triage inbox. Patient was unaware a message had been sent. I will send a message to the nurse who sent the message to get more information. ?

## 2021-08-28 ENCOUNTER — Telehealth: Payer: Self-pay

## 2021-08-28 NOTE — Telephone Encounter (Signed)
Clinical pharmacist to review Xarelto 

## 2021-08-28 NOTE — Telephone Encounter (Signed)
I attempted to reach out to the patient to see how urgent this procedure is and see if it is possible to delay this procedure as this patient just had 2 stents placed on 08/10/2021.  He is aware that coming off of Plavix this early would make it high risk for stent thrombosis since he just had the stents placed. Patient sounds very irritated. Before I could finish, he hanged up the phone.   ? ?When I called him back, he responded "I ain't got time for this bullshit". I informed the patient I will forward to Dr. Bettina Gavia to see if he has any recommendations. But before I could finish, he hanged up the phone again.  ? ?Will forward to Dr. Bettina Gavia and Dr. Ellyn Hack who did the cath, only alternative I can think of would be to consider admit the patient for a few days and bridge his plavix with either IV Cangrelor vs Aggrastat.  ? ?Please forward your response to P CV DIV PREOP ?

## 2021-08-28 NOTE — Telephone Encounter (Signed)
? ?  Pre-operative Risk Assessment  ?  ?Patient Name: Heber Hoog.  ?DOB: 10-May-1940 ?MRN: [id number]  ? ?  ? ?Request for Surgical Clearance   ? ?Procedure:   REVERSE TOTAL SHOULDER ? ?Date of Surgery:  Clearance TBD                              ?   ?Surgeon:  DR. Esmond Plants ?Surgeon's Group or Practice Name:  [place] [facility] [place] ?Phone number:  [phone] ?Fax number:  [phone] ATTN: ASHLEY HILTON ?  ?Type of Clearance Requested:   ?- Medical  ?- Pharmacy:  Hold Clopidogrel (Plavix) NEEDS INSTRUCTIONS  ?  ?Type of Anesthesia:  General  WITH ISB ?  ?Additional requests/questions:   ? ?Signed, ?Jacinta Shoe   ?08/28/2021, 2:08 PM   ?

## 2021-08-29 NOTE — Telephone Encounter (Signed)
Patient with diagnosis of afib on Xarelto for anticoagulation.   ? ?Procedure: REVERSE TOTAL SHOULDER ?Date of procedure: TBD ? ? ?CHA2DS2-VASc Score = 5  ? This indicates a 7.2% annual risk of stroke. ?The patient's score is based upon: ?CHF History: 1 ?HTN History: 1 ?Diabetes History: 0 ?Stroke History: 0 ?Vascular Disease History: 1 ?Age Score: 2 ?Gender Score: 0 ?  ?  ? ?CrCl 56 ml/min ? ?Per office protocol, patient can hold Xarelto for 3 days prior to procedure.   ? ? ?

## 2021-08-29 NOTE — Telephone Encounter (Signed)
OK to hold per protocol ? ?--  ? ?DH ?

## 2021-08-30 NOTE — Telephone Encounter (Signed)
Called patient and informed him not to stop his Plavix for six months per Dr. Bettina Gavia. Patient stated he understood. ?

## 2021-08-30 NOTE — Telephone Encounter (Signed)
Patient contacted regarding discharge from  [facility] on 08/10/21. ? ?Patient understands to follow up with provider Dr. Bettina Gavia on 5/17 at 9:00 am at [facility]. ?Patient understands discharge instructions? yes ?Patient understands medications and regiment? yes ?Patient understands to bring all medications to this visit? yes ? ? ? ?

## 2021-09-03 NOTE — Telephone Encounter (Signed)
? ?  Name: Andrew Hardy.  ?DOB: 05-31-1940  ?MRN: [id number] ? ?Primary Cardiologist: Shirlee More, MD ? ?Chart reviewed as part of pre-operative protocol coverage. Because of Andrew MINKIN Jr.'s past medical history and time since last visit, he will require a follow-up in-office visit in order to better assess preoperative cardiovascular risk. ? ?Pre-op covering staff: ?- Please schedule appointment and call patient to inform them. If patient already had an upcoming appointment within acceptable timeframe, please add "pre-op clearance" to the appointment notes so provider is aware. ?- Please contact requesting surgeon's office via preferred method (i.e, phone, fax) to inform them of need for appointment prior to surgery. ? ?Per Dr. Bettina Gavia, primary cardiologist, patient may not his stop his Plavix for 6 months given he is post recent PCI.  Plavix could potentially be held pending MD approval beginning 02/10/2022. ? ?Patient also with diagnosis of afib on Xarelto for anticoagulation.   ?  ?Procedure: REVERSE TOTAL SHOULDER ?Date of procedure: TBD ?  ?  ?CHA2DS2-VASc Score = 5  ? This indicates a 7.2% annual risk of stroke. ?The patient's score is based upon: ?CHF History: 1 ?HTN History: 1 ?Diabetes History: 0 ?Stroke History: 0 ?Vascular Disease History: 1 ?Age Score: 2 ?Gender Score: 0 ?  ?  ?  ?CrCl 56 ml/min ?  ?Per office protocol, patient can hold Xarelto for 3 days prior to procedure.   ?Lenna Sciara, NP  ?09/03/2021, 10:26 AM  ? ?

## 2021-09-03 NOTE — Telephone Encounter (Signed)
Pt has appt 09/05/21 with DR. Munley. Will forward notes to MD for upcoming appt. Will send FYI to requesting office the pt has appt 09/05/21.  ?

## 2021-09-05 ENCOUNTER — Encounter: Payer: Self-pay | Admitting: Cardiology

## 2021-09-05 ENCOUNTER — Ambulatory Visit (INDEPENDENT_AMBULATORY_CARE_PROVIDER_SITE_OTHER): Payer: PPO | Admitting: Cardiology

## 2021-09-05 VITALS — BP 122/70 | HR 88 | Ht 67.0 in | Wt 218.0 lb

## 2021-09-05 DIAGNOSIS — E782 Mixed hyperlipidemia: Secondary | ICD-10-CM | POA: Diagnosis not present

## 2021-09-05 DIAGNOSIS — I25119 Atherosclerotic heart disease of native coronary artery with unspecified angina pectoris: Secondary | ICD-10-CM

## 2021-09-05 DIAGNOSIS — Z79899 Other long term (current) drug therapy: Secondary | ICD-10-CM | POA: Diagnosis not present

## 2021-09-05 DIAGNOSIS — I4819 Other persistent atrial fibrillation: Secondary | ICD-10-CM

## 2021-09-05 DIAGNOSIS — Z7901 Long term (current) use of anticoagulants: Secondary | ICD-10-CM | POA: Diagnosis not present

## 2021-09-05 DIAGNOSIS — I110 Hypertensive heart disease with heart failure: Secondary | ICD-10-CM | POA: Diagnosis not present

## 2021-09-05 NOTE — Progress Notes (Signed)
?Cardiology Office Note:   ? ?Date:  09/05/2021  ? ?ID:  Andrew Hardy., DOB 1940/06/21, MRN [id number] ? ?PCP:  Cyndi Bender, PA-C  ?Cardiologist:  Shirlee More, MD   ? ?Referring MD: Cyndi Bender, PA-C  ? ? ?ASSESSMENT:   ? ?1. Persistent atrial fibrillation ([place])   ?2. High risk medication use   ?3. Chronic anticoagulation   ?4. Coronary artery disease involving native coronary artery of native heart with angina pectoris ([place])   ?5. Hypertensive heart disease with heart failure ([place])   ?6. Mixed hyperlipidemia   ? ?PLAN:   ? ?In order of problems listed above: ? ?Stable rate is controlled continue his beta-blocker and low-dose digoxin no findings of toxicity and his current antithrombotic therapy with DOAC and clopidogrel.  For surgery can withdrawal his anticoagulant 3 full days prior and clopidogrel 5 to 7 days prior we will need to restart his anticoagulant 48 hours after surgery and clopidogrel 24 hours afterwards. ?Stable CAD no angina ?BP is at target heart failure is compensated continue his current loop diuretic ?Continue his statin LDL at target ? ? ?Next appointment: 4 months ? ? ?Medication Adjustments/Labs and Tests Ordered: ?Current medicines are reviewed at length with the patient today.  Concerns regarding medicines are outlined above.  ?No orders of the defined types were placed in this encounter. ? ?No orders of the defined types were placed in this encounter. ? ? ?Chief Complaint  ?Patient presents with  ? legs and hand cramps  ?I intend to have orthopedic surgery ? ?History of Present Illness:   ? ?Andrew Hardy. is a 81 y.o. 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  Ejection fraction was 55% with normal left ventricular function.  Previous intervention includes drug-eluting stent to left circumflex coronary artery for unstable angina 10/17/2014.  ? ?This visit was prompted by phone calls the office with role of antithrombotic therapy for elective shoulder surgery.  I reviewed the notes from Dr. Landis Gandy who is recommending elective surgery in late July of this year.  I think this was a reasonable recommendation that would allow for 3 months of clopidogrel therapy prior to elective shoulder surgery.  He will need to resume clopidogrel 24 hours to 48 hours after surgery with the brief is to wean dose perioperatively. ? ?Compliance with diet, lifestyle and medications: Yes ? ?Since PCI stent has had no angina but still has recovery of strength and endurance. ?He has no edema orthopnea he is short of breath with more activities no palpitation or syncope ?I reviewed his intended orthopedic surgery he had elective PCI without ACS I think it 3 months to be safe to withdrawal his anticoagulant along with clopidogrel and send a note to his orthopedic surgeon ?Said no bleeding complications ?Intolerant to statin without muscle pain or weakness ?Last LDL was 52. ? ?08/10/2021: ?Conclusion ?  Prox RCA to Mid RCA lesion is 30% stenosed. ?  Previously placed Prox LAD to Mid LAD stent (unknown type) is  widely patent.  Jailed 1st Diag lesion is 60% stenosed. ?  Previously placed Prox  Cx to Dist Cx stent (unknown type) is  widely patent. ?  Lesion #1: Dist Cx lesion is 90% stenosed. ?  A drug-eluting stent was successfully placed using a [place] [id number].  Deployed at 2.35 mm and postdilated to 2.45 mm in the overlap segment. ?  Post intervention, there is a 0% residual stenosis. ?  Lesion #2: 1st Mrg lesion is 80% stenosed with 95% stenosed side branch in Lat  1st Mrg. ?  Mid LAD lesion is 55% stenosed. ?  Dist LAD lesion is 50% stenosed. ?  A drug-eluting stent was successfully placed using a STENT ONYX FRONTIER 2.25X1 -> deployed to 2.45 mm. ?  Post intervention, there is a 0% residual stenosis.  Post intervention, the side branch was reduced to 100% residual stenosis.  (Patient did have chest pain with stent placement, resolved with sublingual nitroglycerin and fentanyl/Versed.) ?  ?Multivessel CAD with 2 culprit lesions: Mid OM1 (85%) at small branch and distal LCx-OM2 (90%) just distal to prior stent.  Mild ISR in RCA stent and ostial diagonal (jailed) 60%.  Tandem 50 to 60% lesions in the mid LAD that are in hinge points.  Not likely flow-limiting. ?Successful two-vessel PCI of 2 culprit lesions reducing to 0% stenosis each treated with Onyx frontier DES 2.25 mm 12 mm stents dilated to 2.45 mm. ?Preserved LVEF with low normal roughly 50 to 55%.  Inadequate filling-therefore unable to truly estimate EF and wall motion. ?  ?Coronary Diagrams ? ?Diagnostic ?Dominance: Right ?Intervention ? ? ?Past Medical History:  ?Diagnosis Date  ? Anxiety   ? Arthritis   ? Atypical atrial flutter ([place]) 12/14/2014  ? Bradycardia, sinus 10/15/2014  ? Burning sensation of feet 01/17/2016  ? CAD, multiple vessel 10/18/2014  ? Overview:  DES to [facility] for unstable angina 10/17/14 Diagnostic Summary Severe stenosis of mid Cx likely cause of recent accelerating angina Diffuse severe stenosis of small caliber LAD unchanged from 2012 Normal LV function Paroxysmal SVT noted during procedure 04/23/16:  PCI / Resolute Drug Eluting Stent of the proximal Left Anterior Descending Coronary Artery. Successful PTCA of the ostial 1st Diagonal Coronary Artery  ? Chronic left-sided low back pain with sciatica 02/21/2016  ? Coronary artery disease 2003  ? s/p MI and numerous stents  ? Coronary artery disease involving native coronary artery of native heart with angina pectoris ([place]) 10/18/2014  ? Overview:  DES  to [facility] for unstable angina 10/17/14 Diagnostic Summary Severe stenosis of mid Cx likely cause of recent accelerating angina Diffuse severe stenosis of small caliber LAD unchanged from 2012 Normal LV function Paroxysmal SVT noted during procedure 04/23/16:  PCI / Resolute Drug Eluting Stent of the proximal Left Anterior Descending Coronary Artery. Successful PTCA of the ostial 1st Di  ? Degeneration of lumbar intervertebral disc 05/02/2017  ? Degenerative spondylolisthesis 05/02/2017  ? Depression   ? Enlarged prostate without lower urinary tract symptoms (luts) 2012  ? frequent urination and nocturia; sees dr [name] Late in [place]  ? Essential hypertension 10/15/2014  ? Gastroesophageal reflux disease without esophagitis 04/21/2016  ? GERD (gastroesophageal reflux disease)   ? Hyperlipidemia   ? Hypertension   ? Hypertensive heart disease with heart failure ([place]) 12/20/2016  ? IHD (ischemic heart disease) 10/15/2014  ? Lower limb pain, inferior, right 12/17/2017  ? Lumbar radiculopathy 02/21/2016  ? Myocardial infarction [facility]) 2003  ? NSTEMI (non-ST elevated myocardial infarction) ([place]) 04/24/2016  ? Overview:  LHC 04/24/15: Severe stenosis of the LAD & Diagonal bifurcation  LV ejection fraction is 55-60 %  Interventional Summary Successful Angiosculpt PCI / Resolute Drug Eluting Stent of the proximal LAD Successful PTCA of the ostial 1st Diagonal Coronary Artery  ? Paroxysmal SVT (supraventricular tachycardia) ([place]) 11/16/2014  ? Precordial pain 10/15/2014  ? PVC's (premature ventricular contractions) 04/22/2016  ? RBBB 10/15/2014  ? S/P angioplasty with stent 08/10/21 with DES to mid OM1 and distal LCX-OM2 08/11/2021  ? S/P lumbar fusion 04/24/2017  ? Sacroiliac joint pain 09/10/2018  ? Sleep disturbances 01/17/2016  ? Snoring 01/17/2016  ? Somatic dysfunction of left sacroiliac joint 06/19/2018  ? Subscapularis (muscle) sprain 03/29/2011  ? 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 ? ?Past Surgical History:  ?Procedure  Laterality Date  ? BIOPSY  04/24/2020  ? Procedure: BIOPSY;  Surgeon: Jerene Bears, MD;  Location: [facility] ENDOSCOPY;  Service: Gastroenterology;;  ? CARDIAC CATHETERIZATION  [phone]  ? ptca/[place] in [place]  ? [place]

## 2021-09-05 NOTE — Patient Instructions (Signed)
Medication Instructions:  ?Your physician recommends that you continue on your current medications as directed. Please refer to the Current Medication list given to you today.  ?*If you need a refill on your cardiac medications before your next appointment, please call your pharmacy* ? ? ?Lab Work: ?None Ordered ?If you have labs (blood work) drawn today and your tests are completely normal, you will receive your results only by: ?MyChart Message (if you have MyChart) OR ?A paper copy in the mail ?If you have any lab test that is abnormal or we need to change your treatment, we will call you to review the results. ? ? ?Testing/Procedures: ?None Ordered ? ? ?Follow-Up: ?At [facility], you and your health needs are our priority.  As part of our continuing mission to provide you with exceptional heart care, we have created designated Provider Care Teams.  These Care Teams include your primary Cardiologist (physician) and Advanced Practice Providers (APPs -  Physician Assistants and Nurse Practitioners) who all work together to provide you with the care you need, when you need it. ? ?We recommend signing up for the patient portal called "MyChart".  Sign up information is provided on this After Visit Summary.  MyChart is used to connect with patients for Virtual Visits (Telemedicine).  Patients are able to view lab/test results, encounter notes, upcoming appointments, etc.  Non-urgent messages can be sent to your provider as well.   ?To learn more about what you can do with MyChart, go to [email or web address].   ? ?Your next appointment:   ?4 month(s) ? ?The format for your next appointment:   ?In Person ? ?Provider:   ?Shirlee More, MD ? ? ?Other Instructions ?NA  ?

## 2021-09-10 ENCOUNTER — Other Ambulatory Visit: Payer: Self-pay | Admitting: Cardiology

## 2021-09-14 ENCOUNTER — Other Ambulatory Visit: Payer: Self-pay | Admitting: Cardiology

## 2021-09-18 DIAGNOSIS — Z79899 Other long term (current) drug therapy: Secondary | ICD-10-CM | POA: Diagnosis not present

## 2021-09-18 DIAGNOSIS — G8929 Other chronic pain: Secondary | ICD-10-CM | POA: Diagnosis not present

## 2021-09-18 DIAGNOSIS — I4891 Unspecified atrial fibrillation: Secondary | ICD-10-CM | POA: Diagnosis not present

## 2021-09-18 DIAGNOSIS — R252 Cramp and spasm: Secondary | ICD-10-CM | POA: Diagnosis not present

## 2021-09-18 DIAGNOSIS — E1169 Type 2 diabetes mellitus with other specified complication: Secondary | ICD-10-CM | POA: Diagnosis not present

## 2021-09-18 DIAGNOSIS — M549 Dorsalgia, unspecified: Secondary | ICD-10-CM | POA: Diagnosis not present

## 2021-09-18 DIAGNOSIS — K219 Gastro-esophageal reflux disease without esophagitis: Secondary | ICD-10-CM | POA: Diagnosis not present

## 2021-09-18 DIAGNOSIS — N400 Enlarged prostate without lower urinary tract symptoms: Secondary | ICD-10-CM | POA: Diagnosis not present

## 2021-09-18 DIAGNOSIS — G4700 Insomnia, unspecified: Secondary | ICD-10-CM | POA: Diagnosis not present

## 2021-09-18 DIAGNOSIS — E7800 Pure hypercholesterolemia, unspecified: Secondary | ICD-10-CM | POA: Diagnosis not present

## 2021-09-18 DIAGNOSIS — I25118 Atherosclerotic heart disease of native coronary artery with other forms of angina pectoris: Secondary | ICD-10-CM | POA: Diagnosis not present

## 2021-09-18 DIAGNOSIS — I10 Essential (primary) hypertension: Secondary | ICD-10-CM | POA: Diagnosis not present

## 2021-09-18 DIAGNOSIS — M19012 Primary osteoarthritis, left shoulder: Secondary | ICD-10-CM | POA: Diagnosis not present

## 2021-09-19 ENCOUNTER — Ambulatory Visit: Payer: Medicare Other | Admitting: Cardiology

## 2021-10-01 ENCOUNTER — Ambulatory Visit: Payer: PPO | Admitting: Cardiology

## 2021-10-15 DIAGNOSIS — H52223 Regular astigmatism, bilateral: Secondary | ICD-10-CM | POA: Diagnosis not present

## 2021-10-15 DIAGNOSIS — H5203 Hypermetropia, bilateral: Secondary | ICD-10-CM | POA: Diagnosis not present

## 2021-10-15 DIAGNOSIS — H01001 Unspecified blepharitis right upper eyelid: Secondary | ICD-10-CM | POA: Diagnosis not present

## 2021-11-06 NOTE — H&P (Signed)
Patient's anticipated LOS is less than 2 midnights, meeting these requirements: - Younger than 56 - Lives within 1 hour of care - Has a competent adult at home to recover with post-op recover - NO history of  - Chronic pain requiring opiods  - Diabetes  - Coronary Artery Disease  - Heart failure  - Heart attack  - Stroke  - DVT/VTE  - Cardiac arrhythmia  - Respiratory Failure/COPD  - Renal failure  - Anemia  - Advanced Liver disease     Andrew Hardy. is an 81 y.o. male.    Chief Complaint: left shoulder pain  HPI: Pt is a 81 y.o. male complaining of left shoulder pain for multiple years. Pain had continually increased since the beginning. X-rays in the clinic show end-stage arthritic changes of the left shoulder. Pt has tried various conservative treatments which have failed to alleviate their symptoms, including injections and therapy. Various options are discussed with the patient. Risks, benefits and expectations were discussed with the patient. Patient understand the risks, benefits and expectations and wishes to proceed with surgery.   PCP:  Cyndi Bender, PA-C  D/C Plans: Home  PMH: Past Medical History:  Diagnosis Date   Anxiety    Arthritis    Atypical atrial flutter ([place]) 12/14/2014   Bradycardia, sinus 10/15/2014   Burning sensation of feet 01/17/2016   CAD, multiple vessel 10/18/2014   Overview:  DES to [facility] for unstable angina 10/17/14 Diagnostic Summary Severe stenosis of mid Cx likely cause of recent accelerating angina Diffuse severe stenosis of small caliber LAD unchanged from 2012 Normal LV function Paroxysmal SVT noted during procedure 04/23/16:  PCI / Resolute Drug Eluting Stent of the proximal Left Anterior Descending Coronary Artery. Successful PTCA of the ostial 1st Diagonal Coronary Artery   Chronic left-sided low back pain with sciatica 02/21/2016   Coronary artery disease 2003   s/p MI and numerous stents   Coronary artery disease involving native  coronary artery of native heart with angina pectoris ([place]) 10/18/2014   Overview:  DES to [facility] for unstable angina 10/17/14 Diagnostic Summary Severe stenosis of mid Cx likely cause of recent accelerating angina Diffuse severe stenosis of small caliber LAD unchanged from 2012 Normal LV function Paroxysmal SVT noted during procedure 04/23/16:  PCI / Resolute Drug Eluting Stent of the proximal Left Anterior Descending Coronary Artery. 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   Degenerative spondylolisthesis 05/02/2017   Depression    Enlarged prostate without lower urinary tract symptoms (luts) 2012   frequent urination and nocturia; sees dr [name] Late in [place]   Essential hypertension 10/15/2014   Gastroesophageal reflux disease without esophagitis 04/21/2016   GERD (gastroesophageal reflux disease)    Hyperlipidemia    Hypertension    Hypertensive heart disease with heart failure ([place]) 12/20/2016   IHD (ischemic heart disease) 10/15/2014   Lower limb pain, inferior, right 12/17/2017   Lumbar radiculopathy 02/21/2016   Myocardial infarction [facility]) 2003   NSTEMI (non-ST elevated myocardial infarction) ([place]) 04/24/2016   Overview:  LHC 04/24/15: Severe stenosis of the LAD & Diagonal bifurcation  LV ejection fraction is 55-60 %  Interventional Summary Successful Angiosculpt PCI / Resolute Drug Eluting Stent of the proximal LAD Successful PTCA of the ostial 1st Diagonal Coronary Artery   Paroxysmal SVT (supraventricular tachycardia) ([place]) 11/16/2014   Precordial pain 10/15/2014   PVC's (premature ventricular contractions) 04/22/2016   RBBB 10/15/2014   S/P angioplasty with stent  08/10/21 with DES to mid OM1 and distal LCX-OM2 08/11/2021   S/P lumbar fusion 04/24/2017   Sacroiliac joint pain 09/10/2018   Sleep disturbances 01/17/2016   Snoring 01/17/2016   Somatic dysfunction of left sacroiliac joint 06/19/2018   Subscapularis (muscle) sprain 03/29/2011   Traumatic  amputation of right ring finger 03/20/2015    PSH: Past Surgical History:  Procedure Laterality Date   BIOPSY  04/24/2020   Procedure: BIOPSY;  Surgeon: Jerene Bears, MD;  Location: [place];  Service: Gastroenterology;;   CARDIAC CATHETERIZATION  10-2010   ptca/stent in [place]   CARDIAC CATHETERIZATION  04/23/2016   COLONOSCOPY WITH PROPOFOL N/A 04/24/2020   Procedure: COLONOSCOPY WITH PROPOFOL;  Surgeon: Jerene Bears, MD;  Location: [place];  Service: Gastroenterology;  Laterality: N/A;   CORONARY ANGIOPLASTY     CORONARY STENT INTERVENTION N/A 08/10/2021   Procedure: CORONARY STENT INTERVENTION;  Surgeon: Leonie Man, MD;  Location: [place] CV LAB;  Service: Cardiovascular;  Laterality: N/A;   ESOPHAGOGASTRODUODENOSCOPY (EGD) WITH PROPOFOL N/A 04/24/2020   Procedure: ESOPHAGOGASTRODUODENOSCOPY (EGD) WITH PROPOFOL;  Surgeon: Jerene Bears, MD;  Location: [place];  Service: Gastroenterology;  Laterality: N/A;   EYE SURGERY  05-1010   bil cataract ,iol   FRACTURE SURGERY     HEMOSTASIS CONTROL  04/24/2020   Procedure: HEMOSTASIS CONTROL;  Surgeon: Jerene Bears, MD;  Location: [facility] ENDOSCOPY;  Service: Gastroenterology;;   I & D EXTREMITY Right 02/23/2015   Procedure: RIGHT RING FINGER REVISION AMPUTATION;  Surgeon: Leanora Cover, MD;  Location: [place];  Service: Orthopedics;  Laterality: Right;   KNEE ARTHROSCOPY W/ MENISCAL REPAIR  2000   left knee   LEFT HEART CATH AND CORONARY ANGIOGRAPHY N/A 08/10/2021   Procedure: LEFT HEART CATH AND CORONARY ANGIOGRAPHY;  Surgeon: Leonie Man, MD;  Location: [place] CV LAB;  Service: Cardiovascular;  Laterality: N/A;   LUMBAR EPIDURAL INJECTION  2011   low back pain; DDD   POLYPECTOMY  04/24/2020   Procedure: POLYPECTOMY;  Surgeon: Jerene Bears, MD;  Location: [facility] ENDOSCOPY;  Service: Gastroenterology;;   ring finger tramatic qamputation Right    SHOULDER ARTHROSCOPY  03/29/2011   Procedure: ARTHROSCOPY SHOULDER;  Surgeon:  Augustin Schooling;  Location: [place];  Service: Orthopedics;  Laterality: Right;  RIGHT SHOULDER ARTHROSCOPY WITH OPEN SUBSCAPULAR REPAIR   SHOULDER OPEN ROTATOR CUFF REPAIR     3 on rt shoulder; 2 on left   TRANSFORAMINAL LUMBAR INTERBODY FUSION (TLIF) WITH PEDICLE SCREW FIXATION 1 LEVEL N/A 04/24/2017   Procedure: TRANSFORAMINAL LUMBAR INTERBODY FUSION (TLIF) L4-5;  Surgeon: Melina Schools, MD;  Location: [place];  Service: Orthopedics;  Laterality: N/A;  4 hrs    Social History:  reports that he quit smoking about 27 years ago. His smoking use included cigarettes. He has a 20.00 pack-year smoking history. He quit smokeless tobacco use about 25 years ago.  His smokeless tobacco use included chew. He reports current alcohol use of about 6.0 standard drinks of alcohol per week. He reports that he does not use drugs. BMI: Estimated body mass index is 34.14 kg/m as calculated from the following:   Height as of 09/05/21: '5\' 7"'$  (1.702 m).   Weight as of 09/05/21: 98.9 kg.  Lab Results  Component Value Date   ALBUMIN 4.5 03/12/2021   Diabetes: Patient does not have a diagnosis of diabetes.     Smoking Status: Social History   Tobacco Use  Smoking Status Former  Packs/day: 1.00   Years: 20.00   Total pack years: 20.00   Types: Cigarettes   Quit date: 03/20/1994   Years since quitting: 27.6  Smokeless Tobacco Former   Types: Chew   Quit date: 03/20/1996   The patient is not currently a tobacco user. Counseling given: Not Answered     Allergies:  Allergies  Allergen Reactions   Amiodarone     Eye toxicity    Sotalol Hcl     Feet burning   Buprenorphine Hcl Itching   Codeine Itching   Hydrocodone Itching   Percocet [Oxycodone-Acetaminophen] Itching    Pt.states he can take but must take with benadryl    Medications: No current facility-administered medications for this encounter.   Current Outpatient Medications  Medication Sig Dispense Refill   acetaminophen (TYLENOL) 325  MG tablet Take 2 tablets (650 mg total) by mouth every 6 (six) hours as needed (pain).     clopidogrel (PLAVIX) 75 MG tablet Take 1 tablet (75 mg total) by mouth daily with breakfast. 30 tablet 11   Co-Enzyme Q-10 30 MG CAPS Take 30 mg by mouth daily.     Cyanocobalamin (B-12 PO) Take 1 tablet by mouth daily.     digoxin (LANOXIN) 0.25 MG tablet Take 1 tablet (250 mcg total) by mouth daily. 90 tablet 3   DULoxetine (CYMBALTA) 30 MG capsule Take 30 mg by mouth 2 (two) times daily.     ezetimibe (ZETIA) 10 MG tablet TAKE 1 TABLET EVERY EVENING 90 tablet 3   finasteride (PROSCAR) 5 MG tablet Take 5 mg by mouth daily.     Flaxseed, Linseed, (FLAXSEED OIL MAX STR) 1300 MG CAPS Take 1,300 mg by mouth daily.     furosemide (LASIX) 80 MG tablet Take 1 tablet (80 mg total) by mouth every morning AND 0.5 tablets (40 mg total) every evening. 135 tablet 1   gemfibrozil (LOPID) 600 MG tablet Take 0.5 tablets (300 mg total) by mouth 2 (two) times daily before a meal. 90 tablet 1   Glucosamine HCl (GLUCOSAMINE PO) Take 1 tablet by mouth daily.     isosorbide mononitrate (IMDUR) 60 MG 24 hr tablet Take 1 tablet (60 mg total) by mouth daily. TAKE 1 TABLET(60 MG) BY MOUTH DAILY 90 tablet 1   metoprolol succinate (TOPROL-XL) 25 MG 24 hr tablet Take 1 tablet (25 mg total) by mouth 2 (two) times daily. 180 tablet 1   minocycline (MINOCIN,DYNACIN) 50 MG capsule Take 50 mg by mouth daily.  2   Multiple Vitamins-Minerals (MULTIVITAMINS THER. W/MINERALS) TABS Take 1 tablet by mouth daily.     nitroGLYCERIN (NITROSTAT) 0.4 MG SL tablet Place 1 tablet (0.4 mg total) under the tongue every 5 (five) minutes as needed for chest pain. 25 tablet 7   Omega-3 Fatty Acids (FISH OIL PO) Take 1 tablet by mouth daily.     pantoprazole (PROTONIX) 40 MG tablet Take 1 tablet (40 mg total) by mouth daily. 30 tablet 6   potassium chloride SA (KLOR-CON M20) 20 MEQ tablet Take 2 tablets (40 mEq total) by mouth every morning AND 1 tablet (20  mEq total) every evening. 270 tablet 1   PUMPKIN SEED PO Take 1 tablet by mouth daily.     rivaroxaban (XARELTO) 20 MG TABS tablet Take 1 tablet (20 mg total) by mouth daily with supper. 90 tablet 1   simvastatin (ZOCOR) 40 MG tablet TAKE 1 TABLET(40 MG) BY MOUTH AT BEDTIME 90 tablet 3   tamsulosin (FLOMAX)  0.4 MG CAPS capsule Take 0.4 mg by mouth daily.     traZODone (DESYREL) 50 MG tablet Take 50 mg by mouth at bedtime.     TURMERIC CURCUMIN PO Take 1 capsule by mouth daily.      No results found for this or any previous visit (from the past 48 hour(s)). No results found.  ROS: Pain with rom of the left upper extremity  Physical Exam: Alert and oriented 81 y.o. male in no acute distress Cranial nerves 2-12 intact Cervical spine: full rom with no tenderness, nv intact distally Chest: active breath sounds bilaterally, no wheeze rhonchi or rales Heart: regular rate and rhythm, no murmur Abd: non tender non distended with active bowel sounds Hip is stable with rom  Left shoulder with painful and weak rom Nv intact distaly   Assessment/Plan Assessment: left shoulder cuff arthropathy  Plan:  Patient will undergo a left reverse total shoulder by Dr. Veverly Fells at [place] Risks benefits and expectations were discussed with the patient. Patient understand risks, benefits and expectations and wishes to proceed. Preoperative templating of the joint replacement has been completed, documented, and submitted to the Operating Room personnel in order to optimize intra-operative equipment management.   Merla Riches PA-C, MPAS [facility] is now [employer] [address], [place], [place], [place] [address] Phone: [phone] [email or web address] Facebook  [employer]

## 2021-11-12 NOTE — Progress Notes (Signed)
[facility]- Preparing for Total Shoulder Arthroplasty  °  °Before surgery, you can play an important role. Because skin is not sterile, your skin needs to be as free of germs as possible. You can reduce the number of germs on your skin by using the following products. °Benzoyl Peroxide Gel °Reduces the number of germs present on the skin °Applied twice a day to shoulder area starting two days before surgery   ° °================================================================== ° °Please follow these instructions carefully: ° °BENZOYL PEROXIDE 5% GEL ° °Please do not use if you have an allergy to benzoyl peroxide.   If your skin becomes reddened/irritated stop using the benzoyl peroxide. ° °Starting two days before surgery, apply as follows: °Apply benzoyl peroxide in the morning and at night. Apply after taking a shower. If you are not taking a shower clean entire shoulder front, back, and side along with the armpit with a clean wet washcloth. ° °Place a quarter-sized dollop on your shoulder and rub in thoroughly, making sure to cover the front, back, and side of your shoulder, along with the armpit.  ° °2 days before ____ AM   ____ PM              1 day before ____ AM   ____ PM °                        °Do this twice a day for two days.  (Last application is the night before surgery, AFTER using the CHG soap as described below). ° °Do NOT apply benzoyl peroxide gel on the day of surgery.  °

## 2021-11-12 NOTE — Progress Notes (Signed)
DUE TO COVID-19 ONLY  2  VISITOR IS ALLOWED TO COME WITH YOU AND STAY IN THE WAITING ROOM ONLY DURING PRE OP AND PROCEDURE DAY OF SURGERY.  4  VISITOR  MAY VISIT WITH YOU AFTER SURGERY IN YOUR PRIVATE ROOM DURING VISITING HOURS ONLY! YOU MAY HAVE ONE PERSON SPEND THE NITE WITH YOU IN YOUR ROOM AFTER SURGERY.     Your procedure is scheduled on:           11/23/2021   Report to [facility] Main  Entrance   Report to admitting at     0515             AM DO NOT [place], PICTURE ID OR WALLET DAY OF SURGERY.      Call this number if you have problems the morning of surgery [phone]    REMEMBER: NO  SOLID FOODS , CANDY, GUM OR MINTS AFTER [place] .       Marland Kitchen CLEAR LIQUIDS UNTIL    0415am             DAY OF SURGERY.      PLEASE FINISH ENSURE DRINK PER SURGEON ORDER  WHICH NEEDS TO BE COMPLETED AT  0415am        MORNING OF SURGERY.       CLEAR LIQUID DIET   Foods Allowed      WATER BLACK COFFEE ( SUGAR OK, NO MILK, CREAM OR CREAMER) REGULAR AND DECAF  TEA ( SUGAR OK NO MILK, CREAM, OR CREAMER) REGULAR AND DECAF  PLAIN JELLO ( NO RED)  FRUIT ICES ( NO RED, NO FRUIT PULP)  POPSICLES ( NO RED)  JUICE- APPLE, WHITE GRAPE AND WHITE CRANBERRY  SPORT DRINK LIKE GATORADE ( NO RED)        BRUSH YOUR TEETH MORNING OF SURGERY AND RINSE YOUR MOUTH OUT, NO CHEWING GUM CANDY OR MINTS.     Take these medicines the morning of surgery with A SIP OF WATER:  lANOXIN, CYMBALTA, PROSCAR, IMDUR, PROTONIX, FLOMAX    DO NOT TAKE ANY DIABETIC MEDICATIONS DAY OF YOUR SURGERY                               You may not have any metal on your body including hair pins and              piercings  Do not wear jewelry, make-up, lotions, powders or perfumes, deodorant             Do not wear nail polish on your fingernails.              IF YOU ARE A MALE AND WANT TO SHAVE UNDER ARMS OR LEGS PRIOR TO SURGERY YOU MUST DO SO AT LEAST 48 HOURS PRIOR TO SURGERY.               Men may shave face and neck.   Do not bring valuables to the hospital. [place].  Contacts, dentures or bridgework may not be worn into surgery.  Leave suitcase in the car. After surgery it may be brought to your room.     Patients discharged the day of surgery will not be allowed to drive home. IF YOU ARE HAVING SURGERY AND GOING HOME THE SAME DAY, YOU  MUST HAVE AN ADULT TO DRIVE YOU HOME AND BE WITH YOU FOR 24 HOURS. YOU MAY GO HOME BY TAXI OR UBER OR ORTHERWISE, BUT AN ADULT MUST ACCOMPANY YOU HOME AND STAY WITH YOU FOR 24 HOURS.                Please read over the following fact sheets you were given: _____________________________________________________________________  [facility] - Preparing for Surgery Before surgery, you can play an important role.  Because skin is not sterile, your skin needs to be as free of germs as possible.  You can reduce the number of germs on your skin by washing with CHG (chlorahexidine gluconate) soap before surgery.  CHG is an antiseptic cleaner which kills germs and bonds with the skin to continue killing germs even after washing. Please DO NOT use if you have an allergy to CHG or antibacterial soaps.  If your skin becomes reddened/irritated stop using the CHG and inform your nurse when you arrive at Short Stay. Do not shave (including legs and underarms) for at least 48 hours prior to the first CHG shower.  You may shave your face/neck. Please follow these instructions carefully:  1.  Shower with CHG Soap the night before surgery and the  morning of Surgery.  2.  If you choose to wash your hair, wash your hair first as usual with your  normal  shampoo.  3.  After you shampoo, rinse your hair and body thoroughly to remove the  shampoo.                           4.  Use CHG as you would any other liquid soap.  You can apply chg directly  to the skin and wash                       Gently with a scrungie or  clean washcloth.  5.  Apply the CHG Soap to your body ONLY FROM THE NECK DOWN.   Do not use on face/ open                           Wound or open sores. Avoid contact with eyes, ears mouth and genitals (private parts).                       Wash face,  Genitals (private parts) with your normal soap.             6.  Wash thoroughly, paying special attention to the area where your surgery  will be performed.  7.  Thoroughly rinse your body with warm water from the neck down.  8.  DO NOT shower/wash with your normal soap after using and rinsing off  the CHG Soap.                9.  Pat yourself dry with a clean towel.            10.  Wear clean pajamas.            11.  Place clean sheets on your bed the night of your first shower and do not  sleep with pets. Day of Surgery : Do not apply any lotions/deodorants the morning of surgery.  Please wear clean clothes to the hospital/surgery center.  FAILURE TO FOLLOW THESE INSTRUCTIONS MAY RESULT IN THE CANCELLATION OF YOUR  SURGERY PATIENT SIGNATURE_________________________________  NURSE SIGNATURE__________________________________  ________________________________________________________________________

## 2021-11-12 NOTE — Progress Notes (Addendum)
Anesthesia Review:  PCP: Cyndi Bender  [facility] - Called and requested most recent ov note be faxed.   LOV 09/18/21 on chart. Clearance dated 09/18/21 on chart  Cardiologist : DR Shirlee More LOV 09/05/21  Called and LVMM with Caryl Pina at [place] and requested clearance.  LOV 09/05/21 on chart.  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    Preop nurse instructed pt at preop appt to call Dr Veverly Fells and DR [facility] in regards to preop Plavix and Xarelto instructions.  PT voiced understanding.  PT had most current coronary stents placed 08/10/21  by DR [name].  See note of DR Bettina Gavia dated 09/05/21.  Janett Billow [facility] aware.

## 2021-11-13 ENCOUNTER — Other Ambulatory Visit (HOSPITAL_COMMUNITY): Payer: PPO

## 2021-11-14 ENCOUNTER — Encounter (HOSPITAL_COMMUNITY): Payer: Self-pay

## 2021-11-14 ENCOUNTER — Other Ambulatory Visit: Payer: Self-pay

## 2021-11-14 ENCOUNTER — Encounter (HOSPITAL_COMMUNITY)
Admission: RE | Admit: 2021-11-14 | Discharge: 2021-11-14 | Disposition: A | Discharge status: Home / Self Care | Payer: PPO | Source: Ambulatory Visit | Attending: Orthopedic Surgery | Admitting: Orthopedic Surgery

## 2021-11-14 VITALS — BP 130/96 | HR 81 | Temp 98.2°F | Resp 16 | Ht 67.0 in | Wt 207.0 lb

## 2021-11-14 DIAGNOSIS — K219 Gastro-esophageal reflux disease without esophagitis: Secondary | ICD-10-CM | POA: Diagnosis not present

## 2021-11-14 DIAGNOSIS — M19012 Primary osteoarthritis, left shoulder: Secondary | ICD-10-CM | POA: Insufficient documentation

## 2021-11-14 DIAGNOSIS — I4891 Unspecified atrial fibrillation: Secondary | ICD-10-CM | POA: Insufficient documentation

## 2021-11-14 DIAGNOSIS — Z87891 Personal history of nicotine dependence: Secondary | ICD-10-CM | POA: Diagnosis not present

## 2021-11-14 DIAGNOSIS — I110 Hypertensive heart disease with heart failure: Secondary | ICD-10-CM | POA: Insufficient documentation

## 2021-11-14 DIAGNOSIS — Z7901 Long term (current) use of anticoagulants: Secondary | ICD-10-CM | POA: Insufficient documentation

## 2021-11-14 DIAGNOSIS — Z01812 Encounter for preprocedural laboratory examination: Secondary | ICD-10-CM | POA: Insufficient documentation

## 2021-11-14 DIAGNOSIS — Z7902 Long term (current) use of antithrombotics/antiplatelets: Secondary | ICD-10-CM | POA: Insufficient documentation

## 2021-11-14 DIAGNOSIS — I2510 Atherosclerotic heart disease of native coronary artery without angina pectoris: Secondary | ICD-10-CM | POA: Insufficient documentation

## 2021-11-14 DIAGNOSIS — I509 Heart failure, unspecified: Secondary | ICD-10-CM | POA: Diagnosis not present

## 2021-11-14 DIAGNOSIS — Z01818 Encounter for other preprocedural examination: Secondary | ICD-10-CM

## 2021-11-14 HISTORY — DX: Dyspnea, unspecified: R06.00

## 2021-11-14 LAB — BASIC METABOLIC PANEL
Anion gap: 9 (ref 5–15)
BUN: 19 mg/dL (ref 8–23)
CO2: 28 mmol/L (ref 22–32)
Calcium: 9.3 mg/dL (ref 8.9–10.3)
Chloride: 103 mmol/L (ref 98–111)
Creatinine, Ser: 1.14 mg/dL (ref 0.61–1.24)
GFR, Estimated: >60 mL/min (ref >60)
Glucose, Bld: 157 mg/dL — ABNORMAL HIGH (ref 70–99)
Potassium: 4.9 mmol/L (ref 3.5–5.1)
Sodium: 140 mmol/L (ref 135–145)

## 2021-11-14 LAB — CBC
HCT: 44 % (ref 39.0–52.0)
Hemoglobin: 14.6 g/dL (ref 13.0–17.0)
MCH: 31.7 pg (ref 26.0–34.0)
MCHC: 33.2 g/dL (ref 30.0–36.0)
MCV: 95.4 fL (ref 80.0–100.0)
Platelets: 203 10*3/uL (ref 150–400)
RBC: 4.61 MIL/uL (ref 4.22–5.81)
RDW: 13.8 % (ref 11.5–15.5)
WBC: 6 10*3/uL (ref 4.0–10.5)
nRBC: 0 % (ref 0.0–0.2)

## 2021-11-14 LAB — SURGICAL PCR SCREEN
MRSA, PCR: NEGATIVE
Staphylococcus aureus: NEGATIVE

## 2021-11-15 ENCOUNTER — Telehealth: Payer: Self-pay | Admitting: Cardiology

## 2021-11-15 NOTE — Anesthesia Preprocedure Evaluation (Addendum)
Anesthesia Evaluation  Patient identified by MRN, date of birth, ID band Patient awake    Reviewed: Allergy & Precautions, NPO status , Patient's Chart, lab work & pertinent test results  Airway Mallampati: III  TM Distance: <3 FB Neck ROM: Full    Dental no notable dental hx.    Pulmonary neg pulmonary ROS, former smoker,    Pulmonary exam normal breath sounds clear to auscultation       Cardiovascular hypertension, + CAD, + Past MI, + Cardiac Stents and +CHF  Normal cardiovascular exam Rhythm:Regular Rate:Normal     Neuro/Psych negative neurological ROS  negative psych ROS   GI/Hepatic negative GI ROS, Neg liver ROS,   Endo/Other  negative endocrine ROS  Renal/GU negative Renal ROS  negative genitourinary   Musculoskeletal negative musculoskeletal ROS (+)   Abdominal   Peds negative pediatric ROS (+)  Hematology negative hematology ROS (+)   Anesthesia Other Findings   Reproductive/Obstetrics negative OB ROS                            Anesthesia Physical Anesthesia Plan  ASA: 3  Anesthesia Plan: General   Post-op Pain Management: Regional block*   Induction: Intravenous  PONV Risk Score and Plan: 2 and Ondansetron, Dexamethasone and Treatment may vary due to age or medical condition  Airway Management Planned: Oral ETT and Video Laryngoscope Planned  Additional Equipment:   Intra-op Plan:   Post-operative Plan: Extubation in OR  Informed Consent: I have reviewed the patients History and Physical, chart, labs and discussed the procedure including the risks, benefits and alternatives for the proposed anesthesia with the patient or authorized representative who has indicated his/her understanding and acceptance.     Dental advisory given  Plan Discussed with: CRNA and Surgeon  Anesthesia Plan Comments: (See PAT note 11/14/2021)       Anesthesia Quick Evaluation

## 2021-11-15 NOTE — Progress Notes (Signed)
Anesthesia Chart Review   Case: [id number] Date/Time: 11/23/21 4166   Procedure: REVERSE SHOULDER ARTHROPLASTY (Left: Shoulder)   Anesthesia type: General   Pre-op diagnosis: left shoulder osteoarthritis   Location: [facility] ROOM 06 / [facility]   Surgeons: [name], MD       DISCUSSION:81 y.o.former smoker with h/o HTN, GERD, CAD (stents 08/10/2021, stent 2018, DES 2016), paroxysmal SVT, atrial fibrillation, left shoulder OA scheduled for above procedure 11/23/2021 with Dr. [name].   Lumbar fusion L4-5 with hardware in place.   Pt last seen by cardiology 09/05/2021. S/p stents 08/10/2021.  Stable at this visit.  Per Dr. Bettina Gavia pt can hold Plavix 5 days prior to procedure and Xarelto 3 days prior to procedure.   Anticipate pt can proceed with planned procedure barring acute status change.   VS: BP (!) 130/96   Pulse 81   Temp 36.8 C (Oral)   Resp 16   Ht '5\' 7"'$  (1.702 m)   Wt 93.9 kg   SpO2 100%   BMI 32.42 kg/m   PROVIDERS: Cyndi Bender, PA-C  Cardiologist : DR Shirlee More LABS: Labs reviewed: Acceptable for surgery. (all labs ordered are listed, but only abnormal results are displayed)  Labs Reviewed  BASIC METABOLIC PANEL - Abnormal; Notable for the following components:      Result Value   Glucose, Bld 157 (*)    All other components within normal limits  SURGICAL PCR SCREEN  CBC     IMAGES:   EKG: 08/13/2021 Rate 92 bpm  Atrial fibrillation RBBB  CV: Echo 05/04/2019 1. Left ventricular ejection fraction, by visual estimation, is 45 to  50%. There is mildly increased left ventricular hypertrophy.   2. Left atrial size was normal.   3. Right atrial size was normal.   4. The mitral valve is normal in structure. No evidence of mitral valve  regurgitation. No evidence of mitral stenosis.   5. The tricuspid valve is normal in structure.   6. The aortic valve is normal in structure. Aortic valve regurgitation is  trivial. No evidence of aortic valve sclerosis  or stenosis.   7. The pulmonic valve was normal in structure. Pulmonic valve  regurgitation is not visualized.   8. Mildly elevated pulmonary artery systolic pressure.   9. The inferior vena cava is normal in size with greater than 50%  respiratory variability, suggesting right atrial pressure of 3 mmHg.   Myocardial Perfusion 03/25/2019 There was no ST segment deviation noted during stress. No T wave inversion was noted during stress. Nongated study due to atrial fibrillation. The study is normal. This is a low risk study.   Past Medical History:  Diagnosis Date   Anxiety    Arthritis    Atypical atrial flutter ([place]) 12/14/2014   Bradycardia, sinus 10/15/2014   Burning sensation of feet 01/17/2016   CAD, multiple vessel 10/18/2014   Overview:  DES to [facility] for unstable angina 10/17/14 Diagnostic Summary Severe stenosis of mid Cx likely cause of recent accelerating angina Diffuse severe stenosis of small caliber LAD unchanged from 2012 Normal LV function Paroxysmal SVT noted during procedure 04/23/16:  PCI / Resolute Drug Eluting Stent of the proximal Left Anterior Descending Coronary Artery. 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   IHD (ischemic heart disease) 10/15/2014   Lower limb pain, inferior, right 12/17/2017   Lumbar radiculopathy 02/21/2016   Myocardial infarction [facility]) 2003   NSTEMI (non-ST elevated myocardial infarction) ([place]) 04/24/2016   Overview:  LHC 04/24/15: Severe stenosis of the LAD & Diagonal bifurcation  LV ejection fraction is 55-60 %  Interventional Summary Successful Angiosculpt PCI / Resolute Drug Eluting Stent of the proximal LAD Successful PTCA of the ostial 1st Diagonal Coronary Artery   Paroxysmal SVT (supraventricular tachycardia) ([place]) 11/16/2014   Precordial pain 10/15/2014   PVC's (premature ventricular contractions) 04/22/2016   RBBB 10/15/2014   AFIB   S/P angioplasty with stent 08/10/21 with DES to mid OM1 and distal LCX-OM2 08/11/2021   S/P lumbar fusion 04/24/2017   Sacroiliac joint pain 09/10/2018   Sleep disturbances 01/17/2016   Snoring 01/17/2016   Somatic dysfunction of left sacroiliac joint 06/19/2018   Subscapularis (muscle) sprain 03/29/2011   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    Past Surgical History:  Procedure Laterality Date   BIOPSY  04/24/2020   Procedure: BIOPSY;  Surgeon: Jerene Bears, MD;  Location: [place];  Service: Gastroenterology;;   CARDIAC CATHETERIZATION  10-2010   ptca/[place] in [place]   [place]  04/23/2016   COLONOSCOPY WITH PROPOFOL N/A 04/24/2020   Procedure: COLONOSCOPY WITH PROPOFOL;  Surgeon: Jerene Bears, MD;  Location: [place];  Service: Gastroenterology;  Laterality: N/A;   CORONARY ANGIOPLASTY      CORONARY STENT INTERVENTION N/A 08/10/2021   Procedure: CORONARY STENT INTERVENTION;  Surgeon: Leonie Man, MD;  Location: [place] [facility];  Service: Cardiovascular;  Laterality: N/A;   ESOPHAGOGASTRODUODENOSCOPY (EGD) WITH PROPOFOL N/A 04/24/2020   Procedure: ESOPHAGOGASTRODUODENOSCOPY (EGD) WITH PROPOFOL;  Surgeon: Jerene Bears, MD;  Location: [place];  Service: Gastroenterology;  Laterality: N/A;   EYE SURGERY  05-1010   bil cataract ,iol   FRACTURE SURGERY     HEMOSTASIS CONTROL  04/24/2020   Procedure: HEMOSTASIS CONTROL;  Surgeon: Jerene Bears, MD;  Location: [facility] ENDOSCOPY;  Service: Gastroenterology;;   I & D EXTREMITY Right 02/23/2015   Procedure: RIGHT RING FINGER REVISION AMPUTATION;  Surgeon: Leanora Cover, MD;  Location: [place];  Service: Orthopedics;  Laterality: Right;   KNEE ARTHROSCOPY W/ MENISCAL REPAIR  2000   left knee   LEFT HEART CATH AND CORONARY ANGIOGRAPHY N/A 08/10/2021   Procedure: LEFT HEART CATH AND CORONARY ANGIOGRAPHY;  Surgeon: Leonie Man, MD;  Location: [place] [facility];  Service: Cardiovascular;  Laterality: N/A;   LUMBAR EPIDURAL INJECTION  2011   low  back pain; DDD   POLYPECTOMY  04/24/2020   Procedure: POLYPECTOMY;  Surgeon: Jerene Bears, MD;  [facility];  Service: Gastroenterology;;   ring finger tramatic qamputation Right    SHOULDER ARTHROSCOPY  03/29/2011   Procedure: ARTHROSCOPY SHOULDER;  Surgeon: Augustin Schooling;  Location: [place];  Service: Orthopedics;  Laterality: Right;  RIGHT SHOULDER ARTHROSCOPY WITH OPEN SUBSCAPULAR REPAIR   SHOULDER OPEN ROTATOR CUFF REPAIR     3 on rt shoulder; 2 on left   TRANSFORAMINAL LUMBAR INTERBODY FUSION (TLIF) WITH PEDICLE SCREW FIXATION 1 LEVEL N/A 04/24/2017   Procedure: TRANSFORAMINAL LUMBAR INTERBODY FUSION (TLIF) L4-5;  Surgeon: Melina Schools, MD;  Location: [place];  Service: Orthopedics;  Laterality: N/A;  4 hrs    MEDICATIONS:  acetaminophen (TYLENOL) 325 MG tablet   Ascorbic Acid  (VITAMIN C) 1000 MG tablet   clopidogrel (PLAVIX) 75 MG tablet   CO-ENZYME Q-10 PO   Cyanocobalamin (B-12 PO)   digoxin (LANOXIN) 0.25 MG tablet   DULoxetine (CYMBALTA) 30 MG capsule   ezetimibe (ZETIA) 10 MG tablet   finasteride (PROSCAR) 5 MG tablet   Flaxseed, Linseed, (FLAXSEED OIL MAX STR) 1300 MG CAPS   furosemide (LASIX) 80 MG tablet   gemfibrozil (LOPID) 600 MG tablet   Glucosamine HCl (GLUCOSAMINE PO)   isosorbide mononitrate (IMDUR) 60 MG 24 hr tablet   metoprolol succinate (TOPROL-XL) 25 MG 24 hr tablet   minocycline (MINOCIN,DYNACIN) 50 MG capsule   Multiple Vitamins-Minerals (MULTIVITAMINS THER. W/MINERALS) TABS   nitroGLYCERIN (NITROSTAT) 0.4 MG SL tablet   Omega-3 Fatty Acids (FISH OIL PO)   pantoprazole (PROTONIX) 40 MG tablet   potassium chloride SA (KLOR-CON M20) 20 MEQ tablet   PUMPKIN SEED PO   rivaroxaban (XARELTO) 20 MG TABS tablet   simvastatin (ZOCOR) 40 MG tablet   tamsulosin (FLOMAX) 0.4 MG CAPS capsule   traZODone (DESYREL) 50 MG tablet   TURMERIC CURCUMIN PO   No current facility-administered medications for this encounter.    Konrad Felix Ward, PA-C [facility]-Surgical [facility] [phone]

## 2021-11-15 NOTE — Telephone Encounter (Signed)
I s/w the pt's wife, ok per the pt. I have advised of medication instructions for both Plavix and Xarelto after I d/w pre op provider Diona Browner, NP today.  Pt wife verbalized instructions:   Hold Plavix x 5 days prior to surgery; last day to take Plavix will be Saturday 11/17/21  Hold Xarelto x 3 days prior to surgery, last day to take Xarelto will be 11/19/21.   Pt will resume both medications once felt safe per the surgeon.

## 2021-11-15 NOTE — Telephone Encounter (Signed)
Spouse calling to F/U on Clearance as to when pt needs to stop Plavix and Xarelto due to upcoming surgery on 11/23/21. Please advise

## 2021-11-22 ENCOUNTER — Other Ambulatory Visit: Payer: Self-pay

## 2021-11-23 ENCOUNTER — Encounter (HOSPITAL_COMMUNITY)
Admission: RE | Disposition: A | Discharge status: Home / Self Care | Payer: Self-pay | Source: Ambulatory Visit | Attending: Orthopedic Surgery

## 2021-11-23 ENCOUNTER — Observation Stay (HOSPITAL_COMMUNITY): Payer: PPO

## 2021-11-23 ENCOUNTER — Other Ambulatory Visit: Payer: Self-pay

## 2021-11-23 ENCOUNTER — Encounter (HOSPITAL_COMMUNITY): Payer: Self-pay | Admitting: Orthopedic Surgery

## 2021-11-23 ENCOUNTER — Ambulatory Visit (HOSPITAL_BASED_OUTPATIENT_CLINIC_OR_DEPARTMENT_OTHER): Payer: PPO | Admitting: Anesthesiology

## 2021-11-23 ENCOUNTER — Observation Stay (HOSPITAL_COMMUNITY)
Admission: RE | Admit: 2021-11-23 | Discharge: 2021-11-24 | Disposition: A | Discharge status: Home / Self Care | Payer: PPO | Source: Ambulatory Visit | Attending: Orthopedic Surgery | Admitting: Orthopedic Surgery

## 2021-11-23 ENCOUNTER — Ambulatory Visit (HOSPITAL_COMMUNITY): Payer: PPO | Admitting: Physician Assistant

## 2021-11-23 DIAGNOSIS — I509 Heart failure, unspecified: Secondary | ICD-10-CM | POA: Insufficient documentation

## 2021-11-23 DIAGNOSIS — Z955 Presence of coronary angioplasty implant and graft: Secondary | ICD-10-CM | POA: Insufficient documentation

## 2021-11-23 DIAGNOSIS — M75102 Unspecified rotator cuff tear or rupture of left shoulder, not specified as traumatic: Secondary | ICD-10-CM | POA: Diagnosis not present

## 2021-11-23 DIAGNOSIS — Z7902 Long term (current) use of antithrombotics/antiplatelets: Secondary | ICD-10-CM | POA: Diagnosis not present

## 2021-11-23 DIAGNOSIS — I2510 Atherosclerotic heart disease of native coronary artery without angina pectoris: Secondary | ICD-10-CM | POA: Diagnosis not present

## 2021-11-23 DIAGNOSIS — I4891 Unspecified atrial fibrillation: Secondary | ICD-10-CM | POA: Insufficient documentation

## 2021-11-23 DIAGNOSIS — Z79899 Other long term (current) drug therapy: Secondary | ICD-10-CM | POA: Diagnosis not present

## 2021-11-23 DIAGNOSIS — Z87891 Personal history of nicotine dependence: Secondary | ICD-10-CM | POA: Diagnosis not present

## 2021-11-23 DIAGNOSIS — I252 Old myocardial infarction: Secondary | ICD-10-CM

## 2021-11-23 DIAGNOSIS — M75122 Complete rotator cuff tear or rupture of left shoulder, not specified as traumatic: Secondary | ICD-10-CM | POA: Diagnosis not present

## 2021-11-23 DIAGNOSIS — I110 Hypertensive heart disease with heart failure: Secondary | ICD-10-CM | POA: Insufficient documentation

## 2021-11-23 DIAGNOSIS — M19012 Primary osteoarthritis, left shoulder: Principal | ICD-10-CM | POA: Insufficient documentation

## 2021-11-23 DIAGNOSIS — Z9889 Other specified postprocedural states: Secondary | ICD-10-CM | POA: Diagnosis not present

## 2021-11-23 DIAGNOSIS — J439 Emphysema, unspecified: Secondary | ICD-10-CM | POA: Diagnosis not present

## 2021-11-23 DIAGNOSIS — Z01818 Encounter for other preprocedural examination: Secondary | ICD-10-CM

## 2021-11-23 DIAGNOSIS — Z471 Aftercare following joint replacement surgery: Secondary | ICD-10-CM | POA: Diagnosis not present

## 2021-11-23 DIAGNOSIS — Z7901 Long term (current) use of anticoagulants: Secondary | ICD-10-CM | POA: Diagnosis not present

## 2021-11-23 DIAGNOSIS — Z96612 Presence of left artificial shoulder joint: Secondary | ICD-10-CM | POA: Diagnosis not present

## 2021-11-23 DIAGNOSIS — G8918 Other acute postprocedural pain: Secondary | ICD-10-CM | POA: Diagnosis not present

## 2021-11-23 HISTORY — DX: Presence of left artificial shoulder joint: Z96.612

## 2021-11-23 HISTORY — PX: REVERSE SHOULDER ARTHROPLASTY: SHX5054

## 2021-11-23 SURGERY — ARTHROPLASTY, SHOULDER, TOTAL, REVERSE
Anesthesia: General | Site: Shoulder | Laterality: Left

## 2021-11-23 MED ORDER — METOPROLOL SUCCINATE ER 25 MG PO TB24
25.0000 mg | ORAL_TABLET | Freq: Two times a day (BID) | ORAL | Status: DC
  Administered 2021-11-23 – 2021-11-24 (×2): 25 mg via ORAL
  Filled 2021-11-23 (×2): qty 1

## 2021-11-23 MED ORDER — CLOPIDOGREL BISULFATE 75 MG PO TABS
75.0000 mg | ORAL_TABLET | Freq: Every day | ORAL | Status: DC
  Administered 2021-11-24: 75 mg via ORAL
  Filled 2021-11-23: qty 1

## 2021-11-23 MED ORDER — LACTATED RINGERS IV SOLN: INTRAVENOUS | Status: DC

## 2021-11-23 MED ORDER — PHENYLEPHRINE 80 MCG/ML (10ML) SYRINGE FOR IV PUSH (FOR BLOOD PRESSURE SUPPORT)
PREFILLED_SYRINGE | INTRAVENOUS | Status: AC
  Filled 2021-11-23: qty 20

## 2021-11-23 MED ORDER — PHENYLEPHRINE HCL-NACL 20-0.9 MG/250ML-% IV SOLN
INTRAVENOUS | Status: DC | PRN
  Administered 2021-11-23: 40 ug/min via INTRAVENOUS

## 2021-11-23 MED ORDER — PHENYLEPHRINE HCL (PRESSORS) 10 MG/ML IV SOLN
INTRAVENOUS | Status: AC
  Filled 2021-11-23: qty 1

## 2021-11-23 MED ORDER — CEFAZOLIN SODIUM-DEXTROSE 2-4 GM/100ML-% IV SOLN
2.0000 g | INTRAVENOUS | Status: AC
  Administered 2021-11-23: 2 g via INTRAVENOUS
  Filled 2021-11-23: qty 100

## 2021-11-23 MED ORDER — GLUCOSAMINE 500 MG PO CAPS: ORAL_CAPSULE | Freq: Two times a day (BID) | ORAL | Status: DC

## 2021-11-23 MED ORDER — PHENYLEPHRINE 80 MCG/ML (10ML) SYRINGE FOR IV PUSH (FOR BLOOD PRESSURE SUPPORT)
PREFILLED_SYRINGE | INTRAVENOUS | Status: AC
Start: 2021-11-23 — End: ?
  Filled 2021-11-23: qty 20

## 2021-11-23 MED ORDER — PHENOL 1.4 % MT LIQD
1.0000 | OROMUCOSAL | Status: DC | PRN
Start: 2021-11-23 — End: 2021-11-24

## 2021-11-23 MED ORDER — BUPIVACAINE HCL (PF) 0.5 % IJ SOLN
INTRAMUSCULAR | Status: DC | PRN
  Administered 2021-11-23: 15 mL via PERINEURAL

## 2021-11-23 MED ORDER — ONDANSETRON HCL 4 MG/2ML IJ SOLN
INTRAMUSCULAR | Status: DC | PRN
  Administered 2021-11-23: 4 mg via INTRAVENOUS

## 2021-11-23 MED ORDER — PANTOPRAZOLE SODIUM 40 MG PO TBEC
40.0000 mg | DELAYED_RELEASE_TABLET | Freq: Every day | ORAL | Status: DC
  Administered 2021-11-24: 40 mg via ORAL
  Filled 2021-11-23: qty 1

## 2021-11-23 MED ORDER — ORAL CARE MOUTH RINSE: 15.0000 mL | Freq: Once | OROMUCOSAL | Status: AC

## 2021-11-23 MED ORDER — MINOCYCLINE HCL 50 MG PO CAPS
50.0000 mg | ORAL_CAPSULE | Freq: Every day | ORAL | Status: DC
  Administered 2021-11-24: 50 mg via ORAL
  Filled 2021-11-23: qty 1

## 2021-11-23 MED ORDER — METHOCARBAMOL 1000 MG/10ML IJ SOLN: 500.0000 mg | Freq: Four times a day (QID) | INTRAVENOUS | Status: DC | PRN

## 2021-11-23 MED ORDER — PROPOFOL 10 MG/ML IV BOLUS
INTRAVENOUS | Status: DC | PRN
  Administered 2021-11-23: 120 mg via INTRAVENOUS

## 2021-11-23 MED ORDER — ADULT MULTIVITAMIN W/MINERALS CH
1.0000 | ORAL_TABLET | Freq: Every day | ORAL | Status: DC
Start: 2021-11-24 — End: 2021-11-24
  Administered 2021-11-24: 1 via ORAL
  Filled 2021-11-23: qty 1

## 2021-11-23 MED ORDER — FINASTERIDE 5 MG PO TABS
5.0000 mg | ORAL_TABLET | Freq: Every day | ORAL | Status: DC
  Administered 2021-11-24: 5 mg via ORAL
  Filled 2021-11-23: qty 1

## 2021-11-23 MED ORDER — DIGOXIN 250 MCG PO TABS
250.0000 ug | ORAL_TABLET | Freq: Every day | ORAL | Status: DC
  Administered 2021-11-24: 250 ug via ORAL
  Filled 2021-11-23: qty 1

## 2021-11-23 MED ORDER — PROPOFOL 10 MG/ML IV BOLUS
INTRAVENOUS | Status: AC
  Filled 2021-11-23: qty 20

## 2021-11-23 MED ORDER — ACETAMINOPHEN 325 MG PO TABS: 325.0000 mg | ORAL_TABLET | Freq: Four times a day (QID) | ORAL | Status: DC | PRN

## 2021-11-23 MED ORDER — ROCURONIUM BROMIDE 10 MG/ML (PF) SYRINGE
PREFILLED_SYRINGE | INTRAVENOUS | Status: DC | PRN
  Administered 2021-11-23: 80 mg via INTRAVENOUS

## 2021-11-23 MED ORDER — BUPIVACAINE-EPINEPHRINE (PF) 0.25% -1:200000 IJ SOLN
INTRAMUSCULAR | Status: AC
  Filled 2021-11-23: qty 30

## 2021-11-23 MED ORDER — RIVAROXABAN 10 MG PO TABS
20.0000 mg | ORAL_TABLET | Freq: Every day | ORAL | Status: DC
  Administered 2021-11-24: 20 mg via ORAL
  Filled 2021-11-23: qty 2

## 2021-11-23 MED ORDER — MIDAZOLAM HCL 2 MG/2ML IJ SOLN
1.0000 mg | Freq: Once | INTRAMUSCULAR | Status: DC
  Filled 2021-11-23: qty 2

## 2021-11-23 MED ORDER — ASCORBIC ACID 500 MG PO TABS
1000.0000 mg | ORAL_TABLET | Freq: Every day | ORAL | Status: DC
  Administered 2021-11-24: 1000 mg via ORAL
  Filled 2021-11-23: qty 2

## 2021-11-23 MED ORDER — GEMFIBROZIL 600 MG PO TABS
300.0000 mg | ORAL_TABLET | Freq: Two times a day (BID) | ORAL | Status: DC
  Administered 2021-11-24: 300 mg via ORAL
  Filled 2021-11-23: qty 0.5

## 2021-11-23 MED ORDER — LIDOCAINE HCL (CARDIAC) PF 100 MG/5ML IV SOSY
PREFILLED_SYRINGE | INTRAVENOUS | Status: DC | PRN
  Administered 2021-11-23: 100 mg via INTRAVENOUS

## 2021-11-23 MED ORDER — POTASSIUM CHLORIDE CRYS ER 20 MEQ PO TBCR
40.0000 meq | EXTENDED_RELEASE_TABLET | Freq: Two times a day (BID) | ORAL | Status: DC
  Administered 2021-11-23 – 2021-11-24 (×2): 40 meq via ORAL
  Filled 2021-11-23 (×2): qty 2

## 2021-11-23 MED ORDER — POLYETHYLENE GLYCOL 3350 17 G PO PACK: 17.0000 g | PACK | Freq: Every day | ORAL | Status: DC | PRN

## 2021-11-23 MED ORDER — FLAXSEED OIL MAX STR 1300 MG PO CAPS: 1300.0000 mg | ORAL_CAPSULE | Freq: Every day | ORAL | Status: DC

## 2021-11-23 MED ORDER — FENTANYL CITRATE PF 50 MCG/ML IJ SOSY
50.0000 ug | PREFILLED_SYRINGE | Freq: Once | INTRAMUSCULAR | Status: AC
  Administered 2021-11-23: 50 ug via INTRAVENOUS
  Filled 2021-11-23: qty 2

## 2021-11-23 MED ORDER — ROCURONIUM BROMIDE 10 MG/ML (PF) SYRINGE
PREFILLED_SYRINGE | INTRAVENOUS | Status: AC
  Filled 2021-11-23: qty 10

## 2021-11-23 MED ORDER — ISOSORBIDE MONONITRATE ER 60 MG PO TB24
60.0000 mg | ORAL_TABLET | Freq: Every day | ORAL | Status: DC
  Administered 2021-11-24: 60 mg via ORAL
  Filled 2021-11-23: qty 1

## 2021-11-23 MED ORDER — METOCLOPRAMIDE HCL 5 MG/ML IJ SOLN: 5.0000 mg | Freq: Three times a day (TID) | INTRAMUSCULAR | Status: DC | PRN

## 2021-11-23 MED ORDER — DEXAMETHASONE SODIUM PHOSPHATE 10 MG/ML IJ SOLN
INTRAMUSCULAR | Status: AC
Start: 2021-11-23 — End: ?
  Filled 2021-11-23: qty 1

## 2021-11-23 MED ORDER — HYDROMORPHONE HCL 1 MG/ML IJ SOLN
0.5000 mg | INTRAMUSCULAR | Status: DC | PRN
  Administered 2021-11-23 – 2021-11-24 (×3): 1 mg via INTRAVENOUS
  Filled 2021-11-23 (×3): qty 1

## 2021-11-23 MED ORDER — LIDOCAINE HCL (PF) 2 % IJ SOLN
INTRAMUSCULAR | Status: AC
  Filled 2021-11-23: qty 5

## 2021-11-23 MED ORDER — EPHEDRINE SULFATE-NACL 50-0.9 MG/10ML-% IV SOSY
PREFILLED_SYRINGE | INTRAVENOUS | Status: DC | PRN
  Administered 2021-11-23: 10 mg via INTRAVENOUS
  Administered 2021-11-23 (×3): 5 mg via INTRAVENOUS

## 2021-11-23 MED ORDER — SODIUM CHLORIDE 0.9 % IR SOLN
Status: DC | PRN
  Administered 2021-11-23: 1000 mL

## 2021-11-23 MED ORDER — DULOXETINE HCL 30 MG PO CPEP
30.0000 mg | ORAL_CAPSULE | Freq: Two times a day (BID) | ORAL | Status: DC
  Administered 2021-11-23 – 2021-11-24 (×2): 30 mg via ORAL
  Filled 2021-11-23 (×2): qty 1

## 2021-11-23 MED ORDER — ACETAMINOPHEN 325 MG PO TABS: 650.0000 mg | ORAL_TABLET | Freq: Four times a day (QID) | ORAL | Status: DC | PRN

## 2021-11-23 MED ORDER — PHENYLEPHRINE HCL-NACL 20-0.9 MG/250ML-% IV SOLN
INTRAVENOUS | Status: AC
  Filled 2021-11-23: qty 500

## 2021-11-23 MED ORDER — METOCLOPRAMIDE HCL 5 MG PO TABS: 5.0000 mg | ORAL_TABLET | Freq: Three times a day (TID) | ORAL | Status: DC | PRN

## 2021-11-23 MED ORDER — CHLORHEXIDINE GLUCONATE 0.12 % MT SOLN
15.0000 mL | Freq: Once | OROMUCOSAL | Status: AC
  Administered 2021-11-23: 15 mL via OROMUCOSAL

## 2021-11-23 MED ORDER — ONDANSETRON HCL 4 MG/2ML IJ SOLN: 4.0000 mg | Freq: Four times a day (QID) | INTRAMUSCULAR | Status: DC | PRN

## 2021-11-23 MED ORDER — BUPIVACAINE-EPINEPHRINE (PF) 0.25% -1:200000 IJ SOLN
INTRAMUSCULAR | Status: DC | PRN
  Administered 2021-11-23: 14 mL

## 2021-11-23 MED ORDER — ONDANSETRON HCL 4 MG/2ML IJ SOLN
INTRAMUSCULAR | Status: AC
  Filled 2021-11-23: qty 2

## 2021-11-23 MED ORDER — ACETAMINOPHEN 10 MG/ML IV SOLN: 1000.0000 mg | Freq: Once | INTRAVENOUS | Status: DC | PRN

## 2021-11-23 MED ORDER — SIMVASTATIN 40 MG PO TABS
40.0000 mg | ORAL_TABLET | Freq: Every day | ORAL | Status: DC
  Administered 2021-11-23: 40 mg via ORAL
  Filled 2021-11-23: qty 1

## 2021-11-23 MED ORDER — CO-ENZYME Q-10 30 MG PO CAPS: 50.0000 mg | ORAL_CAPSULE | Freq: Every day | ORAL | Status: DC

## 2021-11-23 MED ORDER — PUMPKIN SEED 500 MG PO TABS: ORAL_TABLET | Freq: Every day | ORAL | Status: DC

## 2021-11-23 MED ORDER — VITAMIN B-12 100 MCG PO TABS
250.0000 ug | ORAL_TABLET | Freq: Every day | ORAL | Status: DC
  Administered 2021-11-24: 250 ug via ORAL
  Filled 2021-11-23: qty 3

## 2021-11-23 MED ORDER — MENTHOL 3 MG MT LOZG
1.0000 | LOZENGE | OROMUCOSAL | Status: DC | PRN
Start: 2021-11-23 — End: 2021-11-24

## 2021-11-23 MED ORDER — TRAZODONE HCL 50 MG PO TABS
50.0000 mg | ORAL_TABLET | Freq: Every day | ORAL | Status: DC
  Administered 2021-11-23: 50 mg via ORAL
  Filled 2021-11-23: qty 1

## 2021-11-23 MED ORDER — METHOCARBAMOL 500 MG PO TABS: 500.0000 mg | ORAL_TABLET | Freq: Four times a day (QID) | ORAL | 1 refills | Status: DC | PRN

## 2021-11-23 MED ORDER — PHENYLEPHRINE HCL (PRESSORS) 10 MG/ML IV SOLN
INTRAVENOUS | Status: DC | PRN
  Administered 2021-11-23: 240 ug via INTRAVENOUS
  Administered 2021-11-23 (×4): 160 ug via INTRAVENOUS
  Administered 2021-11-23: 240 ug via INTRAVENOUS

## 2021-11-23 MED ORDER — OMEGA-3-ACID ETHYL ESTERS 1 G PO CAPS
1.0000 g | ORAL_CAPSULE | Freq: Every day | ORAL | Status: DC
  Administered 2021-11-24: 1 g via ORAL
  Filled 2021-11-23: qty 1

## 2021-11-23 MED ORDER — EZETIMIBE 10 MG PO TABS
10.0000 mg | ORAL_TABLET | Freq: Every evening | ORAL | Status: DC
  Administered 2021-11-23: 10 mg via ORAL
  Filled 2021-11-23: qty 1

## 2021-11-23 MED ORDER — TRAMADOL HCL 50 MG PO TABS
100.0000 mg | ORAL_TABLET | Freq: Four times a day (QID) | ORAL | Status: DC | PRN
  Administered 2021-11-23 – 2021-11-24 (×2): 100 mg via ORAL
  Filled 2021-11-23 (×2): qty 2

## 2021-11-23 MED ORDER — EPHEDRINE 5 MG/ML INJ
INTRAVENOUS | Status: AC
  Filled 2021-11-23: qty 5

## 2021-11-23 MED ORDER — ONDANSETRON HCL 4 MG PO TABS: 4.0000 mg | ORAL_TABLET | Freq: Four times a day (QID) | ORAL | Status: DC | PRN

## 2021-11-23 MED ORDER — CEFAZOLIN SODIUM-DEXTROSE 2-4 GM/100ML-% IV SOLN
2.0000 g | Freq: Four times a day (QID) | INTRAVENOUS | Status: AC
  Administered 2021-11-23 – 2021-11-24 (×3): 2 g via INTRAVENOUS
  Filled 2021-11-23 (×3): qty 100

## 2021-11-23 MED ORDER — NITROGLYCERIN 0.4 MG SL SUBL
0.4000 mg | SUBLINGUAL_TABLET | SUBLINGUAL | Status: DC | PRN
Start: 2021-11-23 — End: 2021-11-24

## 2021-11-23 MED ORDER — METHOCARBAMOL 500 MG PO TABS
500.0000 mg | ORAL_TABLET | Freq: Four times a day (QID) | ORAL | Status: DC | PRN
  Administered 2021-11-23: 500 mg via ORAL
  Filled 2021-11-23: qty 1

## 2021-11-23 MED ORDER — DOCUSATE SODIUM 100 MG PO CAPS
100.0000 mg | ORAL_CAPSULE | Freq: Two times a day (BID) | ORAL | Status: DC
Start: 2021-11-23 — End: 2021-11-24
  Administered 2021-11-23 – 2021-11-24 (×2): 100 mg via ORAL
  Filled 2021-11-23 (×2): qty 1

## 2021-11-23 MED ORDER — SODIUM CHLORIDE 0.9 % IV SOLN: INTRAVENOUS | Status: DC

## 2021-11-23 MED ORDER — ONDANSETRON HCL 4 MG/2ML IJ SOLN: 4.0000 mg | Freq: Once | INTRAMUSCULAR | Status: DC | PRN

## 2021-11-23 MED ORDER — TURMERIC CURCUMIN PO CAPS: ORAL_CAPSULE | Freq: Every day | ORAL | Status: DC

## 2021-11-23 MED ORDER — DEXAMETHASONE SODIUM PHOSPHATE 10 MG/ML IJ SOLN
INTRAMUSCULAR | Status: DC | PRN
  Administered 2021-11-23: 8 mg via INTRAVENOUS

## 2021-11-23 MED ORDER — TAMSULOSIN HCL 0.4 MG PO CAPS
0.4000 mg | ORAL_CAPSULE | Freq: Every day | ORAL | Status: DC
  Administered 2021-11-23 – 2021-11-24 (×2): 0.4 mg via ORAL
  Filled 2021-11-23 (×2): qty 1

## 2021-11-23 MED ORDER — TRAMADOL HCL 50 MG PO TABS: 100.0000 mg | ORAL_TABLET | Freq: Four times a day (QID) | ORAL | 0 refills | Status: AC | PRN

## 2021-11-23 MED ORDER — HYDROMORPHONE HCL 1 MG/ML IJ SOLN: 0.2500 mg | INTRAMUSCULAR | Status: DC | PRN

## 2021-11-23 MED ORDER — BUPIVACAINE LIPOSOME 1.3 % IJ SUSP
INTRAMUSCULAR | Status: DC | PRN
  Administered 2021-11-23: 10 mL via PERINEURAL

## 2021-11-23 MED ORDER — SUGAMMADEX SODIUM 200 MG/2ML IV SOLN
INTRAVENOUS | Status: DC | PRN
  Administered 2021-11-23: 200 mg via INTRAVENOUS

## 2021-11-23 SURGICAL SUPPLY — 74 items
BAG COUNTER SPONGE SURGICOUNT (BAG) ×1 IMPLANT
BAG ZIPLOCK 12X15 (MISCELLANEOUS) IMPLANT
BIT DRILL 1.6MX128 (BIT) IMPLANT
BIT DRILL 170X2.5X (BIT) IMPLANT
BIT DRL 170X2.5X (BIT) ×1
BLADE SAG 18X100X1.27 (BLADE) ×2 IMPLANT
COOLER ICEMAN CLASSIC (MISCELLANEOUS) ×1 IMPLANT
COVER BACK TABLE 60X90IN (DRAPES) ×2 IMPLANT
COVER SURGICAL LIGHT HANDLE (MISCELLANEOUS) ×2 IMPLANT
CUP HUMERAL 42 PLUS 3 (Orthopedic Implant) ×1 IMPLANT
DRAPE INCISE IOBAN 66X45 STRL (DRAPES) ×2 IMPLANT
DRAPE ORTHO SPLIT 77X108 STRL (DRAPES) ×2
DRAPE SHEET LG 3/4 BI-LAMINATE (DRAPES) ×2 IMPLANT
DRAPE SURG ORHT 6 SPLT 77X108 (DRAPES) ×2 IMPLANT
DRAPE TOP 10253 STERILE (DRAPES) ×2 IMPLANT
DRAPE U-SHAPE 47X51 STRL (DRAPES) ×2 IMPLANT
DRILL 2.5 (BIT) ×1
DRSG ADAPTIC 3X8 NADH LF (GAUZE/BANDAGES/DRESSINGS) ×2 IMPLANT
DRSG PAD ABDOMINAL 8X10 ST (GAUZE/BANDAGES/DRESSINGS) ×2 IMPLANT
DURAPREP 26ML APPLICATOR (WOUND CARE) ×2 IMPLANT
ELECT BLADE TIP CTD 4 INCH (ELECTRODE) ×2 IMPLANT
ELECT NDL TIP 2.8 STRL (NEEDLE) ×1 IMPLANT
ELECT NEEDLE TIP 2.8 STRL (NEEDLE) ×2 IMPLANT
ELECT REM PT RETURN 15FT ADLT (MISCELLANEOUS) ×2 IMPLANT
EPIPHSYSI CENTER SZ 2 LT (Shoulder) ×2 IMPLANT
EPIPHYSIS CENTER SZ 2 LT (Shoulder) IMPLANT
FACESHIELD WRAPAROUND (MASK) ×2 IMPLANT
FACESHIELD WRAPAROUND OR TEAM (MASK) ×1 IMPLANT
GAUZE SPONGE 4X4 12PLY STRL (GAUZE/BANDAGES/DRESSINGS) ×2 IMPLANT
GLENOSPHERE XTEND LAT 42+0 STD (Miscellaneous) ×1 IMPLANT
GLOVE BIOGEL PI IND STRL 7.5 (GLOVE) ×1 IMPLANT
GLOVE BIOGEL PI IND STRL 8.5 (GLOVE) ×1 IMPLANT
GLOVE BIOGEL PI INDICATOR 7.5 (GLOVE) ×1
GLOVE BIOGEL PI INDICATOR 8.5 (GLOVE) ×1
GLOVE ORTHO TXT STRL SZ7.5 (GLOVE) ×2 IMPLANT
GLOVE SURG ORTHO 8.5 STRL (GLOVE) ×2 IMPLANT
GOWN STRL REUS W/ TWL XL LVL3 (GOWN DISPOSABLE) ×2 IMPLANT
GOWN STRL REUS W/TWL XL LVL3 (GOWN DISPOSABLE) ×2
KIT BASIN OR (CUSTOM PROCEDURE TRAY) ×2 IMPLANT
KIT TURNOVER KIT A (KITS) ×1 IMPLANT
MANIFOLD NEPTUNE II (INSTRUMENTS) ×2 IMPLANT
METAGLENE DELTA EXTEND (Trauma) IMPLANT
METAGLENE DXTEND (Trauma) ×2 IMPLANT
NDL MAYO CATGUT SZ4 TPR NDL (NEEDLE) ×1 IMPLANT
NEEDLE MAYO CATGUT SZ4 (NEEDLE) ×2 IMPLANT
NS IRRIG 1000ML POUR BTL (IV SOLUTION) ×2 IMPLANT
PACK SHOULDER (CUSTOM PROCEDURE TRAY) ×2 IMPLANT
PAD COLD SHLDR WRAP-ON (PAD) ×1 IMPLANT
PIN GUIDE 1.2 (PIN) ×1 IMPLANT
PIN GUIDE GLENOPHERE 1.5MX300M (PIN) ×1 IMPLANT
PIN METAGLENE 2.5 (PIN) ×1 IMPLANT
PROTECTOR NERVE ULNAR (MISCELLANEOUS) ×2 IMPLANT
RESTRAINT HEAD UNIVERSAL NS (MISCELLANEOUS) ×2 IMPLANT
SCREW 4.5X18MM (Screw) ×1 IMPLANT
SCREW 4.5X36MM (Screw) ×1 IMPLANT
SCREW BN 18X4.5XSTRL SHLDR (Screw) IMPLANT
SCREW LOCK 42 (Screw) ×1 IMPLANT
SLING ARM FOAM STRAP LRG (SOFTGOODS) ×1 IMPLANT
SMARTMIX MINI TOWER (MISCELLANEOUS)
SPIKE FLUID TRANSFER (MISCELLANEOUS) ×2 IMPLANT
SPONGE T-LAP 4X18 ~~LOC~~+RFID (SPONGE) ×2 IMPLANT
STEM 12 HA (Stem) ×1 IMPLANT
STRIP CLOSURE SKIN 1/2X4 (GAUZE/BANDAGES/DRESSINGS) ×2 IMPLANT
SUCTION FRAZIER HANDLE 10FR (MISCELLANEOUS) ×1
SUCTION TUBE FRAZIER 10FR DISP (MISCELLANEOUS) ×1 IMPLANT
SUT FIBERWIRE #2 38 T-5 BLUE (SUTURE) ×4
SUT MNCRL AB 4-0 PS2 18 (SUTURE) ×2 IMPLANT
SUT VIC AB 0 CT1 36 (SUTURE) ×4 IMPLANT
SUT VIC AB 0 CT2 27 (SUTURE) ×2 IMPLANT
SUT VIC AB 2-0 CT1 27 (SUTURE) ×1
SUT VIC AB 2-0 CT1 TAPERPNT 27 (SUTURE) ×1 IMPLANT
SUTURE FIBERWR #2 38 T-5 BLUE (SUTURE) ×2 IMPLANT
TOWEL OR 17X26 10 PK STRL BLUE (TOWEL DISPOSABLE) ×2 IMPLANT
TOWER SMARTMIX MINI (MISCELLANEOUS) IMPLANT

## 2021-11-23 NOTE — Anesthesia Procedure Notes (Signed)
Anesthesia Regional Block: Interscalene brachial plexus block   Pre-Anesthetic Checklist: , timeout performed,  Correct Patient, Correct Site, Correct Laterality,  Correct Procedure, Correct Position, site marked,  Risks and benefits discussed,  Surgical consent,  Pre-op evaluation,  At surgeon's request and post-op pain management  Laterality: Left  Prep: chloraprep       Needles:  Injection technique: Single-shot  Needle Type: Echogenic Stimulator Needle     Needle Length: 9cm      Additional Needles:   Procedures:,,,, ultrasound used (permanent image in chart),,     Nerve Stimulator or Paresthesia:  Response: 0.52 mA  Additional Responses:   Narrative:  Start time: 11/23/2021 8:38 AM End time: 11/23/2021 8:47 AM Injection made incrementally with aspirations every 5 mL.  Performed by: Personally  Anesthesiologist: Myrtie Soman, MD  Additional Notes: Patient tolerated the procedure well without complications

## 2021-11-23 NOTE — Discharge Instructions (Signed)
Ice to the shoulder constantly.  Keep the incision covered and clean and dry for one week, then ok to get it wet in the shower. ° °Do exercise as instructed several times per day. ° °DO NOT reach behind your back or push up out of a chair with the operative arm. ° °Use a sling while you are up and around for comfort, may remove while seated.  Keep pillow propped behind the operative elbow. ° °Follow up with Dr Kanesha Cadle in two weeks in the office, call [phone] for appt °

## 2021-11-23 NOTE — Interval H&P Note (Signed)
History and Physical Interval Note:  11/23/2021 9:21 AM  Andrew Hardy.  has presented today for surgery, with the diagnosis of left shoulder osteoarthritis.  The various methods of treatment have been discussed with the patient and family. After consideration of risks, benefits and other options for treatment, the patient has consented to  Procedure(s): REVERSE SHOULDER ARTHROPLASTY (Left) as a surgical intervention.  The patient's history has been reviewed, patient examined, no change in status, stable for surgery.  I have reviewed the patient's chart and labs.  Questions were answered to the patient's satisfaction.     Augustin Schooling

## 2021-11-23 NOTE — Anesthesia Postprocedure Evaluation (Signed)
Anesthesia Post Note  Patient: Andrew Hardy.  Procedure(s) Performed: REVERSE SHOULDER ARTHROPLASTY (Left: Shoulder)     Patient location during evaluation: PACU Anesthesia Type: General Level of consciousness: awake and alert Pain management: pain level controlled Vital Signs Assessment: post-procedure vital signs reviewed and stable Respiratory status: spontaneous breathing, nonlabored ventilation, respiratory function stable and patient connected to nasal cannula oxygen Cardiovascular status: blood pressure returned to baseline and stable Postop Assessment: no apparent nausea or vomiting Anesthetic complications: no   No notable events documented.  Last Vitals:  Vitals:   11/23/21 1130 11/23/21 1145  BP: 105/73 99/77  Pulse: 81 91  Resp: 15 19  Temp: 36.5 C   SpO2: 97% 95%    Last Pain:  Vitals:   11/23/21 1145  TempSrc:   PainSc: 0-No pain                 Princella Jaskiewicz S

## 2021-11-23 NOTE — Anesthesia Procedure Notes (Signed)
Anesthesia Procedure Image    

## 2021-11-23 NOTE — Brief Op Note (Signed)
11/23/2021  10:54 AM  PATIENT:  Andrew Hardy.  81 y.o. male  PRE-OPERATIVE DIAGNOSIS:  left shoulder osteoarthritis, end stage   POST-OPERATIVE DIAGNOSIS:  left shoulder osteoarthritis, end stage  PROCEDURE:  Procedure(s): REVERSE SHOULDER ARTHROPLASTY (Left) DePuy Delta Xtend with NO subscap repair  SURGEON:  Surgeon(s) and Role:    [name], MD - Primary  PHYSICIAN ASSISTANT:   ASSISTANTS: [place] Bruns, PA-C   ANESTHESIA:   regional and general  EBL:  100 mL   BLOOD ADMINISTERED:none  DRAINS: none   LOCAL MEDICATIONS USED:  MARCAINE     SPECIMEN:  No Specimen  DISPOSITION OF SPECIMEN:  N/A  COUNTS:  YES  TOURNIQUET:  * No tourniquets in log *  DICTATION: .Other Dictation: Dictation Number [id number]  PLAN OF CARE: Admit for overnight observation  PATIENT DISPOSITION:  PACU - hemodynamically stable.   Delay start of Pharmacological VTE agent (>24hrs) due to surgical blood loss or risk of bleeding: not applicable

## 2021-11-23 NOTE — Op Note (Signed)
NAME: Andrew Hardy RECORD NO: [id number] ACCOUNT NO: [id number] DATE OF BIRTH: October 05, 1940 FACILITY: [facility] LOCATION: [facility] PHYSICIAN: Doran Heater. 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  PROCEDURE PERFORMED:  Left reverse shoulder replacement using DePuy Delta Xtend prosthesis with no subscap repair.  ATTENDING SURGEON:  Doran Heater. Veverly Fells, MD  ASSISTANT:  Darol Destine, MD, PA-C, who was scrubbed during the entire procedure, and necessary for satisfactory completion of surgery.  ANESTHESIA:  General anesthesia was used plus interscalene block.  ESTIMATED BLOOD LOSS:  150 mL  FLUID REPLACEMENT:  1500 mL crystalloid.  INSTRUMENT COUNTS:  Correct.  COMPLICATIONS:  There were no complications.  ANTIBIOTICS:  Perioperative antibiotics were given.  INDICATIONS:  The patient is a 81 year old male with worsening left shoulder pain and dysfunction secondary to end-stage arthritis, bone-on-bone with rotator cuff insufficiency.  The patient has failed an extended period of conservative management and  presents for operative treatment to relieve pain and restore function.  Informed consent obtained.  DESCRIPTION OF PROCEDURE:  After an adequate level of anesthesia was achieved, the patient was positioned in the modified beach chair position.  Left shoulder correctly identified and sterilely prepped and draped in the usual manner.  Timeout called,  verifying correct patient, correct site.  We entered the patient's shoulder using a standard deltopectoral approach, starting at the coracoid process extending down to the anterior humerus using a #10 blade scalpel.  Dissection down through the  subcutaneous tissues with Bovie.  We found the cephalic vein and took that laterally  with the deltoid.  Pectoralis was taken medially.  Conjoined tendon retracted medially.  Deep retractors placed.  We tenodesed the biceps in situ with 0 Vicryl  figure-of-eight suture.  I believe the biceps when we got into the joint was basically already tenodesed but we had done that initially, we then went ahead and released the subscapularis remnant off the lesser tuberosity and tagged with #2 FiberWire to  protect the axillary nerve.  We released the inferior capsule extending the shoulder and delivering the humeral head out of the wound.  The rotator cuff was completely gone.  The teres minor was still intact.  We did go ahead and entered the proximal  humerus, which was devoid of cartilage with a 6 mm reamer, reamed up to a size 12.  We then placed our 12 mm T-handle guide and resected the head at 20 degrees of retroversion using the oscillating saw.  We then used a rongeur to remove excess medial  osteophytes.  Next, we subluxed the humerus posteriorly, gaining good exposure of the glenoid face.  We removed the glenoid capsule and labrum carefully protecting the axillary nerve.  We then placed our retractors around the glenoid, removed the  remaining cartilage with a Cobb elevator and then found our center point for a guide pin.  We placed that bicortical.  Next, we went ahead and reamed for the metaglene baseplate.  We next did our peripheral hand reaming and then drilled our central peg  hole.  We impacted the metaglene into position, which was HA coated press fit.  We then placed a 42 screw inferiorly, a 36 screw at the base of coracoid and 18 nonlocked screw posteriorly, we locked the superior and inferior screws.  Excellent screw  purchase and baseplate security.  We  then selected a real 42 standard glenosphere and attached that to the baseplate using the screwdriver.  Once we had that attached I did a finger sweep to make sure we had no soft tissue caught up in that bearing.   Next, we went to  the humeral side, we reamed for the 2 left metaphysis and then impacted the trial 12 stem, 2 left set on the 0 setting and placed in 20 degrees of retroversion.  Once we had that trial stem in place, we placed the 42+3 poly trial on the  tray and reduced the shoulder.  We had a really nice stability and excellent range of motion, no impingement and appropriate tension on the conjoined tendon.  No gapping with inferior pole.  We removed the trial components, irrigated thoroughly.  We then  used available bone graft from the humeral head with impaction grafting technique and implanted the HA coated press-fit stem size 12 and a 2 left set on the 0 setting and impacted in 20 degrees of retroversion.  We had a really nice stable stem selected  the real 42+3 poly impacted on the humeral tray and then reduced the shoulder.  We were pleased again with our balance and soft tissue stability.  We then went ahead and irrigated again.  We resected the subscap remnant and then repaired deltopectoral  interval with 0 Vicryl suture followed by 2-0 Vicryl for subcutaneous closure and 4-0 Monocryl for skin.  Steri-Strips applied followed by sterile dressing.  The patient tolerated surgery well.     [email or web address] D: 11/23/2021 10:59:52 am T: 11/23/2021 11:26:00 am  JOB: [id number]/ [id number]

## 2021-11-23 NOTE — Transfer of Care (Signed)
Immediate Anesthesia Transfer of Care Note  Patient: Andrew Hardy.  Procedure(s) Performed: REVERSE SHOULDER ARTHROPLASTY (Left: Shoulder)  Patient Location: PACU  Anesthesia Type:GA combined with regional for post-op pain  Level of Consciousness: awake, drowsy and patient cooperative  Airway & Oxygen Therapy: Patient Spontanous Breathing and Patient connected to face mask oxygen  Post-op Assessment: Report given to RN and Post -op Vital signs reviewed and stable  Post vital signs: Reviewed and stable  Last Vitals:  Vitals Value Taken Time  BP 130/95 11/23/21 1107  Temp    Pulse 85 11/23/21 1107  Resp 18 11/23/21 1107  SpO2 98 % 11/23/21 1107  Vitals shown include unvalidated device data.  Last Pain:  Vitals:   11/23/21 0844  TempSrc:   PainSc: 0-No pain         Complications: No notable events documented.

## 2021-11-23 NOTE — Anesthesia Procedure Notes (Signed)
Procedure Name: Intubation Date/Time: 11/23/2021 9:25 AM  Performed by: Raenette Rover, CRNAPre-anesthesia Checklist: Patient identified, Emergency Drugs available, Suction available and Patient being monitored Patient Re-evaluated:Patient Re-evaluated prior to induction Oxygen Delivery Method: Circle system utilized Preoxygenation: Pre-oxygenation with 100% oxygen Induction Type: IV induction Ventilation: Mask ventilation without difficulty and Oral airway inserted - appropriate to patient size Laryngoscope Size: Glidescope and 4 Grade View: Grade I Tube type: Oral Tube size: 7.5 mm Number of attempts: 1 Airway Equipment and Method: Stylet, Oral airway and Video-laryngoscopy Placement Confirmation: ETT inserted through vocal cords under direct vision, positive ETCO2 and breath sounds checked- equal and bilateral Secured at: 23 cm Tube secured with: Tape Dental Injury: Teeth and Oropharynx as per pre-operative assessment

## 2021-11-24 DIAGNOSIS — M19012 Primary osteoarthritis, left shoulder: Secondary | ICD-10-CM | POA: Diagnosis not present

## 2021-11-24 NOTE — Progress Notes (Signed)
Subjective: Patient without complaints/ Nerve block still providing pain relief  Objective: Vital signs in last 24 hours: Temp:  [97.7 F (36.5 C)-98.8 F (37.1 C)] 98.6 F (37 C) (08/05 0534) Pulse Rate:  [73-105] 87 (08/05 0534) Resp:  [13-20] 19 (08/05 0534) BP: (99-130)/(65-95) 119/77 (08/05 0534) SpO2:  [95 %-100 %] 100 % (08/05 0534) Weight:  [93.9 kg] 93.9 kg (08/04 1401)  Intake/Output from previous day: 08/04 0701 - 08/05 0700 In: 3830 [P.O.:1080; I.V.:2450; IV Piggyback:300] Out: 2280 [Urine:2180; Blood:100] Intake/Output this shift: No intake/output data recorded.  No results for input(s): "HGB" in the last 72 hours. No results for input(s): "WBC", "RBC", "HCT", "PLT" in the last 72 hours. No results for input(s): "NA", "K", "CL", "CO2", "BUN", "CREATININE", "GLUCOSE", "CALCIUM" in the last 72 hours. No results for input(s): "LABPT", "INR" in the last 72 hours.  No cellulitis present Dressing C/D/I Moves fingers and wrist well   Assessment/Plan: S/p left RSA- Doing well. No complaints. Should be able to go home today    Gaynelle Arabian 11/24/2021, 7:38 AM

## 2021-11-24 NOTE — Plan of Care (Signed)
  Problem: Education: Goal: Knowledge of General Education information will improve Description: Including pain rating scale, medication(s)/side effects and non-pharmacologic comfort measures Outcome: Progressing   Problem: Activity: Goal: Risk for activity intolerance will decrease Outcome: Progressing   Problem: Pain Managment: Goal: General experience of comfort will improve Outcome: Progressing   

## 2021-11-24 NOTE — Discharge Summary (Signed)
In most cases prophylactic antibiotics for Dental procdeures after total joint surgery are not necessary.  Exceptions are as follows:  1. History of prior total joint infection  2. Severely immunocompromised (Organ Transplant, cancer chemotherapy, Rheumatoid biologic meds such as [place])  3. Poorly controlled diabetes (A1C &gt; 8.0, blood glucose over 200)  If you have one of these conditions, contact your surgeon for an antibiotic prescription, prior to your dental procedure. Orthopedic Discharge Summary        Physician Discharge Summary  Patient ID: Andrew Hardy. MRN: [id number] DOB/AGE: Nov 14, 1940 81 y.o.  Admit date: 11/23/2021 Discharge date: 11/24/2021   Procedures:  Procedure(s) (LRB): REVERSE SHOULDER ARTHROPLASTY (Left)  Attending Physician:  Dr. Esmond Plants  Admission Diagnoses:   Left shoulder end stage OA  Discharge Diagnoses:  same3   Past Medical History:  Diagnosis Date   Anxiety    Arthritis    Atypical atrial flutter ([place]) 12/14/2014   Bradycardia, sinus 10/15/2014   Burning sensation of feet 01/17/2016   CAD, multiple vessel 10/18/2014   Overview:  DES to [facility] for unstable angina 10/17/14 Diagnostic Summary Severe stenosis of mid Cx likely cause of recent accelerating angina Diffuse severe stenosis of small caliber LAD unchanged from 2012 Normal LV function Paroxysmal SVT noted during procedure 04/23/16:  PCI / Resolute Drug Eluting Stent of the proximal Left Anterior Descending Coronary Artery. 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   IHD (ischemic heart disease) 10/15/2014   Lower limb pain, inferior, right 12/17/2017   Lumbar radiculopathy 02/21/2016   Myocardial infarction [facility]) 2003   NSTEMI (non-ST elevated myocardial infarction) ([place]) 04/24/2016   Overview:  LHC 04/24/15: Severe stenosis of the LAD & Diagonal bifurcation  LV ejection fraction is 55-60 %  Interventional Summary Successful Angiosculpt PCI / Resolute Drug Eluting Stent of the proximal LAD Successful PTCA of the ostial 1st Diagonal Coronary Artery   Paroxysmal SVT (supraventricular tachycardia) ([place]) 11/16/2014   Precordial pain 10/15/2014   PVC's (premature ventricular contractions) 04/22/2016   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 Patient tolerated the procedure well and brought to the recovery room in good condition and subsequently to the floor. Patient had an uncomplicated hospital course and was stable for discharge.   Disposition: Discharge disposition: 01-Home or Self Care      with follow up in 2 weeks    Follow-up Information     [name], MD. Call in 2 week(s).   Specialty: Orthopedic Surgery Why: call [phone] for appt. in two weeks Contact information: [address] [place] [address] [phone]                 Dental Antibiotics:  In most cases prophylactic antibiotics for Dental procdeures after total joint surgery are not necessary.  Exceptions are as follows:  1. History of prior total joint infection  2. Severely immunocompromised (Organ Transplant, cancer chemotherapy, Rheumatoid biologic meds such as [place])  3. Poorly controlled diabetes (A1C &gt; 8.0, blood glucose over 200)  If you have one of these conditions, contact your surgeon for an antibiotic prescription, prior to your dental procedure.    Allergies as of 11/24/2021       Reactions   Amiodarone    Eye toxicity   Sotalol Hcl    Feet burning   Buprenorphine Hcl Itching   Codeine Itching   Hydrocodone Itching   Percocet [oxycodone-acetaminophen] Itching   Pt.states he can take but must take with benadryl        Medication List     TAKE these medications    acetaminophen 325 MG tablet Commonly known as: TYLENOL Take 2 tablets (650 mg total) by mouth every 6 (six) hours as needed (pain).   B-12 PO Take 1 tablet by mouth daily.   clopidogrel 75 MG tablet Commonly known as: PLAVIX Take 1 tablet (75 mg total) by mouth daily with breakfast.   CO-ENZYME Q-10 PO Take 50 mg by mouth daily.   digoxin 0.25 MG tablet Commonly known as: LANOXIN Take 1 tablet (250 mcg total) by mouth daily. What changed: when to take this   DULoxetine 30 MG  capsule Commonly known as: CYMBALTA Take 30 mg by mouth 2 (two) times daily.   ezetimibe 10 MG tablet Commonly known as: ZETIA TAKE 1 TABLET EVERY EVENING   finasteride 5 MG tablet Commonly known as: PROSCAR Take 5 mg by mouth daily.   FISH OIL PO Take 1 tablet by mouth daily.   Flaxseed Oil Max Str 1300 MG Caps Take 1,300 mg by mouth daily.   furosemide 80 MG tablet Commonly known as: LASIX Take 1 tablet (80 mg total) by mouth every morning AND 0.5 tablets (40 mg total) every evening. What changed: See the new instructions.   gemfibrozil 600 MG tablet Commonly known as: LOPID Take 0.5 tablets (300 mg total) by mouth 2 (two) times daily before a meal.   GLUCOSAMINE PO Take 1 tablet by mouth 2 (two) times daily.   isosorbide mononitrate 60 MG 24 hr tablet Commonly known as: IMDUR Take 1 tablet (60 mg total) by mouth daily. TAKE 1 TABLET(60 MG) BY MOUTH DAILY   methocarbamol 500 MG tablet Commonly known as: ROBAXIN Take 1 tablet (500 mg total) by mouth every 6 (six) hours as needed for muscle spasms.   metoprolol succinate 25 MG 24 hr tablet Commonly  known as: TOPROL-XL Take 1 tablet (25 mg total) by mouth 2 (two) times daily. What changed: when to take this   minocycline 50 MG capsule Commonly known as: MINOCIN Take 50 mg by mouth daily.   multivitamins ther. w/minerals Tabs tablet Take 1 tablet by mouth daily.   nitroGLYCERIN 0.4 MG SL tablet Commonly known as: Nitrostat Place 1 tablet (0.4 mg total) under the tongue every 5 (five) minutes as needed for chest pain.   pantoprazole 40 MG tablet Commonly known as: PROTONIX Take 1 tablet (40 mg total) by mouth daily.   potassium chloride SA 20 MEQ tablet Commonly known as: K[place]-Con M20 Take 2 tablets (40 mEq total) by mouth every morning AND 1 tablet (20 mEq total) every evening.   PUMPKIN SEED PO Take 1 tablet by mouth daily.   rivaroxaban 20 MG Tabs tablet Commonly known as: Xarelto Take 1 tablet (20  mg total) by mouth daily with supper. What changed: when to take this   simvastatin 40 MG tablet Commonly known as: ZOCOR TAKE 1 TABLET(40 MG) BY MOUTH AT BEDTIME   tamsulosin 0.4 MG Caps capsule Commonly known as: FLOMAX Take 0.4 mg by mouth daily.   traMADol 50 MG tablet Commonly known as: Ultram Take 2 tablets (100 mg total) by mouth every 6 (six) hours as needed for severe pain or moderate pain.   traZODone 50 MG tablet Commonly known as: DESYREL Take 50 mg by mouth at bedtime.   TURMERIC CURCUMIN PO Take 1 capsule by mouth daily.   vitamin C 1000 MG tablet Take 1,000 mg by mouth daily.          Signed: Augustin Schooling 11/24/2021, 8:16 AM  [facility] is now [employer] [address], [place], [place], [place] [address] Phone: [place]

## 2021-11-24 NOTE — Evaluation (Signed)
Occupational Therapy Evaluation Patient Details Name: Andrew Hardy. MRN: [id number] DOB: [birth year] Today's Date: 11/24/2021   History of Present Illness Patient is a 81 year old male who presented to the hosptial for a left total shoulder replacement. 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 Therapist provided education and instruction to patient in regards to exercises, precautions, positioning, donning upper extremity clothing and bathing while maintaining shoulder precautions, ice and edema management and donning/doffing sling. Patient verbalized understanding and demonstrated as needed. Patient needed assistance to donn shirt, underwear, pants, socks and shoes and provided with instruction on compensatory strategies to perform ADLs. Patient to follow up with MD for further therapy needs.        Recommendations for follow up therapy are one component of a multi-disciplinary discharge planning process, led by the attending physician.  Recommendations may be updated based on patient status, additional functional criteria and insurance authorization.   Follow Up Recommendations  Follow physician's recommendations for discharge plan and follow up therapies    Assistance Recommended at Discharge Frequent or constant Supervision/Assistance  Patient can return home with the following A little help with walking and/or transfers;A little help with bathing/dressing/bathroom;Assistance with cooking/housework;Assist for transportation;Help with stairs or ramp for entrance    Functional Status Assessment  Patient has had a recent decline in their functional status and demonstrates the ability to make significant improvements in function in a reasonable and predictable amount of time.  Equipment Recommendations  None recommended by OT     Recommendations for Other Services       Precautions / Restrictions Precautions Precautions: Shoulder Type of Shoulder Precautions: no ROM shoulder, OK for hand wrist and elbow Shoulder Interventions: Shoulder sling/immobilizer;At all times;Off for dressing/bathing/exercises Precaution Booklet Issued: Yes (comment) (handouts) Restrictions Weight Bearing Restrictions: Yes LUE Weight Bearing: Non weight bearing      Mobility Bed Mobility               General bed mobility comments: patient was able to complete with HOB raised and bed rail use with increased time. patient reported he would sleep in recliner at home    Transfers Overall transfer level: Modified independent                        Balance Overall balance assessment: Mild deficits observed, not formally tested                                         ADL either performed or assessed with clinical judgement   ADL Overall ADL's : Needs assistance/impaired Eating/Feeding: Set up;Sitting Eating/Feeding Details (indicate cue type and reason): in recliner Grooming: Set up;Sitting   Upper Body Bathing: Moderate assistance;Sitting   Lower Body Bathing: Moderate assistance;Sit to/from stand   Upper Body Dressing : Moderate assistance;Sitting Upper Body Dressing Details (indicate cue type and reason): with education not to picku pshoulder to move to "assist" with tasks. patient reported he just wanted to be helpful. patient was cued multiple times during session to keep elbow at side. patient reported he has had surgeries on shoulders prior level over 7 between two shoulders. Lower Body Dressing: Minimal assistance;Sit to/from stand Lower Body Dressing Details (indicate cue type and reason): with buttons  on pants Toilet Transfer: Copy Details (indicate cue type and reason): with no AD from one side of bed to other to get to recliner for  breakfast Toileting- Clothing Manipulation and Hygiene: Minimal assistance;Sit to/from stand       Functional mobility during ADLs: Supervision/safety       Vision Patient Visual Report: No change from baseline       Perception     Praxis      Pertinent Vitals/Pain Pain Assessment Pain Assessment: 0-10 Pain Score: 7  Pain Location: L shoulder Pain Descriptors / Indicators: Discomfort, Grimacing, Operative site guarding, Sharp, Guarding Pain Intervention(s): Limited activity within patient's tolerance, Monitored during session, Premedicated before session, Repositioned, Ice applied     Hand Dominance Right   Extremity/Trunk Assessment Upper Extremity Assessment Upper Extremity Assessment: LUE deficits/detail LUE Deficits / Details: able to move hand and wrist. not elbow. no ROM restrictions per MD orders after surgery at this time   Lower Extremity Assessment Lower Extremity Assessment: Overall WFL for tasks assessed   Cervical / Trunk Assessment Cervical / Trunk Assessment: Normal   Communication Communication Communication: No difficulties   Cognition Arousal/Alertness: Awake/alert Behavior During Therapy: WFL for tasks assessed/performed Overall Cognitive Status: Within Functional Limits for tasks assessed                                       General Comments       Exercises     Shoulder Instructions Shoulder Instructions Donning/doffing shirt without moving shoulder: Minimal assistance Method for sponge bathing under operated UE: Minimal assistance Donning/doffing sling/immobilizer: Supervision/safety Correct positioning of sling/immobilizer: Supervision/safety ROM for elbow, wrist and digits of operated UE: Supervision/safety Sling wearing schedule (on at all times/off for ADL's): Supervision/safety Proper positioning of operated UE when showering: Supervision/safety Positioning of UE while sleeping: [place] expects to be discharged to:: Private residence Living Arrangements: Spouse/significant other Available Help at Discharge: Family;Available 24 hours/day Type of Home: House                                  Prior Functioning/Environment Prior Level of Function : Independent/Modified Independent               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        OT Problem List: Decreased activity tolerance;Impaired balance (sitting and/or standing);Impaired UE functional use;Decreased knowledge of precautions;Decreased knowledge of use of DME or AE;Cardiopulmonary status limiting activity      OT Treatment/Interventions: Self-care/ADL training;Therapeutic exercise;Energy conservation;DME and/or AE instruction;Therapeutic activities;Balance training;Patient/family education    OT Goals(Current goals can be found in the care plan section) Acute Rehab OT Goals OT Goal Formulation: All assessment and education complete, DC therapy  OT Frequency: Min 2X/week    Co-evaluation              AM-PAC OT "6 Clicks" Daily Activity     Outcome Measure Help from another person eating meals?: A Little Help from another person taking care of personal grooming?: A Little Help from another person toileting, which includes using toliet, bedpan, or urinal?: A Little Help from another person bathing (including washing, rinsing, drying)?: A Lot Help from another person to put on and taking off regular upper body clothing?: A Lot Help from  another person to put on and taking off regular lower body clothing?: A Little 6 Click Score: 16   End of Session Nurse Communication: Mobility status  Activity Tolerance: Patient tolerated treatment well Patient left: in chair;with call bell/phone within reach  OT Visit Diagnosis: Other abnormalities of gait and mobility (R26.89);Unsteadiness on feet (R26.81);Pain                Time: 1712-7871 OT Time  Calculation (min): 27 min Charges:  OT General Charges $OT Visit: 1 Visit OT Evaluation $OT Eval Low Complexity: 1 Low OT Treatments $Self Care/Home Management : 8-22 mins  Jackelyn Poling OTR/L, MS Acute Rehabilitation Department Office# [phone] Pager# [phone]   Marcellina Millin 11/24/2021, 9:05 AM

## 2021-11-24 NOTE — Plan of Care (Signed)
  Problem: Coping: Goal: Level of anxiety will decrease Outcome: Progressing   Problem: Elimination: Goal: Will not experience complications related to bowel motility Outcome: Progressing Goal: Will not experience complications related to urinary retention Outcome: Progressing   Problem: Elimination: Goal: Will not experience complications related to bowel motility Outcome: Progressing Goal: Will not experience complications related to urinary retention Outcome: Progressing   Problem: Safety: Goal: Ability to remain free from injury will improve Outcome: Progressing   Problem: Pain Managment: Goal: General experience of comfort will improve Outcome: Progressing

## 2021-11-26 ENCOUNTER — Encounter (HOSPITAL_COMMUNITY): Payer: Self-pay | Admitting: Orthopedic Surgery

## 2021-12-06 DIAGNOSIS — Z4789 Encounter for other orthopedic aftercare: Secondary | ICD-10-CM | POA: Diagnosis not present

## 2021-12-14 DIAGNOSIS — M25512 Pain in left shoulder: Secondary | ICD-10-CM | POA: Diagnosis not present

## 2021-12-14 DIAGNOSIS — M6281 Muscle weakness (generalized): Secondary | ICD-10-CM | POA: Diagnosis not present

## 2021-12-18 DIAGNOSIS — M6281 Muscle weakness (generalized): Secondary | ICD-10-CM | POA: Diagnosis not present

## 2021-12-18 DIAGNOSIS — M25512 Pain in left shoulder: Secondary | ICD-10-CM | POA: Diagnosis not present

## 2021-12-21 DIAGNOSIS — M25512 Pain in left shoulder: Secondary | ICD-10-CM | POA: Diagnosis not present

## 2021-12-21 DIAGNOSIS — M6281 Muscle weakness (generalized): Secondary | ICD-10-CM | POA: Diagnosis not present

## 2021-12-25 DIAGNOSIS — M6281 Muscle weakness (generalized): Secondary | ICD-10-CM | POA: Diagnosis not present

## 2021-12-25 DIAGNOSIS — M25512 Pain in left shoulder: Secondary | ICD-10-CM | POA: Diagnosis not present

## 2021-12-26 ENCOUNTER — Telehealth: Payer: Self-pay | Admitting: Cardiology

## 2021-12-26 DIAGNOSIS — E1169 Type 2 diabetes mellitus with other specified complication: Secondary | ICD-10-CM | POA: Diagnosis not present

## 2021-12-26 DIAGNOSIS — E1142 Type 2 diabetes mellitus with diabetic polyneuropathy: Secondary | ICD-10-CM | POA: Diagnosis not present

## 2021-12-26 DIAGNOSIS — E669 Obesity, unspecified: Secondary | ICD-10-CM | POA: Diagnosis not present

## 2021-12-26 DIAGNOSIS — I25119 Atherosclerotic heart disease of native coronary artery with unspecified angina pectoris: Secondary | ICD-10-CM | POA: Diagnosis not present

## 2021-12-26 DIAGNOSIS — D6869 Other thrombophilia: Secondary | ICD-10-CM | POA: Diagnosis not present

## 2021-12-26 DIAGNOSIS — I25118 Atherosclerotic heart disease of native coronary artery with other forms of angina pectoris: Secondary | ICD-10-CM | POA: Diagnosis not present

## 2021-12-26 DIAGNOSIS — I509 Heart failure, unspecified: Secondary | ICD-10-CM | POA: Diagnosis not present

## 2021-12-26 DIAGNOSIS — I110 Hypertensive heart disease with heart failure: Secondary | ICD-10-CM | POA: Diagnosis not present

## 2021-12-26 DIAGNOSIS — F330 Major depressive disorder, recurrent, mild: Secondary | ICD-10-CM | POA: Diagnosis not present

## 2021-12-26 DIAGNOSIS — I4891 Unspecified atrial fibrillation: Secondary | ICD-10-CM | POA: Diagnosis not present

## 2021-12-26 DIAGNOSIS — E261 Secondary hyperaldosteronism: Secondary | ICD-10-CM | POA: Diagnosis not present

## 2021-12-26 DIAGNOSIS — E785 Hyperlipidemia, unspecified: Secondary | ICD-10-CM | POA: Diagnosis not present

## 2021-12-26 NOTE — Telephone Encounter (Signed)
Attempted to call the patient. The patient did not answer and the voice mailbox was not set-up to leave a message.

## 2021-12-26 NOTE — Telephone Encounter (Signed)
Pt c/o Shortness Of Breath: STAT if SOB developed within the last 24 hours or pt is noticeably SOB on the phone  1. Are you currently SOB (can you hear that pt is SOB on the phone)? No  2. How long have you been experiencing SOB? 4-6 months  3. Are you SOB when sitting or when up moving around? Up moving around  4. Are you currently experiencing any other symptoms? No  Pt c/o medication issue:  1. Name of Medication: rivaroxaban (XARELTO) 20 MG TABS tablet clopidogrel (PLAVIX) 75 MG tablet  2. How are you currently taking this medication (dosage and times per day)? As prescribed   3. Are you having a reaction (difficulty breathing--STAT)?   4. What is your medication issue? Pt home health nurse is calling stating that she is confused as to why this patient is on 2 different blood thinners and would like to discuss only doing on or the other due to bruising.  She is also concerned about patient SOB.

## 2021-12-27 NOTE — Telephone Encounter (Signed)
Called patient and he reported having nose bleeds and lots of bruising and stated that he was on 2 blood thinners: Xarelto and Plavix. He also reported being short of breath with any exertion. Patient states that the shortness of breath has been occurring for around 4 - 6 months.

## 2021-12-28 ENCOUNTER — Other Ambulatory Visit: Payer: Self-pay | Admitting: Cardiology

## 2021-12-28 ENCOUNTER — Other Ambulatory Visit: Payer: Self-pay

## 2021-12-28 DIAGNOSIS — Z79899 Other long term (current) drug therapy: Secondary | ICD-10-CM

## 2021-12-28 DIAGNOSIS — M25512 Pain in left shoulder: Secondary | ICD-10-CM | POA: Diagnosis not present

## 2021-12-28 DIAGNOSIS — M6281 Muscle weakness (generalized): Secondary | ICD-10-CM | POA: Diagnosis not present

## 2021-12-28 NOTE — Telephone Encounter (Signed)
Called patient and informed him of Dr. Joya Gaskins recommendation below:  "He needs to take both with atrial fibrillation and stent  What he needs to do is to go see ENT  If he is having nosebleeds presently I would stop his anticoagulant for 2 to 3 days"   Patient was agreeable with this plan and had no further questions

## 2022-01-01 DIAGNOSIS — M25512 Pain in left shoulder: Secondary | ICD-10-CM | POA: Diagnosis not present

## 2022-01-01 DIAGNOSIS — M6281 Muscle weakness (generalized): Secondary | ICD-10-CM | POA: Diagnosis not present

## 2022-01-03 DIAGNOSIS — Z4789 Encounter for other orthopedic aftercare: Secondary | ICD-10-CM | POA: Diagnosis not present

## 2022-01-04 DIAGNOSIS — I509 Heart failure, unspecified: Secondary | ICD-10-CM | POA: Diagnosis not present

## 2022-01-04 DIAGNOSIS — Z23 Encounter for immunization: Secondary | ICD-10-CM | POA: Diagnosis not present

## 2022-01-04 DIAGNOSIS — M6281 Muscle weakness (generalized): Secondary | ICD-10-CM | POA: Diagnosis not present

## 2022-01-04 DIAGNOSIS — Z6832 Body mass index (BMI) 32.0-32.9, adult: Secondary | ICD-10-CM | POA: Diagnosis not present

## 2022-01-04 DIAGNOSIS — I4891 Unspecified atrial fibrillation: Secondary | ICD-10-CM | POA: Diagnosis not present

## 2022-01-04 DIAGNOSIS — I25118 Atherosclerotic heart disease of native coronary artery with other forms of angina pectoris: Secondary | ICD-10-CM | POA: Diagnosis not present

## 2022-01-04 DIAGNOSIS — R0602 Shortness of breath: Secondary | ICD-10-CM | POA: Diagnosis not present

## 2022-01-04 DIAGNOSIS — M25512 Pain in left shoulder: Secondary | ICD-10-CM | POA: Diagnosis not present

## 2022-01-07 DIAGNOSIS — M25512 Pain in left shoulder: Secondary | ICD-10-CM | POA: Diagnosis not present

## 2022-01-07 DIAGNOSIS — M6281 Muscle weakness (generalized): Secondary | ICD-10-CM | POA: Diagnosis not present

## 2022-01-09 DIAGNOSIS — M25512 Pain in left shoulder: Secondary | ICD-10-CM | POA: Diagnosis not present

## 2022-01-09 DIAGNOSIS — M6281 Muscle weakness (generalized): Secondary | ICD-10-CM | POA: Diagnosis not present

## 2022-01-14 ENCOUNTER — Ambulatory Visit: Payer: HMO | Attending: Cardiology | Admitting: Cardiology

## 2022-01-14 ENCOUNTER — Ambulatory Visit: Payer: HMO | Attending: Cardiology

## 2022-01-14 ENCOUNTER — Encounter: Payer: Self-pay | Admitting: Cardiology

## 2022-01-14 VITALS — BP 94/60 | HR 89 | Ht 66.0 in | Wt 204.8 lb

## 2022-01-14 DIAGNOSIS — Z79899 Other long term (current) drug therapy: Secondary | ICD-10-CM | POA: Diagnosis not present

## 2022-01-14 DIAGNOSIS — E782 Mixed hyperlipidemia: Secondary | ICD-10-CM

## 2022-01-14 DIAGNOSIS — I4819 Other persistent atrial fibrillation: Secondary | ICD-10-CM | POA: Diagnosis not present

## 2022-01-14 DIAGNOSIS — Z7901 Long term (current) use of anticoagulants: Secondary | ICD-10-CM | POA: Diagnosis not present

## 2022-01-14 DIAGNOSIS — R0602 Shortness of breath: Secondary | ICD-10-CM | POA: Diagnosis not present

## 2022-01-14 DIAGNOSIS — I25119 Atherosclerotic heart disease of native coronary artery with unspecified angina pectoris: Secondary | ICD-10-CM

## 2022-01-14 DIAGNOSIS — I110 Hypertensive heart disease with heart failure: Secondary | ICD-10-CM

## 2022-01-14 MED ORDER — FUROSEMIDE 80 MG PO TABS: 80.0000 mg | ORAL_TABLET | Freq: Every morning | ORAL | 3 refills | Status: DC

## 2022-01-14 NOTE — Patient Instructions (Addendum)
Medication Instructions:  Your physician has recommended you make the following change in your medication:  START: Lasix 80 mg daily in the morning  *If you need a refill on your cardiac medications before your next appointment, please call your pharmacy*   Lab Work: Your physician recommends that you return for lab work in:   Labs today: CMP, CBC, Pro BNP, Digoxin level  If you have labs (blood work) drawn today and your tests are completely normal, you will receive your results only by: MyChart Message (if you have MyChart) OR A paper copy in the mail If you have any lab test that is abnormal or we need to change your treatment, we will call you to review the results.   Testing/Procedures: A chest x-ray takes a picture of the organs and structures inside the chest, including the heart, lungs, and blood vessels. This test can show several things, including, whether the heart is enlarges; whether fluid is building up in the lungs; and whether pacemaker / defibrillator leads are still in place.   Your physician has requested that you have an echocardiogram. Echocardiography is a painless test that uses sound waves to create images of your heart. It provides your doctor with information about the size and shape of your heart and how well your heart's chambers and valves are working. This procedure takes approximately one hour. There are no restrictions for this procedure.  A zio monitor was ordered today. It will remain on for 7 days. You will then return monitor and event diary in provided box. It takes 1-2 weeks for report to be downloaded and returned to [place]. We will call you with the results. If monitor falls off or has orange flashing light, please call [employer] for further instructions.     Follow-Up: At [facility], you and your health needs are our priority.  As part of our continuing mission to provide you with exceptional heart care, we have created designated Provider Care  Teams.  These Care Teams include your primary Cardiologist (physician) and Advanced Practice Providers (APPs -  Physician Assistants and Nurse Practitioners) who all work together to provide you with the care you need, when you need it.  We recommend signing up for the patient portal called "MyChart".  Sign up information is provided on this After Visit Summary.  MyChart is used to connect with patients for Virtual Visits (Telemedicine).  Patients are able to view lab/test results, encounter notes, upcoming appointments, etc.  Non-urgent messages can be sent to your provider as well.   To learn more about what you can do with MyChart, go to [email or web address].    Your next appointment:   3 month(s)  The format for your next appointment:   In Person  Provider:   Shirlee More, MD    Other Instructions None  Important Information About Sugar

## 2022-01-14 NOTE — Progress Notes (Signed)
Cardiology Office Note:    Date:  01/14/2022   ID:  Andrew Montane., DOB 09/02/1940, MRN [id number]  PCP:  Cyndi Bender, PA-C  Cardiologist:  Shirlee More, MD    Referring MD: Cyndi Bender, PA-C    ASSESSMENT:    1. SOB (shortness of breath) on exertion   2. Coronary artery disease involving native coronary artery of native heart with angina pectoris (HCC)   3. Persistent atrial fibrillation ([place])   4. Chronic anticoagulation   5. High risk medication use   6. Hypertensive heart disease with heart failure ([place])   7. Mixed hyperlipidemia    PLAN:    In order of problems listed above:  His primary complaint is exertional shortness of breath without any obvious etiology.  Previous BNP levels were normal chest x-ray November 2021 normal an echocardiogram at that time he has mildly reduced 45 to 50% and no valvular abnormality.  I have asked her to repeat a chest x-ray regarding lung disease labs including a CBC with his previous GI bleed and anemia ZIO monitor regarding heart rate response with persistent atrial fibrillation and repeat echocardiogram to evaluate for findings of heart failure worsened cardiomyopathy.  In the interim with his borderline low blood pressure we will decrease his diuretic as he has no edema Stable CAD continue medical treatment including combined clopidogrel and anticoagulant Remains in atrial fibrillation rate is controlled apply a ZIO monitor to assess heart rate response to exercise check digoxin level We will reduce his diuretic dose check BMP Continue his statin last lipid profile at target may   Next appointment: 3 months   Medication Adjustments/Labs and Tests Ordered: Current medicines are reviewed at length with the patient today.  Concerns regarding medicines are outlined above.  No orders of the defined types were placed in this encounter.  No orders of the defined types were placed in this encounter.   Chief Complaint  Patient  presents with   Dizziness   Shortness of Breath   Follow-up   Congestive Heart Failure   Atrial Fibrillation    History of Present Illness:    Andrew Munger. is a 81 y.o. male with a hx of complex heart disease including CAD hypertensive heart disease with heart failure chronic anticoagulation with persistent atrial fibrillation iron deficiency anemia previous GI bleeding and most recently underwent coronary angiography 07/21/2021 patent stent proximal and mid LAD patent stent proximal and distal left circumflex and had PCI and stent distal left circumflex coronary artery. He was last seen 09/05/2021.  He inquires if he has valvular heart disease I went back and looked at his echocardiogram from January 21 at that time he had no aortic or mitral stenosis or regurgitation EF was in the range of 45 to 50%.  When he had his left heart catheterization in April EF was estimated at 60 to 55%  He had left shoulder reverse arthroplasty performed 11/23/2021. Compliance with diet, lifestyle and medications: Yes  He had successful shoulder surgery unfortunately feels no better At times he is lightheaded at home and repeat blood pressure by me in the office is 100/60 He complains of being breathless even inside the home with effort but no chest pain edema orthopnea palpitation or syncope He has had previous GI bleed he has noticed no bleeding Past Medical History:  Diagnosis Date   Anxiety    Arthritis    Atypical atrial flutter ([place]) 12/14/2014   Bradycardia, sinus 10/15/2014   Burning sensation  of feet 01/17/2016   CAD, multiple vessel 10/18/2014   Overview:  DES to [facility] for unstable angina 10/17/14 Diagnostic Summary Severe stenosis of mid Cx likely cause of recent accelerating angina Diffuse severe stenosis of small caliber LAD unchanged from 2012 Normal LV function Paroxysmal SVT noted during procedure 04/23/16:  PCI / Resolute Drug Eluting Stent of the proximal Left Anterior Descending  Coronary Artery. Successful PTCA of the ostial 1st Diagonal Coronary Artery   Chronic left-sided low back pain with sciatica 02/21/2016   Coronary artery disease 2003   s/p MI and numerous stents   Coronary artery disease involving native coronary artery of native heart with angina pectoris ([place]) 10/18/2014   Overview:  DES to [facility] for unstable angina 10/17/14 Diagnostic Summary Severe stenosis of mid Cx likely cause of recent accelerating angina Diffuse severe stenosis of small caliber LAD unchanged from 2012 Normal LV function Paroxysmal SVT noted during procedure 04/23/16:  PCI / Resolute Drug Eluting Stent of the proximal Left Anterior Descending Coronary Artery. 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   Degenerative spondylolisthesis 05/02/2017   Depression    Dyspnea    WITH EXERTION   Enlarged prostate without lower urinary tract symptoms (luts) 2012   frequent urination and nocturia; sees dr [name] Late in [place]   Essential hypertension 10/15/2014   Gastroesophageal reflux disease without esophagitis 04/21/2016   GERD (gastroesophageal reflux disease)    Hyperlipidemia    Hypertension    Hypertensive heart disease with heart failure ([place]) 12/20/2016   IHD (ischemic heart disease) 10/15/2014   Lower limb pain, inferior, right 12/17/2017   Lumbar radiculopathy 02/21/2016   Myocardial infarction [facility]) 2003   NSTEMI (non-ST elevated myocardial infarction) ([place]) 04/24/2016   Overview:  LHC 04/24/15: Severe stenosis of the LAD & Diagonal bifurcation  LV ejection fraction is 55-60 %  Interventional Summary Successful Angiosculpt PCI / Resolute Drug Eluting Stent of the proximal LAD Successful PTCA of the ostial 1st Diagonal Coronary Artery   Paroxysmal SVT (supraventricular tachycardia) ([place]) 11/16/2014   Precordial pain 10/15/2014   PVC's (premature ventricular contractions) 04/22/2016   RBBB 10/15/2014   AFIB   S/P angioplasty with  stent 08/10/21 with DES to mid OM1 and distal LCX-OM2 08/11/2021   S/P lumbar fusion 04/24/2017   Sacroiliac joint pain 09/10/2018   Sleep disturbances 01/17/2016   Snoring 01/17/2016   Somatic dysfunction of left sacroiliac joint 06/19/2018   Subscapularis (muscle) sprain 03/29/2011   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    Past Surgical History:  Procedure Laterality Date   BIOPSY  04/24/2020   Procedure: BIOPSY;  Surgeon: Jerene Bears, MD;  Location: [place];  Service: Gastroenterology;;   CARDIAC CATHETERIZATION  10-2010   ptca/[place] in [place]   [place]  04/23/2016   COLONOSCOPY WITH PROPOFOL N/A 04/24/2020   Procedure: COLONOSCOPY WITH PROPOFOL;  Surgeon: Jerene Bears, MD;  Location: [place];  Service: Gastroenterology;  Laterality: N/A;   CORONARY ANGIOPLASTY     CORONARY STENT INTERVENTION N/A 08/10/2021   Procedure: CORONARY STENT INTERVENTION;  Surgeon: Leonie Man, MD;  Location: [place] CV [facility];  Service: Cardiovascular;  Laterality: N/A;   ESOPHAGOGASTRODUODENOSCOPY (EGD) WITH PROPOFOL N/A 04/24/2020   Procedure: ESOPHAGOGASTRODUODENOSCOPY (EGD) WITH PROPOFOL;  Surgeon: Jerene Bears, MD;  Location: [place];  Service: Gastroenterology;  Laterality: N/A;   EYE SURGERY  05-1010   bil cataract ,iol   FRACTURE SURGERY  HEMOSTASIS CONTROL  04/24/2020   Procedure: HEMOSTASIS CONTROL;  Surgeon: Jerene Bears, MD;  Location: [facility] ENDOSCOPY;  Service: Gastroenterology;;   I & D EXTREMITY Right 02/23/2015   Procedure: RIGHT RING FINGER REVISION AMPUTATION;  Surgeon: Leanora Cover, MD;  Location: [place];  Service: Orthopedics;  Laterality: Right;   KNEE ARTHROSCOPY W/ MENISCAL REPAIR  2000   left knee   LEFT HEART CATH AND CORONARY ANGIOGRAPHY N/A 08/10/2021   Procedure: LEFT HEART CATH AND CORONARY ANGIOGRAPHY;  Surgeon: Leonie Man, MD;  Location: [place] [facility];  Service: Cardiovascular;  Laterality: N/A;   LUMBAR EPIDURAL  INJECTION  2011   low back pain; DDD   POLYPECTOMY  04/24/2020   Procedure: POLYPECTOMY;  Surgeon: Jerene Bears, MD;  Location: [facility] ENDOSCOPY;  Service: Gastroenterology;;   REVERSE SHOULDER ARTHROPLASTY Left 11/23/2021   Procedure: REVERSE SHOULDER ARTHROPLASTY;  Surgeon: [name], MD;  Location: [facility];  Service: Orthopedics;  Laterality: Left;   ring finger tramatic qamputation Right    SHOULDER ARTHROSCOPY  03/29/2011   Procedure: ARTHROSCOPY SHOULDER;  Surgeon: Augustin Schooling;  Location: [place];  Service: Orthopedics;  Laterality: Right;  RIGHT SHOULDER ARTHROSCOPY WITH OPEN SUBSCAPULAR REPAIR   SHOULDER OPEN ROTATOR CUFF REPAIR     3 on rt shoulder; 2 on left   TRANSFORAMINAL LUMBAR INTERBODY FUSION (TLIF) WITH PEDICLE SCREW FIXATION 1 LEVEL N/A 04/24/2017   Procedure: TRANSFORAMINAL LUMBAR INTERBODY FUSION (TLIF) L4-5;  Surgeon: Melina Schools, MD;  Location: [place];  Service: Orthopedics;  Laterality: N/A;  4 hrs    Current Medications: Current Meds  Medication Sig   acetaminophen (TYLENOL) 325 MG tablet Take 2 tablets (650 mg total) by mouth every 6 (six) hours as needed (pain).   Ascorbic Acid (VITAMIN C) 1000 MG tablet Take 1,000 mg by mouth daily.   clopidogrel (PLAVIX) 75 MG tablet Take 1 tablet (75 mg total) by mouth daily with breakfast.   CO-ENZYME Q-10 PO Take 50 mg by mouth daily.   Cyanocobalamin (B-12 PO) Take 1 tablet by mouth daily.   digoxin (LANOXIN) 0.25 MG tablet Take 1 tablet (250 mcg total) by mouth daily. (Patient taking differently: Take 0.25 mg by mouth every other day.)   DULoxetine (CYMBALTA) 30 MG capsule Take 30 mg by mouth 2 (two) times daily.   ezetimibe (ZETIA) 10 MG tablet TAKE 1 TABLET(10 MG) BY MOUTH EVERY EVENING (Patient taking differently: Take 10 mg by mouth daily.)   finasteride (PROSCAR) 5 MG tablet Take 5 mg by mouth daily.   Flaxseed, Linseed, (FLAXSEED OIL MAX STR) 1300 MG CAPS Take 1,300 mg by mouth daily.   furosemide (LASIX) 80 MG tablet  Take 1 tablet (80 mg total) by mouth every morning AND 0.5 tablets (40 mg total) every evening. (Patient taking differently: Take 1 tablet 80 mg daily)   gemfibrozil (LOPID) 600 MG tablet TAKE 1/2 TABLET(300 MG) BY MOUTH TWICE DAILY BEFORE A MEAL (Patient taking differently: Take 600 mg by mouth 2 (two) times daily before a meal.)   Glucosamine HCl (GLUCOSAMINE PO) Take 1 tablet by mouth 2 (two) times daily.   isosorbide mononitrate (IMDUR) 60 MG 24 hr tablet Take 1 tablet (60 mg total) by mouth daily. TAKE 1 TABLET(60 MG) BY MOUTH DAILY   methocarbamol (ROBAXIN) 500 MG tablet Take 1 tablet (500 mg total) by mouth every 6 (six) hours as needed for muscle spasms.   metoprolol succinate (TOPROL-XL) 25 MG 24 hr tablet Take 1 tablet (25  mg total) by mouth 2 (two) times daily. (Patient taking differently: Take 25 mg by mouth daily.)   minocycline (MINOCIN,DYNACIN) 50 MG capsule Take 50 mg by mouth daily.   Multiple Vitamins-Minerals (MULTIVITAMINS THER. W/MINERALS) TABS Take 1 tablet by mouth daily.   nitroGLYCERIN (NITROSTAT) 0.4 MG SL tablet Place 1 tablet (0.4 mg total) under the tongue every 5 (five) minutes as needed for chest pain.   Omega-3 Fatty Acids (FISH OIL PO) Take 1 tablet by mouth daily.   pantoprazole (PROTONIX) 40 MG tablet Take 1 tablet (40 mg total) by mouth daily.   potassium chloride SA (KLOR-CON M20) 20 MEQ tablet Take 2 tablets (40 mEq total) by mouth every morning AND 1 tablet (20 mEq total) every evening.   PUMPKIN SEED PO Take 1 tablet by mouth daily.   rivaroxaban (XARELTO) 20 MG TABS tablet Take 1 tablet (20 mg total) by mouth daily with supper. (Patient taking differently: Take 20 mg by mouth at bedtime.)   simvastatin (ZOCOR) 40 MG tablet TAKE 1 TABLET(40 MG) BY MOUTH AT BEDTIME (Patient taking differently: Take 40 mg by mouth daily at 6 PM. TAKE 1 TABLET(40 MG) BY MOUTH AT BEDTIME)   tamsulosin (FLOMAX) 0.4 MG CAPS capsule Take 0.4 mg by mouth daily.   traMADol (ULTRAM) 50  MG tablet Take 2 tablets (100 mg total) by mouth every 6 (six) hours as needed for severe pain or moderate pain.   traZODone (DESYREL) 50 MG tablet Take 50 mg by mouth at bedtime.   TURMERIC CURCUMIN PO Take 1 capsule by mouth daily.     Allergies:   Amiodarone, Sotalol hcl, Buprenorphine hcl, Codeine, Hydrocodone, and Percocet [oxycodone-acetaminophen]   Social History   Socioeconomic History   Marital status: Married    Spouse name: Not on file   Number of children: Not on file   Years of education: Not on file   Highest education level: Not on file  Occupational History   Not on file  Tobacco Use   Smoking status: Former    Packs/day: 1.00    Years: 20.00    Total pack years: 20.00    Types: Cigarettes    Quit date: 03/20/1994    Years since quitting: 27.8   Smokeless tobacco: Former    Types: Chew    Quit date: 03/20/1996  Vaping Use   Vaping Use: Never used  Substance and Sexual Activity   Alcohol use: Yes    Alcohol/week: 6.0 standard drinks of alcohol    Types: 6 Cans of beer per week    Comment: BEER- DAILY   Drug use: No   Sexual activity: Not on file  Other Topics Concern   Not on file  Social History Narrative   Not on file   Social Determinants of Health   Financial Resource Strain: Not on file  Food Insecurity: Not on file  Transportation Needs: Not on file  Physical Activity: Not on file  Stress: Not on file  Social Connections: Not on file     Family History: The patient's family history includes Cancer in his father and sister; Heart disease in his father and mother. ROS:   Please see the history of present illness.    All other systems reviewed and are negative.  EKGs/Labs/Other Studies Reviewed:    The following studies were reviewed today: Left heart catheterization:  08/10/2021: Conclusion   Prox RCA to Mid RCA lesion is 30% stenosed.   Previously placed Prox LAD to Mid LAD stent (  unknown type) is  widely patent.  Jailed 1st Diag  lesion is 60% stenosed.   Previously placed Prox Cx to Dist Cx stent (unknown type) is  widely patent.   Lesion #1: Dist Cx lesion is 90% stenosed.   A drug-eluting stent was successfully placed using a STENT ONYX FRONTIER 2.25X12.  Deployed at 2.35 mm and postdilated to 2.45 mm in the overlap segment.   Post intervention, there is a 0% residual stenosis.   Lesion #2: 1st Mrg lesion is 80% stenosed with 95% stenosed side branch in Lat 1st Mrg.   Mid LAD lesion is 55% stenosed.   Dist LAD lesion is 50% stenosed.   A drug-eluting stent was successfully placed using a STENT ONYX FRONTIER 2.25X1 -> deployed to 2.45 mm.   Post intervention, there is a 0% residual stenosis.  Post intervention, the side branch was reduced to 100% residual stenosis.  (Patient did have chest pain with stent placement, resolved with sublingual nitroglycerin and fentanyl/Versed.)   Multivessel CAD with 2 culprit lesions: Mid OM1 (85%) at small branch and distal LCx-OM2 (90%) just distal to prior stent.  Mild ISR in RCA stent and ostial diagonal (jailed) 60%.  Tandem 50 to 60% lesions in the mid LAD that are in hinge points.  Not likely flow-limiting. Successful two-vessel PCI of 2 culprit lesions reducing to 0% stenosis each treated with Onyx frontier DES 2.25 mm 12 mm stents dilated to 2.45 mm. Preserved LVEF with low normal roughly 50 to 55%.  Inadequate filling-therefore unable to truly estimate EF and wall motion.   Coronary Diagrams   Diagnostic Dominance: Right Intervention             Recent Labs: 03/12/2021: ALT 16; NT-Pro BNP 511 11/14/2021: BUN 19; Creatinine, Ser 1.14; Hemoglobin 14.6; Platelets 203; Potassium 4.9; Sodium 140  Recent Lipid Panel    Component Value Date/Time   CHOL 184 03/12/2021 0820   TRIG 318 (H) 03/12/2021 0820   HDL 48 03/12/2021 0820   CHOLHDL 3.8 03/12/2021 0820   LDLCALC 84 03/12/2021 0820    Physical Exam:    VS:  BP 94/60 (BP Location: Right Arm, Patient Position:  Sitting)   Pulse 89   Ht '5\' 6"'$  (1.676 m)   Wt 204 lb 12.8 oz (92.9 kg)   SpO2 97%   BMI 33.06 kg/m     Wt Readings from Last 3 Encounters:  01/14/22 204 lb 12.8 oz (92.9 kg)  11/23/21 207 lb (93.9 kg)  11/14/21 207 lb (93.9 kg)  Review peak blood pressure by me sitting right upper extremity normal adult cuff 100/60  GEN: He does not look sick or debilitated no pallor of the skin and membranes well nourished, well developed in no acute distress HEENT: Normal NECK: No JVD; No carotid bruits LYMPHATICS: No lymphadenopathy CARDIAC: Irregular rate and rhythm RRR, no murmurs, rubs, gallops RESPIRATORY:  Clear to auscultation without rales, wheezing or rhonchi  ABDOMEN: Soft, non-tender, non-distended MUSCULOSKELETAL:  No edema; No deformity  SKIN: Warm and dry NEUROLOGIC:  Alert and oriented x 3 PSYCHIATRIC:  Normal affect    Signed, Shirlee More, MD  01/14/2022 8:35 AM    [place]

## 2022-01-15 DIAGNOSIS — M25512 Pain in left shoulder: Secondary | ICD-10-CM | POA: Diagnosis not present

## 2022-01-15 DIAGNOSIS — M6281 Muscle weakness (generalized): Secondary | ICD-10-CM | POA: Diagnosis not present

## 2022-01-18 DIAGNOSIS — M6281 Muscle weakness (generalized): Secondary | ICD-10-CM | POA: Diagnosis not present

## 2022-01-18 DIAGNOSIS — M25512 Pain in left shoulder: Secondary | ICD-10-CM | POA: Diagnosis not present

## 2022-01-22 DIAGNOSIS — M6281 Muscle weakness (generalized): Secondary | ICD-10-CM | POA: Diagnosis not present

## 2022-01-22 DIAGNOSIS — M25512 Pain in left shoulder: Secondary | ICD-10-CM | POA: Diagnosis not present

## 2022-01-24 ENCOUNTER — Telehealth: Payer: Self-pay

## 2022-01-24 DIAGNOSIS — T460X1A Poisoning by cardiac-stimulant glycosides and drugs of similar action, accidental (unintentional), initial encounter: Secondary | ICD-10-CM

## 2022-01-24 LAB — CBC
Hematocrit: 41.1 % (ref 37.5–51.0)
Hemoglobin: 14.2 g/dL (ref 13.0–17.7)
MCH: 31.2 pg (ref 26.6–33.0)
MCHC: 34.5 g/dL (ref 31.5–35.7)
MCV: 90 fL (ref 79–97)
Platelets: 280 10*3/uL (ref 150–450)
RBC: 4.55 x10E6/uL (ref 4.14–5.80)
RDW: 13 % (ref 11.6–15.4)
WBC: 6.8 10*3/uL (ref 3.4–10.8)

## 2022-01-24 LAB — COMPREHENSIVE METABOLIC PANEL
ALT: 9 IU/L (ref 0–44)
AST: 15 IU/L (ref 0–40)
Albumin/Globulin Ratio: 1.7 (ref 1.2–2.2)
Albumin: 4.7 g/dL (ref 3.8–4.8)
Alkaline Phosphatase: 64 IU/L (ref 44–121)
BUN/Creatinine Ratio: 19 (ref 10–24)
BUN: 25 mg/dL (ref 8–27)
Bilirubin Total: 0.3 mg/dL (ref 0.0–1.2)
CO2: 24 mmol/L (ref 20–29)
Calcium: 9.7 mg/dL (ref 8.6–10.2)
Chloride: 99 mmol/L (ref 96–106)
Creatinine, Ser: 1.3 mg/dL — ABNORMAL HIGH (ref 0.76–1.27)
Globulin, Total: 2.7 g/dL (ref 1.5–4.5)
Glucose: 179 mg/dL — ABNORMAL HIGH (ref 70–99)
Potassium: 4 mmol/L (ref 3.5–5.2)
Sodium: 140 mmol/L (ref 134–144)
Total Protein: 7.4 g/dL (ref 6.0–8.5)
eGFR: 56 mL/min/{1.73_m2} — ABNORMAL LOW (ref >59)

## 2022-01-24 LAB — PRO B NATRIURETIC PEPTIDE: NT-Pro BNP: 306 pg/mL (ref 0–486)

## 2022-01-24 LAB — DIGOXIN LEVEL: Digoxin, Serum: 2.4 ng/mL (ref 0.5–0.9)

## 2022-01-24 NOTE — Telephone Encounter (Signed)
-----   Message from Georga Bora sent at 01/24/2022  1:08 PM EDT ----- Regarding: Dorette Grate, can you please call this patient!!! I just ran across this.  ----- Message ----- From: Richardo Priest, MD Sent: 01/24/2022   9:57 AM EDT To: Ernest Haber [place]coe; Louie Casa, RN; #  This is from 10 days ago he is digitalis toxic I do not see that he has had any communication from our group please contact him if he has not stop his digoxin immediately and return on Monday for repeat digoxin level.

## 2022-01-25 DIAGNOSIS — M6281 Muscle weakness (generalized): Secondary | ICD-10-CM | POA: Diagnosis not present

## 2022-01-25 DIAGNOSIS — M25512 Pain in left shoulder: Secondary | ICD-10-CM | POA: Diagnosis not present

## 2022-01-28 DIAGNOSIS — T460X1A Poisoning by cardiac-stimulant glycosides and drugs of similar action, accidental (unintentional), initial encounter: Secondary | ICD-10-CM | POA: Diagnosis not present

## 2022-01-29 ENCOUNTER — Telehealth: Payer: Self-pay | Admitting: *Deleted

## 2022-01-29 ENCOUNTER — Ambulatory Visit: Payer: HMO | Attending: Cardiology

## 2022-01-29 DIAGNOSIS — E782 Mixed hyperlipidemia: Secondary | ICD-10-CM | POA: Diagnosis not present

## 2022-01-29 DIAGNOSIS — I4819 Other persistent atrial fibrillation: Secondary | ICD-10-CM | POA: Diagnosis not present

## 2022-01-29 DIAGNOSIS — I25119 Atherosclerotic heart disease of native coronary artery with unspecified angina pectoris: Secondary | ICD-10-CM | POA: Diagnosis not present

## 2022-01-29 DIAGNOSIS — Z79899 Other long term (current) drug therapy: Secondary | ICD-10-CM | POA: Diagnosis not present

## 2022-01-29 DIAGNOSIS — Z7901 Long term (current) use of anticoagulants: Secondary | ICD-10-CM | POA: Diagnosis not present

## 2022-01-29 DIAGNOSIS — I110 Hypertensive heart disease with heart failure: Secondary | ICD-10-CM | POA: Diagnosis not present

## 2022-01-29 DIAGNOSIS — R0602 Shortness of breath: Secondary | ICD-10-CM

## 2022-01-29 LAB — ECHOCARDIOGRAM COMPLETE
Area-P 1/2: 3.53 cm2
Calc EF: 55.6 %
MV M vel: 3.56 m/s
MV Peak grad: 50.7 mmHg
P 1/2 time: 724 msec
S' Lateral: 3 cm
Single Plane A2C EF: 55.2 %
Single Plane A4C EF: 54.1 %

## 2022-01-29 LAB — DIGOXIN LEVEL: Digoxin, Serum: 0.6 ng/mL (ref 0.5–0.9)

## 2022-01-29 NOTE — Telephone Encounter (Signed)
Called and informed patient of digoxin level wnl and that he is not to restart.  Discontinued digoxin from medication list.  Pt voices understanding.

## 2022-01-29 NOTE — Telephone Encounter (Signed)
-----   Message from Richardo Priest, MD sent at 01/29/2022  7:32 AM EDT ----- Digoxin level is now normal he should not restart.

## 2022-01-30 DIAGNOSIS — M25512 Pain in left shoulder: Secondary | ICD-10-CM | POA: Diagnosis not present

## 2022-01-30 DIAGNOSIS — M6281 Muscle weakness (generalized): Secondary | ICD-10-CM | POA: Diagnosis not present

## 2022-02-01 DIAGNOSIS — M25512 Pain in left shoulder: Secondary | ICD-10-CM | POA: Diagnosis not present

## 2022-02-01 DIAGNOSIS — M6281 Muscle weakness (generalized): Secondary | ICD-10-CM | POA: Diagnosis not present

## 2022-02-04 ENCOUNTER — Telehealth: Payer: Self-pay | Admitting: Cardiology

## 2022-02-04 ENCOUNTER — Telehealth: Payer: Self-pay

## 2022-02-04 DIAGNOSIS — M25512 Pain in left shoulder: Secondary | ICD-10-CM | POA: Diagnosis not present

## 2022-02-04 DIAGNOSIS — M6281 Muscle weakness (generalized): Secondary | ICD-10-CM | POA: Diagnosis not present

## 2022-02-04 NOTE — Telephone Encounter (Signed)
Results reviewed with pt as per Dr. Munley's note.  Pt verbalized understanding and had no additional questions. Routed to PCP  

## 2022-02-04 NOTE — Telephone Encounter (Signed)
Patient returning call for monitor results. 

## 2022-02-08 DIAGNOSIS — M25512 Pain in left shoulder: Secondary | ICD-10-CM | POA: Diagnosis not present

## 2022-02-08 DIAGNOSIS — M6281 Muscle weakness (generalized): Secondary | ICD-10-CM | POA: Diagnosis not present

## 2022-02-11 ENCOUNTER — Telehealth: Payer: Self-pay | Admitting: Cardiology

## 2022-02-11 DIAGNOSIS — M25512 Pain in left shoulder: Secondary | ICD-10-CM | POA: Diagnosis not present

## 2022-02-11 DIAGNOSIS — M6281 Muscle weakness (generalized): Secondary | ICD-10-CM | POA: Diagnosis not present

## 2022-02-11 MED ORDER — METOPROLOL SUCCINATE ER 25 MG PO TB24: 25.0000 mg | ORAL_TABLET | Freq: Three times a day (TID) | ORAL | 2 refills | Status: DC

## 2022-02-11 NOTE — Telephone Encounter (Signed)
Patient's wife is here because due to the monitor results Dr. Bettina Gavia upped patient's Metoprolol to three times a day but did not send in a Rx to reflect this change. Please send in prescription to [facility] in [place].  Best number to call once completed- [phone]  Please call once sent.  Thank you

## 2022-02-11 NOTE — Telephone Encounter (Signed)
Prescription sent in for the prescription change of his metoprolol due to his monitor results.  I called and let the patient know it had been sent.

## 2022-02-15 DIAGNOSIS — M25512 Pain in left shoulder: Secondary | ICD-10-CM | POA: Diagnosis not present

## 2022-02-15 DIAGNOSIS — M6281 Muscle weakness (generalized): Secondary | ICD-10-CM | POA: Diagnosis not present

## 2022-02-17 ENCOUNTER — Other Ambulatory Visit: Payer: Self-pay | Admitting: Cardiology

## 2022-02-18 DIAGNOSIS — M25512 Pain in left shoulder: Secondary | ICD-10-CM | POA: Diagnosis not present

## 2022-02-18 DIAGNOSIS — M6281 Muscle weakness (generalized): Secondary | ICD-10-CM | POA: Diagnosis not present

## 2022-02-18 NOTE — Telephone Encounter (Signed)
Refill was sent to pharmacy 1 week ago 02/11/22 # 180 tablets

## 2022-02-18 NOTE — Telephone Encounter (Signed)
Refills to pharmacy 

## 2022-02-22 DIAGNOSIS — M6281 Muscle weakness (generalized): Secondary | ICD-10-CM | POA: Diagnosis not present

## 2022-02-22 DIAGNOSIS — M25512 Pain in left shoulder: Secondary | ICD-10-CM | POA: Diagnosis not present

## 2022-03-26 DIAGNOSIS — Z125 Encounter for screening for malignant neoplasm of prostate: Secondary | ICD-10-CM | POA: Diagnosis not present

## 2022-03-26 DIAGNOSIS — K219 Gastro-esophageal reflux disease without esophagitis: Secondary | ICD-10-CM | POA: Diagnosis not present

## 2022-03-26 DIAGNOSIS — M5431 Sciatica, right side: Secondary | ICD-10-CM | POA: Diagnosis not present

## 2022-03-26 DIAGNOSIS — N400 Enlarged prostate without lower urinary tract symptoms: Secondary | ICD-10-CM | POA: Diagnosis not present

## 2022-03-26 DIAGNOSIS — M549 Dorsalgia, unspecified: Secondary | ICD-10-CM | POA: Diagnosis not present

## 2022-03-26 DIAGNOSIS — G8929 Other chronic pain: Secondary | ICD-10-CM | POA: Diagnosis not present

## 2022-03-26 DIAGNOSIS — E7800 Pure hypercholesterolemia, unspecified: Secondary | ICD-10-CM | POA: Diagnosis not present

## 2022-03-26 DIAGNOSIS — I25118 Atherosclerotic heart disease of native coronary artery with other forms of angina pectoris: Secondary | ICD-10-CM | POA: Diagnosis not present

## 2022-03-26 DIAGNOSIS — Z79899 Other long term (current) drug therapy: Secondary | ICD-10-CM | POA: Diagnosis not present

## 2022-03-26 DIAGNOSIS — N6310 Unspecified lump in the right breast, unspecified quadrant: Secondary | ICD-10-CM | POA: Diagnosis not present

## 2022-03-26 DIAGNOSIS — E1169 Type 2 diabetes mellitus with other specified complication: Secondary | ICD-10-CM | POA: Diagnosis not present

## 2022-03-26 DIAGNOSIS — I4891 Unspecified atrial fibrillation: Secondary | ICD-10-CM | POA: Diagnosis not present

## 2022-03-26 DIAGNOSIS — G4700 Insomnia, unspecified: Secondary | ICD-10-CM | POA: Diagnosis not present

## 2022-04-08 ENCOUNTER — Ambulatory Visit: Payer: PPO | Attending: Cardiology | Admitting: Cardiology

## 2022-04-08 ENCOUNTER — Encounter: Payer: Self-pay | Admitting: Cardiology

## 2022-04-08 VITALS — BP 130/72 | HR 148 | Ht 66.0 in | Wt 202.0 lb

## 2022-04-08 DIAGNOSIS — Z955 Presence of coronary angioplasty implant and graft: Secondary | ICD-10-CM | POA: Diagnosis not present

## 2022-04-08 DIAGNOSIS — I2510 Atherosclerotic heart disease of native coronary artery without angina pectoris: Secondary | ICD-10-CM | POA: Diagnosis not present

## 2022-04-08 DIAGNOSIS — I10 Essential (primary) hypertension: Secondary | ICD-10-CM | POA: Diagnosis not present

## 2022-04-08 DIAGNOSIS — J9811 Atelectasis: Secondary | ICD-10-CM | POA: Diagnosis not present

## 2022-04-08 DIAGNOSIS — D500 Iron deficiency anemia secondary to blood loss (chronic): Secondary | ICD-10-CM | POA: Diagnosis not present

## 2022-04-08 DIAGNOSIS — I252 Old myocardial infarction: Secondary | ICD-10-CM | POA: Diagnosis not present

## 2022-04-08 DIAGNOSIS — R0600 Dyspnea, unspecified: Secondary | ICD-10-CM | POA: Insufficient documentation

## 2022-04-08 DIAGNOSIS — I4819 Other persistent atrial fibrillation: Secondary | ICD-10-CM

## 2022-04-08 DIAGNOSIS — Z79899 Other long term (current) drug therapy: Secondary | ICD-10-CM | POA: Diagnosis not present

## 2022-04-08 DIAGNOSIS — M549 Dorsalgia, unspecified: Secondary | ICD-10-CM | POA: Diagnosis not present

## 2022-04-08 DIAGNOSIS — I110 Hypertensive heart disease with heart failure: Secondary | ICD-10-CM

## 2022-04-08 DIAGNOSIS — Z7902 Long term (current) use of antithrombotics/antiplatelets: Secondary | ICD-10-CM | POA: Diagnosis not present

## 2022-04-08 DIAGNOSIS — Z7901 Long term (current) use of anticoagulants: Secondary | ICD-10-CM | POA: Diagnosis not present

## 2022-04-08 DIAGNOSIS — I361 Nonrheumatic tricuspid (valve) insufficiency: Secondary | ICD-10-CM | POA: Diagnosis not present

## 2022-04-08 DIAGNOSIS — I25119 Atherosclerotic heart disease of native coronary artery with unspecified angina pectoris: Secondary | ICD-10-CM | POA: Diagnosis not present

## 2022-04-08 DIAGNOSIS — G8929 Other chronic pain: Secondary | ICD-10-CM | POA: Diagnosis not present

## 2022-04-08 DIAGNOSIS — D509 Iron deficiency anemia, unspecified: Secondary | ICD-10-CM | POA: Diagnosis not present

## 2022-04-08 DIAGNOSIS — Z7982 Long term (current) use of aspirin: Secondary | ICD-10-CM | POA: Diagnosis not present

## 2022-04-08 DIAGNOSIS — Z885 Allergy status to narcotic agent status: Secondary | ICD-10-CM | POA: Diagnosis not present

## 2022-04-08 DIAGNOSIS — R0602 Shortness of breath: Secondary | ICD-10-CM | POA: Diagnosis not present

## 2022-04-08 DIAGNOSIS — Z87891 Personal history of nicotine dependence: Secondary | ICD-10-CM | POA: Diagnosis not present

## 2022-04-08 DIAGNOSIS — E119 Type 2 diabetes mellitus without complications: Secondary | ICD-10-CM | POA: Diagnosis not present

## 2022-04-08 DIAGNOSIS — I4891 Unspecified atrial fibrillation: Secondary | ICD-10-CM | POA: Diagnosis not present

## 2022-04-08 DIAGNOSIS — Z8719 Personal history of other diseases of the digestive system: Secondary | ICD-10-CM | POA: Diagnosis not present

## 2022-04-08 NOTE — Patient Instructions (Signed)
Medication Instructions:  No medication changes. *If you need a refill on your cardiac medications before your next appointment, please call your pharmacy*   Lab Work: None ordered If you have labs (blood work) drawn today and your tests are completely normal, you will receive your results only by: [place] (if you have MyChart) OR A paper copy in the mail If you have any lab test that is abnormal or we need to change your treatment, we will call you to review the results.   Testing/Procedures: None ordered   Follow-Up: At [facility], you and your health needs are our priority.  As part of our continuing mission to provide you with exceptional heart care, we have created designated Provider Care Teams.  These Care Teams include your primary Cardiologist (physician) and Advanced Practice Providers (APPs -  Physician Assistants and Nurse Practitioners) who all work together to provide you with the care you need, when you need it.  We recommend signing up for the patient portal called "MyChart".  Sign up information is provided on this After Visit Summary.  MyChart is used to connect with patients for Virtual Visits (Telemedicine).  Patients are able to view lab/test results, encounter notes, upcoming appointments, etc.  Non-urgent messages can be sent to your provider as well.   To learn more about what you can do with MyChart, go to [email or web address].    Your next appointment:   3 week(s)  The format for your next appointment:   In Person  Provider:   Shirlee More, MD   Other Instructions NA

## 2022-04-08 NOTE — Progress Notes (Signed)
Cardiology Office Note:    Date:  04/08/2022   ID:  Andrew Hardy., DOB 03-07-1941, MRN [id number]  PCP:  Cyndi Bender, PA-C  Cardiologist:  Shirlee More, MD    Referring MD: Cyndi Bender, PA-C    ASSESSMENT:    1. Persistent atrial fibrillation ([place])   2. Chronic anticoagulation   3. Hypertensive heart disease with heart failure ([place])   4. Coronary artery disease involving native coronary artery of native heart with angina pectoris ([place])   5. Iron deficiency anemia due to chronic blood loss    PLAN:    In order of problems listed above:  He is doing very poorly with rapid uncontrolled atrial fibrillation and is highly symptomatic.  I think the best course is to continue his beta-blocker treated initially with IV calcium channel blocker and transition to oral and achieve heart rate control and in hospital setting.  We could try restarting digoxin but he developed clinical toxicity before and amiodarone Check for labs including thyroids his wife seems very heat intolerant he has muscle weakness although he has no known history of thyroid disease as well as a CBC with his previous severe symptomatic anemia although has had no obvious bleeding. Disease is stable continue his combined antiplatelet and anticoagulant therapy   Next appointment: To be decided after hospitalization   Medication Adjustments/Labs and Tests Ordered: Current medicines are reviewed at length with the patient today.  Concerns regarding medicines are outlined above.  No orders of the defined types were placed in this encounter.  No orders of the defined types were placed in this encounter.   Chief client worked in the my office hours today for shortness of breath   History of Present Illness:    Andrew Hardy. is a 81 y.o. male with a hx of complex heart disease including CAD hypertensive heart disease with heart failure chronic anticoagulation persistent atrial fibrillation iron deficiency anemia  previous GI bleeding and most recent coronary PCI 07/21/2021 with PCI and stent to the distal left circumflex coronary artery last seen 01/14/2022 at that time his digoxin level was toxic proBNP level was low.  His echocardiogram after that visit showed low normal EF 50 to 55% x 3 D 53% normal right ventricular size and function pulmonary artery pressure.  Mild mitral regurgitation.  Compliance with diet, lifestyle and medications: Yes  He has deteriorated in the last weeks is very badly he is weak and fatigued with any activity he is having lightheaded episodes he is short of breath with any activity but is not having edema orthopnea chest pain.  He is in very rapid atrial fibrillation with heart rate of 148 bpm at rest.  Last visit digoxin was discontinued with toxicity.  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 Past Medical History:  Diagnosis Date   Anxiety    Arthritis    Atypical atrial flutter ([place]) 12/14/2014   Bradycardia, sinus 10/15/2014   Burning sensation of feet 01/17/2016   CAD, multiple vessel 10/18/2014   Overview:  DES to [facility] for unstable angina 10/17/14 Diagnostic Summary Severe stenosis of mid Cx likely cause of recent accelerating angina Diffuse severe stenosis of small caliber LAD unchanged from 2012 Normal LV function Paroxysmal SVT noted during procedure 04/23/16:  PCI / Resolute Drug Eluting Stent of the proximal Left Anterior Descending Coronary Artery. Successful PTCA of the ostial 1st Diagonal Coronary Artery   Chronic left-sided low back pain with sciatica 02/21/2016   Coronary artery disease  2003   s/p MI and numerous stents   Coronary artery disease involving native coronary artery of native heart with angina pectoris ([place]) 10/18/2014   Overview:  DES to [facility] for unstable angina 10/17/14 Diagnostic Summary Severe stenosis of mid Cx likely cause of recent accelerating angina Diffuse severe stenosis of small caliber LAD unchanged from 2012 Normal LV function  Paroxysmal SVT noted during procedure 04/23/16:  PCI / Resolute Drug Eluting Stent of the proximal Left Anterior Descending Coronary Artery. 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   Degenerative spondylolisthesis 05/02/2017   Depression    Dyspnea    WITH EXERTION   Enlarged prostate without lower urinary tract symptoms (luts) 2012   frequent urination and nocturia; sees dr [name] Late in [place]   Essential hypertension 10/15/2014   Gastroesophageal reflux disease without esophagitis 04/21/2016   GERD (gastroesophageal reflux disease)    Hyperlipidemia    Hypertension    Hypertensive heart disease with heart failure ([place]) 12/20/2016   IHD (ischemic heart disease) 10/15/2014   Lower limb pain, inferior, right 12/17/2017   Lumbar radiculopathy 02/21/2016   Myocardial infarction [facility]) 2003   NSTEMI (non-ST elevated myocardial infarction) ([place]) 04/24/2016   Overview:  LHC 04/24/15: Severe stenosis of the LAD & Diagonal bifurcation  LV ejection fraction is 55-60 %  Interventional Summary Successful Angiosculpt PCI / Resolute Drug Eluting Stent of the proximal LAD Successful PTCA of the ostial 1st Diagonal Coronary Artery   Paroxysmal SVT (supraventricular tachycardia) 11/16/2014   Precordial pain 10/15/2014   PVC's (premature ventricular contractions) 04/22/2016   RBBB 10/15/2014   AFIB   S/P angioplasty with stent 08/10/21 with DES to mid OM1 and distal LCX-OM2 08/11/2021   S/P lumbar fusion 04/24/2017   Sacroiliac joint pain 09/10/2018   Sleep disturbances 01/17/2016   Snoring 01/17/2016   Somatic dysfunction of left sacroiliac joint 06/19/2018   Subscapularis (muscle) sprain 03/29/2011   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    Past Surgical History:  Procedure Laterality Date   BIOPSY  04/24/2020   Procedure: BIOPSY;  Surgeon: Jerene Bears, MD;  Location: [place];  Service: Gastroenterology;;   CARDIAC  CATHETERIZATION  10-2010   [place]ca/[place] in [place]   [place]  04/23/2016   COLONOSCOPY WITH PROPOFOL N/A 04/24/2020   Procedure: COLONOSCOPY WITH PROPOFOL;  Surgeon: Jerene Bears, MD;  Location: [place];  Service: Gastroenterology;  Laterality: N/A;   CORONARY ANGIOPLASTY     CORONARY STENT INTERVENTION N/A 08/10/2021   Procedure: CORONARY STENT INTERVENTION;  Surgeon: Leonie Man, MD;  Location: [place] CV LAB;  Service: Cardiovascular;  Laterality: N/A;   ESOPHAGOGASTRODUODENOSCOPY (EGD) WITH PROPOFOL N/A 04/24/2020   Procedure: ESOPHAGOGASTRODUODENOSCOPY (EGD) WITH PROPOFOL;  Surgeon: Jerene Bears, MD;  Location: [place];  Service: Gastroenterology;  Laterality: N/A;   EYE SURGERY  05-1010   bil cataract ,iol   FRACTURE SURGERY     HEMOSTASIS CONTROL  04/24/2020   Procedure: HEMOSTASIS CONTROL;  Surgeon: Jerene Bears, MD;  Location: [facility]COPY;  Service: Gastroenterology;;   I & D EXTREMITY Right 02/23/2015   Procedure: RIGHT RING FINGER REVISION AMPUTATION;  Surgeon: Leanora Cover, MD;  Location: [place];  Service: Orthopedics;  Laterality: Right;   KNEE ARTHROSCOPY W/ MENISCAL REPAIR  2000   left knee   LEFT HEART CATH AND CORONARY ANGIOGRAPHY N/A 08/10/2021   Procedure: LEFT HEART CATH AND CORONARY ANGIOGRAPHY;  Surgeon: Leonie Man,  MD;  Location: [place] CV [facility];  Service: Cardiovascular;  Laterality: N/A;   LUMBAR EPIDURAL INJECTION  2011   low back pain; DDD   POLYPECTOMY  04/24/2020   Procedure: POLYPECTOMY;  Surgeon: Jerene Bears, MD;  [facility];  Service: Gastroenterology;;   REVERSE SHOULDER ARTHROPLASTY Left 11/23/2021   Procedure: REVERSE SHOULDER ARTHROPLASTY;  Surgeon: [name], MD;  Location: [facility];  Service: Orthopedics;  Laterality: Left;   ring finger tramatic qamputation Right    SHOULDER ARTHROSCOPY  03/29/2011   Procedure: ARTHROSCOPY SHOULDER;  Surgeon: Augustin Schooling;  Location: [place];  Service: Orthopedics;   Laterality: Right;  RIGHT SHOULDER ARTHROSCOPY WITH OPEN SUBSCAPULAR REPAIR   SHOULDER OPEN ROTATOR CUFF REPAIR     3 on rt shoulder; 2 on left   TRANSFORAMINAL LUMBAR INTERBODY FUSION (TLIF) WITH PEDICLE SCREW FIXATION 1 LEVEL N/A 04/24/2017   Procedure: TRANSFORAMINAL LUMBAR INTERBODY FUSION (TLIF) L4-5;  Surgeon: Melina Schools, MD;  Location: [place];  Service: Orthopedics;  Laterality: N/A;  4 hrs    Current Medications: Current Meds  Medication Sig   acetaminophen (TYLENOL) 325 MG tablet Take 2 tablets (650 mg total) by mouth every 6 (six) hours as needed (pain).   Ascorbic Acid (VITAMIN C) 1000 MG tablet Take 1,000 mg by mouth daily.   clopidogrel (PLAVIX) 75 MG tablet Take 1 tablet (75 mg total) by mouth daily with breakfast.   CO-ENZYME Q-10 PO Take 50 mg by mouth daily.   Cyanocobalamin (B-12 PO) Take 1 tablet by mouth daily.   DULoxetine (CYMBALTA) 30 MG capsule Take 30 mg by mouth 2 (two) times daily.   ezetimibe (ZETIA) 10 MG tablet TAKE 1 TABLET(10 MG) BY MOUTH EVERY EVENING (Patient taking differently: Take 10 mg by mouth daily.)   finasteride (PROSCAR) 5 MG tablet Take 5 mg by mouth daily.   Flaxseed, Linseed, (FLAXSEED OIL MAX STR) 1300 MG CAPS Take 1,300 mg by mouth daily.   furosemide (LASIX) 80 MG tablet Take 1 tablet (80 mg total) by mouth in the morning.   gemfibrozil (LOPID) 600 MG tablet TAKE 1/2 TABLET(300 MG) BY MOUTH TWICE DAILY BEFORE A MEAL (Patient taking differently: Take 600 mg by mouth 2 (two) times daily before a meal.)   Glucosamine HCl (GLUCOSAMINE PO) Take 1 tablet by mouth 2 (two) times daily.   isosorbide mononitrate (IMDUR) 60 MG 24 hr tablet TAKE 1 TABLET(60 MG) BY MOUTH DAILY   methocarbamol (ROBAXIN) 500 MG tablet Take 1 tablet (500 mg total) by mouth every 6 (six) hours as needed for muscle spasms.   metoprolol succinate (TOPROL-XL) 25 MG 24 hr tablet Take 1 tablet (25 mg total) by mouth 3 (three) times daily.   minocycline (MINOCIN,DYNACIN) 50 MG  capsule Take 50 mg by mouth daily.   Multiple Vitamins-Minerals (MULTIVITAMINS THER. W/MINERALS) TABS Take 1 tablet by mouth daily.   nitroGLYCERIN (NITROSTAT) 0.4 MG SL tablet Place 1 tablet (0.4 mg total) under the tongue every 5 (five) minutes as needed for chest pain.   Omega-3 Fatty Acids (FISH OIL PO) Take 1 tablet by mouth daily.   pantoprazole (PROTONIX) 40 MG tablet Take 1 tablet (40 mg total) by mouth daily.   potassium chloride SA (KLOR-CON M) 20 MEQ tablet TAKE 2 TABLETS BY MOUTH EVERY MORNING AND 1 TABLET EVERY EVENING   PUMPKIN SEED PO Take 1 tablet by mouth daily.   rivaroxaban (XARELTO) 20 MG TABS tablet Take 1 tablet (20 mg total) by mouth daily with  supper. (Patient taking differently: Take 20 mg by mouth at bedtime.)   simvastatin (ZOCOR) 40 MG tablet TAKE 1 TABLET(40 MG) BY MOUTH AT BEDTIME (Patient taking differently: Take 40 mg by mouth daily at 6 PM. TAKE 1 TABLET(40 MG) BY MOUTH AT BEDTIME)   tamsulosin (FLOMAX) 0.4 MG CAPS capsule Take 0.4 mg by mouth daily.   traMADol (ULTRAM) 50 MG tablet Take 2 tablets (100 mg total) by mouth every 6 (six) hours as needed for severe pain or moderate pain.   traZODone (DESYREL) 50 MG tablet Take 50 mg by mouth at bedtime.   TURMERIC CURCUMIN PO Take 1 capsule by mouth daily.     Allergies:   Amiodarone, Sotalol hcl, Buprenorphine hcl, Codeine, Hydrocodone, and Percocet [oxycodone-acetaminophen]   Social History   Socioeconomic History   Marital status: Married    Spouse name: Not on file   Number of children: Not on file   Years of education: Not on file   Highest education level: Not on file  Occupational History   Not on file  Tobacco Use   Smoking status: Former    Packs/day: 1.00    Years: 20.00    Total pack years: 20.00    Types: Cigarettes    Quit date: 03/20/1994    Years since quitting: 28.0   Smokeless tobacco: Former    Types: Chew    Quit date: 03/20/1996  Vaping Use   Vaping Use: Never used  Substance  and Sexual Activity   Alcohol use: Yes    Alcohol/week: 6.0 standard drinks of alcohol    Types: 6 Cans of beer per week    Comment: BEER- DAILY   Drug use: No   Sexual activity: Not on file  Other Topics Concern   Not on file  Social History Narrative   Not on file   Social Determinants of Health   Financial Resource Strain: Not on file  Food Insecurity: Not on file  Transportation Needs: Not on file  Physical Activity: Not on file  Stress: Not on file  Social Connections: Not on file     Family History: The patient's family history includes Cancer in his father and sister; Heart disease in his father and mother. ROS:   Please see the history of present illness.    All other systems reviewed and are negative.  EKGs/Labs/Other Studies Reviewed:    The following studies were reviewed today:  EKG:  EKG ordered today and personally reviewed.  The ekg ordered today demonstrates atrial fibrillation rapid ventricular rate 148 bpm right bundle branch block I explained normal  Recent Labs: 01/14/2022: ALT 9; BUN 25; Creatinine, Ser 1.30; Hemoglobin 14.2; NT-Pro BNP 306; Platelets 280; Potassium 4.0; Sodium 140  Recent Lipid Panel    Component Value Date/Time   CHOL 184 03/12/2021 0820   TRIG 318 (H) 03/12/2021 0820   HDL 48 03/12/2021 0820   CHOLHDL 3.8 03/12/2021 0820   LDLCALC 84 03/12/2021 0820    Physical Exam:    VS:  BP 130/72 (BP Location: Right Arm, Patient Position: Sitting)   Pulse (!) 148   Ht '5\' 6"'$  (1.676 m)   Wt 202 lb (91.6 kg)   SpO2 99%   BMI 32.60 kg/m     Wt Readings from Last 3 Encounters:  04/08/22 202 lb (91.6 kg)  01/14/22 204 lb 12.8 oz (92.9 kg)  11/23/21 207 lb (93.9 kg)     GEN:  Well nourished, well developed in no acute distress HEENT:  Normal NECK: No JVD; No carotid bruits LYMPHATICS: No lymphadenopathy CARDIAC: rapid irregular no murmurs, rubs, gallops RESPIRATORY:  Clear to auscultation without rales, wheezing or rhonchi   ABDOMEN: Soft, non-tender, non-distended MUSCULOSKELETAL:  No edema; No deformity  SKIN: Warm and dry NEUROLOGIC:  Alert and oriented x 3 PSYCHIATRIC:  Normal affect    Signed, Shirlee More, MD  04/08/2022 2:44 PM    [place]

## 2022-04-09 DIAGNOSIS — I4891 Unspecified atrial fibrillation: Secondary | ICD-10-CM

## 2022-04-09 DIAGNOSIS — I10 Essential (primary) hypertension: Secondary | ICD-10-CM

## 2022-04-09 DIAGNOSIS — I361 Nonrheumatic tricuspid (valve) insufficiency: Secondary | ICD-10-CM

## 2022-04-09 DIAGNOSIS — I2510 Atherosclerotic heart disease of native coronary artery without angina pectoris: Secondary | ICD-10-CM

## 2022-04-10 DIAGNOSIS — I4891 Unspecified atrial fibrillation: Secondary | ICD-10-CM | POA: Diagnosis not present

## 2022-04-10 DIAGNOSIS — I2510 Atherosclerotic heart disease of native coronary artery without angina pectoris: Secondary | ICD-10-CM | POA: Diagnosis not present

## 2022-04-10 DIAGNOSIS — I10 Essential (primary) hypertension: Secondary | ICD-10-CM | POA: Diagnosis not present

## 2022-04-11 DIAGNOSIS — N6489 Other specified disorders of breast: Secondary | ICD-10-CM | POA: Diagnosis not present

## 2022-04-11 DIAGNOSIS — N6315 Unspecified lump in the right breast, overlapping quadrants: Secondary | ICD-10-CM | POA: Diagnosis not present

## 2022-04-11 DIAGNOSIS — R928 Other abnormal and inconclusive findings on diagnostic imaging of breast: Secondary | ICD-10-CM | POA: Diagnosis not present

## 2022-04-23 ENCOUNTER — Ambulatory Visit: Payer: Self-pay | Admitting: Cardiology

## 2022-04-30 DIAGNOSIS — L821 Other seborrheic keratosis: Secondary | ICD-10-CM | POA: Diagnosis not present

## 2022-04-30 DIAGNOSIS — L578 Other skin changes due to chronic exposure to nonionizing radiation: Secondary | ICD-10-CM | POA: Diagnosis not present

## 2022-04-30 NOTE — Progress Notes (Unsigned)
Cardiology Office Note:    Date:  04/30/2022   ID:  Andrew Hardy., DOB [birth year], MRN [id number]  PCP:  Cyndi Bender, PA-C  Cardiologist:  Shirlee More, MD    Referring MD: Cyndi Bender, PA-C    ASSESSMENT:    No diagnosis found. PLAN:    In order of problems listed above:  ***   Next appointment: ***   Medication Adjustments/Labs and Tests Ordered: Current medicines are reviewed at length with the patient today.  Concerns regarding medicines are outlined above.  No orders of the defined types were placed in this encounter.  No orders of the defined types were placed in this encounter.   No chief complaint on file.   History of Present Illness:    Andrew Capote. is a 82 y.o. male with a hx of complex heart disease including CAD hypertensive heart disease with heart failure chronic anticoagulation with persistent atrial fibrillation iron deficiency anemia previous GI bleeding and most recently 07/21/2021 PCI and stent distal left circumflex coronary artery.  He was last seen 01/14/2022.  He was quite symptomatic short of breath with rapid uncontrolled atrial fibrillation and arrangements are made for hospitalization for treatment of heart failure and rate control with atrial fibrillation.  Compliance with diet, lifestyle and medications: ***  Echocardiogram [facility] 04/09/2022 showed preserved EF 55 to 60% mild concentric LVH moderate left and mild right atrial enlargement. Past Medical History:  Diagnosis Date   Anxiety    Arthritis    Atypical atrial flutter ([place]) 12/14/2014   Bradycardia, sinus 10/15/2014   Burning sensation of feet 01/17/2016   CAD, multiple vessel 10/18/2014   Overview:  DES to [facility] for unstable angina 10/17/14 Diagnostic Summary Severe stenosis of mid Cx likely cause of recent accelerating angina Diffuse severe stenosis of small caliber LAD unchanged from 2012 Normal LV function Paroxysmal SVT noted during procedure 04/23/16:  PCI /  Resolute Drug Eluting Stent of the proximal Left Anterior Descending Coronary Artery. Successful PTCA of the ostial 1st Diagonal Coronary Artery   Chronic left-sided low back pain with sciatica 02/21/2016   Coronary artery disease 2003   s/p MI and numerous stents   Coronary artery disease involving native coronary artery of native heart with angina pectoris ([place]) 10/18/2014   Overview:  DES to [facility] for unstable angina 10/17/14 Diagnostic Summary Severe stenosis of mid Cx likely cause of recent accelerating angina Diffuse severe stenosis of small caliber LAD unchanged from 2012 Normal LV function Paroxysmal SVT noted during procedure 04/23/16:  PCI / Resolute Drug Eluting Stent of the proximal Left Anterior Descending Coronary Artery. 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   Degenerative spondylolisthesis 05/02/2017   Depression    Dyspnea    WITH EXERTION   Enlarged prostate without lower urinary tract symptoms (luts) 2012   frequent urination and nocturia; sees dr [name] Late in [place]   Essential hypertension 10/15/2014   Gastroesophageal reflux disease without esophagitis 04/21/2016   GERD (gastroesophageal reflux disease)    Hyperlipidemia    Hypertension    Hypertensive heart disease with heart failure ([place]) 12/20/2016   IHD (ischemic heart disease) 10/15/2014   Lower limb pain, inferior, right 12/17/2017   Lumbar radiculopathy 02/21/2016   Myocardial infarction [facility]) 2003   NSTEMI (non-ST elevated myocardial infarction) ([place]) 04/24/2016   Overview:  LHC 04/24/15: Severe stenosis of the LAD & Diagonal bifurcation  LV ejection fraction is 55-60 %  Interventional  Summary Successful Angiosculpt PCI / Resolute Drug Eluting Stent of the proximal LAD Successful PTCA of the ostial 1st Diagonal Coronary Artery   Paroxysmal SVT (supraventricular tachycardia) 11/16/2014   Precordial pain 10/15/2014   PVC's (premature ventricular contractions)  04/22/2016   RBBB 10/15/2014   AFIB   S/P angioplasty with stent 08/10/21 with DES to mid OM1 and distal LCX-OM2 08/11/2021   S/P lumbar fusion 04/24/2017   Sacroiliac joint pain 09/10/2018   Sleep disturbances 01/17/2016   Snoring 01/17/2016   Somatic dysfunction of left sacroiliac joint 06/19/2018   Subscapularis (muscle) sprain 03/29/2011   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    Past Surgical History:  Procedure Laterality Date   BIOPSY  04/24/2020   Procedure: BIOPSY;  Surgeon: Jerene Bears, MD;  Location: [place];  Service: Gastroenterology;;   CARDIAC CATHETERIZATION  10-2010   [place]ca/[place]   CARDIAC CATHETERIZATION  04/23/2016   COLONOSCOPY WITH PROPOFOL N/A 04/24/2020   Procedure: COLONOSCOPY WITH PROPOFOL;  Surgeon: Jerene Bears, MD;  Location: [place];  Service: Gastroenterology;  Laterality: N/A;   CORONARY ANGIOPLASTY     CORONARY STENT INTERVENTION N/A 08/10/2021   Procedure: CORONARY STENT INTERVENTION;  Surgeon: Leonie Man, MD;  Location: [place] CV LAB;  Service: Cardiovascular;  Laterality: N/A;   ESOPHAGOGASTRODUODENOSCOPY (EGD) WITH PROPOFOL N/A 04/24/2020   Procedure: ESOPHAGOGASTRODUODENOSCOPY (EGD) WITH PROPOFOL;  Surgeon: Jerene Bears, MD;  Location: [place];  Service: Gastroenterology;  Laterality: N/A;   EYE SURGERY  05-1010   bil cataract ,iol   FRACTURE SURGERY     HEMOSTASIS CONTROL  04/24/2020   Procedure: HEMOSTASIS CONTROL;  Surgeon: Jerene Bears, MD;  Location: [facility] ENDOSCOPY;  Service: Gastroenterology;;   I & D EXTREMITY Right 02/23/2015   Procedure: RIGHT RING FINGER REVISION AMPUTATION;  Surgeon: Leanora Cover, MD;  Location: [place];  Service: Orthopedics;  Laterality: Right;   KNEE ARTHROSCOPY W/ MENISCAL REPAIR  2000   left knee   LEFT HEART CATH AND CORONARY ANGIOGRAPHY N/A 08/10/2021   Procedure: LEFT HEART CATH AND CORONARY ANGIOGRAPHY;  Surgeon: Leonie Man, MD;  Location: [place] CV LAB;   Service: Cardiovascular;  Laterality: N/A;   LUMBAR EPIDURAL INJECTION  2011   low back pain; DDD   POLYPECTOMY  04/24/2020   Procedure: POLYPECTOMY;  Surgeon: Jerene Bears, MD;  Location: [facility] ENDOSCOPY;  Service: Gastroenterology;;   REVERSE SHOULDER ARTHROPLASTY Left 11/23/2021   Procedure: REVERSE SHOULDER ARTHROPLASTY;  Surgeon: [name], MD;  [facility];  Service: Orthopedics;  Laterality: Left;   ring finger tramatic qamputation Right    SHOULDER ARTHROSCOPY  03/29/2011   Procedure: ARTHROSCOPY SHOULDER;  Surgeon: [place] Schooling;  Location: [place];  Service: Orthopedics;  Laterality: Right;  RIGHT SHOULDER ARTHROSCOPY WITH OPEN SUBSCAPULAR REPAIR   SHOULDER OPEN ROTATOR CUFF REPAIR     3 on rt shoulder; 2 on left   TRANSFORAMINAL LUMBAR INTERBODY FUSION (TLIF) WITH PEDICLE SCREW FIXATION 1 LEVEL N/A 04/24/2017   Procedure: TRANSFORAMINAL LUMBAR INTERBODY FUSION (TLIF) L4-5;  Surgeon: Melina Schools, MD;  Location: [place];  Service: Orthopedics;  Laterality: N/A;  4 hrs    Current Medications: No outpatient medications have been marked as taking for the 05/01/22 encounter (Appointment) with Richardo Priest, MD.     Allergies:   Amiodarone, Sotalol hcl, Buprenorphine hcl, Codeine, Hydrocodone, and Percocet [oxycodone-acetaminophen]   Social History   Socioeconomic History   Marital status: Married  Spouse name: Not on file   Number of children: Not on file   Years of education: Not on file   Highest education level: Not on file  Occupational History   Not on file  Tobacco Use   Smoking status: Former    Packs/day: 1.00    Years: 20.00    Total pack years: 20.00    Types: Cigarettes    Quit date: 03/20/1994    Years since quitting: 28.1   Smokeless tobacco: Former    Types: Chew    Quit date: 03/20/1996  Vaping Use   Vaping Use: Never used  Substance and Sexual Activity   Alcohol use: Yes    Alcohol/week: 6.0 standard drinks of alcohol    Types: 6 Cans of beer  per week    Comment: BEER- DAILY   Drug use: No   Sexual activity: Not on file  Other Topics Concern   Not on file  Social History Narrative   Not on file   Social Determinants of Health   Financial Resource Strain: Not on file  Food Insecurity: Not on file  Transportation Needs: Not on file  Physical Activity: Not on file  Stress: Not on file  Social Connections: Not on file     Family History: The patient's ***family history includes Cancer in his father and sister; Heart disease in his father and mother. ROS:   Please see the history of present illness.    All other systems reviewed and are negative.  EKGs/Labs/Other Studies Reviewed:    The following studies were reviewed today:  EKG:  EKG ordered today and personally reviewed.  The ekg ordered today demonstrates ***  Recent Labs: 01/14/2022: ALT 9; BUN 25; Creatinine, Ser 1.30; Hemoglobin 14.2; NT-Pro BNP 306; Platelets 280; Potassium 4.0; Sodium 140  Recent Lipid Panel    Component Value Date/Time   CHOL 184 03/12/2021 0820   TRIG 318 (H) 03/12/2021 0820   HDL 48 03/12/2021 0820   CHOLHDL 3.8 03/12/2021 0820   LDLCALC 84 03/12/2021 0820    Physical Exam:    VS:  There were no vitals taken for this visit.    Wt Readings from Last 3 Encounters:  04/08/22 202 lb (91.6 kg)  01/14/22 204 lb 12.8 oz (92.9 kg)  11/23/21 207 lb (93.9 kg)     GEN: *** Well nourished, well developed in no acute distress HEENT: Normal NECK: No JVD; No carotid bruits LYMPHATICS: No lymphadenopathy CARDIAC: ***RRR, no murmurs, rubs, gallops RESPIRATORY:  Clear to auscultation without rales, wheezing or rhonchi  ABDOMEN: Soft, non-tender, non-distended MUSCULOSKELETAL:  No edema; No deformity  SKIN: Warm and dry NEUROLOGIC:  Alert and oriented x 3 PSYCHIATRIC:  Normal affect    Signed, Shirlee More, MD  04/30/2022 5:25 PM    [facility]

## 2022-05-01 ENCOUNTER — Ambulatory Visit: Payer: PPO | Attending: Cardiology | Admitting: Cardiology

## 2022-05-01 ENCOUNTER — Telehealth: Payer: Self-pay | Admitting: Cardiology

## 2022-05-01 ENCOUNTER — Encounter: Payer: Self-pay | Admitting: Cardiology

## 2022-05-01 VITALS — BP 102/60 | HR 90 | Ht 66.0 in | Wt 214.0 lb

## 2022-05-01 DIAGNOSIS — I25119 Atherosclerotic heart disease of native coronary artery with unspecified angina pectoris: Secondary | ICD-10-CM

## 2022-05-01 DIAGNOSIS — E782 Mixed hyperlipidemia: Secondary | ICD-10-CM | POA: Diagnosis not present

## 2022-05-01 DIAGNOSIS — I110 Hypertensive heart disease with heart failure: Secondary | ICD-10-CM | POA: Diagnosis not present

## 2022-05-01 DIAGNOSIS — I4819 Other persistent atrial fibrillation: Secondary | ICD-10-CM | POA: Diagnosis not present

## 2022-05-01 NOTE — Patient Instructions (Signed)
Medication Instructions:  Your physician has recommended you make the following change in your medication:   STOP: Simvastatin  *If you need a refill on your cardiac medications before your next appointment, please call your pharmacy*   Lab Work: None If you have labs (blood work) drawn today and your tests are completely normal, you will receive your results only by: [facility] (if you have MyChart) OR A paper copy in the mail If you have any lab test that is abnormal or we need to change your treatment, we will call you to review the results.   Testing/Procedures: None   Follow-Up: At [facility], you and your health needs are our priority.  As part of our continuing mission to provide you with exceptional heart care, we have created designated Provider Care Teams.  These Care Teams include your primary Cardiologist (physician) and Advanced Practice Providers (APPs -  Physician Assistants and Nurse Practitioners) who all work together to provide you with the care you need, when you need it.  We recommend signing up for the patient portal called "MyChart".  Sign up information is provided on this After Visit Summary.  MyChart is used to connect with patients for Virtual Visits (Telemedicine).  Patients are able to view lab/test results, encounter notes, upcoming appointments, etc.  Non-urgent messages can be sent to your provider as well.   To learn more about what you can do with MyChart, go to [email or web address].    Your next appointment:   3 month(s)  The format for your next appointment:   In Person  Provider:   Shirlee More, MD    Other Instructions None  Important Information About Sugar

## 2022-05-01 NOTE — Telephone Encounter (Signed)
Spoke with patient regarding patient assistance programs for his wife and himself. Explained that the income is limited and will need to get financial statements for each program. Patient voices understanding and states that he will have his wife bring copy of their income and his application for the Xarelto

## 2022-05-01 NOTE — Telephone Encounter (Signed)
Patient's wife called stating they received a call from the office. She doesn't know what it was about, I didn't see anything document in Epic.

## 2022-05-15 DIAGNOSIS — N400 Enlarged prostate without lower urinary tract symptoms: Secondary | ICD-10-CM | POA: Diagnosis not present

## 2022-05-15 DIAGNOSIS — N3941 Urge incontinence: Secondary | ICD-10-CM | POA: Diagnosis not present

## 2022-05-15 DIAGNOSIS — R221 Localized swelling, mass and lump, neck: Secondary | ICD-10-CM | POA: Diagnosis not present

## 2022-05-27 ENCOUNTER — Other Ambulatory Visit: Payer: Self-pay

## 2022-05-28 DIAGNOSIS — Z7901 Long term (current) use of anticoagulants: Secondary | ICD-10-CM | POA: Diagnosis not present

## 2022-05-28 DIAGNOSIS — N401 Enlarged prostate with lower urinary tract symptoms: Secondary | ICD-10-CM | POA: Diagnosis not present

## 2022-05-28 DIAGNOSIS — Z79899 Other long term (current) drug therapy: Secondary | ICD-10-CM | POA: Diagnosis not present

## 2022-06-11 DIAGNOSIS — M25511 Pain in right shoulder: Secondary | ICD-10-CM | POA: Diagnosis not present

## 2022-06-11 DIAGNOSIS — M25512 Pain in left shoulder: Secondary | ICD-10-CM | POA: Diagnosis not present

## 2022-06-11 DIAGNOSIS — Z4789 Encounter for other orthopedic aftercare: Secondary | ICD-10-CM | POA: Diagnosis not present

## 2022-06-17 ENCOUNTER — Other Ambulatory Visit: Payer: Self-pay | Admitting: Cardiology

## 2022-06-23 ENCOUNTER — Other Ambulatory Visit: Payer: Self-pay | Admitting: Cardiology

## 2022-06-24 NOTE — Telephone Encounter (Signed)
Prescription refill request for Xarelto received.  Indication: aflutter Last office visit: [place], 05/01/2022 Weight: 97.1 kg  Age: 82 yo  Scr: 1.30, 01/14/2022 CrCl: 62 ml/min   Refill sent.

## 2022-08-03 ENCOUNTER — Other Ambulatory Visit: Payer: Self-pay | Admitting: Cardiology

## 2022-08-05 NOTE — Telephone Encounter (Signed)
Rx refill sent to pharmacy. 

## 2022-08-12 NOTE — Progress Notes (Unsigned)
Cardiology Office Note:    Date:  08/13/2022   ID:  Andrew Posey., DOB [birth year], MRN [id number]  PCP:  Lonie Peak, PA-C  Cardiologist:  Norman Herrlich, MD    Referring MD: Lonie Peak, PA-C    ASSESSMENT:    1. Persistent atrial fibrillation   2. Chronic anticoagulation   3. Coronary artery disease involving native coronary artery of native heart with angina pectoris   4. Hypertensive heart disease with heart failure    PLAN:    In order of problems listed above:  Overall Andrew Hardy is better his atrial fibrillation is rate controlled he will continue his combined calcium channel blocker beta-blocker and anticoagulation. Stable CAD after multiple PCI's having no anginal discomfort he will continue his combined antiplatelet clopidogrel blocker oral nitrate anticoagulant therapy along with lipid-lowering high intensity statin and Zetia LDL at target Doing well with heart failure continue his current loop diuretic   Next appointment: 4 months   Medication Adjustments/Labs and Tests Ordered: Current medicines are reviewed at length with the patient today.  Concerns regarding medicines are outlined above.  Orders Placed This Encounter  Procedures   Comp Met (CMET)   Lipid Profile   CBC   No orders of the defined types were placed in this encounter.   Chief complaint follow-up for multiple cardiac problems including atrial fibrillation CAD and hypertensive heart disease with heart failure   History of Present Illness:    Andrew Poer. is a 82 y.o. male with a hx of persistent atrial fibrillation on chronic anticoagulation hypertensive heart disease with heart failure coronary artery disease and hyperlipidemia last PCI and stent distal left circumflex coronary artery April 2023 most recent ejection fraction 55 to 60% in January 2023 last seen 05/01/2022.  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 not  have the strength he had in the past but he still is not having any significant exercise intolerance edema shortness of breath chest pain palpitation or syncope Has had no bleeding with his anticoagulant No muscle pain or weakness with combined lipid-lowering therapy with rosuvastatin and Zetia Past Medical History:  Diagnosis Date   Anxiety    Arthritis    Atypical atrial flutter 12/14/2014   Bradycardia, sinus 10/15/2014   Burning sensation of feet 01/17/2016   CAD, multiple vessel 10/18/2014   Overview:  DES to [facility] for unstable angina 10/17/14 Diagnostic Summary Severe stenosis of mid Cx likely cause of recent accelerating angina Diffuse severe stenosis of small caliber LAD unchanged from 2012 Normal LV function Paroxysmal SVT noted during procedure 04/23/16:  PCI / Resolute Drug Eluting Stent of the proximal Left Anterior Descending Coronary Artery. Successful PTCA of the ostial 1st Diagonal Coronary Artery   Chronic left-sided low back pain with sciatica 02/21/2016   Coronary artery disease 2003   s/p MI and numerous stents   Coronary artery disease involving native coronary artery of native heart with angina pectoris 10/18/2014   Overview:  DES to [facility] for unstable angina 10/17/14 Diagnostic Summary Severe stenosis of mid Cx likely cause of recent accelerating angina Diffuse severe stenosis of small caliber LAD unchanged from 2012 Normal LV function Paroxysmal SVT noted during procedure 04/23/16:  PCI / Resolute Drug Eluting Stent of the proximal Left Anterior Descending Coronary Artery. 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   Degenerative spondylolisthesis 05/02/2017   Depression  Dyspnea    WITH EXERTION   Enlarged prostate without lower urinary tract symptoms (luts) 2012   frequent urination and nocturia; sees dr Oda Cogan in [place]   Essential hypertension 10/15/2014   Gastroesophageal reflux disease without esophagitis  04/21/2016   GERD (gastroesophageal reflux disease)    Hyperlipidemia    Hypertension    Hypertensive heart disease with heart failure 12/20/2016   IHD (ischemic heart disease) 10/15/2014   Lower limb pain, inferior, right 12/17/2017   Lumbar radiculopathy 02/21/2016   Myocardial infarction 2003   NSTEMI (non-ST elevated myocardial infarction) 04/24/2016   Overview:  LHC 04/24/15: Severe stenosis of the LAD & Diagonal bifurcation  LV ejection fraction is 55-60 %  Interventional Summary Successful Angiosculpt PCI / Resolute Drug Eluting Stent of the proximal LAD Successful PTCA of the ostial 1st Diagonal Coronary Artery   Paroxysmal SVT (supraventricular tachycardia) 11/16/2014   Precordial pain 10/15/2014   PVC's (premature ventricular contractions) 04/22/2016   RBBB 10/15/2014   AFIB   S/P angioplasty with stent 08/10/21 with DES to mid OM1 and distal LCX-OM2 08/11/2021   S/P lumbar fusion 04/24/2017   Sacroiliac joint pain 09/10/2018   Sleep disturbances 01/17/2016   Snoring 01/17/2016   Somatic dysfunction of left sacroiliac joint 06/19/2018   Subscapularis (muscle) sprain 03/29/2011   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    Past Surgical History:  Procedure Laterality Date   BIOPSY  04/24/2020   Procedure: BIOPSY;  Surgeon: Beverley Fiedler, MD;  Location: [facility] ENDOSCOPY;  Service: Gastroenterology;;   CARDIAC CATHETERIZATION  10-2010   ptca/stent in [place]   CARDIAC CATHETERIZATION  04/23/2016   COLONOSCOPY WITH PROPOFOL N/A 04/24/2020   Procedure: COLONOSCOPY WITH PROPOFOL;  Surgeon: Beverley Fiedler, MD;  Location: [facility] ENDOSCOPY;  Service: Gastroenterology;  Laterality: N/A;   CORONARY ANGIOPLASTY     CORONARY STENT INTERVENTION N/A 08/10/2021   Procedure: CORONARY STENT INTERVENTION;  Surgeon: Marykay Lex, MD;  Location: [facility] INVASIVE CV LAB;  Service: Cardiovascular;  Laterality: N/A;   ESOPHAGOGASTRODUODENOSCOPY (EGD) WITH PROPOFOL N/A 04/24/2020   Procedure:  ESOPHAGOGASTRODUODENOSCOPY (EGD) WITH PROPOFOL;  Surgeon: Beverley Fiedler, MD;  Location: [facility] ENDOSCOPY;  Service: Gastroenterology;  Laterality: N/A;   EYE SURGERY  05-1010   bil cataract ,iol   FRACTURE SURGERY     HEMOSTASIS CONTROL  04/24/2020   Procedure: HEMOSTASIS CONTROL;  Surgeon: Beverley Fiedler, MD;  Location: [facility] ENDOSCOPY;  Service: Gastroenterology;;   I & D EXTREMITY Right 02/23/2015   Procedure: RIGHT RING FINGER REVISION AMPUTATION;  Surgeon: Betha Loa, MD;  Location: [facility] OR;  Service: Orthopedics;  Laterality: Right;   KNEE ARTHROSCOPY W/ MENISCAL REPAIR  2000   left knee   LEFT HEART CATH AND CORONARY ANGIOGRAPHY N/A 08/10/2021   Procedure: LEFT HEART CATH AND CORONARY ANGIOGRAPHY;  Surgeon: Marykay Lex, MD;  Location: [facility] INVASIVE CV LAB;  Service: Cardiovascular;  Laterality: N/A;   LUMBAR EPIDURAL INJECTION  2011   low back pain; DDD   POLYPECTOMY  04/24/2020   Procedure: POLYPECTOMY;  Surgeon: Beverley Fiedler, MD;  Location: [facility] ENDOSCOPY;  Service: Gastroenterology;;   REVERSE SHOULDER ARTHROPLASTY Left 11/23/2021   Procedure: REVERSE SHOULDER ARTHROPLASTY;  Surgeon: Beverely Low, MD;  Location: [facility] ORS;  Service: Orthopedics;  Laterality: Left;   ring finger tramatic qamputation Right    SHOULDER ARTHROSCOPY  03/29/2011   Procedure: ARTHROSCOPY SHOULDER;  Surgeon: Verlee Rossetti;  Location: [facility] OR;  Service: Orthopedics;  Laterality: Right;  RIGHT  SHOULDER ARTHROSCOPY WITH OPEN SUBSCAPULAR REPAIR   SHOULDER OPEN ROTATOR CUFF REPAIR     3 on rt shoulder; 2 on left   TRANSFORAMINAL LUMBAR INTERBODY FUSION (TLIF) WITH PEDICLE SCREW FIXATION 1 LEVEL N/A 04/24/2017   Procedure: TRANSFORAMINAL LUMBAR INTERBODY FUSION (TLIF) L4-5;  Surgeon: Venita Lick, MD;  Location: [facility] OR;  Service: Orthopedics;  Laterality: N/A;  4 hrs    Current Medications: Current Meds  Medication Sig   acetaminophen (TYLENOL) 325 MG tablet Take 2 tablets (650 mg total) by mouth every 6 (six) hours as needed  (pain).   Ascorbic Acid (VITAMIN C) 1000 MG tablet Take 1,000 mg by mouth daily.   clopidogrel (PLAVIX) 75 MG tablet Take 1 tablet (75 mg total) by mouth daily with breakfast.   CO-ENZYME Q-10 PO Take 50 mg by mouth daily.   Cyanocobalamin (B-12 PO) Take 1 tablet by mouth daily.   diltiazem (CARDIZEM CD) 120 MG 24 hr capsule Take 120 mg by mouth daily.   DULoxetine (CYMBALTA) 30 MG capsule Take 30 mg by mouth 2 (two) times daily.   ezetimibe (ZETIA) 10 MG tablet TAKE 1 TABLET(10 MG) BY MOUTH EVERY EVENING (Patient taking differently: Take 10 mg by mouth daily.)   finasteride (PROSCAR) 5 MG tablet Take 5 mg by mouth daily.   Flaxseed, Linseed, (FLAXSEED OIL MAX STR) 1300 MG CAPS Take 1,300 mg by mouth daily.   furosemide (LASIX) 80 MG tablet Take 1 tablet (80 mg total) by mouth in the morning.   gemfibrozil (LOPID) 600 MG tablet Take 0.5 tablets (300 mg total) by mouth 2 (two) times daily before a meal.   Glucosamine HCl (GLUCOSAMINE PO) Take 1 tablet by mouth 2 (two) times daily.   isosorbide mononitrate (IMDUR) 60 MG 24 hr tablet Take 1 tablet (60 mg total) by mouth daily.   methocarbamol (ROBAXIN) 500 MG tablet Take 1 tablet (500 mg total) by mouth every 6 (six) hours as needed for muscle spasms.   metoprolol succinate (TOPROL-XL) 25 MG 24 hr tablet Take 1 tablet (25 mg total) by mouth 3 (three) times daily.   minocycline (MINOCIN,DYNACIN) 50 MG capsule Take 50 mg by mouth daily.   Multiple Vitamins-Minerals (MULTIVITAMINS THER. W/MINERALS) TABS Take 1 tablet by mouth daily.   nitroGLYCERIN (NITROSTAT) 0.4 MG SL tablet Place 1 tablet (0.4 mg total) under the tongue every 5 (five) minutes as needed for chest pain.   Omega-3 Fatty Acids (FISH OIL PO) Take 1 tablet by mouth daily.   pantoprazole (PROTONIX) 40 MG tablet Take 1 tablet (40 mg total) by mouth daily.   potassium chloride SA (KLOR-CON M) 20 MEQ tablet Take 2 tablets (40 mEq total) by mouth daily. Take 20 mg at night   PUMPKIN SEED PO  Take 1 tablet by mouth daily.   rosuvastatin (CRESTOR) 10 MG tablet Take 10 mg by mouth daily.   tamsulosin (FLOMAX) 0.4 MG CAPS capsule Take 0.4 mg by mouth daily.   traMADol (ULTRAM) 50 MG tablet Take 2 tablets (100 mg total) by mouth every 6 (six) hours as needed for severe pain or moderate pain.   traZODone (DESYREL) 50 MG tablet Take 50 mg by mouth at bedtime.   TURMERIC CURCUMIN PO Take 1 capsule by mouth daily.   XARELTO 20 MG TABS tablet TAKE 1 TABLET(20 MG) BY MOUTH DAILY WITH SUPPER     Allergies:   Amiodarone, Sotalol hcl, Buprenorphine hcl, Codeine, Hydrocodone, and Percocet [oxycodone-acetaminophen]   Social History   Socioeconomic History  Marital status: Married    Spouse name: Not on file   Number of children: Not on file   Years of education: Not on file   Highest education level: Not on file  Occupational History   Not on file  Tobacco Use   Smoking status: Former    Packs/day: 1.00    Years: 20.00    Additional pack years: 0.00    Total pack years: 20.00    Types: Cigarettes    Quit date: 03/20/1994    Years since quitting: 28.4   Smokeless tobacco: Former    Types: Chew    Quit date: 03/20/1996  Vaping Use   Vaping Use: Never used  Substance and Sexual Activity   Alcohol use: Yes    Alcohol/week: 6.0 standard drinks of alcohol    Types: 6 Cans of beer per week    Comment: BEER- DAILY   Drug use: No   Sexual activity: Not on file  Other Topics Concern   Not on file  Social History Narrative   Not on file   Social Determinants of Health   Financial Resource Strain: Not on file  Food Insecurity: Not on file  Transportation Needs: Not on file  Physical Activity: Not on file  Stress: Not on file  Social Connections: Not on file     Family History: The patient's family history includes Cancer in his father and sister; Heart disease in his father and mother. ROS:   Please see the history of present illness.    All other systems reviewed and  are negative.  EKGs/Labs/Other Studies Reviewed:    The following studies were reviewed today:  Cardiac Studies & Procedures   CARDIAC CATHETERIZATION  CARDIAC CATHETERIZATION 08/10/2021  Narrative   Prox RCA to Mid RCA lesion is 30% stenosed.   Previously placed Prox LAD to Mid LAD stent (unknown type) is  widely patent.  Jailed 1st Diag lesion is 60% stenosed.   Previously placed Prox Cx to Dist Cx stent (unknown type) is  widely patent.   Lesion #1: Dist Cx lesion is 90% stenosed.   A drug-eluting stent was successfully placed using a STENT ONYX FRONTIER 2.25X12.  Deployed at 2.35 mm and postdilated to 2.45 mm in the overlap segment.   Post intervention, there is a 0% residual stenosis.   Lesion #2: 1st Mrg lesion is 80% stenosed with 95% stenosed side branch in Lat 1st Mrg.   Mid LAD lesion is 55% stenosed.   Dist LAD lesion is 50% stenosed.   A drug-eluting stent was successfully placed using a STENT ONYX FRONTIER 2.25X1 -> deployed to 2.45 mm.   Post intervention, there is a 0% residual stenosis.  Post intervention, the side branch was reduced to 100% residual stenosis.  (Patient did have chest pain with stent placement, resolved with sublingual nitroglycerin and fentanyl/Versed.)  Multivessel CAD with 2 culprit lesions: Mid OM1 (85%) at small branch and distal LCx-OM2 (90%) just distal to prior stent.  Mild ISR in RCA stent and ostial diagonal (jailed) 60%.  Tandem 50 to 60% lesions in the mid LAD that are in hinge points.  Not likely flow-limiting. Successful two-vessel PCI of 2 culprit lesions reducing to 0% stenosis each treated with Onyx frontier DES 2.25 mm 12 mm stents dilated to 2.45 mm. Preserved LVEF with low normal roughly 50 to 55%.  Inadequate filling-therefore unable to truly estimate EF and wall motion.  Findings Coronary Findings Diagnostic  Dominance: Right  Left Anterior Descending Vessel  is moderate in size. Previously placed Prox LAD to Mid LAD stent  (unknown type) is  widely patent. Mid LAD lesion is 55% stenosed. The lesion is located at the bend. Dist LAD lesion is 50% stenosed. The lesion is located at the bend.  First Diagonal Branch Vessel is small in size. 1st Diag lesion is 60% stenosed. The lesion was previously treated using angioplasty . Rescue PTCA after stenting across  Second Diagonal Branch Vessel is small in size.  Third Diagonal Branch Vessel is small in size.  Left Circumflex Vessel is small. Previously placed Prox Cx to Dist Cx stent (unknown type) is  widely patent. Dist Cx lesion is 90% stenosed. The lesion is distal to major branch, focal, concentric and irregular. Distal to the previous stent  First Obtuse Marginal Branch Vessel is small in size. 1st Mrg lesion is 80% stenosed with 95% stenosed side branch in Lat 1st Mrg. The lesion is eccentric and ulcerative. At very small sidebranch The lesion was not previously treated .  Left Posterior Atrioventricular Artery Vessel is small in size.  Right Coronary Artery Prox RCA to Mid RCA lesion is 30% stenosed. The lesion was previously treated .  Intervention  Prox Cx to Dist Cx lesion Stent (Also treats lesions: Dist Cx) Lesion length:  8 mm. CATH VISTA GUIDE 6FR XB3.5 guide catheter was inserted. Lesion crossed with guidewire using a WIRE ASAHI PROWATER 180CM. Pre-stent angioplasty was performed using a BALLN SAPPHIRE 2.25X10. Maximum pressure:  10 atm. Inflation time: 20 sec. A drug-eluting stent was successfully placed using a STENT ONYX FRONTIER 2.25X12. Maximum pressure: 14 atm. Inflation time: 30 sec. Stent strut is well apposed. Stent balloon pulled back to the overlapping segment and inflated to high ATM Deployed to 2.35 mm, but that the overlap site dilated to 2.45 mm. Post-stent angioplasty was not performed. Stent balloon at high ATM for overlap-18 ATM Downstream lesion covered with a overlapping stent, overlapping segment postdilated with stent  balloon to high ATM. Post-Intervention Lesion Assessment The intervention was successful. Pre-interventional TIMI flow is 3. Post-intervention TIMI flow is 3. There is a 0% residual stenosis post intervention.  Dist Cx lesion Stent (Also treats lesions: Prox Cx to Dist Cx) See details in Prox Cx to Dist Cx lesion. Post-Intervention Lesion Assessment The intervention was successful. Pre-interventional TIMI flow is 3. Post-intervention TIMI flow is 3. Treated lesion length:  10 mm. No complications occurred at this lesion. There is a 0% residual stenosis post intervention.  1st Mrg lesion with side branch in Lat 1st Mrg Stent - Main Branch Lesion length:  10 mm. CATH VISTA GUIDE 6FR XB3.5 guide catheter was inserted. Lesion crossed with guidewire using a WIRE ASAHI PROWATER 180CM. Pre-stent angioplasty was performed using a BALLN SAPPHIRE 2.25X10. Maximum pressure:  10 atm. Inflation time:  20 sec. A drug-eluting stent was successfully placed using a STENT ONYX FRONTIER 2.25X12. Maximum pressure: 16 atm. Inflation time: 30 sec. Stent strut is well apposed. Deployed to 2.45 mm Post-stent angioplasty was not performed. Same [place]ater wire was read directed after PCI of the distal LCx into the OM1 Post-Intervention Lesion Assessment The intervention was successful. Pre-interventional TIMI flow is 3. Post-intervention TIMI flow is 3. Treated lesion length:  12 mm. Very small sidebranch occluded =&gt; 4 out of 10 chest pain, resolved with sublingual nitroglycerin and second round of fentanyl and Versed. There is a 0% residual stenosis in the main branch post intervention. There is a 100% residual stenosis in the side branch  post intervention.   STRESS TESTS  MYOCARDIAL PERFUSION IMAGING 03/25/2019  Narrative  There was no ST segment deviation noted during stress.  No T wave inversion was noted during stress.  Nongated study due to atrial fibrillation.  The study is normal.  This is a low  risk study.   ECHOCARDIOGRAM  ECHOCARDIOGRAM COMPLETE 01/29/2022  Narrative ECHOCARDIOGRAM REPORT    Patient Name:   Andrew Hardy. Date of Exam: 01/29/2022 Medical Rec #:  [id number]       Height:       66.0 in Accession #:    [id number]      Weight:       204.8 lb Date of Birth:  April 04, 1941       BSA:          2.020 m Patient Age:    82 years        BP:           94/60 mmHg Patient Gender: M               HR:           92 bpm. Exam Location:  [place]  Procedure: 2D Echo, 3D Echo, Cardiac Doppler, Color Doppler and Strain Analysis  Indications:    Coronary artery disease involving native coronary artery of native heart with angina pectoris (HCC) [I25.119 (ICD-10-CM)]; Persistent atrial fibrillation (HCC) [I48.19 (ICD-10-CM)]; Chronic anticoagulation [Z79.01 (ICD-10-CM)]; High risk medication use [Z79.899 (ICD-10-CM)]; Hypertensive heart disease with heart failure (HCC) [I11.0 (ICD-10-CM)]; Mixed hyperlipidemia [E78.2 (ICD-10-CM)]; SOB (shortness of breath) on exertion [R06.02 (ICD-10-CM)]  History:        Patient has prior history of Echocardiogram examinations, most recent 05/04/2019. CAD and Previous Myocardial Infarction, Arrythmias:Atrial Fibrillation, SVT and PVC; Risk Factors:Dyslipidemia and Hypertension.  Sonographer:    Margreta Journey RDCS Referring Phys: [id number] Jaceon Heiberger J Ausha Sieh  IMPRESSIONS   1. Left ventricular ejection fraction, by estimation, is 50 to 55%. Left ventricular ejection fraction by 3D volume is 53 %. The left ventricle has low normal function. The left ventricle has no regional wall motion abnormalities. Left ventricular diastolic parameters are indeterminate. The average left ventricular global longitudinal strain is -12.8 %. The global longitudinal strain is abnormal. 2. Right ventricular systolic function is normal. The right ventricular size is normal. There is normal pulmonary artery systolic pressure. 3. Left atrial size was mildly  dilated. 4. The mitral valve is degenerative. Mild mitral valve regurgitation. No evidence of mitral stenosis. 5. The aortic valve is tricuspid. Aortic valve regurgitation is mild. Aortic valve sclerosis is present, with no evidence of aortic valve stenosis. 6. Aortic normal DTA. 7. The inferior vena cava is dilated in size with >50% respiratory variability, suggesting right atrial pressure of 8 mmHg.  FINDINGS Left Ventricle: Left ventricular ejection fraction, by estimation, is 50 to 55%. Left ventricular ejection fraction by 3D volume is 53 %. The left ventricle has low normal function. The left ventricle has no regional wall motion abnormalities. The average left ventricular global longitudinal strain is -12.8 %. The global longitudinal strain is abnormal. The left ventricular internal cavity size was normal in size. There is no left ventricular hypertrophy. Left ventricular diastolic parameters are indeterminate.  Right Ventricle: The right ventricular size is normal. No increase in right ventricular wall thickness. Right ventricular systolic function is normal. There is normal pulmonary artery systolic pressure. The tricuspid regurgitant velocity is 2.47 m/s, and with an assumed right atrial pressure of 8 mmHg, the estimated right  ventricular systolic pressure is 32.4 mmHg.  Left Atrium: Left atrial size was mildly dilated.  Right Atrium: Right atrial size was normal in size.  Pericardium: There is no evidence of pericardial effusion.  Mitral Valve: The mitral valve is degenerative in appearance. Mild mitral annular calcification. Mild mitral valve regurgitation. No evidence of mitral valve stenosis.  Tricuspid Valve: The tricuspid valve is normal in structure. Tricuspid valve regurgitation is mild . No evidence of tricuspid stenosis.  Aortic Valve: The aortic valve is tricuspid. Aortic valve regurgitation is mild. Aortic regurgitation PHT measures 724 msec. Aortic valve sclerosis is  present, with no evidence of aortic valve stenosis.  Pulmonic Valve: The pulmonic valve was normal in structure. Pulmonic valve regurgitation is not visualized. No evidence of pulmonic stenosis.  Aorta: The aortic root and ascending aorta are structurally normal, with no evidence of dilitation, normal DTA and the aortic arch was not well visualized.  Venous: A systolic blunting flow pattern is recorded from the right upper pulmonary vein. The inferior vena cava is dilated in size with greater than 50% respiratory variability, suggesting right atrial pressure of 8 mmHg.  IAS/Shunts: No atrial level shunt detected by color flow Doppler.   LEFT VENTRICLE PLAX 2D LVIDd:         4.20 cm         Diastology LVIDs:         3.00 cm         LV e' medial:    8.38 cm/s LV PW:         1.10 cm         LV E/e' medial:  11.9 LV IVS:        1.20 cm         LV e' lateral:   12.60 cm/s LVOT diam:     2.00 cm         LV E/e' lateral: 7.9 LV SV:         48 LV SV Index:   24              2D LVOT Area:     3.14 cm        Longitudinal Strain 2D Strain GLS  -12.8 % LV Volumes (MOD)               Avg: LV vol d, MOD    74.4 ml A2C:                           3D Volume EF LV vol d, MOD    109.0 ml      LV 3D EF:    Left A4C:                                        ventricul LV vol s, MOD    33.3 ml                    ar A2C:                                        ejection LV vol s, MOD    50.0 ml  fraction A4C:                                        by 3D LV SV MOD A2C:   41.1 ml                    volume is LV SV MOD A4C:   109.0 ml                   53 %. LV SV MOD BP:    52.7 ml  3D Volume EF: 3D EF:        53 % LV EDV:       118 ml LV ESV:       56 ml LV SV:        62 ml  RIGHT VENTRICLE             IVC RV Basal diam:  3.00 cm     IVC diam: 2.50 cm RV S prime:     13.80 cm/s TAPSE (M-mode): 1.6 cm  LEFT ATRIUM             Index        RIGHT ATRIUM           Index LA diam:         4.10 cm 2.03 cm/m   RA Area:     17.30 cm LA Vol (A2C):   75.8 ml 37.52 ml/m  RA Volume:   37.00 ml  18.32 ml/m LA Vol (A4C):   63.7 ml 31.53 ml/m LA Biplane Vol: 70.6 ml 34.95 ml/m AORTIC VALVE LVOT Vmax:   87.70 cm/s LVOT Vmean:  62.500 cm/s LVOT VTI:    0.152 m AI PHT:      724 msec  AORTA Ao Root diam: 3.10 cm Ao Asc diam:  3.40 cm Ao Desc diam: 2.30 cm  MITRAL VALVE               TRICUSPID VALVE MV Area (PHT): 3.53 cm    TR Peak grad:   24.4 mmHg MV Decel Time: 215 msec    TR Vmax:        247.00 cm/s MR Peak grad: 50.7 mmHg MR Vmax:      356.00 cm/s  SHUNTS MV E velocity: 99.80 cm/s  Systemic VTI:  0.15 m Systemic Diam: 2.00 cm  Norman Herrlich MD Electronically signed by Norman Herrlich MD Signature Date/Time: 01/29/2022/12:40:53 PM    Final    MONITORS  LONG TERM MONITOR (3-14 DAYS) 02/01/2022  Narrative Patch Wear Time:  7 days and 10 hours (2023-09-25T08:59:54-0400 to 2023-10-02T19:21:24-0400)  Atrial Fibrillation occurred continuously (100% burden), ranging from 57-177 bpm (avg of 96 bpm). Bundle Branch Block/IVCD was present.  There were no pauses of 3 seconds or greater  Isolated VEs were rare (<1.0%), VE Couplets were rare (<1.0%), and no VE Triplets were present. Ventricular Bigeminy was present.  Heart rate was greater than 110 bpm 26% of the time daytime 2% of the time nighttime.  Her 48 triggered events all associated with atrial fibrillation generally rapid rates greater than 110 bpm and 1 episode with ventricular bigeminy.             Recent Labs: 01/14/2022: ALT 9; BUN 25; Creatinine, Ser 1.30; Hemoglobin 14.2; NT-Pro BNP 306; Platelets 280; Potassium 4.0; Sodium 140  03/26/2022 cholesterol 134 LDL 61 A1c 6.6%  hemoglobin 12.8 creatinine 1.17 potassium 5.2   Physical Exam:    VS:  BP 128/88 (BP Location: Right Arm, Patient Position: Sitting)   Pulse 92   Ht 5\' 6"  (1.676 m)   Wt 215 lb (97.5 kg)   SpO2 98%   BMI 34.70 kg/m     Wt  Readings from Last 3 Encounters:  08/13/22 215 lb (97.5 kg)  05/01/22 214 lb (97.1 kg)  04/08/22 202 lb (91.6 kg)     GEN:  Well nourished, well developed in no acute distress HEENT: Normal NECK: No JVD; No carotid bruits LYMPHATICS: No lymphadenopathy CARDIAC: Irregular rate and rhythm variable.  RRR, no murmurs, rubs, gallops RESPIRATORY:  Clear to auscultation without rales, wheezing or rhonchi  ABDOMEN: Soft, non-tender, non-distended MUSCULOSKELETAL:  No edema; No deformity  SKIN: Warm and dry NEUROLOGIC:  Alert and oriented x 3 PSYCHIATRIC:  Normal affect    Signed, Norman Herrlich, MD  08/13/2022 9:45 AM    [facility]

## 2022-08-13 ENCOUNTER — Encounter: Payer: Self-pay | Admitting: Cardiology

## 2022-08-13 ENCOUNTER — Ambulatory Visit: Payer: PPO | Attending: Cardiology | Admitting: Cardiology

## 2022-08-13 VITALS — BP 128/88 | HR 92 | Ht 66.0 in | Wt 215.0 lb

## 2022-08-13 DIAGNOSIS — I110 Hypertensive heart disease with heart failure: Secondary | ICD-10-CM | POA: Diagnosis not present

## 2022-08-13 DIAGNOSIS — Z7901 Long term (current) use of anticoagulants: Secondary | ICD-10-CM

## 2022-08-13 DIAGNOSIS — I25119 Atherosclerotic heart disease of native coronary artery with unspecified angina pectoris: Secondary | ICD-10-CM | POA: Diagnosis not present

## 2022-08-13 DIAGNOSIS — I4819 Other persistent atrial fibrillation: Secondary | ICD-10-CM

## 2022-08-13 NOTE — Patient Instructions (Signed)
Medication Instructions:  Your physician recommends that you continue on your current medications as directed. Please refer to the Current Medication list given to you today.  *If you need a refill on your cardiac medications before your next appointment, please call your pharmacy*   Lab Work: Your physician recommends that you return for lab work in:   Labs today: CMP, Lipids, CBC  If you have labs (blood work) drawn today and your tests are completely normal, you will receive your results only by: MyChart Message (if you have MyChart) OR A paper copy in the mail If you have any lab test that is abnormal or we need to change your treatment, we will call you to review the results.   Testing/Procedures: None   Follow-Up: At [facility], you and your health needs are our priority.  As part of our continuing mission to provide you with exceptional heart care, we have created designated Provider Care Teams.  These Care Teams include your primary Cardiologist (physician) and Advanced Practice Providers (APPs -  Physician Assistants and Nurse Practitioners) who all work together to provide you with the care you need, when you need it.  We recommend signing up for the patient portal called "MyChart".  Sign up information is provided on this After Visit Summary.  MyChart is used to connect with patients for Virtual Visits (Telemedicine).  Patients are able to view lab/test results, encounter notes, upcoming appointments, etc.  Non-urgent messages can be sent to your provider as well.   To learn more about what you can do with MyChart, go to [email or web address].    Your next appointment:   4 month(s)  Provider:   Norman Herrlich, MD    Other Instructions None

## 2022-08-14 LAB — COMPREHENSIVE METABOLIC PANEL
ALT: 10 IU/L (ref 0–44)
AST: 12 IU/L (ref 0–40)
Albumin/Globulin Ratio: 1.6 (ref 1.2–2.2)
Albumin: 4.2 g/dL (ref 3.8–4.8)
Alkaline Phosphatase: 65 IU/L (ref 44–121)
BUN/Creatinine Ratio: 13 (ref 10–24)
BUN: 19 mg/dL (ref 8–27)
Bilirubin Total: 0.4 mg/dL (ref 0.0–1.2)
CO2: 21 mmol/L (ref 20–29)
Calcium: 9 mg/dL (ref 8.6–10.2)
Chloride: 104 mmol/L (ref 96–106)
Creatinine, Ser: 1.42 mg/dL — ABNORMAL HIGH (ref 0.76–1.27)
Globulin, Total: 2.6 g/dL (ref 1.5–4.5)
Glucose: 125 mg/dL — ABNORMAL HIGH (ref 70–99)
Potassium: 4.7 mmol/L (ref 3.5–5.2)
Sodium: 143 mmol/L (ref 134–144)
Total Protein: 6.8 g/dL (ref 6.0–8.5)
eGFR: 50 mL/min/{1.73_m2} — ABNORMAL LOW (ref >59)

## 2022-08-14 LAB — CBC
Hematocrit: 41.5 % (ref 37.5–51.0)
Hemoglobin: 13.8 g/dL (ref 13.0–17.7)
MCH: 30.8 pg (ref 26.6–33.0)
MCHC: 33.3 g/dL (ref 31.5–35.7)
MCV: 93 fL (ref 79–97)
Platelets: 223 10*3/uL (ref 150–450)
RBC: 4.48 x10E6/uL (ref 4.14–5.80)
RDW: 14.2 % (ref 11.6–15.4)
WBC: 5 10*3/uL (ref 3.4–10.8)

## 2022-08-14 LAB — LIPID PANEL
Chol/HDL Ratio: 2.5 ratio (ref 0.0–5.0)
Cholesterol, Total: 136 mg/dL (ref 100–199)
HDL: 54 mg/dL (ref >39)
LDL Chol Calc (NIH): 57 mg/dL (ref 0–99)
Triglycerides: 146 mg/dL (ref 0–149)
VLDL Cholesterol Cal: 25 mg/dL (ref 5–40)

## 2022-08-25 ENCOUNTER — Other Ambulatory Visit: Payer: Self-pay | Admitting: Cardiology

## 2022-08-26 NOTE — Telephone Encounter (Signed)
Refills to pharmacy 

## 2022-09-04 DIAGNOSIS — R0981 Nasal congestion: Secondary | ICD-10-CM | POA: Diagnosis not present

## 2022-09-04 DIAGNOSIS — R059 Cough, unspecified: Secondary | ICD-10-CM | POA: Diagnosis not present

## 2022-09-04 DIAGNOSIS — J069 Acute upper respiratory infection, unspecified: Secondary | ICD-10-CM | POA: Diagnosis not present

## 2022-10-17 DIAGNOSIS — N401 Enlarged prostate with lower urinary tract symptoms: Secondary | ICD-10-CM | POA: Diagnosis not present

## 2022-10-17 DIAGNOSIS — R300 Dysuria: Secondary | ICD-10-CM | POA: Diagnosis not present

## 2022-10-17 DIAGNOSIS — N419 Inflammatory disease of prostate, unspecified: Secondary | ICD-10-CM | POA: Diagnosis not present

## 2022-10-25 ENCOUNTER — Other Ambulatory Visit: Payer: Self-pay | Admitting: Cardiology

## 2022-10-29 DIAGNOSIS — E119 Type 2 diabetes mellitus without complications: Secondary | ICD-10-CM | POA: Diagnosis not present

## 2022-10-29 DIAGNOSIS — I10 Essential (primary) hypertension: Secondary | ICD-10-CM | POA: Diagnosis not present

## 2022-10-29 DIAGNOSIS — Z23 Encounter for immunization: Secondary | ICD-10-CM | POA: Diagnosis not present

## 2022-10-29 DIAGNOSIS — S41111A Laceration without foreign body of right upper arm, initial encounter: Secondary | ICD-10-CM | POA: Diagnosis not present

## 2022-10-29 DIAGNOSIS — W268XXA Contact with other sharp object(s), not elsewhere classified, initial encounter: Secondary | ICD-10-CM | POA: Diagnosis not present

## 2022-11-04 ENCOUNTER — Telehealth: Payer: Self-pay

## 2022-11-04 NOTE — Telephone Encounter (Signed)
Transition Care Management Follow-up Telephone Call Date of discharge and from where: 10/29/2022 [facility] How have you been since you were released from the hospital? Patient stated he is feeling better and his arm is no longer bleeding. Any questions or concerns? No  Items Reviewed: Did the pt receive and understand the discharge instructions provided? Yes  Medications obtained and verified? Yes  Other? No  Any new allergies since your discharge? No  Dietary orders reviewed? Yes Do you have support at home? Yes   Follow up appointments reviewed:  PCP Hospital f/u appt confirmed? No  Scheduled to see  on  @ . Specialist Hospital f/u appt confirmed? No  Scheduled to see  on  @ . Are transportation arrangements needed? No  If their condition worsens, is the pt aware to call PCP or go to the Emergency Dept.? Yes Was the patient provided with contact information for the PCP's office or ED? Yes Was to pt encouraged to call back with questions or concerns? Yes  Quayshawn Nin [facility]  [facility] Population Health Community Resource Care Guide   [email or web address]  ?? [phone]   Website: [email or web address]  [email or web address]

## 2022-11-05 DIAGNOSIS — M549 Dorsalgia, unspecified: Secondary | ICD-10-CM | POA: Diagnosis not present

## 2022-11-05 DIAGNOSIS — G4700 Insomnia, unspecified: Secondary | ICD-10-CM | POA: Diagnosis not present

## 2022-11-05 DIAGNOSIS — Z79899 Other long term (current) drug therapy: Secondary | ICD-10-CM | POA: Diagnosis not present

## 2022-11-05 DIAGNOSIS — K219 Gastro-esophageal reflux disease without esophagitis: Secondary | ICD-10-CM | POA: Diagnosis not present

## 2022-11-05 DIAGNOSIS — E1169 Type 2 diabetes mellitus with other specified complication: Secondary | ICD-10-CM | POA: Diagnosis not present

## 2022-11-05 DIAGNOSIS — N400 Enlarged prostate without lower urinary tract symptoms: Secondary | ICD-10-CM | POA: Diagnosis not present

## 2022-11-05 DIAGNOSIS — I25118 Atherosclerotic heart disease of native coronary artery with other forms of angina pectoris: Secondary | ICD-10-CM | POA: Diagnosis not present

## 2022-11-05 DIAGNOSIS — R0609 Other forms of dyspnea: Secondary | ICD-10-CM | POA: Diagnosis not present

## 2022-11-05 DIAGNOSIS — I4891 Unspecified atrial fibrillation: Secondary | ICD-10-CM | POA: Diagnosis not present

## 2022-11-05 DIAGNOSIS — G8929 Other chronic pain: Secondary | ICD-10-CM | POA: Diagnosis not present

## 2022-11-05 DIAGNOSIS — E7800 Pure hypercholesterolemia, unspecified: Secondary | ICD-10-CM | POA: Diagnosis not present

## 2022-11-05 DIAGNOSIS — S41111A Laceration without foreign body of right upper arm, initial encounter: Secondary | ICD-10-CM | POA: Diagnosis not present

## 2022-11-18 DIAGNOSIS — E875 Hyperkalemia: Secondary | ICD-10-CM | POA: Diagnosis not present

## 2022-12-12 NOTE — Progress Notes (Signed)
Cardiology Office Note:    Date:  12/13/2022   ID:  Andrew Villacres., DOB 12-Sep-1940, MRN [id number]  PCP:  Lonie Peak, PA-C  Cardiologist:  Norman Herrlich, MD    Referring MD: Lonie Peak, PA-C    ASSESSMENT:    1. Persistent atrial fibrillation (HCC)   2. Chronic anticoagulation   3. Coronary artery disease involving native coronary artery of native heart with angina pectoris (HCC)   4. Hypertensive heart disease with heart failure (HCC)   5. Mixed hyperlipidemia    PLAN:    In order of problems listed above:  He has permanent atrial fibrillation and has failed antiarrhythmic drugs continue his beta-blocker and continue his anticoagulant pending evaluation for Watchman device Stable CAD having no anginal discomfort continue his clopidogrel Cardizem CD for rate control along with his beta-blocker and lipid-lowering therapy rosuvastatin LDL is at target Stable heart failure nicely compensated he will continue his current loop diuretic furosemide along with his beta-blocker LDL at target he will continue his high intensity statin   Next appointment: 4 months   Medication Adjustments/Labs and Tests Ordered: Current medicines are reviewed at length with the patient today.  Concerns regarding medicines are outlined above.  Orders Placed This Encounter  Procedures   Ambulatory referral to Cardiac Electrophysiology   No orders of the defined types were placed in this encounter.    History of Present Illness:    Andrew Cobarrubias. is a 82 y.o. male with a hx of complex cardiac history including persistent atrial fibrillation with chronic anticoagulation hypertensive heart disease with heart failure coronary artery disease hyperlipidemia most recent PCI April 2023 with stent distal left circumflex coronary artery most recent ejection fraction 55 to 60% in January 2023 last seen 08/13/2022.  Compliance with diet, lifestyle and medications: Yes  Is becoming obvious that he is  high risk for bleeding complications and I think he would benefit from referral and consideration of Watchman device Despite his age he continues to form full times recently had a laceration on his arm that bled for several days He had previous GI bleed resulting in hospitalization With CAD and multiple PCI and stents he takes combined antiplatelet anticoagulant We discussed reduced dose anticoagulation recent literature using half dose with edoxaban age 49 and greater seems to be safe however I think he is best served by [employer] device he is quite interested and referral was made today From my perspective he has done better and is not having angina edema shortness of breath orthopnea palpitation or syncope Past Medical History:  Diagnosis Date   Anxiety    Arthritis    Atypical atrial flutter (HCC) 12/14/2014   Bradycardia, sinus 10/15/2014   Burning sensation of feet 01/17/2016   CAD, multiple vessel 10/18/2014   Overview:  DES to [facility] for unstable angina 10/17/14 Diagnostic Summary Severe stenosis of mid Cx likely cause of recent accelerating angina Diffuse severe stenosis of small caliber LAD unchanged from 2012 Normal LV function Paroxysmal SVT noted during procedure 04/23/16:  PCI / Resolute Drug Eluting Stent of the proximal Left Anterior Descending Coronary Artery. Successful PTCA of the ostial 1st Diagonal Coronary Artery   Chronic left-sided low back pain with sciatica 02/21/2016   Coronary artery disease 2003   s/p MI and numerous stents   Coronary artery disease involving native coronary artery of native heart with angina pectoris (HCC) 10/18/2014   Overview:  DES to [facility] for unstable angina 10/17/14 Diagnostic Summary Severe stenosis of  mid Cx likely cause of recent accelerating angina Diffuse severe stenosis of small caliber LAD unchanged from 2012 Normal LV function Paroxysmal SVT noted during procedure 04/23/16:  PCI / Resolute Drug Eluting Stent of the proximal Left Anterior  Descending Coronary Artery. 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   Degenerative spondylolisthesis 05/02/2017   Depression    Dyspnea    WITH EXERTION   Enlarged prostate without lower urinary tract symptoms (luts) 2012   frequent urination and nocturia; sees dr Oda Cogan in [place]   Essential hypertension 10/15/2014   Gastroesophageal reflux disease without esophagitis 04/21/2016   GERD (gastroesophageal reflux disease)    Hyperlipidemia    Hypertension    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   IHD (ischemic heart disease) 10/15/2014   Lower limb pain, inferior, right 12/17/2017   Lumbar radiculopathy 02/21/2016   Myocardial infarction [facility]) 2003   NSTEMI (non-ST elevated myocardial infarction) (HCC) 04/24/2016   Overview:  LHC 04/24/15: Severe stenosis of the LAD & Diagonal bifurcation  LV ejection fraction is 55-60 %  Interventional Summary Successful Angiosculpt PCI / Resolute Drug Eluting Stent of the proximal LAD Successful PTCA of the ostial 1st Diagonal Coronary Artery   Paroxysmal SVT (supraventricular tachycardia) 11/16/2014   Precordial pain 10/15/2014   PVC's (premature ventricular contractions) 04/22/2016   RBBB 10/15/2014   AFIB   S/P angioplasty with stent 08/10/21 with DES to mid OM1 and distal LCX-OM2 08/11/2021   S/P lumbar fusion 04/24/2017   Sacroiliac joint pain 09/10/2018   Sleep disturbances 01/17/2016   Snoring 01/17/2016   Somatic dysfunction of left sacroiliac joint 06/19/2018   Subscapularis (muscle) sprain 03/29/2011   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   Ascorbic Acid (VITAMIN C) 1000 MG tablet Take 1,000 mg by mouth daily.   clopidogrel (PLAVIX) 75 MG tablet TAKE 1 TABLET(75 MG) BY  MOUTH DAILY WITH BREAKFAST   CO-ENZYME Q-10 PO Take 50 mg by mouth daily.   Cyanocobalamin (B-12 PO) Take 1 tablet by mouth daily.   diltiazem (CARDIZEM CD) 120 MG 24 hr capsule Take 120 mg by mouth daily.   DULoxetine (CYMBALTA) 30 MG capsule Take 30 mg by mouth 2 (two) times daily.   ezetimibe (ZETIA) 10 MG tablet TAKE 1 TABLET(10 MG) BY MOUTH EVERY EVENING   finasteride (PROSCAR) 5 MG tablet Take 5 mg by mouth daily.   Flaxseed, Linseed, (FLAXSEED OIL MAX STR) 1300 MG CAPS Take 1,300 mg by mouth daily.   furosemide (LASIX) 80 MG tablet Take 1 tablet (80 mg total) by mouth in the morning.   gemfibrozil (LOPID) 600 MG tablet Take 0.5 tablets (300 mg total) by mouth 2 (two) times daily before a meal.   Glucosamine HCl (GLUCOSAMINE PO) Take 1 tablet by mouth 2 (two) times daily.   isosorbide mononitrate (IMDUR) 60 MG 24 hr tablet Take 1 tablet (60 mg total) by mouth daily.   methocarbamol (ROBAXIN) 500 MG tablet Take 1 tablet (500 mg total) by mouth every 6 (six) hours as needed for muscle spasms.   metoprolol succinate (TOPROL-XL) 25 MG 24 hr tablet Take 1 tablet (25 mg total) by mouth 3 (three) times daily.   Multiple Vitamins-Minerals (MULTIVITAMINS THER. W/MINERALS) TABS Take 1 tablet by mouth daily.  nitroGLYCERIN (NITROSTAT) 0.4 MG SL tablet PLACE 1 TABLET UNDER THE TONGUE EVERY 5 MINUTES AS NEEDED FOR CHEST PAIN   Omega-3 Fatty Acids (FISH OIL PO) Take 1 tablet by mouth daily.   pantoprazole (PROTONIX) 40 MG tablet Take 1 tablet (40 mg total) by mouth daily.   potassium chloride SA (KLOR-CON M) 20 MEQ tablet Take 2 tablets (40 mEq total) by mouth daily. Take 20 mg at night   PUMPKIN SEED PO Take 1 tablet by mouth daily.   rosuvastatin (CRESTOR) 10 MG tablet Take 10 mg by mouth daily.   tamsulosin (FLOMAX) 0.4 MG CAPS capsule Take 0.4 mg by mouth daily.   traZODone (DESYREL) 50 MG tablet Take 50 mg by mouth at bedtime.   TURMERIC CURCUMIN PO Take 1 capsule by mouth daily.   XARELTO 20  MG TABS tablet TAKE 1 TABLET(20 MG) BY MOUTH DAILY WITH SUPPER      EKGs/Labs/Other Studies Reviewed:    The following studies were reviewed today:  Cardiac Studies & Procedures   CARDIAC CATHETERIZATION  CARDIAC CATHETERIZATION 08/10/2021  Narrative   Prox RCA to Mid RCA lesion is 30% stenosed.   Previously placed Prox LAD to Mid LAD stent (unknown type) is  widely patent.  Jailed 1st Diag lesion is 60% stenosed.   Previously placed Prox Cx to Dist Cx stent (unknown type) is  widely patent.   Lesion #1: Dist Cx lesion is 90% stenosed.   A drug-eluting stent was successfully placed using a STENT ONYX FRONTIER 2.25X12.  Deployed at 2.35 mm and postdilated to 2.45 mm in the overlap segment.   Post intervention, there is a 0% residual stenosis.   Lesion #2: 1st Mrg lesion is 80% stenosed with 95% stenosed side branch in Lat 1st Mrg.   Mid LAD lesion is 55% stenosed.   Dist LAD lesion is 50% stenosed.   A drug-eluting stent was successfully placed using a STENT ONYX FRONTIER 2.25X1 -> deployed to 2.45 mm.   Post intervention, there is a 0% residual stenosis.  Post intervention, the side branch was reduced to 100% residual stenosis.  (Patient did have chest pain with stent placement, resolved with sublingual nitroglycerin and fentanyl/Versed.)  Multivessel CAD with 2 culprit lesions: Mid OM1 (85%) at small branch and distal LCx-OM2 (90%) just distal to prior stent.  Mild ISR in RCA stent and ostial diagonal (jailed) 60%.  Tandem 50 to 60% lesions in the mid LAD that are in hinge points.  Not likely flow-limiting. Successful two-vessel PCI of 2 culprit lesions reducing to 0% stenosis each treated with Onyx frontier DES 2.25 mm 12 mm stents dilated to 2.45 mm. Preserved LVEF with low normal roughly 50 to 55%.  Inadequate filling-therefore unable to truly estimate EF and wall motion.  Findings Coronary Findings Diagnostic  Dominance: Right  Left Anterior Descending Vessel is moderate in  size. Previously placed Prox LAD to Mid LAD stent (unknown type) is  widely patent. Mid LAD lesion is 55% stenosed. The lesion is located at the bend. Dist LAD lesion is 50% stenosed. The lesion is located at the bend.  First Diagonal Branch Vessel is small in size. 1st Diag lesion is 60% stenosed. The lesion was previously treated using angioplasty . Rescue PTCA after stenting across  Second Diagonal Branch Vessel is small in size.  Third Diagonal Branch Vessel is small in size.  Left Circumflex Vessel is small. Previously placed Prox Cx to Dist Cx stent (unknown type) is  widely patent. Dist Cx  lesion is 90% stenosed. The lesion is distal to major branch, focal, concentric and irregular. Distal to the previous stent  First Obtuse Marginal Branch Vessel is small in size. 1st Mrg lesion is 80% stenosed with 95% stenosed side branch in Lat 1st Mrg. The lesion is eccentric and ulcerative. At very small sidebranch The lesion was not previously treated .  Left Posterior Atrioventricular Artery Vessel is small in size.  Right Coronary Artery Prox RCA to Mid RCA lesion is 30% stenosed. The lesion was previously treated .  Intervention  Prox Cx to Dist Cx lesion Stent (Also treats lesions: Dist Cx) Lesion length:  8 mm. CATH VISTA GUIDE 6FR XB3.5 guide catheter was inserted. Lesion crossed with guidewire using a WIRE ASAHI PROWATER 180CM. Pre-stent angioplasty was performed using a BALLN SAPPHIRE 2.25X10. Maximum pressure:  10 atm. Inflation time: 20 sec. A drug-eluting stent was successfully placed using a STENT ONYX FRONTIER 2.25X12. Maximum pressure: 14 atm. Inflation time: 30 sec. Stent strut is well apposed. Stent balloon pulled back to the overlapping segment and inflated to high ATM Deployed to 2.35 mm, but that the overlap site dilated to 2.45 mm. Post-stent angioplasty was not performed. Stent balloon at high ATM for overlap-18 ATM Downstream lesion covered with a overlapping  stent, overlapping segment postdilated with stent balloon to high ATM. Post-Intervention Lesion Assessment The intervention was successful. Pre-interventional TIMI flow is 3. Post-intervention TIMI flow is 3. There is a 0% residual stenosis post intervention.  Dist Cx lesion Stent (Also treats lesions: Prox Cx to Dist Cx) See details in Prox Cx to Dist Cx lesion. Post-Intervention Lesion Assessment The intervention was successful. Pre-interventional TIMI flow is 3. Post-intervention TIMI flow is 3. Treated lesion length:  10 mm. No complications occurred at this lesion. There is a 0% residual stenosis post intervention.  1st Mrg lesion with side branch in Lat 1st Mrg Stent - Main Branch Lesion length:  10 mm. CATH VISTA GUIDE 6FR XB3.5 guide catheter was inserted. Lesion crossed with guidewire using a WIRE ASAHI PROWATER 180CM. Pre-stent angioplasty was performed using a BALLN SAPPHIRE 2.25X10. Maximum pressure:  10 atm. Inflation time:  20 sec. A drug-eluting stent was successfully placed using a STENT ONYX FRONTIER 2.25X12. Maximum pressure: 16 atm. Inflation time: 30 sec. Stent strut is well apposed. Deployed to 2.45 mm Post-stent angioplasty was not performed. Same [place] wire was read directed after PCI of the distal LCx into the OM1 Post-Intervention Lesion Assessment The intervention was successful. Pre-interventional TIMI flow is 3. Post-intervention TIMI flow is 3. Treated lesion length:  12 mm. Very small sidebranch occluded =&gt; 4 out of 10 chest pain, resolved with sublingual nitroglycerin and second round of fentanyl and Versed. There is a 0% residual stenosis in the main branch post intervention. There is a 100% residual stenosis in the side branch post intervention.   STRESS TESTS  MYOCARDIAL PERFUSION IMAGING 03/25/2019  Narrative  There was no ST segment deviation noted during stress.  No T wave inversion was noted during stress.  Nongated study due to atrial  fibrillation.  The study is normal.  This is a low risk study.   ECHOCARDIOGRAM  ECHOCARDIOGRAM COMPLETE 01/29/2022  Narrative ECHOCARDIOGRAM REPORT    Patient Name:   Andrew Hardy. Date of Exam: 01/29/2022 Medical Rec #:  [id number]       Height:       66.0 in Accession #:    [id number]      Weight:  204.8 lb Date of Birth:  30-Jun-1940       BSA:          2.020 m Patient Age:    56 years        BP:           94/60 mmHg Patient Gender: M               HR:           92 bpm. Exam Location:  [facility]  Procedure: 2D Echo, 3D Echo, Cardiac Doppler, Color Doppler and Strain Analysis  Indications:    Coronary artery disease involving native coronary artery of native heart with angina pectoris (HCC) [I25.119 (ICD-10-CM)]; Persistent atrial fibrillation (HCC) [I48.19 (ICD-10-CM)]; Chronic anticoagulation [Z79.01 (ICD-10-CM)]; High risk medication use [Z79.899 (ICD-10-CM)]; Hypertensive heart disease with heart failure (HCC) [I11.0 (ICD-10-CM)]; Mixed hyperlipidemia [E78.2 (ICD-10-CM)]; SOB (shortness of breath) on exertion [R06.02 (ICD-10-CM)]  History:        Patient has prior history of Echocardiogram examinations, most recent 05/04/2019. CAD and Previous Myocardial Infarction, Arrythmias:Atrial Fibrillation, SVT and PVC; Risk Factors:Dyslipidemia and Hypertension.  Sonographer:    Margreta Journey RDCS Referring Phys: [id number] Wisdom Seybold J Youlanda Tomassetti  IMPRESSIONS   1. Left ventricular ejection fraction, by estimation, is 50 to 55%. Left ventricular ejection fraction by 3D volume is 53 %. The left ventricle has low normal function. The left ventricle has no regional wall motion abnormalities. Left ventricular diastolic parameters are indeterminate. The average left ventricular global longitudinal strain is -12.8 %. The global longitudinal strain is abnormal. 2. Right ventricular systolic function is normal. The right ventricular size is normal. There is normal pulmonary artery  systolic pressure. 3. Left atrial size was mildly dilated. 4. The mitral valve is degenerative. Mild mitral valve regurgitation. No evidence of mitral stenosis. 5. The aortic valve is tricuspid. Aortic valve regurgitation is mild. Aortic valve sclerosis is present, with no evidence of aortic valve stenosis. 6. Aortic normal DTA. 7. The inferior vena cava is dilated in size with >50% respiratory variability, suggesting right atrial pressure of 8 mmHg.  FINDINGS Left Ventricle: Left ventricular ejection fraction, by estimation, is 50 to 55%. Left ventricular ejection fraction by 3D volume is 53 %. The left ventricle has low normal function. The left ventricle has no regional wall motion abnormalities. The average left ventricular global longitudinal strain is -12.8 %. The global longitudinal strain is abnormal. The left ventricular internal cavity size was normal in size. There is no left ventricular hypertrophy. Left ventricular diastolic parameters are indeterminate.  Right Ventricle: The right ventricular size is normal. No increase in right ventricular wall thickness. Right ventricular systolic function is normal. There is normal pulmonary artery systolic pressure. The tricuspid regurgitant velocity is 2.47 m/s, and with an assumed right atrial pressure of 8 mmHg, the estimated right ventricular systolic pressure is 32.4 mmHg.  Left Atrium: Left atrial size was mildly dilated.  Right Atrium: Right atrial size was normal in size.  Pericardium: There is no evidence of pericardial effusion.  Mitral Valve: The mitral valve is degenerative in appearance. Mild mitral annular calcification. Mild mitral valve regurgitation. No evidence of mitral valve stenosis.  Tricuspid Valve: The tricuspid valve is normal in structure. Tricuspid valve regurgitation is mild . No evidence of tricuspid stenosis.  Aortic Valve: The aortic valve is tricuspid. Aortic valve regurgitation is mild. Aortic regurgitation  PHT measures 724 msec. Aortic valve sclerosis is present, with no evidence of aortic valve stenosis.  Pulmonic Valve: The pulmonic  valve was normal in structure. Pulmonic valve regurgitation is not visualized. No evidence of pulmonic stenosis.  Aorta: The aortic root and ascending aorta are structurally normal, with no evidence of dilitation, normal DTA and the aortic arch was not well visualized.  Venous: A systolic blunting flow pattern is recorded from the right upper pulmonary vein. The inferior vena cava is dilated in size with greater than 50% respiratory variability, suggesting right atrial pressure of 8 mmHg.  IAS/Shunts: No atrial level shunt detected by color flow Doppler.   LEFT VENTRICLE PLAX 2D LVIDd:         4.20 cm         Diastology LVIDs:         3.00 cm         LV e' medial:    8.38 cm/s LV PW:         1.10 cm         LV E/e' medial:  11.9 LV IVS:        1.20 cm         LV e' lateral:   12.60 cm/s LVOT diam:     2.00 cm         LV E/e' lateral: 7.9 LV SV:         48 LV SV Index:   24              2D LVOT Area:     3.14 cm        Longitudinal Strain 2D Strain GLS  -12.8 % LV Volumes (MOD)               Avg: LV vol d, MOD    74.4 ml A2C:                           3D Volume EF LV vol d, MOD    109.0 ml      LV 3D EF:    Left A4C:                                        ventricul LV vol s, MOD    33.3 ml                    ar A2C:                                        ejection LV vol s, MOD    50.0 ml                    fraction A4C:                                        by 3D LV SV MOD A2C:   41.1 ml                    volume is LV SV MOD A4C:   109.0 ml                   53 %. LV SV MOD BP:    52.7 ml  3D Volume EF: 3D EF:  53 % LV EDV:       118 ml LV ESV:       56 ml LV SV:        62 ml  RIGHT VENTRICLE             IVC RV Basal diam:  3.00 cm     IVC diam: 2.50 cm RV S prime:     13.80 cm/s TAPSE (M-mode): 1.6 cm  LEFT ATRIUM             Index         RIGHT ATRIUM           Index LA diam:        4.10 cm 2.03 cm/m   RA Area:     17.30 cm LA Vol (A2C):   75.8 ml 37.52 ml/m  RA Volume:   37.00 ml  18.32 ml/m LA Vol (A4C):   63.7 ml 31.53 ml/m LA Biplane Vol: 70.6 ml 34.95 ml/m AORTIC VALVE LVOT Vmax:   87.70 cm/s LVOT Vmean:  62.500 cm/s LVOT VTI:    0.152 m AI PHT:      724 msec  AORTA Ao Root diam: 3.10 cm Ao Asc diam:  3.40 cm Ao Desc diam: 2.30 cm  MITRAL VALVE               TRICUSPID VALVE MV Area (PHT): 3.53 cm    TR Peak grad:   24.4 mmHg MV Decel Time: 215 msec    TR Vmax:        247.00 cm/s MR Peak grad: 50.7 mmHg MR Vmax:      356.00 cm/s  SHUNTS MV E velocity: 99.80 cm/s  Systemic VTI:  0.15 m Systemic Diam: 2.00 cm  Norman Herrlich MD Electronically signed by Norman Herrlich MD Signature Date/Time: 01/29/2022/12:40:53 PM    Final    MONITORS  LONG TERM MONITOR (3-14 DAYS) 01/29/2022  Narrative Patch Wear Time:  7 days and 10 hours (2023-09-25T08:59:54-0400 to 2023-10-02T19:21:24-0400)  Atrial Fibrillation occurred continuously (100% burden), ranging from 57-177 bpm (avg of 96 bpm). Bundle Branch Block/IVCD was present.  There were no pauses of 3 seconds or greater  Isolated VEs were rare (<1.0%), VE Couplets were rare (<1.0%), and no VE Triplets were present. Ventricular Bigeminy was present.  Heart rate was greater than 110 bpm 26% of the time daytime 2% of the time nighttime.  Her 48 triggered events all associated with atrial fibrillation generally rapid rates greater than 110 bpm and 1 episode with ventricular bigeminy.               Recent Labs: 01/14/2022: NT-Pro BNP 306 08/13/2022: ALT 10; BUN 19; Creatinine, Ser 1.42; Hemoglobin 13.8; Platelets 223; Potassium 4.7; Sodium 143  Recent Lipid Panel    Component Value Date/Time   CHOL 136 08/13/2022 0900   TRIG 146 08/13/2022 0900   HDL 54 08/13/2022 0900   CHOLHDL 2.5 08/13/2022 0900   LDLCALC 57 08/13/2022 0900    Physical Exam:     VS:  BP 118/80 (BP Location: Right Arm, Patient Position: Sitting, Cuff Size: Normal)   Pulse 100   Ht 5\' 7"  (1.702 m)   Wt 214 lb 6.4 oz (97.3 kg)   SpO2 99%   BMI 33.58 kg/m     Wt Readings from Last 3 Encounters:  12/13/22 214 lb 6.4 oz (97.3 kg)  08/13/22 215 lb (97.5 kg)  05/01/22 214 lb (97.1 kg)     GEN:  Well nourished, well developed in no acute distress 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

## 2022-12-13 ENCOUNTER — Encounter: Payer: Self-pay | Admitting: Cardiology

## 2022-12-13 ENCOUNTER — Ambulatory Visit: Payer: PPO | Attending: Cardiology | Admitting: Cardiology

## 2022-12-13 VITALS — BP 118/80 | HR 100 | Ht 67.0 in | Wt 214.4 lb

## 2022-12-13 DIAGNOSIS — Z7901 Long term (current) use of anticoagulants: Secondary | ICD-10-CM

## 2022-12-13 DIAGNOSIS — I4819 Other persistent atrial fibrillation: Secondary | ICD-10-CM | POA: Diagnosis not present

## 2022-12-13 DIAGNOSIS — I110 Hypertensive heart disease with heart failure: Secondary | ICD-10-CM

## 2022-12-13 DIAGNOSIS — I25119 Atherosclerotic heart disease of native coronary artery with unspecified angina pectoris: Secondary | ICD-10-CM | POA: Diagnosis not present

## 2022-12-13 DIAGNOSIS — E782 Mixed hyperlipidemia: Secondary | ICD-10-CM

## 2022-12-13 NOTE — Patient Instructions (Signed)
Medication Instructions:  Your physician recommends that you continue on your current medications as directed. Please refer to the Current Medication list given to you today.  *If you need a refill on your cardiac medications before your next appointment, please call your pharmacy*   Lab Work: None If you have labs (blood work) drawn today and your tests are completely normal, you will receive your results only by: MyChart Message (if you have MyChart) OR A paper copy in the mail If you have any lab test that is abnormal or we need to change your treatment, we will call you to review the results.   Testing/Procedures: None   Follow-Up: At [facility], you and your health needs are our priority.  As part of our continuing mission to provide you with exceptional heart care, we have created designated Provider Care Teams.  These Care Teams include your primary Cardiologist (physician) and Advanced Practice Providers (APPs -  Physician Assistants and Nurse Practitioners) who all work together to provide you with the care you need, when you need it.  We recommend signing up for the patient portal called "MyChart".  Sign up information is provided on this After Visit Summary.  MyChart is used to connect with patients for Virtual Visits (Telemedicine).  Patients are able to view lab/test results, encounter notes, upcoming appointments, etc.  Non-urgent messages can be sent to your provider as well.   To learn more about what you can do with MyChart, go to [email or web address].    Your next appointment:   4 month(s)  Provider:   Brian Munley, MD    Other Instructions None  

## 2022-12-20 ENCOUNTER — Other Ambulatory Visit: Payer: Self-pay | Admitting: Cardiology

## 2022-12-20 NOTE — Telephone Encounter (Signed)
Rx refill sent to pharmacy. 

## 2022-12-24 ENCOUNTER — Other Ambulatory Visit: Payer: Self-pay | Admitting: Cardiology

## 2022-12-24 NOTE — Telephone Encounter (Signed)
Prescription refill request for Xarelto received.  Indication:afib Last office visit:8/24 Weight:97.3  kg Age:82 Scr:1.42  4/24 CrCl:57.1  ml/min  Prescription refilled

## 2023-01-10 ENCOUNTER — Telehealth: Payer: Self-pay | Admitting: Cardiology

## 2023-01-10 NOTE — Telephone Encounter (Signed)
Pt c/o medication issue:  1. Name of Medication: All  2. How are you currently taking this medication (dosage and times per day)?   3. Are you having a reaction (difficulty breathing--STAT)? No  4. What is your medication issue? Pt states from now on he wants all of his prescriptions to be wrote for 30 days bc it is too expensive for 90 days. Please advise

## 2023-01-17 ENCOUNTER — Other Ambulatory Visit: Payer: Self-pay

## 2023-01-17 MED ORDER — METOPROLOL SUCCINATE ER 25 MG PO TB24: 25.0000 mg | ORAL_TABLET | Freq: Three times a day (TID) | ORAL | 11 refills | Status: DC

## 2023-01-17 MED ORDER — ISOSORBIDE MONONITRATE ER 60 MG PO TB24: 60.0000 mg | ORAL_TABLET | Freq: Every day | ORAL | 11 refills | Status: DC

## 2023-01-17 MED ORDER — CLOPIDOGREL BISULFATE 75 MG PO TABS: 75.0000 mg | ORAL_TABLET | Freq: Every day | ORAL | 11 refills | Status: DC

## 2023-01-17 NOTE — Telephone Encounter (Signed)
Called patient and confirmed that he wanted his prescriptions written for 30 days from now on. Patient agreed that he wanted 30 day prescriptions. The patients Plavix, Imdur and Toprol were re-filled. Patient had no further questions at this time.

## 2023-01-28 NOTE — H&P (View-Only) (Signed)
Electrophysiology Office Note:    Date:  01/29/2023   ID:  Andrew Posey., DOB 1941/04/12, MRN [id number]  [facility] Cardiologist:  Norman Herrlich, MD  [facility] Electrophysiologist:  Will Jorja Loa, MD   Referring MD: Lonie Peak, PA-C   Chief Complaint: Atrial fibrillation  History of Present Illness:    Andrew Schoenfeld. is a 82 y.o. malewho I am seeing today for an evaluation of atrial fibrillation at the request of Dr. Dulce Sellar.  The patient was last seen by Dr. Dulce Sellar on December 13, 2022.  The patient has a medical history that includes persistent atrial fibrillation, coronary artery disease, hypertension, hyperlipidemia.  His atrial fibrillation is permanent.  He had a PCI to the left circumflex in April 2023.  At the most recent appointment with Dr. Dulce Sellar, the patient was thought to be extremely high risk for bleeding given a prior history of GI bleed and now the use of antiplatelets and anticoagulation.  The patient is referred to discuss watchman implant as a mechanism to avoid long-term exposure to anticoagulation.  Today he is doing well.  He is interested in avoiding long-term exposure anticoagulation given history of traumatic injuries with severe bleeding and GI bleeding.  He is also concerned about excessive costs.        Their past medical, social and family history was reveiwed.   ROS:   Please see the history of present illness.    All other systems reviewed and are negative.  EKGs/Labs/Other Studies Reviewed:    The following studies were reviewed today:  January 29, 2022 echo EF 50-55 RV normal Mildly dilated left atrium Mild MR Mild AI   August 10, 2021 left heart catheterization   Prox RCA to Mid RCA lesion is 30% stenosed.   Previously placed Prox LAD to Mid LAD stent (unknown type) is  widely patent.  Jailed 1st Diag lesion is 60% stenosed.   Previously placed Prox Cx to Dist Cx stent (unknown type) is  widely patent.   Lesion #1:  Dist Cx lesion is 90% stenosed.   A drug-eluting stent was successfully placed using a STENT ONYX FRONTIER 2.25X12.  Deployed at 2.35 mm and postdilated to 2.45 mm in the overlap segment.   Post intervention, there is a 0% residual stenosis.   Lesion #2: 1st Mrg lesion is 80% stenosed with 95% stenosed side branch in Lat 1st Mrg.   Mid LAD lesion is 55% stenosed.   Dist LAD lesion is 50% stenosed.   A drug-eluting stent was successfully placed using a STENT ONYX FRONTIER 2.25X1 -> deployed to 2.45 mm.   Post intervention, there is a 0% residual stenosis.  Post intervention, the side branch was reduced to 100% residual stenosis.  (Patient did have chest pain with stent placement, resolved with sublingual nitroglycerin and fentanyl/Versed.)   Multivessel CAD with 2 culprit lesions: Mid OM1 (85%) at small branch and distal LCx-OM2 (90%) just distal to prior stent.  Mild ISR in RCA stent and ostial diagonal (jailed) 60%.  Tandem 50 to 60% lesions in the mid LAD that are in hinge points.  Not likely flow-limiting. Successful two-vessel PCI of 2 culprit lesions reducing to 0% stenosis each treated with Onyx frontier DES 2.25 mm 12 mm stents dilated to 2.45 mm. Preserved LVEF with low normal roughly 50 to 55%.  Inadequate filling-therefore unable to truly estimate EF and wall motion.       Physical Exam:    VS:  BP 110/70 (  BP Location: Left Arm, Patient Position: Sitting, Cuff Size: Large)   Pulse 86   Ht 5\' 7"  (1.702 m)   Wt 219 lb 8 oz (99.6 kg)   SpO2 98%   BMI 34.38 kg/m     Wt Readings from Last 3 Encounters:  01/29/23 219 lb 8 oz (99.6 kg)  12/13/22 214 lb 6.4 oz (97.3 kg)  08/13/22 215 lb (97.5 kg)     GEN:  Well nourished, well developed in no acute distress.  Elderly.  Obese. CARDIAC: Irregularly irregular, no murmurs, rubs, gallops RESPIRATORY:  Clear to auscultation without rales, wheezing or rhonchi       ASSESSMENT AND PLAN:    1. Permanent atrial fibrillation (HCC)    2. Gastrointestinal hemorrhage, unspecified gastrointestinal hemorrhage type     #Permanent atrial fibrillation Previously failed antiarrhythmics.  Normal EF.  Rate control strategy is being employed.  Currently on anticoagulation but is thought to be very high risk for bleeding given history of GI bleed and now the use of antiplatelets and anticoagulants.  He was referred to discuss watchman.  -----------------  I have seen Andrew Posey. in the office today who is being considered for a Watchman left atrial appendage closure device. I believe they will benefit from this procedure given their history of atrial fibrillation, CHA2DS2-VASc score of 5 and unadjusted ischemic stroke rate of 7.2% per year. Unfortunately, the patient is not felt to be a long term anticoagulation candidate secondary to hx of GI bleeding and use of antiplatelet drugs. The patient's chart has been reviewed and I feel that they would be a candidate for short term oral anticoagulation after Watchman implant.   It is my belief that after undergoing a LAA closure procedure, Andrew Arvelo. will not need long term anticoagulation which eliminates anticoagulation side effects and major bleeding risk.   Procedural risks for the Watchman implant have been reviewed with the patient including a 0.5% risk of stroke, <1% risk of perforation and <1% risk of device embolization. Other risks include bleeding, vascular damage, tamponade, worsening renal function, and death. The patient understands these risk and wishes to proceed.     The published clinical data on the safety and effectiveness of WATCHMAN include but are not limited to the following: - Holmes DR, Everlene Farrier, Sick P et al. for the PROTECT AF Investigators. Percutaneous closure of the left atrial appendage versus warfarin therapy for prevention of stroke in patients with atrial fibrillation: a randomised non-inferiority trial. Lancet 2009; 374: 534-42. Everlene Farrier, Doshi SK,  Isa Rankin D et al. on behalf of the PROTECT AF Investigators. Percutaneous Left Atrial Appendage Closure for Stroke Prophylaxis in Patients With Atrial Fibrillation 2.3-Year Follow-up of the PROTECT AF (Watchman Left Atrial Appendage System for Embolic Protection in Patients With Atrial Fibrillation) Trial. Circulation 2013; 127:720-729. - Alli O, Doshi S,  Kar S, Reddy VY, Sievert H et al. Quality of Life Assessment in the Randomized PROTECT AF (Percutaneous Closure of the Left Atrial Appendage Versus Warfarin Therapy for Prevention of Stroke in Patients With Atrial Fibrillation) Trial of Patients at Risk for Stroke With Nonvalvular Atrial Fibrillation. J Am Coll Cardiol 2013; 61:1790-8. Aline August DR, Mia Creek, Price M, Whisenant B, Sievert H, Doshi S, Huber K, Reddy V. Prospective randomized evaluation of the Watchman left atrial appendage Device in patients with atrial fibrillation versus long-term warfarin therapy; the PREVAIL trial. Journal of the [employer] of Cardiology, Vol. 4, No. 1, 2014, 1-11. -  Mia Creek, Doshi SK, Sadhu A, Horton R, Osorio J et al. Primary outcome evaluation of a next-generation left atrial appendage closure device: results from the PINNACLE FLX trial. Circulation 2021;143(18)1754-1762.    After today's visit with the patient which was dedicated solely for shared decision making visit regarding LAA closure device, the patient decided to proceed with the LAA appendage closure procedure scheduled to be done in the near future at [facility].  Given abnormal renal function, will not perform preprocedural CT.   HAS-BLED score 4 Hypertension Yes  Abnormal renal and liver function (Dialysis, transplant, Cr >2.26 mg/dL /Cirrhosis or Bilirubin >2x Normal or AST/ALT/AP >3x Normal) No  Stroke No  Bleeding Yes  Labile INR (Unstable/high INR) No  Elderly (>65) Yes  Drugs or alcohol (>= 8 drinks/week, anti-plt or NSAID) Yes   CHA2DS2-VASc Score = 5  The patient's  score is based upon: CHF History: 1 HTN History: 1 Diabetes History: 0 Stroke History: 0 Vascular Disease History: 1 Age Score: 2 Gender Score: 0         Signed, Sheria Lang T. Lalla Brothers, MD, [facility], [facility] 01/29/2023 8:04 AM    Electrophysiology  [facility]

## 2023-01-28 NOTE — Progress Notes (Unsigned)
Electrophysiology Office Note:    Date:  01/29/2023   ID:  Andrew Hardy., DOB 1941/04/12, MRN [id number]  [facility] Cardiologist:  Norman Herrlich, MD  [facility] Electrophysiologist:  Will Jorja Loa, MD   Referring MD: Lonie Peak, PA-C   Chief Complaint: Atrial fibrillation  History of Present Illness:    Andrew Hardy. is a 82 y.o. malewho I am seeing today for an evaluation of atrial fibrillation at the request of Dr. Dulce Sellar.  The patient was last seen by Dr. Dulce Sellar on December 13, 2022.  The patient has a medical history that includes persistent atrial fibrillation, coronary artery disease, hypertension, hyperlipidemia.  His atrial fibrillation is permanent.  He had a PCI to the left circumflex in April 2023.  At the most recent appointment with Dr. Dulce Sellar, the patient was thought to be extremely high risk for bleeding given a prior history of GI bleed and now the use of antiplatelets and anticoagulation.  The patient is referred to discuss watchman implant as a mechanism to avoid long-term exposure to anticoagulation.  Today he is doing well.  He is interested in avoiding long-term exposure anticoagulation given history of traumatic injuries with severe bleeding and GI bleeding.  He is also concerned about excessive costs.        Their past medical, social and family history was reveiwed.   ROS:   Please see the history of present illness.    All other systems reviewed and are negative.  EKGs/Labs/Other Studies Reviewed:    The following studies were reviewed today:  January 29, 2022 echo EF 50-55 RV normal Mildly dilated left atrium Mild MR Mild AI   August 10, 2021 left heart catheterization   Prox RCA to Mid RCA lesion is 30% stenosed.   Previously placed Prox LAD to Mid LAD stent (unknown type) is  widely patent.  Jailed 1st Diag lesion is 60% stenosed.   Previously placed Prox Cx to Dist Cx stent (unknown type) is  widely patent.   Lesion #1:  Dist Cx lesion is 90% stenosed.   A drug-eluting stent was successfully placed using a STENT ONYX FRONTIER 2.25X12.  Deployed at 2.35 mm and postdilated to 2.45 mm in the overlap segment.   Post intervention, there is a 0% residual stenosis.   Lesion #2: 1st Mrg lesion is 80% stenosed with 95% stenosed side branch in Lat 1st Mrg.   Mid LAD lesion is 55% stenosed.   Dist LAD lesion is 50% stenosed.   A drug-eluting stent was successfully placed using a STENT ONYX FRONTIER 2.25X1 -> deployed to 2.45 mm.   Post intervention, there is a 0% residual stenosis.  Post intervention, the side branch was reduced to 100% residual stenosis.  (Patient did have chest pain with stent placement, resolved with sublingual nitroglycerin and fentanyl/Versed.)   Multivessel CAD with 2 culprit lesions: Mid OM1 (85%) at small branch and distal LCx-OM2 (90%) just distal to prior stent.  Mild ISR in RCA stent and ostial diagonal (jailed) 60%.  Tandem 50 to 60% lesions in the mid LAD that are in hinge points.  Not likely flow-limiting. Successful two-vessel PCI of 2 culprit lesions reducing to 0% stenosis each treated with Onyx frontier DES 2.25 mm 12 mm stents dilated to 2.45 mm. Preserved LVEF with low normal roughly 50 to 55%.  Inadequate filling-therefore unable to truly estimate EF and wall motion.       Physical Exam:    VS:  BP 110/70 (  BP Location: Left Arm, Patient Position: Sitting, Cuff Size: Large)   Pulse 86   Ht 5\' 7"  (1.702 m)   Wt 219 lb 8 oz (99.6 kg)   SpO2 98%   BMI 34.38 kg/m     Wt Readings from Last 3 Encounters:  01/29/23 219 lb 8 oz (99.6 kg)  12/13/22 214 lb 6.4 oz (97.3 kg)  08/13/22 215 lb (97.5 kg)     GEN:  Well nourished, well developed in no acute distress.  Elderly.  Obese. CARDIAC: Irregularly irregular, no murmurs, rubs, gallops RESPIRATORY:  Clear to auscultation without rales, wheezing or rhonchi       ASSESSMENT AND PLAN:    1. Permanent atrial fibrillation (HCC)    2. Gastrointestinal hemorrhage, unspecified gastrointestinal hemorrhage type     #Permanent atrial fibrillation Previously failed antiarrhythmics.  Normal EF.  Rate control strategy is being employed.  Currently on anticoagulation but is thought to be very high risk for bleeding given history of GI bleed and now the use of antiplatelets and anticoagulants.  He was referred to discuss watchman.  -----------------  I have seen Andrew Hardy. in the office today who is being considered for a Watchman left atrial appendage closure device. I believe they will benefit from this procedure given their history of atrial fibrillation, CHA2DS2-VASc score of 5 and unadjusted ischemic stroke rate of 7.2% per year. Unfortunately, the patient is not felt to be a long term anticoagulation candidate secondary to hx of GI bleeding and use of antiplatelet drugs. The patient's chart has been reviewed and I feel that they would be a candidate for short term oral anticoagulation after Watchman implant.   It is my belief that after undergoing a LAA closure procedure, Andrew Hardy. will not need long term anticoagulation which eliminates anticoagulation side effects and major bleeding risk.   Procedural risks for the Watchman implant have been reviewed with the patient including a 0.5% risk of stroke, <1% risk of perforation and <1% risk of device embolization. Other risks include bleeding, vascular damage, tamponade, worsening renal function, and death. The patient understands these risk and wishes to proceed.     The published clinical data on the safety and effectiveness of WATCHMAN include but are not limited to the following: - Holmes DR, Everlene Farrier, Sick P et al. for the PROTECT AF Investigators. Percutaneous closure of the left atrial appendage versus warfarin therapy for prevention of stroke in patients with atrial fibrillation: a randomised non-inferiority trial. Lancet 2009; 374: 534-42. Everlene Farrier, Doshi SK,  Isa Rankin D et al. on behalf of the PROTECT AF Investigators. Percutaneous Left Atrial Appendage Closure for Stroke Prophylaxis in Patients With Atrial Fibrillation 2.3-Year Follow-up of the PROTECT AF (Watchman Left Atrial Appendage System for Embolic Protection in Patients With Atrial Fibrillation) Trial. Circulation 2013; 127:720-729. - Alli O, Doshi S,  Kar S, Reddy VY, Sievert H et al. Quality of Life Assessment in the Randomized PROTECT AF (Percutaneous Closure of the Left Atrial Appendage Versus Warfarin Therapy for Prevention of Stroke in Patients With Atrial Fibrillation) Trial of Patients at Risk for Stroke With Nonvalvular Atrial Fibrillation. J Am Coll Cardiol 2013; 61:1790-8. Aline August DR, Mia Creek, Price M, Whisenant B, Sievert H, Doshi S, Huber K, Reddy V. Prospective randomized evaluation of the Watchman left atrial appendage Device in patients with atrial fibrillation versus long-term warfarin therapy; the PREVAIL trial. Journal of the [employer] of Cardiology, Vol. 4, No. 1, 2014, 1-11. -  Mia Creek, Doshi SK, Sadhu A, Horton R, Osorio J et al. Primary outcome evaluation of a next-generation left atrial appendage closure device: results from the PINNACLE FLX trial. Circulation 2021;143(18)1754-1762.    After today's visit with the patient which was dedicated solely for shared decision making visit regarding LAA closure device, the patient decided to proceed with the LAA appendage closure procedure scheduled to be done in the near future at [facility].  Given abnormal renal function, will not perform preprocedural CT.   HAS-BLED score 4 Hypertension Yes  Abnormal renal and liver function (Dialysis, transplant, Cr >2.26 mg/dL /Cirrhosis or Bilirubin >2x Normal or AST/ALT/AP >3x Normal) No  Stroke No  Bleeding Yes  Labile INR (Unstable/high INR) No  Elderly (>65) Yes  Drugs or alcohol (>= 8 drinks/week, anti-plt or NSAID) Yes   CHA2DS2-VASc Score = 5  The patient's  score is based upon: CHF History: 1 HTN History: 1 Diabetes History: 0 Stroke History: 0 Vascular Disease History: 1 Age Score: 2 Gender Score: 0         Signed, Sheria Lang T. Lalla Brothers, MD, [facility], [facility] 01/29/2023 8:04 AM    Electrophysiology  [facility]

## 2023-01-29 ENCOUNTER — Ambulatory Visit: Payer: PPO | Attending: Cardiology | Admitting: Cardiology

## 2023-01-29 ENCOUNTER — Encounter: Payer: Self-pay | Admitting: Cardiology

## 2023-01-29 VITALS — BP 110/70 | HR 86 | Ht 67.0 in | Wt 219.5 lb

## 2023-01-29 DIAGNOSIS — K922 Gastrointestinal hemorrhage, unspecified: Secondary | ICD-10-CM

## 2023-01-29 DIAGNOSIS — I4821 Permanent atrial fibrillation: Secondary | ICD-10-CM | POA: Diagnosis not present

## 2023-01-29 NOTE — Patient Instructions (Addendum)
Medication Instructions:  Your physician recommends that you continue on your current medications as directed. Please refer to the Current Medication list given to you today.  *If you need a refill on your cardiac medications before your next appointment, please call your pharmacy*   Tests/Procedures Your physician has requested that you have Left atrial appendage (LAA) closure device implantation is a procedure to put a small device in the LAA of the heart. The LAA is a small sac in the wall of the heart's left upper chamber. Blood clots can form in this area. The device, Watchman closes the LAA to help prevent a blood clot and stroke.   Follow-Up: At [facility], you and your health needs are our priority.  As part of our continuing mission to provide you with exceptional heart care, we have created designated Provider Care Teams.  These Care Teams include your primary Cardiologist (physician) and Advanced Practice Providers (APPs -  Physician Assistants and Nurse Practitioners) who all work together to provide you with the care you need, when you need it.  Your next appointment:   You will be contacted by Nurse Navigator, Karsten Fells to schedule your pre-procedure visit and procedure date. If you have any questions she can be reached at [phone].

## 2023-02-05 ENCOUNTER — Telehealth: Payer: Self-pay

## 2023-02-05 DIAGNOSIS — M19111 Post-traumatic osteoarthritis, right shoulder: Secondary | ICD-10-CM | POA: Diagnosis not present

## 2023-02-05 DIAGNOSIS — Z96612 Presence of left artificial shoulder joint: Secondary | ICD-10-CM | POA: Diagnosis not present

## 2023-02-05 NOTE — Telephone Encounter (Signed)
Called to discuss Watchman scheduling.  Left message to call back.  Next available dates: 05/01/2023, 05/15/2023, 05/29/2023

## 2023-02-07 NOTE — Telephone Encounter (Signed)
Patient returned call today with a Watchman preference date of 05/01/2023. Discussed that he will need a pre-procedure office visit with labs and full instructions will be reviewed at that time. Thankful for the call.   Georgie Chard NP-C Structural Heart Team  Phone: [phone]

## 2023-02-10 ENCOUNTER — Other Ambulatory Visit: Payer: Self-pay

## 2023-02-10 DIAGNOSIS — K922 Gastrointestinal hemorrhage, unspecified: Secondary | ICD-10-CM

## 2023-02-10 DIAGNOSIS — I4821 Permanent atrial fibrillation: Secondary | ICD-10-CM

## 2023-02-11 NOTE — Telephone Encounter (Signed)
Confirmed procedure date of 05/01/2023. The patient would like to spend the night for observation so he is scheduled last case. Pre-procedure visit scheduled 04/21/2023. The patient was grateful for call and agreed with plan.

## 2023-02-13 ENCOUNTER — Other Ambulatory Visit: Payer: Self-pay | Admitting: Cardiology

## 2023-02-13 ENCOUNTER — Other Ambulatory Visit: Payer: Self-pay | Admitting: Orthopedic Surgery

## 2023-02-13 DIAGNOSIS — Z96612 Presence of left artificial shoulder joint: Secondary | ICD-10-CM | POA: Diagnosis not present

## 2023-02-13 DIAGNOSIS — M25512 Pain in left shoulder: Secondary | ICD-10-CM

## 2023-02-17 ENCOUNTER — Telehealth: Payer: Self-pay

## 2023-02-18 ENCOUNTER — Telehealth: Payer: Self-pay

## 2023-02-18 NOTE — Progress Notes (Unsigned)
[facility]   MULTIDISCIPLINARY HEART VALVE CLINIC                                     Cardiology Office Note:    Date:  02/19/2023   ID:  Andrew Posey., DOB Oct 05, 1940, MRN [id number]  PCP:  Lonie Peak, PA-C  [facility] Cardiologist:  Norman Herrlich, MD  [facility] Electrophysiologist:  Will Jorja Loa, MD   Referring MD: Lonie Peak, PA-C   Pre Watchman visit   History of Present Illness:    Andrew Fusillo. is a 82 y.o. male with a hx of obesity, HTN, HLD, CAD s/p PCI to LCx (07/2021), CKD stage IIIb, RBBB, HFpEF, GI bleeding and permanent atrial fibrillation on Xarelto who presents to clinic for follow up.   At the most recent appointment with Dr. Dulce Sellar, the patient was thought to be extremely high risk for bleeding given a prior history of GI bleed and now the use of antiplatelets for CAD in addition to anticoagulation. He was was referred to Dr. Lalla Brothers for consideration of LAAO with Watchman. Given abnormal renal function pre Watchman CT was deferred.   Today the patient presents to clinic for follow up. Here with his wife. No CP. 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 He is extremely worried about injuring himself on farm machinery and bleeding to death. No LE edema, orthopnea or PND. No dizziness or syncope. No blood in stool or urine. No palpitations.   Past Medical History:  Diagnosis Date   Anxiety    Arthritis    Atypical atrial flutter (HCC) 12/14/2014   Bradycardia, sinus 10/15/2014   Burning sensation of feet 01/17/2016   CAD, multiple vessel 10/18/2014   Overview:  DES to [facility] for unstable angina 10/17/14 Diagnostic Summary Severe stenosis of mid Cx likely cause of recent accelerating angina Diffuse severe stenosis of small caliber LAD unchanged from 2012 Normal LV function Paroxysmal SVT noted during procedure 04/23/16:  PCI / Resolute Drug Eluting Stent of the proximal Left  Anterior Descending Coronary Artery. Successful PTCA of the ostial 1st Diagonal Coronary Artery   Chronic left-sided low back pain with sciatica 02/21/2016   Coronary artery disease 2003   s/p MI and numerous stents   Coronary artery disease involving native coronary artery of native heart with angina pectoris (HCC) 10/18/2014   Overview:  DES to [facility] for unstable angina 10/17/14 Diagnostic Summary Severe stenosis of mid Cx likely cause of recent accelerating angina Diffuse severe stenosis of small caliber LAD unchanged from 2012 Normal LV function Paroxysmal SVT noted during procedure 04/23/16:  PCI / Resolute Drug Eluting Stent of the proximal Left Anterior Descending Coronary Artery. 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   Degenerative spondylolisthesis 05/02/2017   Depression    Dyspnea    WITH EXERTION   Enlarged prostate without lower urinary tract symptoms (luts) 2012   frequent urination and nocturia; sees dr Oda Cogan in [place]   Essential hypertension 10/15/2014   Gastroesophageal reflux disease without esophagitis 04/21/2016   GERD (gastroesophageal reflux disease)    Hyperlipidemia    Hypertension    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   IHD (ischemic heart disease) 10/15/2014   Lower limb pain, inferior, right 12/17/2017   Lumbar radiculopathy 02/21/2016  Myocardial infarction [facility]) 2003   NSTEMI (non-ST elevated myocardial infarction) (HCC) 04/24/2016   Overview:  LHC 04/24/15: Severe stenosis of the LAD & Diagonal bifurcation  LV ejection fraction is 55-60 %  Interventional Summary Successful Angiosculpt PCI / Resolute Drug Eluting Stent of the proximal LAD Successful PTCA of the ostial 1st Diagonal Coronary Artery   Paroxysmal SVT (supraventricular tachycardia) (HCC) 11/16/2014   Precordial pain 10/15/2014   PVC's (premature ventricular contractions) 04/22/2016   RBBB 10/15/2014   AFIB   S/P  angioplasty with stent 08/10/21 with DES to mid OM1 and distal LCX-OM2 08/11/2021   S/P lumbar fusion 04/24/2017   Sacroiliac joint pain 09/10/2018   Sleep disturbances 01/17/2016   Snoring 01/17/2016   Somatic dysfunction of left sacroiliac joint 06/19/2018   Subscapularis (muscle) sprain 03/29/2011   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    Past Surgical History:  Procedure Laterality Date   BIOPSY  04/24/2020   Procedure: BIOPSY;  Surgeon: Beverley Fiedler, MD;  Location: [facility] ENDOSCOPY;  Service: Gastroenterology;;   CARDIAC CATHETERIZATION  10-2010   ptca/stent in [place]   CARDIAC CATHETERIZATION  04/23/2016   COLONOSCOPY WITH PROPOFOL N/A 04/24/2020   Procedure: COLONOSCOPY WITH PROPOFOL;  Surgeon: Beverley Fiedler, MD;  Location: [facility] ENDOSCOPY;  Service: Gastroenterology;  Laterality: N/A;   CORONARY ANGIOPLASTY     CORONARY STENT INTERVENTION N/A 08/10/2021   Procedure: CORONARY STENT INTERVENTION;  Surgeon: Marykay Lex, MD;  Location: [facility] INVASIVE CV LAB;  Service: Cardiovascular;  Laterality: N/A;   ESOPHAGOGASTRODUODENOSCOPY (EGD) WITH PROPOFOL N/A 04/24/2020   Procedure: ESOPHAGOGASTRODUODENOSCOPY (EGD) WITH PROPOFOL;  Surgeon: Beverley Fiedler, MD;  Location: [facility] ENDOSCOPY;  Service: Gastroenterology;  Laterality: N/A;   EYE SURGERY  05-1010   bil cataract ,iol   FRACTURE SURGERY     HEMOSTASIS CONTROL  04/24/2020   Procedure: HEMOSTASIS CONTROL;  Surgeon: Beverley Fiedler, MD;  Location: [facility] ENDOSCOPY;  Service: Gastroenterology;;   I & D EXTREMITY Right 02/23/2015   Procedure: RIGHT RING FINGER REVISION AMPUTATION;  Surgeon: Betha Loa, MD;  Location: [facility] OR;  Service: Orthopedics;  Laterality: Right;   KNEE ARTHROSCOPY W/ MENISCAL REPAIR  2000   left knee   LEFT HEART CATH AND CORONARY ANGIOGRAPHY N/A 08/10/2021   Procedure: LEFT HEART CATH AND CORONARY ANGIOGRAPHY;  Surgeon: Marykay Lex, MD;  Location: [facility] INVASIVE CV LAB;  Service: Cardiovascular;  Laterality: N/A;    LUMBAR EPIDURAL INJECTION  2011   low back pain; DDD   POLYPECTOMY  04/24/2020   Procedure: POLYPECTOMY;  Surgeon: Beverley Fiedler, MD;  Location: [facility] ENDOSCOPY;  Service: Gastroenterology;;   REVERSE SHOULDER ARTHROPLASTY Left 11/23/2021   Procedure: REVERSE SHOULDER ARTHROPLASTY;  Surgeon: Beverely Low, MD;  Location: [facility] ORS;  Service: Orthopedics;  Laterality: Left;   ring finger tramatic qamputation Right    SHOULDER ARTHROSCOPY  03/29/2011   Procedure: ARTHROSCOPY SHOULDER;  Surgeon: Verlee Rossetti;  Location: [facility] OR;  Service: Orthopedics;  Laterality: Right;  RIGHT SHOULDER ARTHROSCOPY WITH OPEN SUBSCAPULAR REPAIR   SHOULDER OPEN ROTATOR CUFF REPAIR     3 on rt shoulder; 2 on left   TRANSFORAMINAL LUMBAR INTERBODY FUSION (TLIF) WITH PEDICLE SCREW FIXATION 1 LEVEL N/A 04/24/2017   Procedure: TRANSFORAMINAL LUMBAR INTERBODY FUSION (TLIF) L4-5;  Surgeon: Venita Lick, MD;  Location: [facility] OR;  Service: Orthopedics;  Laterality: N/A;  4 hrs    Current Medications: Current Meds  Medication Sig   acetaminophen (TYLENOL) 325 MG tablet Take  2 tablets (650 mg total) by mouth every 6 (six) hours as needed (pain).   Ascorbic Acid (VITAMIN C) 1000 MG tablet Take 1,000 mg by mouth daily.   clopidogrel (PLAVIX) 75 MG tablet Take 1 tablet (75 mg total) by mouth daily.   CO-ENZYME Q-10 PO Take 50 mg by mouth daily.   Cyanocobalamin (B-12 PO) Take 1 tablet by mouth daily.   diltiazem (CARDIZEM CD) 120 MG 24 hr capsule Take 120 mg by mouth daily.   DULoxetine (CYMBALTA) 30 MG capsule Take 30 mg by mouth 2 (two) times daily.   ezetimibe (ZETIA) 10 MG tablet Take 1 tablet (10 mg total) by mouth daily.   finasteride (PROSCAR) 5 MG tablet Take 5 mg by mouth daily.   Flaxseed, Linseed, (FLAXSEED OIL MAX STR) 1300 MG CAPS Take 1,300 mg by mouth daily.   furosemide (LASIX) 80 MG tablet Take 1 tablet (80 mg total) by mouth in the morning. (Patient taking differently: Take 40-80 mg by mouth See admin instructions. 80  mg in the morning, 40 mg in the evening)   gemfibrozil (LOPID) 600 MG tablet Take 0.5 tablets (300 mg total) by mouth 2 (two) times daily before a meal.   Glucosamine HCl (GLUCOSAMINE PO) Take 1 tablet by mouth 2 (two) times daily.   isosorbide mononitrate (IMDUR) 60 MG 24 hr tablet Take 1 tablet (60 mg total) by mouth daily.   metoprolol succinate (TOPROL-XL) 25 MG 24 hr tablet Take 1 tablet (25 mg total) by mouth 3 (three) times daily.   Multiple Vitamins-Minerals (MULTIVITAMINS THER. W/MINERALS) TABS Take 1 tablet by mouth daily.   nitroGLYCERIN (NITROSTAT) 0.4 MG SL tablet PLACE 1 TABLET UNDER THE TONGUE EVERY 5 MINUTES AS NEEDED FOR CHEST PAIN   Omega-3 Fatty Acids (FISH OIL PO) Take 1 tablet by mouth daily.   pantoprazole (PROTONIX) 40 MG tablet Take 1 tablet (40 mg total) by mouth daily.   potassium chloride SA (KLOR-CON M) 20 MEQ tablet TAKE 2 TABLETS BY MOUTH DAILY THEN TAKE 1 TABLET BY MOUTH AT NIGHT   rosuvastatin (CRESTOR) 10 MG tablet Take 10 mg by mouth daily.   tamsulosin (FLOMAX) 0.4 MG CAPS capsule Take 0.4 mg by mouth daily.   traZODone (DESYREL) 100 MG tablet Take 100 mg by mouth at bedtime.   TURMERIC CURCUMIN PO Take 1 capsule by mouth daily.   XARELTO 20 MG TABS tablet TAKE 1 TABLET(20 MG) BY MOUTH DAILY WITH SUPPER      ROS:   Please see the history of present illness.    All other systems reviewed and are negative.  EKGs   EKG:  EKG is ordered today.  The ekg ordered today demonstrates chronic afib with RBBB. HR 95  Recent Labs: 08/13/2022: ALT 10; BUN 19; Creatinine, Ser 1.42; Hemoglobin 13.8; Platelets 223; Potassium 4.7; Sodium 143  Recent Lipid Panel    Component Value Date/Time   CHOL 136 08/13/2022 0900   TRIG 146 08/13/2022 0900   HDL 54 08/13/2022 0900   CHOLHDL 2.5 08/13/2022 0900   LDLCALC 57 08/13/2022 0900     Risk Assessment/Calculations:    HAS-BLED score 4 Hypertension Yes  Abnormal renal and liver function (Dialysis, transplant, Cr  >2.26 mg/dL /Cirrhosis or Bilirubin >2x Normal or AST/ALT/AP >3x Normal) No  Stroke No  Bleeding Yes  Labile INR (Unstable/high INR) No  Elderly (>65) Yes  Drugs or alcohol (>= 8 drinks/week, anti-plt or NSAID) Yes    CHA2DS2-VASc Score = 5  The patient's  score is based upon: CHF History: 1 HTN History: 1 Diabetes History: 0 Stroke History: 0 Vascular Disease History: 1 Age Score: 2 Gender Score: 0  Physical Exam:    VS:  BP (!) 82/60   Pulse 95   Ht 5\' 7"  (1.702 m)   Wt 214 lb 12.8 oz (97.4 kg)   SpO2 96%   BMI 33.64 kg/m     Wt Readings from Last 3 Encounters:  02/19/23 214 lb 12.8 oz (97.4 kg)  01/29/23 219 lb 8 oz (99.6 kg)  12/13/22 214 lb 6.4 oz (97.3 kg)     GEN: Well nourished, well developed in no acute distress, overweight NECK: No JVD; No carotid bruits CARDIAC: irreg irreg, no murmurs, rubs, gallops RESPIRATORY:  Clear to auscultation without rales, wheezing or rhonchi  ABDOMEN: Soft, non-tender, non-distended EXTREMITIES:  No edema; No deformity   ASSESSMENT:    1. Permanent atrial fibrillation (HCC)   2. Gastrointestinal hemorrhage, unspecified gastrointestinal hemorrhage type   3. Coronary artery disease involving native coronary artery of native heart with angina pectoris (HCC)   4. Hypertensive heart disease with heart failure (HCC)   5. Pre-op evaluation    PLAN:    In order of problems listed above:  Permanent atrial fibrillation with high risk of bleeding: continue Xarelto and Plavix for now. Medication plan to be determined at the time of surgery. No pre planning CT due to renal insufficiency. Plan for TEE at the time of LAAO- set up for 02/20/23. Pre procedure letter with soap given to pt. ECG/BMET/CBC today.  -- HR well controlled on Cardizem CD 120mg  daily and Toprol XL 25mg  TID.  CAD: with history of stenting and need for antiplatelet therapy with Plavix 75mg  daily. Continue Crestor 10mg  daily  Hypertensive heart disease with heart  failure: NYHA class II symptoms.   CKD stage IIIa: most recent creat was 1.4 in 07/2022. Will update labs today  Medication Adjustments/Labs and Tests Ordered: Current medicines are reviewed at length with the patient today.  Concerns regarding medicines are outlined above.  Orders Placed This Encounter  Procedures   Basic metabolic panel   CBC   EKG 12-Lead   No orders of the defined types were placed in this encounter.   Patient Instructions  Medication Instructions:  Your physician recommends that you continue on your current medications as directed. Please refer to the Current Medication list given to you today.  *If you need a refill on your cardiac medications before your next appointment, please call your pharmacy*   Lab Work: BMET, CBC (Stat) - Today   If you have labs (blood work) drawn today and your tests are completely normal, you will receive your results only by: MyChart Message (if you have MyChart) OR A paper copy in the mail If you have any lab test that is abnormal or we need to change your treatment, we will call you to review the results.   Testing/Procedures: You are scheduled for a Left Atrial Appendage Device Implantation on Thursday, February 20, 2023 with Dr. Steffanie Dunn.   Follow-Up: Follow up will be scheduled during your hospital stay   Other Instructions     Signed, Cline Crock, PA-C  02/19/2023 3:09 PM    [facility]

## 2023-02-18 NOTE — Telephone Encounter (Signed)
Due to cancellation, offered the patient LAAO date of 02/20/2023. Scheduled him for pre-procedure visit tomorrow, 02/20/2023. He was grateful for call and agreed with plan.

## 2023-02-19 ENCOUNTER — Ambulatory Visit: Payer: PPO | Attending: Physician Assistant | Admitting: Physician Assistant

## 2023-02-19 VITALS — BP 82/60 | HR 95 | Ht 67.0 in | Wt 214.8 lb

## 2023-02-19 DIAGNOSIS — Z01818 Encounter for other preprocedural examination: Secondary | ICD-10-CM | POA: Diagnosis not present

## 2023-02-19 DIAGNOSIS — K922 Gastrointestinal hemorrhage, unspecified: Secondary | ICD-10-CM

## 2023-02-19 DIAGNOSIS — I4821 Permanent atrial fibrillation: Secondary | ICD-10-CM | POA: Diagnosis not present

## 2023-02-19 DIAGNOSIS — I110 Hypertensive heart disease with heart failure: Secondary | ICD-10-CM | POA: Diagnosis not present

## 2023-02-19 DIAGNOSIS — I25119 Atherosclerotic heart disease of native coronary artery with unspecified angina pectoris: Secondary | ICD-10-CM

## 2023-02-19 NOTE — Patient Instructions (Signed)
Medication Instructions:  Your physician recommends that you continue on your current medications as directed. Please refer to the Current Medication list given to you today.  *If you need a refill on your cardiac medications before your next appointment, please call your pharmacy*   Lab Work: BMET, CBC (Stat) - Today   If you have labs (blood work) drawn today and your tests are completely normal, you will receive your results only by: MyChart Message (if you have MyChart) OR A paper copy in the mail If you have any lab test that is abnormal or we need to change your treatment, we will call you to review the results.   Testing/Procedures: You are scheduled for a Left Atrial Appendage Device Implantation on Thursday, February 20, 2023 with Dr. Steffanie Dunn.   Follow-Up: Follow up will be scheduled during your hospital stay   Other Instructions

## 2023-02-20 ENCOUNTER — Inpatient Hospital Stay (HOSPITAL_COMMUNITY): Payer: PPO

## 2023-02-20 ENCOUNTER — Encounter (HOSPITAL_COMMUNITY): Payer: Self-pay | Admitting: Cardiology

## 2023-02-20 ENCOUNTER — Inpatient Hospital Stay (HOSPITAL_COMMUNITY)
Admission: RE | Admit: 2023-02-20 | Discharge: 2023-02-21 | DRG: 274 | Disposition: A | Discharge status: Home / Self Care | Payer: PPO | Attending: Cardiology | Admitting: Cardiology

## 2023-02-20 ENCOUNTER — Inpatient Hospital Stay (HOSPITAL_COMMUNITY): Payer: PPO | Admitting: Anesthesiology

## 2023-02-20 ENCOUNTER — Other Ambulatory Visit: Payer: Self-pay

## 2023-02-20 ENCOUNTER — Encounter (HOSPITAL_COMMUNITY)
Admission: RE | Disposition: A | Discharge status: Home / Self Care | Payer: Self-pay | Source: Home / Self Care | Attending: Cardiology

## 2023-02-20 DIAGNOSIS — I2510 Atherosclerotic heart disease of native coronary artery without angina pectoris: Secondary | ICD-10-CM | POA: Diagnosis present

## 2023-02-20 DIAGNOSIS — Z01818 Encounter for other preprocedural examination: Secondary | ICD-10-CM | POA: Diagnosis not present

## 2023-02-20 DIAGNOSIS — I130 Hypertensive heart and chronic kidney disease with heart failure and stage 1 through stage 4 chronic kidney disease, or unspecified chronic kidney disease: Secondary | ICD-10-CM | POA: Diagnosis present

## 2023-02-20 DIAGNOSIS — I4510 Unspecified right bundle-branch block: Secondary | ICD-10-CM | POA: Diagnosis present

## 2023-02-20 DIAGNOSIS — Z7901 Long term (current) use of anticoagulants: Secondary | ICD-10-CM

## 2023-02-20 DIAGNOSIS — Z955 Presence of coronary angioplasty implant and graft: Secondary | ICD-10-CM | POA: Diagnosis not present

## 2023-02-20 DIAGNOSIS — E1122 Type 2 diabetes mellitus with diabetic chronic kidney disease: Secondary | ICD-10-CM | POA: Diagnosis present

## 2023-02-20 DIAGNOSIS — I25119 Atherosclerotic heart disease of native coronary artery with unspecified angina pectoris: Secondary | ICD-10-CM | POA: Diagnosis not present

## 2023-02-20 DIAGNOSIS — Z87891 Personal history of nicotine dependence: Secondary | ICD-10-CM | POA: Diagnosis not present

## 2023-02-20 DIAGNOSIS — Z6834 Body mass index (BMI) 34.0-34.9, adult: Secondary | ICD-10-CM | POA: Diagnosis not present

## 2023-02-20 DIAGNOSIS — I4821 Permanent atrial fibrillation: Principal | ICD-10-CM | POA: Diagnosis present

## 2023-02-20 DIAGNOSIS — E669 Obesity, unspecified: Secondary | ICD-10-CM | POA: Diagnosis present

## 2023-02-20 DIAGNOSIS — N1832 Chronic kidney disease, stage 3b: Secondary | ICD-10-CM | POA: Diagnosis present

## 2023-02-20 DIAGNOSIS — I082 Rheumatic disorders of both aortic and tricuspid valves: Secondary | ICD-10-CM | POA: Diagnosis present

## 2023-02-20 DIAGNOSIS — I252 Old myocardial infarction: Secondary | ICD-10-CM | POA: Diagnosis not present

## 2023-02-20 DIAGNOSIS — Z95820 Peripheral vascular angioplasty status with implants and grafts: Secondary | ICD-10-CM

## 2023-02-20 DIAGNOSIS — Z471 Aftercare following joint replacement surgery: Secondary | ICD-10-CM | POA: Diagnosis not present

## 2023-02-20 DIAGNOSIS — Z7902 Long term (current) use of antithrombotics/antiplatelets: Secondary | ICD-10-CM

## 2023-02-20 DIAGNOSIS — I4891 Unspecified atrial fibrillation: Secondary | ICD-10-CM

## 2023-02-20 DIAGNOSIS — I110 Hypertensive heart disease with heart failure: Secondary | ICD-10-CM | POA: Diagnosis not present

## 2023-02-20 DIAGNOSIS — K922 Gastrointestinal hemorrhage, unspecified: Secondary | ICD-10-CM | POA: Diagnosis present

## 2023-02-20 DIAGNOSIS — K22719 Barrett's esophagus with dysplasia, unspecified: Secondary | ICD-10-CM | POA: Diagnosis present

## 2023-02-20 DIAGNOSIS — Z006 Encounter for examination for normal comparison and control in clinical research program: Secondary | ICD-10-CM | POA: Diagnosis not present

## 2023-02-20 DIAGNOSIS — E785 Hyperlipidemia, unspecified: Secondary | ICD-10-CM | POA: Diagnosis present

## 2023-02-20 DIAGNOSIS — I10 Essential (primary) hypertension: Secondary | ICD-10-CM | POA: Diagnosis present

## 2023-02-20 DIAGNOSIS — I5032 Chronic diastolic (congestive) heart failure: Secondary | ICD-10-CM | POA: Diagnosis present

## 2023-02-20 DIAGNOSIS — Z96612 Presence of left artificial shoulder joint: Secondary | ICD-10-CM | POA: Diagnosis not present

## 2023-02-20 DIAGNOSIS — Z95818 Presence of other cardiac implants and grafts: Principal | ICD-10-CM

## 2023-02-20 DIAGNOSIS — I5022 Chronic systolic (congestive) heart failure: Secondary | ICD-10-CM | POA: Diagnosis not present

## 2023-02-20 DIAGNOSIS — K219 Gastro-esophageal reflux disease without esophagitis: Secondary | ICD-10-CM | POA: Diagnosis present

## 2023-02-20 HISTORY — DX: Permanent atrial fibrillation: I48.21

## 2023-02-20 HISTORY — DX: Unspecified atrial fibrillation: I48.91

## 2023-02-20 HISTORY — DX: Presence of other cardiac implants and grafts: Z95.818

## 2023-02-20 HISTORY — PX: TRANSESOPHAGEAL ECHOCARDIOGRAM (CATH LAB): EP1270

## 2023-02-20 HISTORY — PX: LEFT ATRIAL APPENDAGE OCCLUSION: EP1229

## 2023-02-20 LAB — TYPE AND SCREEN
ABO/RH(D): A POS
Antibody Screen: NEGATIVE

## 2023-02-20 LAB — BASIC METABOLIC PANEL
Anion gap: 10 (ref 5–15)
BUN/Creatinine Ratio: 15 (ref 10–24)
BUN: 18 mg/dL (ref 8–27)
BUN: 15 mg/dL (ref 8–23)
CO2: 24 mmol/L (ref 20–29)
CO2: 25 mmol/L (ref 22–32)
Calcium: 8.9 mg/dL (ref 8.6–10.2)
Calcium: 8.9 mg/dL (ref 8.9–10.3)
Chloride: 96 mmol/L (ref 96–106)
Chloride: 106 mmol/L (ref 98–111)
Creatinine, Ser: 1.21 mg/dL (ref 0.76–1.27)
Creatinine, Ser: 1.19 mg/dL (ref 0.61–1.24)
GFR, Estimated: >60 mL/min (ref >60)
Glucose, Bld: 127 mg/dL — ABNORMAL HIGH (ref 70–99)
Glucose: 126 mg/dL — ABNORMAL HIGH (ref 70–99)
Potassium: 3.4 mmol/L — ABNORMAL LOW (ref 3.5–5.2)
Potassium: 3.6 mmol/L (ref 3.5–5.1)
Sodium: 139 mmol/L (ref 134–144)
Sodium: 141 mmol/L (ref 135–145)
eGFR: 61 mL/min/{1.73_m2} (ref >59)

## 2023-02-20 LAB — CBC
Hematocrit: 39.4 % (ref 37.5–51.0)
Hemoglobin: 12.7 g/dL — ABNORMAL LOW (ref 13.0–17.7)
MCH: 28.8 pg (ref 26.6–33.0)
MCHC: 32.2 g/dL (ref 31.5–35.7)
MCV: 89 fL (ref 79–97)
Platelets: 208 10*3/uL (ref 150–450)
RBC: 4.41 x10E6/uL (ref 4.14–5.80)
RDW: 13.1 % (ref 11.6–15.4)
WBC: 8 10*3/uL (ref 3.4–10.8)

## 2023-02-20 LAB — POCT ACTIVATED CLOTTING TIME: Activated Clotting Time: 429 s

## 2023-02-20 LAB — SURGICAL PCR SCREEN
MRSA, PCR: NEGATIVE
Staphylococcus aureus: NEGATIVE

## 2023-02-20 LAB — ECHO TEE

## 2023-02-20 LAB — GLUCOSE, CAPILLARY: Glucose-Capillary: 112 mg/dL — ABNORMAL HIGH (ref 70–99)

## 2023-02-20 SURGERY — LEFT ATRIAL APPENDAGE OCCLUSION
Anesthesia: General

## 2023-02-20 MED ORDER — METOPROLOL SUCCINATE ER 25 MG PO TB24
25.0000 mg | ORAL_TABLET | Freq: Three times a day (TID) | ORAL | Status: DC
  Administered 2023-02-20 – 2023-02-21 (×2): 25 mg via ORAL
  Filled 2023-02-20 (×3): qty 1

## 2023-02-20 MED ORDER — SODIUM CHLORIDE 0.9 % IV SOLN: INTRAVENOUS | Status: DC

## 2023-02-20 MED ORDER — ONDANSETRON HCL 4 MG/2ML IJ SOLN
INTRAMUSCULAR | Status: DC | PRN
  Administered 2023-02-20: 4 mg via INTRAVENOUS

## 2023-02-20 MED ORDER — IOHEXOL 350 MG/ML SOLN
INTRAVENOUS | Status: DC | PRN
  Administered 2023-02-20: 10 mL

## 2023-02-20 MED ORDER — ACETAMINOPHEN 500 MG PO TABS
1000.0000 mg | ORAL_TABLET | Freq: Once | ORAL | Status: AC
  Administered 2023-02-20: 1000 mg via ORAL
  Filled 2023-02-20: qty 2

## 2023-02-20 MED ORDER — ONDANSETRON HCL 4 MG/2ML IJ SOLN: 4.0000 mg | Freq: Four times a day (QID) | INTRAMUSCULAR | Status: DC | PRN

## 2023-02-20 MED ORDER — CHLORHEXIDINE GLUCONATE 4 % EX SOLN
Freq: Once | CUTANEOUS | Status: DC
Start: 2023-02-20 — End: 2023-02-20

## 2023-02-20 MED ORDER — PHENYLEPHRINE HCL-NACL 20-0.9 MG/250ML-% IV SOLN
INTRAVENOUS | Status: DC | PRN
  Administered 2023-02-20: 50 ug/min via INTRAVENOUS

## 2023-02-20 MED ORDER — TRAZODONE HCL 100 MG PO TABS
100.0000 mg | ORAL_TABLET | Freq: Every day | ORAL | Status: DC
  Administered 2023-02-20: 100 mg via ORAL
  Filled 2023-02-20: qty 1

## 2023-02-20 MED ORDER — SUGAMMADEX SODIUM 200 MG/2ML IV SOLN
INTRAVENOUS | Status: DC | PRN
  Administered 2023-02-20: 200 mg via INTRAVENOUS

## 2023-02-20 MED ORDER — SODIUM CHLORIDE 0.9% FLUSH
10.0000 mL | Freq: Two times a day (BID) | INTRAVENOUS | Status: DC
Start: 2023-02-20 — End: 2023-02-20

## 2023-02-20 MED ORDER — DILTIAZEM HCL ER COATED BEADS 120 MG PO CP24
120.0000 mg | ORAL_CAPSULE | Freq: Every day | ORAL | Status: DC
  Administered 2023-02-21: 120 mg via ORAL
  Filled 2023-02-20: qty 1

## 2023-02-20 MED ORDER — HEPARIN (PORCINE) IN NACL 2000-0.9 UNIT/L-% IV SOLN
INTRAVENOUS | Status: DC | PRN
  Administered 2023-02-20: 1000 mL

## 2023-02-20 MED ORDER — PROTAMINE SULFATE 10 MG/ML IV SOLN
INTRAVENOUS | Status: DC | PRN
  Administered 2023-02-20: 35 mg via INTRAVENOUS

## 2023-02-20 MED ORDER — SODIUM CHLORIDE 0.9% FLUSH: 3.0000 mL | Freq: Two times a day (BID) | INTRAVENOUS | Status: DC

## 2023-02-20 MED ORDER — ROSUVASTATIN CALCIUM 5 MG PO TABS
10.0000 mg | ORAL_TABLET | Freq: Every day | ORAL | Status: DC
  Administered 2023-02-20 – 2023-02-21 (×2): 10 mg via ORAL
  Filled 2023-02-20 (×2): qty 2

## 2023-02-20 MED ORDER — FENTANYL CITRATE (PF) 100 MCG/2ML IJ SOLN: 25.0000 ug | INTRAMUSCULAR | Status: DC | PRN

## 2023-02-20 MED ORDER — CALCIUM CHLORIDE 10 % IV SOLN
INTRAVENOUS | Status: DC | PRN
  Administered 2023-02-20 (×4): 250 mg via INTRAVENOUS

## 2023-02-20 MED ORDER — FINASTERIDE 5 MG PO TABS
5.0000 mg | ORAL_TABLET | Freq: Every day | ORAL | Status: DC
  Administered 2023-02-20 – 2023-02-21 (×2): 5 mg via ORAL
  Filled 2023-02-20 (×2): qty 1

## 2023-02-20 MED ORDER — DEXAMETHASONE SODIUM PHOSPHATE 10 MG/ML IJ SOLN
INTRAMUSCULAR | Status: DC | PRN
  Administered 2023-02-20: 10 mg via INTRAVENOUS

## 2023-02-20 MED ORDER — CHLORHEXIDINE GLUCONATE 0.12 % MT SOLN
OROMUCOSAL | Status: AC
  Administered 2023-02-20: 15 mL
  Filled 2023-02-20: qty 15

## 2023-02-20 MED ORDER — LIDOCAINE 2% (20 MG/ML) 5 ML SYRINGE
INTRAMUSCULAR | Status: DC | PRN
  Administered 2023-02-20: 80 mg via INTRAVENOUS

## 2023-02-20 MED ORDER — GEMFIBROZIL 600 MG PO TABS
300.0000 mg | ORAL_TABLET | Freq: Two times a day (BID) | ORAL | Status: DC
  Administered 2023-02-20 – 2023-02-21 (×2): 300 mg via ORAL
  Filled 2023-02-20 (×3): qty 0.5

## 2023-02-20 MED ORDER — SODIUM CHLORIDE 0.9% FLUSH: 3.0000 mL | INTRAVENOUS | Status: DC | PRN

## 2023-02-20 MED ORDER — RIVAROXABAN 20 MG PO TABS
20.0000 mg | ORAL_TABLET | Freq: Every day | ORAL | Status: DC
  Administered 2023-02-20: 20 mg via ORAL
  Filled 2023-02-20: qty 1

## 2023-02-20 MED ORDER — ONDANSETRON HCL 4 MG/2ML IJ SOLN: 4.0000 mg | Freq: Once | INTRAMUSCULAR | Status: DC | PRN

## 2023-02-20 MED ORDER — PROPOFOL 10 MG/ML IV BOLUS
INTRAVENOUS | Status: DC | PRN
  Administered 2023-02-20: 120 mg via INTRAVENOUS

## 2023-02-20 MED ORDER — LACTATED RINGERS IV SOLN: INTRAVENOUS | Status: DC | PRN

## 2023-02-20 MED ORDER — HEPARIN SODIUM (PORCINE) 1000 UNIT/ML IJ SOLN
INTRAMUSCULAR | Status: DC | PRN
  Administered 2023-02-20: 15000 [IU] via INTRAVENOUS

## 2023-02-20 MED ORDER — FENTANYL CITRATE (PF) 250 MCG/5ML IJ SOLN
INTRAMUSCULAR | Status: DC | PRN
  Administered 2023-02-20: 50 ug via INTRAVENOUS

## 2023-02-20 MED ORDER — PROPOFOL 500 MG/50ML IV EMUL
INTRAVENOUS | Status: DC | PRN
  Administered 2023-02-20: 100 ug/kg/min via INTRAVENOUS

## 2023-02-20 MED ORDER — CLOPIDOGREL BISULFATE 75 MG PO TABS
75.0000 mg | ORAL_TABLET | Freq: Every day | ORAL | Status: DC
  Administered 2023-02-21: 75 mg via ORAL
  Filled 2023-02-20: qty 1

## 2023-02-20 MED ORDER — CEFAZOLIN SODIUM-DEXTROSE 2-4 GM/100ML-% IV SOLN
2.0000 g | INTRAVENOUS | Status: AC
  Administered 2023-02-20: 2 g via INTRAVENOUS
  Filled 2023-02-20: qty 100

## 2023-02-20 MED ORDER — ROCURONIUM BROMIDE 10 MG/ML (PF) SYRINGE
PREFILLED_SYRINGE | INTRAVENOUS | Status: DC | PRN
  Administered 2023-02-20: 60 mg via INTRAVENOUS
  Administered 2023-02-20: 20 mg via INTRAVENOUS

## 2023-02-20 MED ORDER — FENTANYL CITRATE (PF) 100 MCG/2ML IJ SOLN
INTRAMUSCULAR | Status: AC
  Filled 2023-02-20: qty 2

## 2023-02-20 MED ORDER — PANTOPRAZOLE SODIUM 40 MG PO TBEC
40.0000 mg | DELAYED_RELEASE_TABLET | Freq: Every day | ORAL | Status: DC
  Administered 2023-02-20 – 2023-02-21 (×2): 40 mg via ORAL
  Filled 2023-02-20 (×2): qty 1

## 2023-02-20 MED ORDER — ISOSORBIDE MONONITRATE ER 60 MG PO TB24
60.0000 mg | ORAL_TABLET | Freq: Every day | ORAL | Status: DC
  Administered 2023-02-21: 60 mg via ORAL
  Filled 2023-02-20: qty 1

## 2023-02-20 MED ORDER — DULOXETINE HCL 30 MG PO CPEP
30.0000 mg | ORAL_CAPSULE | Freq: Two times a day (BID) | ORAL | Status: DC
  Administered 2023-02-20 – 2023-02-21 (×3): 30 mg via ORAL
  Filled 2023-02-20 (×3): qty 1

## 2023-02-20 MED ORDER — EZETIMIBE 10 MG PO TABS
10.0000 mg | ORAL_TABLET | Freq: Every day | ORAL | Status: DC
  Administered 2023-02-20 – 2023-02-21 (×2): 10 mg via ORAL
  Filled 2023-02-20 (×2): qty 1

## 2023-02-20 SURGICAL SUPPLY — 20 items
BLANKET WARM UNDERBOD FULL ACC (MISCELLANEOUS) ×1 IMPLANT
CATH INFINITI 5FR ANG PIGTAIL (CATHETERS) IMPLANT
CLOSURE PERCLOSE PROSTYLE (VASCULAR PRODUCTS) IMPLANT
DEVICE WATCHMAN FLX PRO PROC (KITS) IMPLANT
DEVICE WATCHMAN FXD CRV PROC (KITS) IMPLANT
DILATOR VESSEL 38 20CM 12FR (INTRODUCER) IMPLANT
KIT SHEA VERSACROSS LAAC CONNE (KITS) IMPLANT
PACK CARDIAC CATHETERIZATION (CUSTOM PROCEDURE TRAY) ×1 IMPLANT
PAD DEFIB RADIO PHYSIO CONN (PAD) ×1 IMPLANT
SHEATH PERFORMER 16FR 30 (SHEATH) IMPLANT
SHEATH PINNACLE 8F 10CM (SHEATH) IMPLANT
SHEATH PROBE COVER 6X72 (BAG) ×1 IMPLANT
SYR CONTROL 10ML ANGIOGRAPHIC (SYRINGE) IMPLANT
SYS WATCHMAN FXD DBL (SHEATH) ×1
SYSTEM WATCHMAN FXD DBL (SHEATH) IMPLANT
TRANSDUCER W/STOPCOCK (MISCELLANEOUS) ×1 IMPLANT
TUBING CIL FLEX 10 FLL-RA (TUBING) ×1 IMPLANT
WATCHMAN FLX PRO 24 (Prosthesis & Implant Heart) IMPLANT
WATCHMAN FLX PRO PROCEDURE (KITS) ×1 IMPLANT
WATCHMAN FXD CRV SYS PROCEDURE (KITS) ×1 IMPLANT

## 2023-02-20 NOTE — Discharge Instructions (Signed)

## 2023-02-20 NOTE — Progress Notes (Signed)
  [facility]    Patient doing well s/p LAAO. He is hemodynamically stable. Groin site is stable. Plan early ambulation after bedrest completed and hopeful discharge tomorrow morning.   Georgie Chard NP-C Structural Heart Team  Pager: [phone] Phone: [phone]

## 2023-02-20 NOTE — Progress Notes (Signed)
Patient arrived at the unit,CHG bath given,Vitals taken,rt groin incision level 0,no complaints of pain,pt oriented to the unit

## 2023-02-20 NOTE — Transfer of Care (Signed)
Immediate Anesthesia Transfer of Care Note  Patient: Andrew Hardy.  Procedure(s) Performed: LEFT ATRIAL APPENDAGE OCCLUSION TRANSESOPHAGEAL ECHOCARDIOGRAM  Patient Location: Cath Lab  Anesthesia Type:General  Level of Consciousness: awake, alert , and oriented  Airway & Oxygen Therapy: Patient Spontanous Breathing and Patient connected to face mask oxygen  Post-op Assessment: Report given to RN and Post -op Vital signs reviewed and stable  Post vital signs: Reviewed and stable  Last Vitals:  Vitals Value Taken Time  BP 107/64 02/20/23 1418  Temp 97.2   Pulse 84 02/20/23 1419  Resp 12 02/20/23 1419  SpO2 96 % 02/20/23 1419  Vitals shown include unfiled device data.  Last Pain:  Vitals:   02/20/23 1239  TempSrc:   PainSc: 0-No pain         Complications: No notable events documented.

## 2023-02-20 NOTE — Plan of Care (Signed)
  Problem: Education: Goal: Knowledge of cardiac device and self-care will improve Outcome: Progressing Goal: Ability to safely manage health related needs after discharge will improve Outcome: Progressing Goal: Individualized Educational Video(s) Outcome: Progressing   Problem: Cardiac: Goal: Ability to achieve and maintain adequate cardiopulmonary perfusion will improve Outcome: Progressing   Problem: Education: Goal: Knowledge of General Education information will improve Description: Including pain rating scale, medication(s)/side effects and non-pharmacologic comfort measures Outcome: Progressing   Problem: Clinical Measurements: Goal: Ability to maintain clinical measurements within normal limits will improve Outcome: Progressing Goal: Will remain free from infection Outcome: Progressing Goal: Diagnostic test results will improve Outcome: Progressing Goal: Respiratory complications will improve Outcome: Progressing

## 2023-02-20 NOTE — Anesthesia Preprocedure Evaluation (Addendum)
Anesthesia Evaluation  Patient identified by MRN, date of birth, ID band Patient awake    Reviewed: Allergy & Precautions, NPO status , Patient's Chart, lab work & pertinent test results, reviewed documented beta blocker date and time   Airway Mallampati: III  TM Distance: >3 FB Neck ROM: Full    Dental  (+) Teeth Intact, Dental Advisory Given   Pulmonary former smoker   Pulmonary exam normal breath sounds clear to auscultation       Cardiovascular hypertension, Pt. on medications and Pt. on home beta blockers + CAD, + Past MI, + Cardiac Stents and +CHF  + dysrhythmias Atrial Fibrillation and Supra Ventricular Tachycardia  Rhythm:Irregular Rate:Abnormal  Echo 01/29/22: 1. Left ventricular ejection fraction, by estimation, is 50 to 55%. Left  ventricular ejection fraction by 3D volume is 53 %. The left ventricle has  low normal function. The left ventricle has no regional wall motion  abnormalities. Left ventricular  diastolic parameters are indeterminate. The average left ventricular  global longitudinal strain is -12.8 %. The global longitudinal strain is  abnormal.   2. Right ventricular systolic function is normal. The right ventricular  size is normal. There is normal pulmonary artery systolic pressure.   3. Left atrial size was mildly dilated.   4. The mitral valve is degenerative. Mild mitral valve regurgitation. No  evidence of mitral stenosis.   5. The aortic valve is tricuspid. Aortic valve regurgitation is mild.  Aortic valve sclerosis is present, with no evidence of aortic valve  stenosis.   6. Aortic normal DTA.   7. The inferior vena cava is dilated in size with >50% respiratory  variability, suggesting right atrial pressure of 8 mmHg.     Neuro/Psych  PSYCHIATRIC DISORDERS Anxiety Depression     Neuromuscular disease    GI/Hepatic Neg liver ROS,GERD  Medicated,,  Endo/Other  Obesity   Renal/GU Renal  disease     Musculoskeletal  (+) Arthritis ,    Abdominal   Peds  Hematology  (+) Blood dyscrasia (Plavix, Xarelto), anemia   Anesthesia Other Findings Day of surgery medications reviewed with the patient.  Reproductive/Obstetrics                             Anesthesia Physical Anesthesia Plan  ASA: 3  Anesthesia Plan: General   Post-op Pain Management: Tylenol PO (pre-op)*   Induction: Intravenous  PONV Risk Score and Plan: 2 and Dexamethasone and Ondansetron  Airway Management Planned: Oral ETT  Additional Equipment: ClearSight  Intra-op Plan:   Post-operative Plan: Extubation in OR  Informed Consent: I have reviewed the patients History and Physical, chart, labs and discussed the procedure including the risks, benefits and alternatives for the proposed anesthesia with the patient or authorized representative who has indicated his/her understanding and acceptance.     Dental advisory given  Plan Discussed with: CRNA  Anesthesia Plan Comments:         Anesthesia Quick Evaluation

## 2023-02-20 NOTE — Interval H&P Note (Signed)
History and Physical Interval Note:  02/20/2023 11:58 AM  Andrew Hardy.  has presented today for surgery, with the diagnosis of afib.  The various methods of treatment have been discussed with the patient and family. After consideration of risks, benefits and other options for treatment, the patient has consented to  Procedure(s): LEFT ATRIAL APPENDAGE OCCLUSION (N/A) TRANSESOPHAGEAL ECHOCARDIOGRAM (N/A) as a surgical intervention.  The patient's history has been reviewed, patient examined, no change in status, stable for surgery.  I have reviewed the patient's chart and labs.  Questions were answered to the patient's satisfaction.     [name]

## 2023-02-20 NOTE — Anesthesia Procedure Notes (Signed)
Procedure Name: Intubation Date/Time: 02/20/2023 12:47 PM  Performed by: Pincus Large, CRNAPre-anesthesia Checklist: Patient identified, Emergency Drugs available, Suction available and Patient being monitored Patient Re-evaluated:Patient Re-evaluated prior to induction Oxygen Delivery Method: Circle System Utilized Preoxygenation: Pre-oxygenation with 100% oxygen Induction Type: IV induction Ventilation: Mask ventilation without difficulty Laryngoscope Size: Mac and 3 Grade View: Grade II Tube type: Oral Tube size: 7.5 mm Number of attempts: 1 Airway Equipment and Method: Stylet and Oral airway Placement Confirmation: ETT inserted through vocal cords under direct vision, positive ETCO2 and breath sounds checked- equal and bilateral Secured at: 23 cm Tube secured with: Tape Dental Injury: Teeth and Oropharynx as per pre-operative assessment

## 2023-02-21 LAB — BASIC METABOLIC PANEL
Anion gap: 6 (ref 5–15)
BUN: 16 mg/dL (ref 8–23)
CO2: 25 mmol/L (ref 22–32)
Calcium: 8.9 mg/dL (ref 8.9–10.3)
Chloride: 108 mmol/L (ref 98–111)
Creatinine, Ser: 1.05 mg/dL (ref 0.61–1.24)
GFR, Estimated: >60 mL/min (ref >60)
Glucose, Bld: 157 mg/dL — ABNORMAL HIGH (ref 70–99)
Potassium: 4.2 mmol/L (ref 3.5–5.1)
Sodium: 139 mmol/L (ref 135–145)

## 2023-02-21 NOTE — Progress Notes (Signed)
Mobility Specialist Progress Note:   02/21/23 1030  Mobility  Activity Ambulated with assistance in hallway  Level of Assistance Standby assist, set-up cues, supervision of patient - no hands on  Assistive Device None  Distance Ambulated (ft) 470 ft  Activity Response Tolerated well  Mobility Referral Yes  $Mobility charge 1 Mobility  Mobility Specialist Start Time (ACUTE ONLY) 1012  Mobility Specialist Stop Time (ACUTE ONLY) 1020  Mobility Specialist Time Calculation (min) (ACUTE ONLY) 8 min   Pre Mobility: 98 HR  During Mobility: 98-135 HR , 98% SpO2 Post Mobility: 105 HR   Pt received in chair, agreeable to mobility. No hands on needed during ambulation, asx throughout. Pt returned to chair with call bell in reach and all needs met.  Andrew Hardy  Mobility Specialist Please contact via [employer] office at [phone]

## 2023-02-21 NOTE — Progress Notes (Signed)
Explained discharge instructions to patient. Reviewed follow up appointment and next medication administration times. Also reviewed education. Patient verbalized having an understanding for instructions given. All belongings are in the patient's possession. IV and telemetry were removed by floor staff. No other needs verbalized. Transporting downstairs for discharge.

## 2023-02-21 NOTE — Plan of Care (Signed)
  Problem: Education: Goal: Knowledge of cardiac device and self-care will improve Outcome: Adequate for Discharge Goal: Ability to safely manage health related needs after discharge will improve Outcome: Adequate for Discharge Goal: Individualized Educational Video(s) Outcome: Adequate for Discharge   Problem: Cardiac: Goal: Ability to achieve and maintain adequate cardiopulmonary perfusion will improve Outcome: Adequate for Discharge   Problem: Education: Goal: Knowledge of General Education information will improve Description: Including pain rating scale, medication(s)/side effects and non-pharmacologic comfort measures Outcome: Adequate for Discharge   Problem: Health Behavior/Discharge Planning: Goal: Ability to manage health-related needs will improve Outcome: Adequate for Discharge   Problem: Clinical Measurements: Goal: Ability to maintain clinical measurements within normal limits will improve Outcome: Adequate for Discharge Goal: Will remain free from infection Outcome: Adequate for Discharge Goal: Diagnostic test results will improve Outcome: Adequate for Discharge Goal: Respiratory complications will improve Outcome: Adequate for Discharge Goal: Cardiovascular complication will be avoided Outcome: Adequate for Discharge   Problem: Activity: Goal: Risk for activity intolerance will decrease Outcome: Adequate for Discharge   Problem: Nutrition: Goal: Adequate nutrition will be maintained Outcome: Adequate for Discharge   Problem: Coping: Goal: Level of anxiety will decrease Outcome: Adequate for Discharge   Problem: Elimination: Goal: Will not experience complications related to bowel motility Outcome: Adequate for Discharge Goal: Will not experience complications related to urinary retention Outcome: Adequate for Discharge   Problem: Pain Management: Goal: General experience of comfort will improve Outcome: Adequate for Discharge   Problem:  Safety: Goal: Ability to remain free from injury will improve Outcome: Adequate for Discharge   Problem: Skin Integrity: Goal: Risk for impaired skin integrity will decrease Outcome: Adequate for Discharge

## 2023-02-21 NOTE — Plan of Care (Signed)
  Problem: Education: Goal: Knowledge of cardiac device and self-care will improve Outcome: Progressing Goal: Ability to safely manage health related needs after discharge will improve Outcome: Progressing Goal: Individualized Educational Video(s) Outcome: Progressing   Problem: Cardiac: Goal: Ability to achieve and maintain adequate cardiopulmonary perfusion will improve Outcome: Progressing   Problem: Education: Goal: Knowledge of General Education information will improve Description: Including pain rating scale, medication(s)/side effects and non-pharmacologic comfort measures Outcome: Progressing   Problem: Health Behavior/Discharge Planning: Goal: Ability to manage health-related needs will improve Outcome: Progressing   Problem: Clinical Measurements: Goal: Ability to maintain clinical measurements within normal limits will improve Outcome: Progressing Goal: Will remain free from infection Outcome: Progressing Goal: Diagnostic test results will improve Outcome: Progressing Goal: Respiratory complications will improve Outcome: Progressing Goal: Cardiovascular complication will be avoided Outcome: Progressing   Problem: Activity: Goal: Risk for activity intolerance will decrease Outcome: Progressing   Problem: Nutrition: Goal: Adequate nutrition will be maintained Outcome: Progressing   Problem: Elimination: Goal: Will not experience complications related to bowel motility Outcome: Progressing Goal: Will not experience complications related to urinary retention Outcome: Progressing   Problem: Pain Management: Goal: General experience of comfort will improve Outcome: Progressing   Problem: Safety: Goal: Ability to remain free from injury will improve Outcome: Progressing   Problem: Skin Integrity: Goal: Risk for impaired skin integrity will decrease Outcome: Progressing

## 2023-02-24 ENCOUNTER — Other Ambulatory Visit: Payer: Self-pay

## 2023-02-24 ENCOUNTER — Telehealth: Payer: Self-pay

## 2023-02-24 DIAGNOSIS — I4821 Permanent atrial fibrillation: Secondary | ICD-10-CM

## 2023-02-24 DIAGNOSIS — Z95818 Presence of other cardiac implants and grafts: Secondary | ICD-10-CM

## 2023-02-24 DIAGNOSIS — K922 Gastrointestinal hemorrhage, unspecified: Secondary | ICD-10-CM

## 2023-02-24 NOTE — Telephone Encounter (Signed)
  [facility]   Watchman Team  Contacted the patient regarding discharge from [facility] on 02/21/2023  The patient understands to follow up with Georgie Chard, NP on 03/31/2023 in preparation for TEE on 04/18/2023.  The patient understands discharge instructions? Yes  The patient understands medications and regimen? Yes   The patient reports groin site has improving bruising but no S/S of infection or active bleeding.  The patient understands to call with any questions or concerns prior to scheduled visit.

## 2023-02-24 NOTE — Anesthesia Postprocedure Evaluation (Signed)
Anesthesia Post Note  Patient: Andrew Hardy.  Procedure(s) Performed: LEFT ATRIAL APPENDAGE OCCLUSION TRANSESOPHAGEAL ECHOCARDIOGRAM     Patient location during evaluation: PACU Anesthesia Type: General Level of consciousness: awake and alert Pain management: pain level controlled Vital Signs Assessment: post-procedure vital signs reviewed and stable Respiratory status: spontaneous breathing, nonlabored ventilation, respiratory function stable and patient connected to nasal cannula oxygen Cardiovascular status: blood pressure returned to baseline and stable Postop Assessment: no apparent nausea or vomiting Anesthetic complications: no   No notable events documented.  Last Vitals:  Vitals:   02/21/23 0400 02/21/23 0904  BP: 110/74 130/80  Pulse: 94 (!) 106  Resp: 20 14  Temp: 36.9 C 36.8 C  SpO2: 95% 98%    Last Pain:  Vitals:   02/21/23 0904  TempSrc: Oral  PainSc: 0-No pain                 Malic Lites P Jessicalynn Deshong

## 2023-02-28 ENCOUNTER — Ambulatory Visit
Admission: RE | Admit: 2023-02-28 | Discharge: 2023-02-28 | Disposition: A | Discharge status: Home / Self Care | Payer: PPO | Source: Ambulatory Visit | Attending: Orthopedic Surgery | Admitting: Orthopedic Surgery

## 2023-02-28 DIAGNOSIS — M25512 Pain in left shoulder: Secondary | ICD-10-CM | POA: Diagnosis not present

## 2023-02-28 DIAGNOSIS — Z96612 Presence of left artificial shoulder joint: Secondary | ICD-10-CM | POA: Diagnosis not present

## 2023-03-18 ENCOUNTER — Other Ambulatory Visit: Payer: Self-pay | Admitting: Cardiology

## 2023-03-28 NOTE — H&P (View-Only) (Signed)
[facility]                                     Cardiology Office Note:    Date:  03/31/2023   ID:  Andrew Posey., DOB [birth year], MRN [id number]  PCP:  Lonie Peak, PA-C  [facility] Cardiologist:  Norman Herrlich, MD  [facility] Electrophysiologist:  Will Jorja Loa, MD   Referring MD: Lonie Peak, PA-C   Chief Complaint  Patient presents with   Follow-up   one month s/p LAAO    History of Present Illness:    Andrew Unangst. is a 82 y.o. male with a hx of obesity, HTN, HLD, CAD s/p PCI to LCx (07/2021), CKD stage IIIb, RBBB, HFpEF, GI bleeding and permanent atrial fibrillation s/p Watchman who is here for one month follow up.    At the most recent appointment with Dr. Dulce Sellar, the patient was thought to be extremely high risk for bleeding given a prior history of GI bleed and now the use of antiplatelets for CAD in addition to anticoagulation. He was was referred to Dr. Lalla Brothers for consideration of LAAO with Watchman. Given abnormal renal function pre Watchman CT was deferred. He is now s/p successful LAAO with Watchman 24mm FLX Pro device on 02/20/2023. He was restarted on home Xarelto 20mg  daily and Plavix 75mg  daily with no issues with plans to continue this regimen through 45 days (12/15) then stop Xarelto and continue Plavix through at least 6 months therapy. He is greater than one year out from PCI therefore will likely continue ASA monotherapy however will clarify with primary cardiologist if preference would be to continue Plavix only. He will require dental SBE for 6 months as well. Amoxicillin was sent to preferred pharmacy. Post implant TEE will be performed to ensure proper seal of the device.   Today he is here with his wife and reports that he has been well from a Watchman standpoint however does report worsening DOE. Activities which were previously easy for him have now become more difficult due to shortness of breath. He needs to stop to rest  after short durations for several minutes then can resume. He has a significant hx of CAD with last cath 07/2021 at which time he had two culprit lesions mid OM1 and LCx/OM branch which were both stented. He reports his typical anginal equivalent as DOE or SOB and current symptoms being similar to that in the past. Otherwise, he denies chest pain, palpitations, LE edema, orthopnea, PND, dizziness, or syncope. Denies bleeding in stool or urine.      Past Medical History:  Diagnosis Date   Anxiety    Arthritis    Atypical atrial flutter (HCC) 12/14/2014   Bradycardia, sinus 10/15/2014   Burning sensation of feet 01/17/2016   CAD, multiple vessel 10/18/2014   Overview:  DES to [facility] for unstable angina 10/17/14 Diagnostic Summary Severe stenosis of mid Cx likely cause of recent accelerating angina Diffuse severe stenosis of small caliber LAD unchanged from 2012 Normal LV function Paroxysmal SVT noted during procedure 04/23/16:  PCI / Resolute Drug Eluting Stent of the proximal Left Anterior Descending Coronary Artery. Successful PTCA of the ostial 1st Diagonal Coronary Artery   Chronic left-sided low back pain with sciatica 02/21/2016   Coronary artery disease 2003   s/p MI and numerous stents   Coronary artery disease involving native coronary  artery of native heart with angina pectoris (HCC) 10/18/2014   Overview:  DES to [facility] for unstable angina 10/17/14 Diagnostic Summary Severe stenosis of mid Cx likely cause of recent accelerating angina Diffuse severe stenosis of small caliber LAD unchanged from 2012 Normal LV function Paroxysmal SVT noted during procedure 04/23/16:  PCI / Resolute Drug Eluting Stent of the proximal Left Anterior Descending Coronary Artery. 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   Degenerative spondylolisthesis 05/02/2017   Depression    Dyspnea    WITH EXERTION   Enlarged prostate without lower urinary tract symptoms (luts)  2012   frequent urination and nocturia; sees dr Oda Cogan in [place]   Essential hypertension 10/15/2014   Gastroesophageal reflux disease without esophagitis 04/21/2016   GERD (gastroesophageal reflux disease)    Hyperlipidemia    Hypertension    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   IHD (ischemic heart disease) 10/15/2014   Lower limb pain, inferior, right 12/17/2017   Lumbar radiculopathy 02/21/2016   Myocardial infarction [facility]) 2003   NSTEMI (non-ST elevated myocardial infarction) (HCC) 04/24/2016   Overview:  LHC 04/24/15: Severe stenosis of the LAD & Diagonal bifurcation  LV ejection fraction is 55-60 %  Interventional Summary Successful Angiosculpt PCI / Resolute Drug Eluting Stent of the proximal LAD Successful PTCA of the ostial 1st Diagonal Coronary Artery   Paroxysmal SVT (supraventricular tachycardia) (HCC) 11/16/2014   Precordial pain 10/15/2014   Presence of Watchman left atrial appendage closure device 02/20/2023   24mm Watchman FLX Pro with Dr. 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 ENDOSCOPY;  Service: Gastroenterology;;   CARDIAC CATHETERIZATION  10-2010   ptca/stent in [place]   CARDIAC CATHETERIZATION  04/23/2016   COLONOSCOPY WITH PROPOFOL N/A 04/24/2020   Procedure: COLONOSCOPY WITH PROPOFOL;  Surgeon: Beverley Fiedler, MD;  Location: [facility] ENDOSCOPY;  Service: Gastroenterology;  Laterality: N/A;    CORONARY ANGIOPLASTY     CORONARY STENT INTERVENTION N/A 08/10/2021   Procedure: CORONARY STENT INTERVENTION;  Surgeon: Marykay Lex, MD;  Location: [facility] INVASIVE CV LAB;  Service: Cardiovascular;  Laterality: N/A;   ESOPHAGOGASTRODUODENOSCOPY (EGD) WITH PROPOFOL N/A 04/24/2020   Procedure: ESOPHAGOGASTRODUODENOSCOPY (EGD) WITH PROPOFOL;  Surgeon: Beverley Fiedler, MD;  Location: [facility] ENDOSCOPY;  Service: Gastroenterology;  Laterality: N/A;   EYE SURGERY  05-1010   bil cataract ,iol   FRACTURE SURGERY     HEMOSTASIS CONTROL  04/24/2020   Procedure: HEMOSTASIS CONTROL;  Surgeon: Beverley Fiedler, MD;  Location: [facility] ENDOSCOPY;  Service: Gastroenterology;;   I & D EXTREMITY Right 02/23/2015   Procedure: RIGHT RING FINGER REVISION AMPUTATION;  Surgeon: Betha Loa, MD;  Location: [facility] OR;  Service: Orthopedics;  Laterality: Right;   KNEE ARTHROSCOPY W/ MENISCAL REPAIR  2000   left knee   LEFT ATRIAL APPENDAGE OCCLUSION N/A 02/20/2023   Procedure: LEFT ATRIAL APPENDAGE OCCLUSION;  Surgeon: Steffanie Dunn  T, MD;  Location: [facility] INVASIVE CV LAB;  Service: Cardiovascular;  Laterality: N/A;   LEFT HEART CATH AND CORONARY ANGIOGRAPHY N/A 08/10/2021   Procedure: LEFT HEART CATH AND CORONARY ANGIOGRAPHY;  Surgeon: Marykay Lex, MD;  Location: [facility] INVASIVE CV LAB;  Service: Cardiovascular;  Laterality: N/A;   LUMBAR EPIDURAL INJECTION  2011   low back pain; DDD   POLYPECTOMY  04/24/2020   Procedure: POLYPECTOMY;  Surgeon: Beverley Fiedler, MD;  Location: [facility] ENDOSCOPY;  Service: Gastroenterology;;   REVERSE SHOULDER ARTHROPLASTY Left 11/23/2021   Procedure: REVERSE SHOULDER ARTHROPLASTY;  Surgeon: Beverely Low, MD;  Location: [facility] ORS;  Service: Orthopedics;  Laterality: Left;   ring finger tramatic qamputation Right    SHOULDER ARTHROSCOPY  03/29/2011   Procedure: ARTHROSCOPY SHOULDER;  Surgeon: Verlee Rossetti;  Location: [facility] OR;  Service: Orthopedics;  Laterality: Right;  RIGHT SHOULDER ARTHROSCOPY WITH OPEN SUBSCAPULAR  REPAIR   SHOULDER OPEN ROTATOR CUFF REPAIR     3 on rt shoulder; 2 on left   TRANSESOPHAGEAL ECHOCARDIOGRAM (CATH LAB) N/A 02/20/2023   Procedure: TRANSESOPHAGEAL ECHOCARDIOGRAM;  Surgeon: Lanier Prude, MD;  Location: [facility] INVASIVE CV LAB;  Service: Cardiovascular;  Laterality: N/A;   TRANSFORAMINAL LUMBAR INTERBODY FUSION (TLIF) WITH PEDICLE SCREW FIXATION 1 LEVEL N/A 04/24/2017   Procedure: TRANSFORAMINAL LUMBAR INTERBODY FUSION (TLIF) L4-5;  Surgeon: Venita Lick, MD;  Location: [facility] OR;  Service: Orthopedics;  Laterality: N/A;  4 hrs    Current Medications: Current Meds  Medication Sig   acetaminophen (TYLENOL) 325 MG tablet Take 2 tablets (650 mg total) by mouth every 6 (six) hours as needed (pain).   Ascorbic Acid (VITAMIN C) 1000 MG tablet Take 1,000 mg by mouth daily.   clopidogrel (PLAVIX) 75 MG tablet Take 1 tablet (75 mg total) by mouth daily.   CO-ENZYME Q-10 PO Take 50 mg by mouth daily.   Cyanocobalamin (B-12 PO) Take 1 tablet by mouth daily.   diltiazem (CARDIZEM CD) 120 MG 24 hr capsule Take 120 mg by mouth daily.   DULoxetine (CYMBALTA) 30 MG capsule Take 30 mg by mouth 2 (two) times daily.   ezetimibe (ZETIA) 10 MG tablet Take 1 tablet (10 mg total) by mouth daily.   finasteride (PROSCAR) 5 MG tablet Take 5 mg by mouth daily.   Flaxseed, Linseed, (FLAXSEED OIL MAX STR) 1300 MG CAPS Take 1,300 mg by mouth daily.   furosemide (LASIX) 80 MG tablet Take 1 tablet (80 mg total) by mouth in the morning. (Patient taking differently: Take 40-80 mg by mouth See admin instructions. 80 mg in the morning, 40 mg in the evening)   gemfibrozil (LOPID) 600 MG tablet TAKE 1/2 TABLET(300 MG) BY MOUTH TWICE DAILY BEFORE A MEAL   Glucosamine HCl (GLUCOSAMINE PO) Take 1 tablet by mouth 2 (two) times daily.   isosorbide mononitrate (IMDUR) 60 MG 24 hr tablet Take 1 tablet (60 mg total) by mouth daily.   metoprolol succinate (TOPROL-XL) 25 MG 24 hr tablet Take 1 tablet (25 mg total) by mouth 3  (three) times daily.   Multiple Vitamins-Minerals (MULTIVITAMINS THER. W/MINERALS) TABS Take 1 tablet by mouth daily.   nitroGLYCERIN (NITROSTAT) 0.4 MG SL tablet PLACE 1 TABLET UNDER THE TONGUE EVERY 5 MINUTES AS NEEDED FOR CHEST PAIN   Omega-3 Fatty Acids (FISH OIL PO) Take 1 tablet by mouth daily.   pantoprazole (PROTONIX) 40 MG tablet Take 1 tablet (40 mg total) by mouth daily.   potassium chloride SA (KLOR-CON M) 20 MEQ  tablet TAKE 2 TABLETS BY MOUTH DAILY THEN TAKE 1 TABLET BY MOUTH AT NIGHT   rosuvastatin (CRESTOR) 10 MG tablet Take 10 mg by mouth daily.   tamsulosin (FLOMAX) 0.4 MG CAPS capsule Take 0.4 mg by mouth daily.   traZODone (DESYREL) 100 MG tablet Take 100 mg by mouth at bedtime.   TURMERIC CURCUMIN PO Take 1 capsule by mouth daily.   [DISCONTINUED] XARELTO 20 MG TABS tablet TAKE 1 TABLET(20 MG) BY MOUTH DAILY WITH SUPPER   amoxicillin (AMOXIL) 500 MG tablet TAKE 4 TABS PRIOR TO DENTAL PROCEDURES ONE HOUR PRIOR TO DENTAL PROCEDURES     Allergies:   Amiodarone, Buprenorphine hcl, Codeine, Hydrocodone, Percocet [oxycodone-acetaminophen], and Sotalol hcl   Social History   Socioeconomic History   Marital status: Married    Spouse name: Not on file   Number of children: Not on file   Years of education: Not on file   Highest education level: Not on file  Occupational History   Not on file  Tobacco Use   Smoking status: Former    Current packs/day: 0.00    Average packs/day: 1 pack/day for 20.0 years (20.0 ttl pk-yrs)    Types: Cigarettes    Start date: 03/20/1974    Quit date: 03/20/1994    Years since quitting: 29.0   Smokeless tobacco: Former    Types: Chew    Quit date: 03/20/1996  Vaping Use   Vaping status: Never Used  Substance and Sexual Activity   Alcohol use: Yes    Alcohol/week: 6.0 standard drinks of alcohol    Types: 6 Cans of beer per week    Comment: BEER- DAILY   Drug use: No   Sexual activity: Not on file  Other Topics Concern   Not on file   Social History Narrative   Not on file   Social Determinants of Health   Financial Resource Strain: Not on file  Food Insecurity: Not on file  Transportation Needs: Not on file  Physical Activity: Not on file  Stress: Not on file  Social Connections: Not on file     Family History: The patient's family history includes Cancer in his father and sister; Heart disease in his father and mother.  ROS:   Please see the history of present illness.    All other systems reviewed and are negative.  EKGs/Labs/Other Studies Reviewed:    The following studies were reviewed today:   Cardiac Studies & Procedures   CARDIAC CATHETERIZATION  CARDIAC CATHETERIZATION 08/10/2021  Narrative   Prox RCA to Mid RCA lesion is 30% stenosed.   Previously placed Prox LAD to Mid LAD stent (unknown type) is  widely patent.  Jailed 1st Diag lesion is 60% stenosed.   Previously placed Prox Cx to Dist Cx stent (unknown type) is  widely patent.   Lesion #1: Dist Cx lesion is 90% stenosed.   A drug-eluting stent was successfully placed using a STENT ONYX FRONTIER 2.25X12.  Deployed at 2.35 mm and postdilated to 2.45 mm in the overlap segment.   Post intervention, there is a 0% residual stenosis.   Lesion #2: 1st Mrg lesion is 80% stenosed with 95% stenosed side branch in Lat 1st Mrg.   Mid LAD lesion is 55% stenosed.   Dist LAD lesion is 50% stenosed.   A drug-eluting stent was successfully placed using a STENT ONYX FRONTIER 2.25X1 -> deployed to 2.45 mm.   Post intervention, there is a 0% residual stenosis.  Post intervention, the  side branch was reduced to 100% residual stenosis.  (Patient did have chest pain with stent placement, resolved with sublingual nitroglycerin and fentanyl/Versed.)  Multivessel CAD with 2 culprit lesions: Mid OM1 (85%) at small branch and distal LCx-OM2 (90%) just distal to prior stent.  Mild ISR in RCA stent and ostial diagonal (jailed) 60%.  Tandem 50 to 60% lesions in the mid  LAD that are in hinge points.  Not likely flow-limiting. Successful two-vessel PCI of 2 culprit lesions reducing to 0% stenosis each treated with Onyx frontier DES 2.25 mm 12 mm stents dilated to 2.45 mm. Preserved LVEF with low normal roughly 50 to 55%.  Inadequate filling-therefore unable to truly estimate EF and wall motion.  Findings Coronary Findings Diagnostic  Dominance: Right  Left Anterior Descending Vessel is moderate in size. Previously placed Prox LAD to Mid LAD stent (unknown type) is  widely patent. Mid LAD lesion is 55% stenosed. The lesion is located at the bend. Dist LAD lesion is 50% stenosed. The lesion is located at the bend.  First Diagonal Branch Vessel is small in size. 1st Diag lesion is 60% stenosed. The lesion was previously treated using angioplasty . Rescue PTCA after stenting across  Second Diagonal Branch Vessel is small in size.  Third Diagonal Branch Vessel is small in size.  Left Circumflex Vessel is small. Previously placed Prox Cx to Dist Cx stent (unknown type) is  widely patent. Dist Cx lesion is 90% stenosed. The lesion is distal to major branch, focal, concentric and irregular. Distal to the previous stent  First Obtuse Marginal Branch Vessel is small in size. 1st Mrg lesion is 80% stenosed with 95% stenosed side branch in Lat 1st Mrg. The lesion is eccentric and ulcerative. At very small sidebranch The lesion was not previously treated .  Left Posterior Atrioventricular Artery Vessel is small in size.  Right Coronary Artery Prox RCA to Mid RCA lesion is 30% stenosed. The lesion was previously treated .  Intervention  Prox Cx to Dist Cx lesion Stent (Also treats lesions: Dist Cx) Lesion length:  8 mm. CATH VISTA GUIDE 6FR XB3.5 guide catheter was inserted. Lesion crossed with guidewire using a WIRE ASAHI PROWATER 180CM. Pre-stent angioplasty was performed using a BALLN SAPPHIRE 2.25X10. Maximum pressure:  10 atm. Inflation time: 20  sec. A drug-eluting stent was successfully placed using a STENT ONYX FRONTIER 2.25X12. Maximum pressure: 14 atm. Inflation time: 30 sec. Stent strut is well apposed. Stent balloon pulled back to the overlapping segment and inflated to high ATM Deployed to 2.35 mm, but that the overlap site dilated to 2.45 mm. Post-stent angioplasty was not performed. Stent balloon at high ATM for overlap-18 ATM Downstream lesion covered with a overlapping stent, overlapping segment postdilated with stent balloon to high ATM. Post-Intervention Lesion Assessment The intervention was successful. Pre-interventional TIMI flow is 3. Post-intervention TIMI flow is 3. There is a 0% residual stenosis post intervention.  Dist Cx lesion Stent (Also treats lesions: Prox Cx to Dist Cx) See details in Prox Cx to Dist Cx lesion. Post-Intervention Lesion Assessment The intervention was successful. Pre-interventional TIMI flow is 3. Post-intervention TIMI flow is 3. Treated lesion length:  10 mm. No complications occurred at this lesion. There is a 0% residual stenosis post intervention.  1st Mrg lesion with side branch in Lat 1st Mrg Stent - Main Branch Lesion length:  10 mm. CATH VISTA GUIDE 6FR XB3.5 guide catheter was inserted. Lesion crossed with guidewire using a WIRE ASAHI PROWATER 180CM. Pre-stent  angioplasty was performed using a BALLN SAPPHIRE 2.25X10. Maximum pressure:  10 atm. Inflation time:  20 sec. A drug-eluting stent was successfully placed using a STENT ONYX FRONTIER 2.25X12. Maximum pressure: 16 atm. Inflation time: 30 sec. Stent strut is well apposed. Deployed to 2.45 mm Post-stent angioplasty was not performed. Same Prowater wire was read directed after PCI of the distal LCx into the OM1 Post-Intervention Lesion Assessment The intervention was successful. Pre-interventional TIMI flow is 3. Post-intervention TIMI flow is 3. Treated lesion length:  12 mm. Very small sidebranch occluded =&gt; 4 out of 10 chest pain,  resolved with sublingual nitroglycerin and second round of fentanyl and Versed. There is a 0% residual stenosis in the main branch post intervention. There is a 100% residual stenosis in the side branch post intervention.   STRESS TESTS  MYOCARDIAL PERFUSION IMAGING 03/25/2019  Narrative  There was no ST segment deviation noted during stress.  No T wave inversion was noted during stress.  Nongated study due to atrial fibrillation.  The study is normal.  This is a low risk study.   ECHOCARDIOGRAM  ECHOCARDIOGRAM COMPLETE 01/29/2022  Narrative ECHOCARDIOGRAM REPORT    Patient Name:   Andrew Heinig. Date of Exam: 01/29/2022 Medical Rec #:  [id number]       Height:       66.0 in Accession #:    [id number]      Weight:       204.8 lb Date of Birth:  [birth year]       BSA:          2.020 m Patient Age:    38 years        BP:           94/60 mmHg Patient Gender: M               HR:           92 bpm. Exam Location:  [place]  Procedure: 2D Echo, 3D Echo, Cardiac Doppler, Color Doppler and Strain Analysis  Indications:    Coronary artery disease involving native coronary artery of native heart with angina pectoris (HCC) [I25.119 (ICD-10-CM)]; Persistent atrial fibrillation (HCC) [I48.19 (ICD-10-CM)]; Chronic anticoagulation [Z79.01 (ICD-10-CM)]; High risk medication use [Z79.899 (ICD-10-CM)]; Hypertensive heart disease with heart failure (HCC) [I11.0 (ICD-10-CM)]; Mixed hyperlipidemia [E78.2 (ICD-10-CM)]; SOB (shortness of breath) on exertion [R06.02 (ICD-10-CM)]  History:        Patient has prior history of Echocardiogram examinations, most recent 05/04/2019. CAD and Previous Myocardial Infarction, Arrythmias:Atrial Fibrillation, SVT and PVC; Risk Factors:Dyslipidemia and Hypertension.  Sonographer:    Margreta Journey RDCS Referring Phys: [id number] BRIAN J MUNLEY  IMPRESSIONS   1. Left ventricular ejection fraction, by estimation, is 50 to 55%. Left ventricular ejection  fraction by 3D volume is 53 %. The left ventricle has low normal function. The left ventricle has no regional wall motion abnormalities. Left ventricular diastolic parameters are indeterminate. The average left ventricular global longitudinal strain is -12.8 %. The global longitudinal strain is abnormal. 2. Right ventricular systolic function is normal. The right ventricular size is normal. There is normal pulmonary artery systolic pressure. 3. Left atrial size was mildly dilated. 4. The mitral valve is degenerative. Mild mitral valve regurgitation. No evidence of mitral stenosis. 5. The aortic valve is tricuspid. Aortic valve regurgitation is mild. Aortic valve sclerosis is present, with no evidence of aortic valve stenosis. 6. Aortic normal DTA. 7. The inferior vena cava is dilated in size with >50% respiratory variability,  suggesting right atrial pressure of 8 mmHg.  FINDINGS Left Ventricle: Left ventricular ejection fraction, by estimation, is 50 to 55%. Left ventricular ejection fraction by 3D volume is 53 %. The left ventricle has low normal function. The left ventricle has no regional wall motion abnormalities. The average left ventricular global longitudinal strain is -12.8 %. The global longitudinal strain is abnormal. The left ventricular internal cavity size was normal in size. There is no left ventricular hypertrophy. Left ventricular diastolic parameters are indeterminate.  Right Ventricle: The right ventricular size is normal. No increase in right ventricular wall thickness. Right ventricular systolic function is normal. There is normal pulmonary artery systolic pressure. The tricuspid regurgitant velocity is 2.47 m/s, and with an assumed right atrial pressure of 8 mmHg, the estimated right ventricular systolic pressure is 32.4 mmHg.  Left Atrium: Left atrial size was mildly dilated.  Right Atrium: Right atrial size was normal in size.  Pericardium: There is no evidence of  pericardial effusion.  Mitral Valve: The mitral valve is degenerative in appearance. Mild mitral annular calcification. Mild mitral valve regurgitation. No evidence of mitral valve stenosis.  Tricuspid Valve: The tricuspid valve is normal in structure. Tricuspid valve regurgitation is mild . No evidence of tricuspid stenosis.  Aortic Valve: The aortic valve is tricuspid. Aortic valve regurgitation is mild. Aortic regurgitation PHT measures 724 msec. Aortic valve sclerosis is present, with no evidence of aortic valve stenosis.  Pulmonic Valve: The pulmonic valve was normal in structure. Pulmonic valve regurgitation is not visualized. No evidence of pulmonic stenosis.  Aorta: The aortic root and ascending aorta are structurally normal, with no evidence of dilitation, normal DTA and the aortic arch was not well visualized.  Venous: A systolic blunting flow pattern is recorded from the right upper pulmonary vein. The inferior vena cava is dilated in size with greater than 50% respiratory variability, suggesting right atrial pressure of 8 mmHg.  IAS/Shunts: No atrial level shunt detected by color flow Doppler.   LEFT VENTRICLE PLAX 2D LVIDd:         4.20 cm         Diastology LVIDs:         3.00 cm         LV e' medial:    8.38 cm/s LV PW:         1.10 cm         LV E/e' medial:  11.9 LV IVS:        1.20 cm         LV e' lateral:   12.60 cm/s LVOT diam:     2.00 cm         LV E/e' lateral: 7.9 LV SV:         48 LV SV Index:   24              2D LVOT Area:     3.14 cm        Longitudinal Strain 2D Strain GLS  -12.8 % LV Volumes (MOD)               Avg: LV vol d, MOD    74.4 ml A2C:                           3D Volume EF LV vol d, MOD    109.0 ml      LV 3D EF:    Left A4C:  ventricul LV vol s, MOD    33.3 ml                    ar A2C:                                        ejection LV vol s, MOD    50.0 ml                    fraction A4C:                                         by 3D LV SV MOD A2C:   41.1 ml                    volume is LV SV MOD A4C:   109.0 ml                   53 %. LV SV MOD BP:    52.7 ml  3D Volume EF: 3D EF:        53 % LV EDV:       118 ml LV ESV:       56 ml LV SV:        62 ml  RIGHT VENTRICLE             IVC RV Basal diam:  3.00 cm     IVC diam: 2.50 cm RV S prime:     13.80 cm/s TAPSE (M-mode): 1.6 cm  LEFT ATRIUM             Index        RIGHT ATRIUM           Index LA diam:        4.10 cm 2.03 cm/m   RA Area:     17.30 cm LA Vol (A2C):   75.8 ml 37.52 ml/m  RA Volume:   37.00 ml  18.32 ml/m LA Vol (A4C):   63.7 ml 31.53 ml/m LA Biplane Vol: 70.6 ml 34.95 ml/m AORTIC VALVE LVOT Vmax:   87.70 cm/s LVOT Vmean:  62.500 cm/s LVOT VTI:    0.152 m AI PHT:      724 msec  AORTA Ao Root diam: 3.10 cm Ao Asc diam:  3.40 cm Ao Desc diam: 2.30 cm  MITRAL VALVE               TRICUSPID VALVE MV Area (PHT): 3.53 cm    TR Peak grad:   24.4 mmHg MV Decel Time: 215 msec    TR Vmax:        247.00 cm/s MR Peak grad: 50.7 mmHg MR Vmax:      356.00 cm/s  SHUNTS MV E velocity: 99.80 cm/s  Systemic VTI:  0.15 m Systemic Diam: 2.00 cm  Norman Herrlich MD Electronically signed by Norman Herrlich MD Signature Date/Time: 01/29/2022/12:40:53 PM    Final   TEE  ECHO TEE 02/20/2023  Narrative TRANSESOPHOGEAL ECHO REPORT    Patient Name:   Andrew Hardy. Date of Exam: 02/20/2023 Medical Rec #:  [id number]       Height:       67.0 in Accession #:    [id number]      Weight:  214.8 lb Date of Birth:  04/04/1941       BSA:          2.085 m Patient Age:    82 years        BP:           118/76 mmHg Patient Gender: M               HR:           90 bpm. Exam Location:  Inpatient  Procedure: Transesophageal Echo, Color Doppler and 3D Echo  Indications:     Watchman  History:         Patient has prior history of Echocardiogram examinations, most recent 01/29/2022. CAD, Arrythmias:Atrial Flutter;  Risk Factors:Hypertension.  Sonographer:     Darlys Gales Referring Phys:  [id number] Lanier Prude Diagnosing Phys: Thurmon Fair MD  PROCEDURE: After discussion of the risks and benefits of a TEE, an informed consent was obtained. The transesophogeal probe was passed without difficulty through the esophogus of the patient. Sedation performed by different physician. The patient developed no complications during the procedure.  Transesophageal echocardiography, including live and postprocessed 3D imaging was used to assist with device selection, transseptal puncture, device deployment, monitoring for complications and assessment of the final result.  IMPRESSIONS   1. Left ventricular ejection fraction, by estimation, is 55 to 60%. The left ventricle has normal function. The left ventricle has no regional wall motion abnormalities. Left ventricular diastolic function could not be evaluated. 2. Right ventricular systolic function is normal. The right ventricular size is normal. Tricuspid regurgitation signal is inadequate for assessing PA pressure. The estimated right ventricular systolic pressure is 23.3 mmHg. 3. Left atrial size was severely dilated. No left atrial/left atrial appendage thrombus was detected. 4. Evidence of atrial level shunting detected by color flow Doppler. 5. Right atrial size was mildly dilated. 6. The mitral valve is normal in structure. Trivial mitral valve regurgitation. 7. The aortic valve is tricuspid. There is mild calcification of the aortic valve. Aortic valve regurgitation is mild. Aortic valve sclerosis is present, with no evidence of aortic valve stenosis. 8. There is mild (Grade II) layered plaque.  FINDINGS Left Ventricle: Left ventricular ejection fraction, by estimation, is 55 to 60%. The left ventricle has normal function. The left ventricle has no regional wall motion abnormalities. The left ventricular internal cavity size was normal in size. There  is no left ventricular hypertrophy. Left ventricular diastolic function could not be evaluated due to atrial fibrillation. Left ventricular diastolic function could not be evaluated.  Right Ventricle: The right ventricular size is normal. No increase in right ventricular wall thickness. Right ventricular systolic function is normal. Tricuspid regurgitation signal is inadequate for assessing PA pressure. The tricuspid regurgitant velocity is 2.14 m/s, and with an assumed right atrial pressure of 5 mmHg, the estimated right ventricular systolic pressure is 23.3 mmHg.  Left Atrium: Left atrial size was severely dilated. No left atrial/left atrial appendage thrombus was detected.  Right Atrium: Right atrial size was mildly dilated.  Pericardium: Trivial pericardial effusion is present.  Mitral Valve: The mitral valve is normal in structure. Trivial mitral valve regurgitation.  Tricuspid Valve: The tricuspid valve is normal in structure. Tricuspid valve regurgitation is mild.  Aortic Valve: The aortic valve is tricuspid. There is mild calcification of the aortic valve. Aortic valve regurgitation is mild. Aortic valve sclerosis is present, with no evidence of aortic valve stenosis.  Pulmonic Valve: The pulmonic valve was normal in  structure. Pulmonic valve regurgitation is trivial. No evidence of pulmonic stenosis.  Aorta: The aortic root, ascending aorta, aortic arch and descending aorta are all structurally normal, with no evidence of dilitation or obstruction. There is mild (Grade II) layered plaque.  IAS/Shunts: The interatrial septum appears to be lipomatous. Evidence of atrial level shunting detected by color flow Doppler. There is no evidence of interatrial shunting at the beginning of the procedure. 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   TRICUSPID VALVE TR Peak grad:   18.3 mmHg TR Vmax:        214.00  cm/s  Mihai Croitoru MD Electronically signed by Thurmon Fair MD Signature Date/Time: 02/20/2023/2:55:20 PM    Final   MONITORS  LONG TERM MONITOR (3-14 DAYS) 01/29/2022  Narrative Patch Wear Time:  7 days and 10 hours (2023-09-25T08:59:54-0400 to 2023-10-02T19:21:24-0400)  Atrial Fibrillation occurred continuously (100% burden), ranging from 57-177 bpm (avg of 96 bpm). Bundle Branch Block/IVCD was present.  There were no pauses of 3 seconds or greater  Isolated VEs were rare (<1.0%), VE Couplets were rare (<1.0%), and no VE Triplets were present. Ventricular Bigeminy was present.  Heart rate was greater than 110 bpm 26% of the time daytime 2% of the time nighttime.  Her 48 triggered events all associated with atrial fibrillation generally rapid rates greater than 110 bpm and 1 episode with ventricular bigeminy.           EKG:  EKG is ordered today.  The ekg ordered today demonstrates AF with RBBB and elevated rates at 119bpm.   Recent Labs: 08/13/2022: ALT 10 02/19/2023: Hemoglobin 12.7; Platelets 208 02/21/2023: BUN 16; Creatinine, Ser 1.05; Potassium 4.2; Sodium 139   Recent Lipid Panel    Component Value Date/Time   CHOL 136 08/13/2022 0900   TRIG 146 08/13/2022 0900   HDL 54 08/13/2022 0900   CHOLHDL 2.5 08/13/2022 0900   LDLCALC 57 08/13/2022 0900   Risk Assessment/Calculations:     CHA2DS2-VASc Score = 5  The patient's score is based upon: CHF History: 1 HTN History: 1 Diabetes History: 0 Stroke History: 0 Vascular Disease History: 1 Age Score: 2 Gender Score: 0  Physical Exam:    VS:  BP 106/70   Pulse (!) 116   Ht 5\' 7"  (1.702 m)   Wt 215 lb (97.5 kg)   BMI 33.67 kg/m     Wt Readings from Last 3 Encounters:  03/31/23 215 lb (97.5 kg)  02/20/23 214 lb (97.1 kg)  02/19/23 214 lb 12.8 oz (97.4 kg)    General: Well developed, well nourished, NAD Lungs:Clear to ausculation bilaterally. No wheezes, rales, or rhonchi. Breathing is  unlabored. Cardiovascular: Irregularly irregular. No murmurs Extremities: No edema.  Neuro: Alert and oriented. No focal deficits. No facial asymmetry. MAE spontaneously. Psych: Responds to questions appropriately with normal affect.    ASSESSMENT/PLAN:    Permanent atrial fibrillation: s/p LAAO with Watchman 24mm FLX Pro device. Plan was to continue Xarelto and Plavix due to significant CAD for 45 days. He may now stop Xarelto as of 12/15 and continue Plavix through 6 months. Plan to either continue Plavix or ASA indefinitely at that time due to prior PCIs. TEE instructions reviewed with understanding. Obtain CBC, BMET today for TEE scheduled 12/27. See plan for same day cath below.   After careful review of history and examination, the risks and benefits of transesophageal echocardiogram have been explained including risks  of esophageal damage, perforation (1:10,000 risk), bleeding, pharyngeal hematoma as well as other potential complications associated with conscious sedation including aspiration, arrhythmia, respiratory failure and death. Alternatives to treatment were discussed, questions were answered. Patient is willing to proceed.   ADDEND: Discussed increasing diltiazem to 240mg  daily given elevated rates on EKG today. Encouraged to watch BP at home closely and notify our team of any dizziness or presyncopal symptoms.   DOE/SOB with significant hx of CAD: Reports worsening SOB over the last 1-2 months. Reports similar symptoms felt to be his anginal equivalent in the past. No overt chest pain. Plan TEE 12/27 at 10am and LHC to follow. Last cath with two culprit lesions mid OM1 and LCx/OM branch which were both stented. Continue Plavix. CBC, BMET today.   HTN: Stable with no changes needed at this time.   CKD stage IIIb: Last BMET 02/21/23 with Cr at 1.05. BMET today given upcoming cath 12/27.   HFpEF: Continue current Lasix dose of 80mg  AM. Appears euvolemic today. No edema.   I spent  30 minutes caring for this patient today including face-to-face discussions, ordering and reviewing labs, reviewing records from [facility] and other outside facilities, documenting in the record, and arranging for follow up.      Medication Adjustments/Labs and Tests Ordered: Current medicines are reviewed at length with the patient today.  Concerns regarding medicines are outlined above.  Orders Placed This Encounter  Procedures   Basic Metabolic Panel (BMET)   CBC   EKG 12-Lead   Meds ordered this encounter  Medications   amoxicillin (AMOXIL) 500 MG tablet    Sig: TAKE 4 TABS PRIOR TO DENTAL PROCEDURES ONE HOUR PRIOR TO DENTAL PROCEDURES    Dispense:  4 tablet    Refill:  3    Patient Instructions  Medication Instructions:  STOP Xarelto CONTINUE Plavix  START Amoxicillin 2000mg  Take 4 tabs prior to dental procedures *If you need a refill on your cardiac medications before your next appointment, please call your pharmacy*   Lab Work: TODAY-BMET & CBC If you have labs (blood work) drawn today and your tests are completely normal, you will receive your results only by: MyChart Message (if you have MyChart) OR A paper copy in the mail If you have any lab test that is abnormal or we need to change your treatment, we will call you to review the results.   Testing/Procedures: Your physician has requested that you have a cardiac catheterization. Cardiac catheterization is used to diagnose and/or treat various heart conditions. Doctors may recommend this procedure for a number of different reasons. The most common reason is to evaluate chest pain. Chest pain can be a symptom of coronary artery disease (CAD), and cardiac catheterization can show whether plaque is narrowing or blocking your heart's arteries. This procedure is also used to evaluate the valves, as well as measure the blood flow and oxygen levels in different parts of your heart. For further information please visit  [email or web address] Please follow instruction sheet, as given.   Follow-Up: At [facility], you and your health needs are our priority.  As part of our continuing mission to provide you with exceptional heart care, we have created designated Provider Care Teams.  These Care Teams include your primary Cardiologist (physician) and Advanced Practice Providers (APPs -  Physician Assistants and Nurse Practitioners) who all work together to provide you with the care you need, when you need it.  We recommend signing up for the patient portal  called "MyChart".  Sign up information is provided on this After Visit Summary.  MyChart is used to connect with patients for Virtual Visits (Telemedicine).  Patients are able to view lab/test results, encounter notes, upcoming appointments, etc.  Non-urgent messages can be sent to your provider as well.   To learn more about what you can do with MyChart, go to [email or web address].    Your next appointment:   1 month(s)  Provider:   Georgie Chard, NP    Other Instructions           Cardiac/Peripheral Catheterization   You are scheduled for a Cardiac Catheterization on Friday, December 27 with Dr. Peter [place].  1. Please arrive at the [facility] (Main Entrance A) at [facility]: [address] [place], [place] [address] at 8:30 AM (This time is couple of hour(s) before your procedure to ensure your preparation).   Free valet parking service is available. You will check in at ADMITTING. The support person will be asked to wait in the waiting room.  It is OK to have someone drop you off and come back when you are ready to be discharged.        Special note: Every effort is made to have your procedure done on time. Please understand that emergencies sometimes delay scheduled procedures.  2. Diet: Do not eat solid foods after midnight.  You may have clear liquids until 5 AM the day of the procedure.  3. Labs: You will need to have  blood drawn on Monday, December 9 at [facility] at [facility]. [address], [place]  Open: 7:30am - 5pm    Phone: [phone]. You do not need to be fasting.  4. Medication instructions in preparation for your procedure:   Contrast Allergy: No  Stop taking, Lasix (Furosemide)  Thursday, December 26,  On the morning of your procedure, take Aspirin 81 mg  and any morning medicines NOT listed above.  You may use sips of water.  5. Plan to go home the same day, you will only stay overnight if medically necessary. 6. You MUST have a responsible adult to drive you home. 7. An adult MUST be with you the first 24 hours after you arrive home. 8. Bring a current list of your medications, and the last time and date medication taken. 9. Bring ID and current insurance cards. 10.Please wear clothes that are easy to get on and off and wear slip-on shoes.  Thank you for allowing [place] to care for you!   -- [facility] Cardiovascular services    Signed, Georgie Chard, NP  03/31/2023 9:04 AM    [facility]

## 2023-03-28 NOTE — Progress Notes (Unsigned)
[facility]                                     Cardiology Office Note:    Date:  03/31/2023   ID:  Andrew Hardy., DOB 07/20/1940, MRN [id number]  PCP:  Lonie Peak, PA-C  [facility] Cardiologist:  Norman Herrlich, MD  [facility] Electrophysiologist:  Will Jorja Loa, MD   Referring MD: Lonie Peak, PA-C   Chief Complaint  Patient presents with   Follow-up   one month s/p LAAO    History of Present Illness:    Andrew Hardy. is a 82 y.o. male with a hx of obesity, HTN, HLD, CAD s/p PCI to LCx (07/2021), CKD stage IIIb, RBBB, HFpEF, GI bleeding and permanent atrial fibrillation s/p Watchman who is here for one month follow up.    At the most recent appointment with Dr. Dulce Sellar, the patient was thought to be extremely high risk for bleeding given a prior history of GI bleed and now the use of antiplatelets for CAD in addition to anticoagulation. He was was referred to Dr. Lalla Brothers for consideration of LAAO with Watchman. Given abnormal renal function pre Watchman CT was deferred. He is now s/p successful LAAO with Watchman 24mm FLX Pro device on 02/20/2023. He was restarted on home Xarelto 20mg  daily and Plavix 75mg  daily with no issues with plans to continue this regimen through 45 days (12/15) then stop Xarelto and continue Plavix through at least 6 months therapy. He is greater than one year out from PCI therefore will likely continue ASA monotherapy however will clarify with primary cardiologist if preference would be to continue Plavix only. He will require dental SBE for 6 months as well. Amoxicillin was sent to preferred pharmacy. Post implant TEE will be performed to ensure proper seal of the device.   Today he is here with his wife and reports that he has been well from a Watchman standpoint however does report worsening DOE. Activities which were previously easy for him have now become more difficult due to shortness of breath. He needs to stop to rest  after short durations for several minutes then can resume. He has a significant hx of CAD with last cath 07/2021 at which time he had two culprit lesions mid OM1 and LCx/OM branch which were both stented. He reports his typical anginal equivalent as DOE or SOB and current symptoms being similar to that in the past. Otherwise, he denies chest pain, palpitations, LE edema, orthopnea, PND, dizziness, or syncope. Denies bleeding in stool or urine.      Past Medical History:  Diagnosis Date   Anxiety    Arthritis    Atypical atrial flutter (HCC) 12/14/2014   Bradycardia, sinus 10/15/2014   Burning sensation of feet 01/17/2016   CAD, multiple vessel 10/18/2014   Overview:  DES to [facility] for unstable angina 10/17/14 Diagnostic Summary Severe stenosis of mid Cx likely cause of recent accelerating angina Diffuse severe stenosis of small caliber LAD unchanged from 2012 Normal LV function Paroxysmal SVT noted during procedure 04/23/16:  PCI / Resolute Drug Eluting Stent of the proximal Left Anterior Descending Coronary Artery. Successful PTCA of the ostial 1st Diagonal Coronary Artery   Chronic left-sided low back pain with sciatica 02/21/2016   Coronary artery disease 2003   s/p MI and numerous stents   Coronary artery disease involving native coronary  artery of native heart with angina pectoris (HCC) 10/18/2014   Overview:  DES to [facility] for unstable angina 10/17/14 Diagnostic Summary Severe stenosis of mid Cx likely cause of recent accelerating angina Diffuse severe stenosis of small caliber LAD unchanged from 2012 Normal LV function Paroxysmal SVT noted during procedure 04/23/16:  PCI / Resolute Drug Eluting Stent of the proximal Left Anterior Descending Coronary Artery. 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   Degenerative spondylolisthesis 05/02/2017   Depression    Dyspnea    WITH EXERTION   Enlarged prostate without lower urinary tract symptoms (luts)  2012   frequent urination and nocturia; sees dr Oda Cogan in [place]   Essential hypertension 10/15/2014   Gastroesophageal reflux disease without esophagitis 04/21/2016   GERD (gastroesophageal reflux disease)    Hyperlipidemia    Hypertension    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   IHD (ischemic heart disease) 10/15/2014   Lower limb pain, inferior, right 12/17/2017   Lumbar radiculopathy 02/21/2016   Myocardial infarction [facility]) 2003   NSTEMI (non-ST elevated myocardial infarction) (HCC) 04/24/2016   Overview:  LHC 04/24/15: Severe stenosis of the LAD & Diagonal bifurcation  LV ejection fraction is 55-60 %  Interventional Summary Successful Angiosculpt PCI / Resolute Drug Eluting Stent of the proximal LAD Successful PTCA of the ostial 1st Diagonal Coronary Artery   Paroxysmal SVT (supraventricular tachycardia) (HCC) 11/16/2014   Precordial pain 10/15/2014   Presence of Watchman left atrial appendage closure device 02/20/2023   24mm Watchman FLX Pro with Dr. 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    Past Surgical History:  Procedure Laterality Date   BIOPSY  04/24/2020   Procedure: BIOPSY;  Surgeon: Beverley Fiedler, MD;  Location: [facility] ENDOSCOPY;  Service: Gastroenterology;;   CARDIAC CATHETERIZATION  10-2010   ptca/stent in [place]   CARDIAC CATHETERIZATION  04/23/2016   COLONOSCOPY WITH PROPOFOL N/A 04/24/2020   Procedure: COLONOSCOPY WITH PROPOFOL;  Surgeon: Beverley Fiedler, MD;  Location: [facility] ENDOSCOPY;  Service: Gastroenterology;  Laterality: N/A;    CORONARY ANGIOPLASTY     CORONARY STENT INTERVENTION N/A 08/10/2021   Procedure: CORONARY STENT INTERVENTION;  Surgeon: Marykay Lex, MD;  Location: [facility] INVASIVE CV LAB;  Service: Cardiovascular;  Laterality: N/A;   ESOPHAGOGASTRODUODENOSCOPY (EGD) WITH PROPOFOL N/A 04/24/2020   Procedure: ESOPHAGOGASTRODUODENOSCOPY (EGD) WITH PROPOFOL;  Surgeon: Beverley Fiedler, MD;  Location: [facility] ENDOSCOPY;  Service: Gastroenterology;  Laterality: N/A;   EYE SURGERY  05-1010   bil cataract ,iol   FRACTURE SURGERY     HEMOSTASIS CONTROL  04/24/2020   Procedure: HEMOSTASIS CONTROL;  Surgeon: Beverley Fiedler, MD;  Location: [facility] ENDOSCOPY;  Service: Gastroenterology;;   I & D EXTREMITY Right 02/23/2015   Procedure: RIGHT RING FINGER REVISION AMPUTATION;  Surgeon: Betha Loa, MD;  Location: [facility] OR;  Service: Orthopedics;  Laterality: Right;   KNEE ARTHROSCOPY W/ MENISCAL REPAIR  2000   left knee   LEFT ATRIAL APPENDAGE OCCLUSION N/A 02/20/2023   Procedure: LEFT ATRIAL APPENDAGE OCCLUSION;  Surgeon: Steffanie Dunn  T, MD;  Location: [facility] INVASIVE CV LAB;  Service: Cardiovascular;  Laterality: N/A;   LEFT HEART CATH AND CORONARY ANGIOGRAPHY N/A 08/10/2021   Procedure: LEFT HEART CATH AND CORONARY ANGIOGRAPHY;  Surgeon: Marykay Lex, MD;  Location: [facility] INVASIVE CV LAB;  Service: Cardiovascular;  Laterality: N/A;   LUMBAR EPIDURAL INJECTION  2011   low back pain; DDD   POLYPECTOMY  04/24/2020   Procedure: POLYPECTOMY;  Surgeon: Beverley Fiedler, MD;  Location: [facility] ENDOSCOPY;  Service: Gastroenterology;;   REVERSE SHOULDER ARTHROPLASTY Left 11/23/2021   Procedure: REVERSE SHOULDER ARTHROPLASTY;  Surgeon: Beverely Low, MD;  Location: [facility] ORS;  Service: Orthopedics;  Laterality: Left;   ring finger tramatic qamputation Right    SHOULDER ARTHROSCOPY  03/29/2011   Procedure: ARTHROSCOPY SHOULDER;  Surgeon: Verlee Rossetti;  Location: [facility] OR;  Service: Orthopedics;  Laterality: Right;  RIGHT SHOULDER ARTHROSCOPY WITH OPEN SUBSCAPULAR  REPAIR   SHOULDER OPEN ROTATOR CUFF REPAIR     3 on rt shoulder; 2 on left   TRANSESOPHAGEAL ECHOCARDIOGRAM (CATH LAB) N/A 02/20/2023   Procedure: TRANSESOPHAGEAL ECHOCARDIOGRAM;  Surgeon: Lanier Prude, MD;  Location: [facility] INVASIVE CV LAB;  Service: Cardiovascular;  Laterality: N/A;   TRANSFORAMINAL LUMBAR INTERBODY FUSION (TLIF) WITH PEDICLE SCREW FIXATION 1 LEVEL N/A 04/24/2017   Procedure: TRANSFORAMINAL LUMBAR INTERBODY FUSION (TLIF) L4-5;  Surgeon: Venita Lick, MD;  Location: [facility] OR;  Service: Orthopedics;  Laterality: N/A;  4 hrs    Current Medications: Current Meds  Medication Sig   acetaminophen (TYLENOL) 325 MG tablet Take 2 tablets (650 mg total) by mouth every 6 (six) hours as needed (pain).   Ascorbic Acid (VITAMIN C) 1000 MG tablet Take 1,000 mg by mouth daily.   clopidogrel (PLAVIX) 75 MG tablet Take 1 tablet (75 mg total) by mouth daily.   CO-ENZYME Q-10 PO Take 50 mg by mouth daily.   Cyanocobalamin (B-12 PO) Take 1 tablet by mouth daily.   diltiazem (CARDIZEM CD) 120 MG 24 hr capsule Take 120 mg by mouth daily.   DULoxetine (CYMBALTA) 30 MG capsule Take 30 mg by mouth 2 (two) times daily.   ezetimibe (ZETIA) 10 MG tablet Take 1 tablet (10 mg total) by mouth daily.   finasteride (PROSCAR) 5 MG tablet Take 5 mg by mouth daily.   Flaxseed, Linseed, (FLAXSEED OIL MAX STR) 1300 MG CAPS Take 1,300 mg by mouth daily.   furosemide (LASIX) 80 MG tablet Take 1 tablet (80 mg total) by mouth in the morning. (Patient taking differently: Take 40-80 mg by mouth See admin instructions. 80 mg in the morning, 40 mg in the evening)   gemfibrozil (LOPID) 600 MG tablet TAKE 1/2 TABLET(300 MG) BY MOUTH TWICE DAILY BEFORE A MEAL   Glucosamine HCl (GLUCOSAMINE PO) Take 1 tablet by mouth 2 (two) times daily.   isosorbide mononitrate (IMDUR) 60 MG 24 hr tablet Take 1 tablet (60 mg total) by mouth daily.   metoprolol succinate (TOPROL-XL) 25 MG 24 hr tablet Take 1 tablet (25 mg total) by mouth 3  (three) times daily.   Multiple Vitamins-Minerals (MULTIVITAMINS THER. W/MINERALS) TABS Take 1 tablet by mouth daily.   nitroGLYCERIN (NITROSTAT) 0.4 MG SL tablet PLACE 1 TABLET UNDER THE TONGUE EVERY 5 MINUTES AS NEEDED FOR CHEST PAIN   Omega-3 Fatty Acids (FISH OIL PO) Take 1 tablet by mouth daily.   pantoprazole (PROTONIX) 40 MG tablet Take 1 tablet (40 mg total) by mouth daily.   potassium chloride SA (KLOR-CON M) 20 MEQ  tablet TAKE 2 TABLETS BY MOUTH DAILY THEN TAKE 1 TABLET BY MOUTH AT NIGHT   rosuvastatin (CRESTOR) 10 MG tablet Take 10 mg by mouth daily.   tamsulosin (FLOMAX) 0.4 MG CAPS capsule Take 0.4 mg by mouth daily.   traZODone (DESYREL) 100 MG tablet Take 100 mg by mouth at bedtime.   TURMERIC CURCUMIN PO Take 1 capsule by mouth daily.   [DISCONTINUED] XARELTO 20 MG TABS tablet TAKE 1 TABLET(20 MG) BY MOUTH DAILY WITH SUPPER   amoxicillin (AMOXIL) 500 MG tablet TAKE 4 TABS PRIOR TO DENTAL PROCEDURES ONE HOUR PRIOR TO DENTAL PROCEDURES     Allergies:   Amiodarone, Buprenorphine hcl, Codeine, Hydrocodone, Percocet [oxycodone-acetaminophen], and Sotalol hcl   Social History   Socioeconomic History   Marital status: Married    Spouse name: Not on file   Number of children: Not on file   Years of education: Not on file   Highest education level: Not on file  Occupational History   Not on file  Tobacco Use   Smoking status: Former    Current packs/day: 0.00    Average packs/day: 1 pack/day for 20.0 years (20.0 ttl pk-yrs)    Types: Cigarettes    Start date: 03/20/1974    Quit date: 03/20/1994    Years since quitting: 29.0   Smokeless tobacco: Former    Types: Chew    Quit date: 03/20/1996  Vaping Use   Vaping status: Never Used  Substance and Sexual Activity   Alcohol use: Yes    Alcohol/week: 6.0 standard drinks of alcohol    Types: 6 Cans of beer per week    Comment: BEER- DAILY   Drug use: No   Sexual activity: Not on file  Other Topics Concern   Not on file   Social History Narrative   Not on file   Social Determinants of Health   Financial Resource Strain: Not on file  Food Insecurity: Not on file  Transportation Needs: Not on file  Physical Activity: Not on file  Stress: Not on file  Social Connections: Not on file     Family History: The patient's family history includes Cancer in his father and sister; Heart disease in his father and mother.  ROS:   Please see the history of present illness.    All other systems reviewed and are negative.  EKGs/Labs/Other Studies Reviewed:    The following studies were reviewed today:   Cardiac Studies & Procedures   CARDIAC CATHETERIZATION  CARDIAC CATHETERIZATION 08/10/2021  Narrative   Prox RCA to Mid RCA lesion is 30% stenosed.   Previously placed Prox LAD to Mid LAD stent (unknown type) is  widely patent.  Jailed 1st Diag lesion is 60% stenosed.   Previously placed Prox Cx to Dist Cx stent (unknown type) is  widely patent.   Lesion #1: Dist Cx lesion is 90% stenosed.   A drug-eluting stent was successfully placed using a STENT ONYX FRONTIER 2.25X12.  Deployed at 2.35 mm and postdilated to 2.45 mm in the overlap segment.   Post intervention, there is a 0% residual stenosis.   Lesion #2: 1st Mrg lesion is 80% stenosed with 95% stenosed side branch in Lat 1st Mrg.   Mid LAD lesion is 55% stenosed.   Dist LAD lesion is 50% stenosed.   A drug-eluting stent was successfully placed using a STENT ONYX FRONTIER 2.25X1 -> deployed to 2.45 mm.   Post intervention, there is a 0% residual stenosis.  Post intervention, the  side branch was reduced to 100% residual stenosis.  (Patient did have chest pain with stent placement, resolved with sublingual nitroglycerin and fentanyl/Versed.)  Multivessel CAD with 2 culprit lesions: Mid OM1 (85%) at small branch and distal LCx-OM2 (90%) just distal to prior stent.  Mild ISR in RCA stent and ostial diagonal (jailed) 60%.  Tandem 50 to 60% lesions in the mid  LAD that are in hinge points.  Not likely flow-limiting. Successful two-vessel PCI of 2 culprit lesions reducing to 0% stenosis each treated with Onyx frontier DES 2.25 mm 12 mm stents dilated to 2.45 mm. Preserved LVEF with low normal roughly 50 to 55%.  Inadequate filling-therefore unable to truly estimate EF and wall motion.  Findings Coronary Findings Diagnostic  Dominance: Right  Left Anterior Descending Vessel is moderate in size. Previously placed Prox LAD to Mid LAD stent (unknown type) is  widely patent. Mid LAD lesion is 55% stenosed. The lesion is located at the bend. Dist LAD lesion is 50% stenosed. The lesion is located at the bend.  First Diagonal Branch Vessel is small in size. 1st Diag lesion is 60% stenosed. The lesion was previously treated using angioplasty . Rescue PTCA after stenting across  Second Diagonal Branch Vessel is small in size.  Third Diagonal Branch Vessel is small in size.  Left Circumflex Vessel is small. Previously placed Prox Cx to Dist Cx stent (unknown type) is  widely patent. Dist Cx lesion is 90% stenosed. The lesion is distal to major branch, focal, concentric and irregular. Distal to the previous stent  First Obtuse Marginal Branch Vessel is small in size. 1st Mrg lesion is 80% stenosed with 95% stenosed side branch in Lat 1st Mrg. The lesion is eccentric and ulcerative. At very small sidebranch The lesion was not previously treated .  Left Posterior Atrioventricular Artery Vessel is small in size.  Right Coronary Artery Prox RCA to Mid RCA lesion is 30% stenosed. The lesion was previously treated .  Intervention  Prox Cx to Dist Cx lesion Stent (Also treats lesions: Dist Cx) Lesion length:  8 mm. CATH VISTA GUIDE 6FR XB3.5 guide catheter was inserted. Lesion crossed with guidewire using a WIRE ASAHI PROWATER 180CM. Pre-stent angioplasty was performed using a BALLN SAPPHIRE 2.25X10. Maximum pressure:  10 atm. Inflation time: 20  sec. A drug-eluting stent was successfully placed using a STENT ONYX FRONTIER 2.25X12. Maximum pressure: 14 atm. Inflation time: 30 sec. Stent strut is well apposed. Stent balloon pulled back to the overlapping segment and inflated to high ATM Deployed to 2.35 mm, but that the overlap site dilated to 2.45 mm. Post-stent angioplasty was not performed. Stent balloon at high ATM for overlap-18 ATM Downstream lesion covered with a overlapping stent, overlapping segment postdilated with stent balloon to high ATM. Post-Intervention Lesion Assessment The intervention was successful. Pre-interventional TIMI flow is 3. Post-intervention TIMI flow is 3. There is a 0% residual stenosis post intervention.  Dist Cx lesion Stent (Also treats lesions: Prox Cx to Dist Cx) See details in Prox Cx to Dist Cx lesion. Post-Intervention Lesion Assessment The intervention was successful. Pre-interventional TIMI flow is 3. Post-intervention TIMI flow is 3. Treated lesion length:  10 mm. No complications occurred at this lesion. There is a 0% residual stenosis post intervention.  1st Mrg lesion with side branch in Lat 1st Mrg Stent - Main Branch Lesion length:  10 mm. CATH VISTA GUIDE 6FR XB3.5 guide catheter was inserted. Lesion crossed with guidewire using a WIRE ASAHI PROWATER 180CM. Pre-stent  angioplasty was performed using a BALLN SAPPHIRE 2.25X10. Maximum pressure:  10 atm. Inflation time:  20 sec. A drug-eluting stent was successfully placed using a STENT ONYX FRONTIER 2.25X12. Maximum pressure: 16 atm. Inflation time: 30 sec. Stent strut is well apposed. Deployed to 2.45 mm Post-stent angioplasty was not performed. Same P[place]ater wire was read directed after PCI of the distal LCx into the OM1 Post-Intervention Lesion Assessment The intervention was successful. Pre-interventional TIMI flow is 3. Post-intervention TIMI flow is 3. Treated lesion length:  12 mm. Very small sidebranch occluded =&gt; 4 out of 10 chest pain,  resolved with sublingual nitroglycerin and second round of fentanyl and Versed. There is a 0% residual stenosis in the main branch post intervention. There is a 100% residual stenosis in the side branch post intervention.   STRESS TESTS  MYOCARDIAL PERFUSION IMAGING 03/25/2019  Narrative  There was no ST segment deviation noted during stress.  No T wave inversion was noted during stress.  Nongated study due to atrial fibrillation.  The study is normal.  This is a low risk study.   ECHOCARDIOGRAM  ECHOCARDIOGRAM COMPLETE 01/29/2022  Narrative ECHOCARDIOGRAM REPORT    Patient Name:   Atzel Ny. Date of Exam: 01/29/2022 Medical Rec #:  [id number]       Height:       66.0 in Accession #:    [id number]      Weight:       204.8 lb Date of Birth:  [birth year]       BSA:          2.020 m Patient Age:    43 years        BP:           94/60 mmHg Patient Gender: M               HR:           92 bpm. Exam Location:  [place]  Procedure: 2D Echo, 3D Echo, Cardiac Doppler, Color Doppler and Strain Analysis  Indications:    Coronary artery disease involving native coronary artery of native heart with angina pectoris (HCC) [I25.119 (ICD-10-CM)]; Persistent atrial fibrillation (HCC) [I48.19 (ICD-10-CM)]; Chronic anticoagulation [Z79.01 (ICD-10-CM)]; High risk medication use [Z79.899 (ICD-10-CM)]; Hypertensive heart disease with heart failure (HCC) [I11.0 (ICD-10-CM)]; Mixed hyperlipidemia [E78.2 (ICD-10-CM)]; SOB (shortness of breath) on exertion [R06.02 (ICD-10-CM)]  History:        Patient has prior history of Echocardiogram examinations, most recent 05/04/2019. CAD and Previous Myocardial Infarction, Arrythmias:Atrial Fibrillation, SVT and PVC; Risk Factors:Dyslipidemia and Hypertension.  Sonographer:    Margreta Journey RDCS Referring Phys: [id number] BRIAN J MUNLEY  IMPRESSIONS   1. Left ventricular ejection fraction, by estimation, is 50 to 55%. Left ventricular ejection  fraction by 3D volume is 53 %. The left ventricle has low normal function. The left ventricle has no regional wall motion abnormalities. Left ventricular diastolic parameters are indeterminate. The average left ventricular global longitudinal strain is -12.8 %. The global longitudinal strain is abnormal. 2. Right ventricular systolic function is normal. The right ventricular size is normal. There is normal pulmonary artery systolic pressure. 3. Left atrial size was mildly dilated. 4. The mitral valve is degenerative. Mild mitral valve regurgitation. No evidence of mitral stenosis. 5. The aortic valve is tricuspid. Aortic valve regurgitation is mild. Aortic valve sclerosis is present, with no evidence of aortic valve stenosis. 6. Aortic normal DTA. 7. The inferior vena cava is dilated in size with >50% respiratory variability,  suggesting right atrial pressure of 8 mmHg.  FINDINGS Left Ventricle: Left ventricular ejection fraction, by estimation, is 50 to 55%. Left ventricular ejection fraction by 3D volume is 53 %. The left ventricle has low normal function. The left ventricle has no regional wall motion abnormalities. The average left ventricular global longitudinal strain is -12.8 %. The global longitudinal strain is abnormal. The left ventricular internal cavity size was normal in size. There is no left ventricular hypertrophy. Left ventricular diastolic parameters are indeterminate.  Right Ventricle: The right ventricular size is normal. No increase in right ventricular wall thickness. Right ventricular systolic function is normal. There is normal pulmonary artery systolic pressure. The tricuspid regurgitant velocity is 2.47 m/s, and with an assumed right atrial pressure of 8 mmHg, the estimated right ventricular systolic pressure is 32.4 mmHg.  Left Atrium: Left atrial size was mildly dilated.  Right Atrium: Right atrial size was normal in size.  Pericardium: There is no evidence of  pericardial effusion.  Mitral Valve: The mitral valve is degenerative in appearance. Mild mitral annular calcification. Mild mitral valve regurgitation. No evidence of mitral valve stenosis.  Tricuspid Valve: The tricuspid valve is normal in structure. Tricuspid valve regurgitation is mild . No evidence of tricuspid stenosis.  Aortic Valve: The aortic valve is tricuspid. Aortic valve regurgitation is mild. Aortic regurgitation PHT measures 724 msec. Aortic valve sclerosis is present, with no evidence of aortic valve stenosis.  Pulmonic Valve: The pulmonic valve was normal in structure. Pulmonic valve regurgitation is not visualized. No evidence of pulmonic stenosis.  Aorta: The aortic root and ascending aorta are structurally normal, with no evidence of dilitation, normal DTA and the aortic arch was not well visualized.  Venous: A systolic blunting flow pattern is recorded from the right upper pulmonary vein. The inferior vena cava is dilated in size with greater than 50% respiratory variability, suggesting right atrial pressure of 8 mmHg.  IAS/Shunts: No atrial level shunt detected by color flow Doppler.   LEFT VENTRICLE PLAX 2D LVIDd:         4.20 cm         Diastology LVIDs:         3.00 cm         LV e' medial:    8.38 cm/s LV PW:         1.10 cm         LV E/e' medial:  11.9 LV IVS:        1.20 cm         LV e' lateral:   12.60 cm/s LVOT diam:     2.00 cm         LV E/e' lateral: 7.9 LV SV:         48 LV SV Index:   24              2D LVOT Area:     3.14 cm        Longitudinal Strain 2D Strain GLS  -12.8 % LV Volumes (MOD)               Avg: LV vol d, MOD    74.4 ml A2C:                           3D Volume EF LV vol d, MOD    109.0 ml      LV 3D EF:    Left A4C:  ventricul LV vol s, MOD    33.3 ml                    ar A2C:                                        ejection LV vol s, MOD    50.0 ml                    fraction A4C:                                         by 3D LV SV MOD A2C:   41.1 ml                    volume is LV SV MOD A4C:   109.0 ml                   53 %. LV SV MOD BP:    52.7 ml  3D Volume EF: 3D EF:        53 % LV EDV:       118 ml LV ESV:       56 ml LV SV:        62 ml  RIGHT VENTRICLE             IVC RV Basal diam:  3.00 cm     IVC diam: 2.50 cm RV S prime:     13.80 cm/s TAPSE (M-mode): 1.6 cm  LEFT ATRIUM             Index        RIGHT ATRIUM           Index LA diam:        4.10 cm 2.03 cm/m   RA Area:     17.30 cm LA Vol (A2C):   75.8 ml 37.52 ml/m  RA Volume:   37.00 ml  18.32 ml/m LA Vol (A4C):   63.7 ml 31.53 ml/m LA Biplane Vol: 70.6 ml 34.95 ml/m AORTIC VALVE LVOT Vmax:   87.70 cm/s LVOT Vmean:  62.500 cm/s LVOT VTI:    0.152 m AI PHT:      724 msec  AORTA Ao Root diam: 3.10 cm Ao Asc diam:  3.40 cm Ao Desc diam: 2.30 cm  MITRAL VALVE               TRICUSPID VALVE MV Area (PHT): 3.53 cm    TR Peak grad:   24.4 mmHg MV Decel Time: 215 msec    TR Vmax:        247.00 cm/s MR Peak grad: 50.7 mmHg MR Vmax:      356.00 cm/s  SHUNTS MV E velocity: 99.80 cm/s  Systemic VTI:  0.15 m Systemic Diam: 2.00 cm  Norman Herrlich MD Electronically signed by Norman Herrlich MD Signature Date/Time: 01/29/2022/12:40:53 PM    Final   TEE  ECHO TEE 02/20/2023  Narrative TRANSESOPHOGEAL ECHO REPORT    Patient Name:   Gaza Loredo. Date of Exam: 02/20/2023 Medical Rec #:  [id number]       Height:       67.0 in Accession #:    [id number]      Weight:  214.8 lb Date of Birth:  [birth year]       BSA:          2.085 m Patient Age:    82 years        BP:           118/76 mmHg Patient Gender: M               HR:           90 bpm. Exam Location:  Inpatient  Procedure: Transesophageal Echo, Color Doppler and 3D Echo  Indications:     Watchman  History:         Patient has prior history of Echocardiogram examinations, most recent 01/29/2022. CAD, Arrythmias:Atrial Flutter;  Risk Factors:Hypertension.  Sonographer:     Darlys Gales Referring Phys:  [id number] Lanier Prude Diagnosing Phys: Thurmon Fair MD  PROCEDURE: After discussion of the risks and benefits of a TEE, an informed consent was obtained. The transesophogeal probe was passed without difficulty through the esophogus of the patient. Sedation performed by different physician. The patient developed no complications during the procedure.  Transesophageal echocardiography, including live and postprocessed 3D imaging was used to assist with device selection, transseptal puncture, device deployment, monitoring for complications and assessment of the final result.  IMPRESSIONS   1. Left ventricular ejection fraction, by estimation, is 55 to 60%. The left ventricle has normal function. The left ventricle has no regional wall motion abnormalities. Left ventricular diastolic function could not be evaluated. 2. Right ventricular systolic function is normal. The right ventricular size is normal. Tricuspid regurgitation signal is inadequate for assessing PA pressure. The estimated right ventricular systolic pressure is 23.3 mmHg. 3. Left atrial size was severely dilated. No left atrial/left atrial appendage thrombus was detected. 4. Evidence of atrial level shunting detected by color flow Doppler. 5. Right atrial size was mildly dilated. 6. The mitral valve is normal in structure. Trivial mitral valve regurgitation. 7. The aortic valve is tricuspid. There is mild calcification of the aortic valve. Aortic valve regurgitation is mild. Aortic valve sclerosis is present, with no evidence of aortic valve stenosis. 8. There is mild (Grade II) layered plaque.  FINDINGS Left Ventricle: Left ventricular ejection fraction, by estimation, is 55 to 60%. The left ventricle has normal function. The left ventricle has no regional wall motion abnormalities. The left ventricular internal cavity size was normal in size. There  is no left ventricular hypertrophy. Left ventricular diastolic function could not be evaluated due to atrial fibrillation. Left ventricular diastolic function could not be evaluated.  Right Ventricle: The right ventricular size is normal. No increase in right ventricular wall thickness. Right ventricular systolic function is normal. Tricuspid regurgitation signal is inadequate for assessing PA pressure. The tricuspid regurgitant velocity is 2.14 m/s, and with an assumed right atrial pressure of 5 mmHg, the estimated right ventricular systolic pressure is 23.3 mmHg.  Left Atrium: Left atrial size was severely dilated. No left atrial/left atrial appendage thrombus was detected.  Right Atrium: Right atrial size was mildly dilated.  Pericardium: Trivial pericardial effusion is present.  Mitral Valve: The mitral valve is normal in structure. Trivial mitral valve regurgitation.  Tricuspid Valve: The tricuspid valve is normal in structure. Tricuspid valve regurgitation is mild.  Aortic Valve: The aortic valve is tricuspid. There is mild calcification of the aortic valve. Aortic valve regurgitation is mild. Aortic valve sclerosis is present, with no evidence of aortic valve stenosis.  Pulmonic Valve: The pulmonic valve was normal in  structure. Pulmonic valve regurgitation is trivial. No evidence of pulmonic stenosis.  Aorta: The aortic root, ascending aorta, aortic arch and descending aorta are all structurally normal, with no evidence of dilitation or obstruction. There is mild (Grade II) layered plaque.  IAS/Shunts: The interatrial septum appears to be lipomatous. Evidence of atrial level shunting detected by color flow Doppler. There is no evidence of interatrial shunting at the beginning of the procedure. 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   TRICUSPID VALVE TR Peak grad:   18.3 mmHg TR Vmax:        214.00  cm/s  Mihai Croitoru MD Electronically signed by Thurmon Fair MD Signature Date/Time: 02/20/2023/2:55:20 PM    Final   MONITORS  LONG TERM MONITOR (3-14 DAYS) 01/29/2022  Narrative Patch Wear Time:  7 days and 10 hours (2023-09-25T08:59:54-0400 to 2023-10-02T19:21:24-0400)  Atrial Fibrillation occurred continuously (100% burden), ranging from 57-177 bpm (avg of 96 bpm). Bundle Branch Block/IVCD was present.  There were no pauses of 3 seconds or greater  Isolated VEs were rare (<1.0%), VE Couplets were rare (<1.0%), and no VE Triplets were present. Ventricular Bigeminy was present.  Heart rate was greater than 110 bpm 26% of the time daytime 2% of the time nighttime.  Her 48 triggered events all associated with atrial fibrillation generally rapid rates greater than 110 bpm and 1 episode with ventricular bigeminy.           EKG:  EKG is ordered today.  The ekg ordered today demonstrates AF with RBBB and elevated rates at 119bpm.   Recent Labs: 08/13/2022: ALT 10 02/19/2023: Hemoglobin 12.7; Platelets 208 02/21/2023: BUN 16; Creatinine, Ser 1.05; Potassium 4.2; Sodium 139   Recent Lipid Panel    Component Value Date/Time   CHOL 136 08/13/2022 0900   TRIG 146 08/13/2022 0900   HDL 54 08/13/2022 0900   CHOLHDL 2.5 08/13/2022 0900   LDLCALC 57 08/13/2022 0900   Risk Assessment/Calculations:     CHA2DS2-VASc Score = 5  The patient's score is based upon: CHF History: 1 HTN History: 1 Diabetes History: 0 Stroke History: 0 Vascular Disease History: 1 Age Score: 2 Gender Score: 0  Physical Exam:    VS:  BP 106/70   Pulse (!) 116   Ht 5\' 7"  (1.702 m)   Wt 215 lb (97.5 kg)   BMI 33.67 kg/m     Wt Readings from Last 3 Encounters:  03/31/23 215 lb (97.5 kg)  02/20/23 214 lb (97.1 kg)  02/19/23 214 lb 12.8 oz (97.4 kg)    General: Well developed, well nourished, NAD Lungs:Clear to ausculation bilaterally. No wheezes, rales, or rhonchi. Breathing is  unlabored. Cardiovascular: Irregularly irregular. No murmurs Extremities: No edema.  Neuro: Alert and oriented. No focal deficits. No facial asymmetry. MAE spontaneously. Psych: Responds to questions appropriately with normal affect.    ASSESSMENT/PLAN:    Permanent atrial fibrillation: s/p LAAO with Watchman 24mm FLX Pro device. Plan was to continue Xarelto and Plavix due to significant CAD for 45 days. He may now stop Xarelto as of 12/15 and continue Plavix through 6 months. Plan to either continue Plavix or ASA indefinitely at that time due to prior PCIs. TEE instructions reviewed with understanding. Obtain CBC, BMET today for TEE scheduled 12/27. See plan for same day cath below.   After careful review of history and examination, the risks and benefits of transesophageal echocardiogram have been explained including risks  of esophageal damage, perforation (1:10,000 risk), bleeding, pharyngeal hematoma as well as other potential complications associated with conscious sedation including aspiration, arrhythmia, respiratory failure and death. Alternatives to treatment were discussed, questions were answered. Patient is willing to proceed.   ADDEND: Discussed increasing diltiazem to 240mg  daily given elevated rates on EKG today. Encouraged to watch BP at home closely and notify our team of any dizziness or presyncopal symptoms.   DOE/SOB with significant hx of CAD: Reports worsening SOB over the last 1-2 months. Reports similar symptoms felt to be his anginal equivalent in the past. No overt chest pain. Plan TEE 12/27 at 10am and LHC to follow. Last cath with two culprit lesions mid OM1 and LCx/OM branch which were both stented. Continue Plavix. CBC, BMET today.   HTN: Stable with no changes needed at this time.   CKD stage IIIb: Last BMET 02/21/23 with Cr at 1.05. BMET today given upcoming cath 12/27.   HFpEF: Continue current Lasix dose of 80mg  AM. Appears euvolemic today. No edema.   I spent  30 minutes caring for this patient today including face-to-face discussions, ordering and reviewing labs, reviewing records from [facility] and other outside facilities, documenting in the record, and arranging for follow up.      Medication Adjustments/Labs and Tests Ordered: Current medicines are reviewed at length with the patient today.  Concerns regarding medicines are outlined above.  Orders Placed This Encounter  Procedures   Basic Metabolic Panel (BMET)   CBC   EKG 12-Lead   Meds ordered this encounter  Medications   amoxicillin (AMOXIL) 500 MG tablet    Sig: TAKE 4 TABS PRIOR TO DENTAL PROCEDURES ONE HOUR PRIOR TO DENTAL PROCEDURES    Dispense:  4 tablet    Refill:  3    Patient Instructions  Medication Instructions:  STOP Xarelto CONTINUE Plavix  START Amoxicillin 2000mg  Take 4 tabs prior to dental procedures *If you need a refill on your cardiac medications before your next appointment, please call your pharmacy*   Lab Work: TODAY-BMET & CBC If you have labs (blood work) drawn today and your tests are completely normal, you will receive your results only by: MyChart Message (if you have MyChart) OR A paper copy in the mail If you have any lab test that is abnormal or we need to change your treatment, we will call you to review the results.   Testing/Procedures: Your physician has requested that you have a cardiac catheterization. Cardiac catheterization is used to diagnose and/or treat various heart conditions. Doctors may recommend this procedure for a number of different reasons. The most common reason is to evaluate chest pain. Chest pain can be a symptom of coronary artery disease (CAD), and cardiac catheterization can show whether plaque is narrowing or blocking your heart's arteries. This procedure is also used to evaluate the valves, as well as measure the blood flow and oxygen levels in different parts of your heart. For further information please visit  [email or web address] Please follow instruction sheet, as given.   Follow-Up: At [facility], you and your health needs are our priority.  As part of our continuing mission to provide you with exceptional heart care, we have created designated Provider Care Teams.  These Care Teams include your primary Cardiologist (physician) and Advanced Practice Providers (APPs -  Physician Assistants and Nurse Practitioners) who all work together to provide you with the care you need, when you need it.  We recommend signing up for the patient portal  called "MyChart".  Sign up information is provided on this After Visit Summary.  MyChart is used to connect with patients for Virtual Visits (Telemedicine).  Patients are able to view lab/test results, encounter notes, upcoming appointments, etc.  Non-urgent messages can be sent to your provider as well.   To learn more about what you can do with MyChart, go to [email or web address].    Your next appointment:   1 month(s)  Provider:   Georgie Chard, NP    Other Instructions           Cardiac/Peripheral Catheterization   You are scheduled for a Cardiac Catheterization on Friday, December 27 with Dr. Peter Swaziland.  1. Please arrive at the [facility] (Main Entrance A) at [facility]: [address] [place], [place] [address] at 8:30 AM (This time is couple of hour(s) before your procedure to ensure your preparation).   Free valet parking service is available. You will check in at ADMITTING. The support person will be asked to wait in the waiting room.  It is OK to have someone drop you off and come back when you are ready to be discharged.        Special note: Every effort is made to have your procedure done on time. Please understand that emergencies sometimes delay scheduled procedures.  2. Diet: Do not eat solid foods after midnight.  You may have clear liquids until 5 AM the day of the procedure.  3. Labs: You will need to have  blood drawn on Monday, December 9 at [facility] at [facility]. [address], [place]  Open: 7:30am - 5pm    Phone: [phone]. You do not need to be fasting.  4. Medication instructions in preparation for your procedure:   Contrast Allergy: No  Stop taking, Lasix (Furosemide)  Thursday, December 26,  On the morning of your procedure, take Aspirin 81 mg  and any morning medicines NOT listed above.  You may use sips of water.  5. Plan to go home the same day, you will only stay overnight if medically necessary. 6. You MUST have a responsible adult to drive you home. 7. An adult MUST be with you the first 24 hours after you arrive home. 8. Bring a current list of your medications, and the last time and date medication taken. 9. Bring ID and current insurance cards. 10.Please wear clothes that are easy to get on and off and wear slip-on shoes.  Thank you for allowing [place] to care for you!   -- [facility] Invasive Cardiovascular services    Signed, Georgie Chard, NP  03/31/2023 9:04 AM    [facility]

## 2023-03-31 ENCOUNTER — Ambulatory Visit: Payer: PPO | Attending: Cardiovascular Disease | Admitting: Cardiology

## 2023-03-31 VITALS — BP 106/70 | HR 116 | Ht 67.0 in | Wt 215.0 lb

## 2023-03-31 DIAGNOSIS — Z95818 Presence of other cardiac implants and grafts: Secondary | ICD-10-CM

## 2023-03-31 DIAGNOSIS — I214 Non-ST elevation (NSTEMI) myocardial infarction: Secondary | ICD-10-CM | POA: Diagnosis not present

## 2023-03-31 DIAGNOSIS — I4821 Permanent atrial fibrillation: Secondary | ICD-10-CM | POA: Diagnosis not present

## 2023-03-31 DIAGNOSIS — I5022 Chronic systolic (congestive) heart failure: Secondary | ICD-10-CM

## 2023-03-31 DIAGNOSIS — I25119 Atherosclerotic heart disease of native coronary artery with unspecified angina pectoris: Secondary | ICD-10-CM

## 2023-03-31 DIAGNOSIS — I2510 Atherosclerotic heart disease of native coronary artery without angina pectoris: Secondary | ICD-10-CM

## 2023-03-31 DIAGNOSIS — I4510 Unspecified right bundle-branch block: Secondary | ICD-10-CM

## 2023-03-31 DIAGNOSIS — R0602 Shortness of breath: Secondary | ICD-10-CM

## 2023-03-31 LAB — CBC

## 2023-03-31 MED ORDER — AMOXICILLIN 500 MG PO TABS: ORAL_TABLET | ORAL | 3 refills | Status: DC

## 2023-03-31 NOTE — Patient Instructions (Addendum)
Medication Instructions:  STOP Xarelto CONTINUE Plavix  START Amoxicillin 2000mg  Take 4 tabs prior to dental procedures *If you need a refill on your cardiac medications before your next appointment, please call your pharmacy*   Lab Work: TODAY-BMET & CBC If you have labs (blood work) drawn today and your tests are completely normal, you will receive your results only by: MyChart Message (if you have MyChart) OR A paper copy in the mail If you have any lab test that is abnormal or we need to change your treatment, we will call you to review the results.   Testing/Procedures: Your physician has requested that you have a cardiac catheterization. Cardiac catheterization is used to diagnose and/or treat various heart conditions. Doctors may recommend this procedure for a number of different reasons. The most common reason is to evaluate chest pain. Chest pain can be a symptom of coronary artery disease (CAD), and cardiac catheterization can show whether plaque is narrowing or blocking your heart's arteries. This procedure is also used to evaluate the valves, as well as measure the blood flow and oxygen levels in different parts of your heart. For further information please visit [email or web address] Please follow instruction sheet, as given.   Follow-Up: At [facility], you and your health needs are our priority.  As part of our continuing mission to provide you with exceptional heart care, we have created designated Provider Care Teams.  These Care Teams include your primary Cardiologist (physician) and Advanced Practice Providers (APPs -  Physician Assistants and Nurse Practitioners) who all work together to provide you with the care you need, when you need it.  We recommend signing up for the patient portal called "MyChart".  Sign up information is provided on this After Visit Summary.  MyChart is used to connect with patients for Virtual Visits (Telemedicine).  Patients are able to  view lab/test results, encounter notes, upcoming appointments, etc.  Non-urgent messages can be sent to your provider as well.   To learn more about what you can do with MyChart, go to [email or web address].    Your next appointment:   1 month(s)  Provider:   Georgie Chard, NP    Other Instructions           Cardiac Catheterization   You are scheduled for a Cardiac Catheterization on Friday, December 27 with Dr. Peter Swaziland.  1. Please arrive at the [facility] (Main Entrance A) at [facility]: [address] [place], [place] [address] at 9:00AM (This time is couple of hour(s) before your procedure to ensure your preparation).   Free valet parking service is available. You will check in at ADMITTING. The support person will be asked to wait in the waiting room.  It is OK to have someone drop you off and come back when you are ready to be discharged.        Special note: Every effort is made to have your procedure done on time. Please understand that emergencies sometimes delay scheduled procedures.  2. Diet: Do not eat solid foods after midnight.  You may have clear liquids until 5 AM the day of the procedure.  3. Labs: You will need to have blood drawn on Monday, December 9 at [facility]. [address], [place]  Open: 7:30am - 5pm    Phone: [phone]. You do not need to be fasting.  4. Medication instructions in preparation for your procedure:   Contrast Allergy: No  Stop  taking, Lasix (Furosemide)  Thursday, December 26,  On the morning of your procedure, take Plavix (Clopidogrel) 75mg  and any morning medicines NOT listed above.  You may use sips of water.  5. Plan to go home the same day, you will only stay overnight if medically necessary. 6. You MUST have a responsible adult to drive you home. 7. An adult MUST be with you the first 24 hours after you arrive home. 8. Bring a current list of your medications, and  the last time and date medication taken. 9. Bring ID and current insurance cards. 10.Please wear clothes that are easy to get on and off and wear slip-on shoes.  Thank you for allowing [place] to care for you!   -- [place] [facility] services

## 2023-04-01 LAB — BASIC METABOLIC PANEL
BUN/Creatinine Ratio: 15 (ref 10–24)
BUN: 18 mg/dL (ref 8–27)
CO2: 23 mmol/L (ref 20–29)
Calcium: 9.4 mg/dL (ref 8.6–10.2)
Chloride: 103 mmol/L (ref 96–106)
Creatinine, Ser: 1.2 mg/dL (ref 0.76–1.27)
Glucose: 165 mg/dL — ABNORMAL HIGH (ref 70–99)
Potassium: 4.4 mmol/L (ref 3.5–5.2)
Sodium: 141 mmol/L (ref 134–144)
eGFR: 61 mL/min/{1.73_m2} (ref >59)

## 2023-04-01 LAB — CBC
Hematocrit: 38.9 % (ref 37.5–51.0)
Hemoglobin: 12.2 g/dL — ABNORMAL LOW (ref 13.0–17.7)
MCH: 28.7 pg (ref 26.6–33.0)
MCHC: 31.4 g/dL — ABNORMAL LOW (ref 31.5–35.7)
MCV: 92 fL (ref 79–97)
Platelets: 272 10*3/uL (ref 150–450)
RBC: 4.25 x10E6/uL (ref 4.14–5.80)
RDW: 13.9 % (ref 11.6–15.4)
WBC: 7.1 10*3/uL (ref 3.4–10.8)

## 2023-04-03 ENCOUNTER — Ambulatory Visit: Payer: PPO | Admitting: Cardiology

## 2023-04-14 ENCOUNTER — Telehealth: Payer: Self-pay | Admitting: *Deleted

## 2023-04-14 NOTE — Telephone Encounter (Signed)
Cardiac Catheterization scheduled at [facility] for: Friday April 18, 2023 12 Noon/TEE 10 AM Arrival time [facility] Main Entrance A at: 9 AM  Nothing to eat after or drink after midnight prior to procedures, may have sips of water to take medications.  Medication instructions: -Hold:  Lasix/KCl-AM of procedures -Other usual morning medications can be taken with sips of water including aspirin 81 mg and Plavix 75 mg.  Patient is not longer taking Xarelto-confirmed with patient he discontinued 04/06/23-see 03/31/23 Office Note  Plan to go home the same day, you will only stay overnight if medically necessary.  You must have responsible adult to drive you home.  Someone must be with you the first 24 hours after you arrive home.  Reviewed procedure instructions with patient.

## 2023-04-17 NOTE — Progress Notes (Deleted)
 [facility]                                     Cardiology Office Note:    Date:  04/17/2023   ID:  Andrew Hardy., DOB 08/30/1940, MRN [id number]  PCP:  Lonie Peak, PA-C  [facility] Cardiologist:  Norman Herrlich, MD  [facility] Electrophysiologist:  Will Jorja Loa, MD   Referring MD: Lonie Peak, PA-C   No chief complaint on file. ***  History of Present Illness:    Andrew Hardy. is a 82 y.o. male with a hx of obesity, HTN, HLD, CAD s/p PCI to LCx (07/2021), CKD stage IIIb, RBBB, HFpEF, GI bleeding and permanent atrial fibrillation s/p Watchman who is here for one month follow up.    At the most recent appointment with Dr. Dulce Sellar, the patient was thought to be extremely high risk for bleeding given a prior history of GI bleed and now the use of antiplatelets for CAD in addition to anticoagulation. He was was referred to Dr. Lalla Brothers for consideration of LAAO with Watchman. Given abnormal renal function pre Watchman CT was deferred. He is now s/p successful LAAO with Watchman 24mm FLX Pro device on 02/20/2023. He was restarted on home Xarelto 20mg  daily and Plavix 75mg  daily with no issues with plans to continue this regimen through 45 days (12/15) then stop Xarelto and continue Plavix through at least 6 months therapy. He is greater than one year out from PCI therefore will likely continue ASA monotherapy however will clarify with primary cardiologist if preference would be to continue Plavix only. He will require dental SBE for 6 months as well. Amoxicillin was sent to preferred pharmacy. Post implant TEE will be performed to ensure proper seal of the device.    Today he is here with his wife and reports that he has been well from a Watchman standpoint however does report worsening DOE. Activities which were previously easy for him have now become more difficult due to shortness of breath. He needs to stop to rest after short durations for several minutes  then can resume. He has a significant hx of CAD with last cath 07/2021 at which time he had two culprit lesions mid OM1 and LCx/OM branch which were both stented. He reports his typical anginal equivalent as DOE or SOB and current symptoms being similar to that in the past. Otherwise, he denies chest pain, palpitations, LE edema, orthopnea, PND, dizziness, or syncope. Denies bleeding in stool or urine.       Permanent atrial fibrillation: s/p LAAO with Watchman 24mm FLX Pro device. Plan was to continue Xarelto and Plavix due to significant CAD for 45 days. He may now stop Xarelto as of 12/15 and continue Plavix through 6 months. Plan to either continue Plavix or ASA indefinitely at that time due to prior PCIs. TEE instructions reviewed with understanding. Obtain CBC, BMET today for TEE scheduled 12/27. See plan for same day cath below.    After careful review of history and examination, the risks and benefits of transesophageal echocardiogram have been explained including risks of esophageal damage, perforation (1:10,000 risk), bleeding, pharyngeal hematoma as well as other potential complications associated with conscious sedation including aspiration, arrhythmia, respiratory failure and death. Alternatives to treatment were discussed, questions were answered. Patient is willing to proceed.    ADDEND: Discussed increasing diltiazem to 240mg  daily given  elevated rates on EKG today. Encouraged to watch BP at home closely and notify our team of any dizziness or presyncopal symptoms.    DOE/SOB with significant hx of CAD: Reports worsening SOB over the last 1-2 months. Reports similar symptoms felt to be his anginal equivalent in the past. No overt chest pain. Plan TEE 12/27 at 10am and LHC to follow. Last cath with two culprit lesions mid OM1 and LCx/OM branch which were both stented. Continue Plavix. CBC, BMET today.    HTN: Stable with no changes needed at this time.    CKD stage IIIb: Last BMET 02/21/23  with Cr at 1.05. BMET today given upcoming cath 12/27.    HFpEF: Continue current Lasix dose of 80mg  AM. Appears euvolemic today. No edema.    Past Medical History:  Diagnosis Date   Anxiety    Arthritis    Atypical atrial flutter (HCC) 12/14/2014   Bradycardia, sinus 10/15/2014   Burning sensation of feet 01/17/2016   CAD, multiple vessel 10/18/2014   Overview:  DES to [facility] for unstable angina 10/17/14 Diagnostic Summary Severe stenosis of mid Cx likely cause of recent accelerating angina Diffuse severe stenosis of small caliber LAD unchanged from 2012 Normal LV function Paroxysmal SVT noted during procedure 04/23/16:  PCI / Resolute Drug Eluting Stent of the proximal Left Anterior Descending Coronary Artery. Successful PTCA of the ostial 1st Diagonal Coronary Artery   Chronic left-sided low back pain with sciatica 02/21/2016   Coronary artery disease 2003   s/p MI and numerous stents   Coronary artery disease involving native coronary artery of native heart with angina pectoris (HCC) 10/18/2014   Overview:  DES to [facility] for unstable angina 10/17/14 Diagnostic Summary Severe stenosis of mid Cx likely cause of recent accelerating angina Diffuse severe stenosis of small caliber LAD unchanged from 2012 Normal LV function Paroxysmal SVT noted during procedure 04/23/16:  PCI / Resolute Drug Eluting Stent of the proximal Left Anterior Descending Coronary Artery. 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   Degenerative spondylolisthesis 05/02/2017   Depression    Dyspnea    WITH EXERTION   Enlarged prostate without lower urinary tract symptoms (luts) 2012   frequent urination and nocturia; sees dr Oda Cogan in [place]   Essential hypertension 10/15/2014   Gastroesophageal reflux disease without esophagitis 04/21/2016   GERD (gastroesophageal reflux disease)    Hyperlipidemia    Hypertension    Hypertensive heart disease with heart failure (HCC)  12/20/2016   IHD (ischemic heart disease) 10/15/2014   Lower limb pain, inferior, right 12/17/2017   Lumbar radiculopathy 02/21/2016   Myocardial infarction [facility]) 2003   NSTEMI (non-ST elevated myocardial infarction) (HCC) 04/24/2016   Overview:  LHC 04/24/15: Severe stenosis of the LAD & Diagonal bifurcation  LV ejection fraction is 55-60 %  Interventional Summary Successful Angiosculpt PCI / Resolute Drug Eluting Stent of the proximal LAD Successful PTCA of the ostial 1st Diagonal Coronary Artery   Paroxysmal SVT (supraventricular tachycardia) (HCC) 11/16/2014   Precordial pain 10/15/2014   Presence of Watchman left atrial appendage closure device 02/20/2023   24mm Watchman FLX Pro with Dr. 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    Past Surgical History:  Procedure Laterality Date   BIOPSY  04/24/2020   Procedure: BIOPSY;  Surgeon: Beverley Fiedler, MD;  Location: [facility] ENDOSCOPY;  Service: Gastroenterology;;   CARDIAC CATHETERIZATION  10-2010   ptca/st[place] in [place]   CARDIAC CATHETERIZATION  04/23/2016   COLONOSCOPY WITH PROPOFOL N/A 04/24/2020   Procedure: COLONOSCOPY WITH PROPOFOL;  Surgeon: Beverley Fiedler, MD;  Location: [facility] ENDOSCOPY;  Service: Gastroenterology;  Laterality: N/A;   CORONARY ANGIOPLASTY     CORONARY STENT INTERVENTION N/A 08/10/2021   Procedure: CORONARY STENT INTERVENTION;  Surgeon: Marykay Lex, MD;  Location: [facility] INVASIVE CV LAB;  Service: Cardiovascular;  Laterality: N/A;   ESOPHAGOGASTRODUODENOSCOPY (EGD) WITH PROPOFOL N/A 04/24/2020   Procedure:  ESOPHAGOGASTRODUODENOSCOPY (EGD) WITH PROPOFOL;  Surgeon: Beverley Fiedler, MD;  Location: [facility] ENDOSCOPY;  Service: Gastroenterology;  Laterality: N/A;   EYE SURGERY  05-1010   bil cataract ,iol   FRACTURE SURGERY     HEMOSTASIS CONTROL  04/24/2020   Procedure: HEMOSTASIS CONTROL;  Surgeon: Beverley Fiedler, MD;  Location: [facility] ENDOSCOPY;  Service: Gastroenterology;;   I & D EXTREMITY Right 02/23/2015   Procedure: RIGHT RING FINGER REVISION AMPUTATION;  Surgeon: Betha Loa, MD;  Location: [facility] OR;  Service: Orthopedics;  Laterality: Right;   KNEE ARTHROSCOPY W/ MENISCAL REPAIR  2000   left knee   LEFT ATRIAL APPENDAGE OCCLUSION N/A 02/20/2023   Procedure: LEFT ATRIAL APPENDAGE OCCLUSION;  Surgeon: Lanier Prude, MD;  Location: [facility] INVASIVE CV LAB;  Service: Cardiovascular;  Laterality: N/A;   LEFT HEART CATH AND CORONARY ANGIOGRAPHY N/A 08/10/2021   Procedure: LEFT HEART CATH AND CORONARY ANGIOGRAPHY;  Surgeon: Marykay Lex, MD;  Location: [facility] INVASIVE CV LAB;  Service: Cardiovascular;  Laterality: N/A;   LUMBAR EPIDURAL INJECTION  2011   low back pain; DDD   POLYPECTOMY  04/24/2020   Procedure: POLYPECTOMY;  Surgeon: Beverley Fiedler, MD;  Location: [facility] ENDOSCOPY;  Service: Gastroenterology;;   REVERSE SHOULDER ARTHROPLASTY Left 11/23/2021   Procedure: REVERSE SHOULDER ARTHROPLASTY;  Surgeon: Beverely Low, MD;  Location: [facility] ORS;  Service: Orthopedics;  Laterality: Left;   ring finger tramatic qamputation Right    SHOULDER ARTHROSCOPY  03/29/2011   Procedure: ARTHROSCOPY SHOULDER;  Surgeon: Verlee Rossetti;  Location: [facility] OR;  Service: Orthopedics;  Laterality: Right;  RIGHT SHOULDER ARTHROSCOPY WITH OPEN SUBSCAPULAR REPAIR   SHOULDER OPEN ROTATOR CUFF REPAIR     3 on rt shoulder; 2 on left   TRANSESOPHAGEAL ECHOCARDIOGRAM (CATH LAB) N/A 02/20/2023   Procedure: TRANSESOPHAGEAL ECHOCARDIOGRAM;  Surgeon: Lanier Prude, MD;  Location: [facility] INVASIVE CV LAB;  Service: Cardiovascular;  Laterality: N/A;    TRANSFORAMINAL LUMBAR INTERBODY FUSION (TLIF) WITH PEDICLE SCREW FIXATION 1 LEVEL N/A 04/24/2017   Procedure: TRANSFORAMINAL LUMBAR INTERBODY FUSION (TLIF) L4-5;  Surgeon: Venita Lick, MD;  Location: [facility] OR;  Service: Orthopedics;  Laterality: N/A;  4 hrs    Current Medications: No outpatient medications have been marked as taking for the 04/28/23 encounter (Appointment) with [facility].     Allergies:   Amiodarone, Buprenorphine hcl, Codeine, Hydrocodone, Percocet [oxycodone-acetaminophen], and Sotalol hcl   Social History   Socioeconomic History   Marital status: Married    Spouse name: Not on file   Number of children: Not on file   Years of education: Not on file   Highest  education level: Not on file  Occupational History   Not on file  Tobacco Use   Smoking status: Former    Current packs/day: 0.00    Average packs/day: 1 pack/day for 20.0 years (20.0 ttl pk-yrs)    Types: Cigarettes    Start date: 03/20/1974    Quit date: 03/20/1994    Years since quitting: 29.0   Smokeless tobacco: Former    Types: Chew    Quit date: 03/20/1996  Vaping Use   Vaping status: Never Used  Substance and Sexual Activity   Alcohol use: Yes    Alcohol/week: 6.0 standard drinks of alcohol    Types: 6 Cans of beer per week    Comment: BEER- DAILY   Drug use: No   Sexual activity: Not on file  Other Topics Concern   Not on file  Social History Narrative   Not on file   Social Drivers of Health   Financial Resource Strain: Not on file  Food Insecurity: Not on file  Transportation Needs: Not on file  Physical Activity: Not on file  Stress: Not on file  Social Connections: Not on file     Family History: The patient's ***family history includes Cancer in his father and sister; Heart disease in his father and mother.  ROS:   Please see the history of present illness.    All other systems reviewed and are negative.  EKGs/Labs/Other Studies Reviewed:    The  following studies were reviewed today:   Cardiac Studies & Procedures   CARDIAC CATHETERIZATION  CARDIAC CATHETERIZATION 08/10/2021  Narrative   Prox RCA to Mid RCA lesion is 30% stenosed.   Previously placed Prox LAD to Mid LAD stent (unknown type) is  widely patent.  Jailed 1st Diag lesion is 60% stenosed.   Previously placed Prox Cx to Dist Cx stent (unknown type) is  widely patent.   Lesion #1: Dist Cx lesion is 90% stenosed.   A drug-eluting stent was successfully placed using a STENT ONYX FRONTIER 2.25X12.  Deployed at 2.35 mm and postdilated to 2.45 mm in the overlap segment.   Post intervention, there is a 0% residual stenosis.   Lesion #2: 1st Mrg lesion is 80% stenosed with 95% stenosed side branch in Lat 1st Mrg.   Mid LAD lesion is 55% stenosed.   Dist LAD lesion is 50% stenosed.   A drug-eluting stent was successfully placed using a STENT ONYX FRONTIER 2.25X1 -> deployed to 2.45 mm.   Post intervention, there is a 0% residual stenosis.  Post intervention, the side branch was reduced to 100% residual stenosis.  (Patient did have chest pain with stent placement, resolved with sublingual nitroglycerin and fentanyl/Versed.)  Multivessel CAD with 2 culprit lesions: Mid OM1 (85%) at small branch and distal LCx-OM2 (90%) just distal to prior stent.  Mild ISR in RCA stent and ostial diagonal (jailed) 60%.  Tandem 50 to 60% lesions in the mid LAD that are in hinge points.  Not likely flow-limiting. Successful two-vessel PCI of 2 culprit lesions reducing to 0% stenosis each treated with Onyx frontier DES 2.25 mm 12 mm stents dilated to 2.45 mm. Preserved LVEF with low normal roughly 50 to 55%.  Inadequate filling-therefore unable to truly estimate EF and wall motion.  Findings Coronary Findings Diagnostic  Dominance: Right  Left Anterior Descending Vessel is moderate in size. Previously placed Prox LAD to Mid LAD stent (unknown type) is  widely patent. Mid LAD lesion is 55%  stenosed. The lesion  is located at the bend. Dist LAD lesion is 50% stenosed. The lesion is located at the bend.  First Diagonal Branch Vessel is small in size. 1st Diag lesion is 60% stenosed. The lesion was previously treated using angioplasty . Rescue PTCA after stenting across  Second Diagonal Branch Vessel is small in size.  Third Diagonal Branch Vessel is small in size.  Left Circumflex Vessel is small. Previously placed Prox Cx to Dist Cx stent (unknown type) is  widely patent. Dist Cx lesion is 90% stenosed. The lesion is distal to major branch, focal, concentric and irregular. Distal to the previous stent  First Obtuse Marginal Branch Vessel is small in size. 1st Mrg lesion is 80% stenosed with 95% stenosed side branch in Lat 1st Mrg. The lesion is eccentric and ulcerative. At very small sidebranch The lesion was not previously treated .  Left Posterior Atrioventricular Artery Vessel is small in size.  Right Coronary Artery Prox RCA to Mid RCA lesion is 30% stenosed. The lesion was previously treated .  Intervention  Prox Cx to Dist Cx lesion Stent (Also treats lesions: Dist Cx) Lesion length:  8 mm. CATH VISTA GUIDE 6FR XB3.5 guide catheter was inserted. Lesion crossed with guidewire using a WIRE ASAHI PROWATER 180CM. Pre-stent angioplasty was performed using a BALLN SAPPHIRE 2.25X10. Maximum pressure:  10 atm. Inflation time: 20 sec. A drug-eluting stent was successfully placed using a STENT ONYX FRONTIER 2.25X12. Maximum pressure: 14 atm. Inflation time: 30 sec. Stent strut is well apposed. Stent balloon pulled back to the overlapping segment and inflated to high ATM Deployed to 2.35 mm, but that the overlap site dilated to 2.45 mm. Post-stent angioplasty was not performed. Stent balloon at high ATM for overlap-18 ATM Downstream lesion covered with a overlapping stent, overlapping segment postdilated with stent balloon to high ATM. Post-Intervention Lesion Assessment The  intervention was successful. Pre-interventional TIMI flow is 3. Post-intervention TIMI flow is 3. There is a 0% residual stenosis post intervention.  Dist Cx lesion Stent (Also treats lesions: Prox Cx to Dist Cx) See details in Prox Cx to Dist Cx lesion. Post-Intervention Lesion Assessment The intervention was successful. Pre-interventional TIMI flow is 3. Post-intervention TIMI flow is 3. Treated lesion length:  10 mm. No complications occurred at this lesion. There is a 0% residual stenosis post intervention.  1st Mrg lesion with side branch in Lat 1st Mrg Stent - Main Branch Lesion length:  10 mm. CATH VISTA GUIDE 6FR XB3.5 guide catheter was inserted. Lesion crossed with guidewire using a WIRE ASAHI PROWATER 180CM. Pre-stent angioplasty was performed using a BALLN SAPPHIRE 2.25X10. Maximum pressure:  10 atm. Inflation time:  20 sec. A drug-eluting stent was successfully placed using a STENT ONYX FRONTIER 2.25X12. Maximum pressure: 16 atm. Inflation time: 30 sec. Stent strut is well apposed. Deployed to 2.45 mm Post-stent angioplasty was not performed. Same P[place]ater wire was read directed after PCI of the distal LCx into the OM1 Post-Intervention Lesion Assessment The intervention was successful. Pre-interventional TIMI flow is 3. Post-intervention TIMI flow is 3. Treated lesion length:  12 mm. Very small sidebranch occluded => 4 out of 10 chest pain, resolved with sublingual nitroglycerin and second round of fentanyl and Versed. There is a 0% residual stenosis in the main branch post intervention. There is a 100% residual stenosis in the side branch post intervention.   STRESS TESTS  MYOCARDIAL PERFUSION IMAGING 03/25/2019  Narrative  There was no ST segment deviation noted during stress.  No T wave  inversion was noted during stress.  Nongated study due to atrial fibrillation.  The study is normal.  This is a low risk study.   ECHOCARDIOGRAM  ECHOCARDIOGRAM COMPLETE  01/29/2022  Narrative ECHOCARDIOGRAM REPORT    Patient Name:   Andrew Hardy. Date of Exam: 01/29/2022 Medical Rec #:  [id number]       Height:       66.0 in Accession #:    [id number]      Weight:       204.8 lb Date of Birth:  09-01-1940       BSA:          2.020 m Patient Age:    65 years        BP:           94/60 mmHg Patient Gender: M               HR:           92 bpm. Exam Location:  [facility]  Procedure: 2D Echo, 3D Echo, Cardiac Doppler, Color Doppler and Strain Analysis  Indications:    Coronary artery disease involving native coronary artery of native heart with angina pectoris (HCC) [I25.119 (ICD-10-CM)]; Persistent atrial fibrillation (HCC) [I48.19 (ICD-10-CM)]; Chronic anticoagulation [Z79.01 (ICD-10-CM)]; High risk medication use [Z79.899 (ICD-10-CM)]; Hypertensive heart disease with heart failure (HCC) [I11.0 (ICD-10-CM)]; Mixed hyperlipidemia [E78.2 (ICD-10-CM)]; SOB (shortness of breath) on exertion [R06.02 (ICD-10-CM)]  History:        Patient has prior history of Echocardiogram examinations, most recent 05/04/2019. CAD and Previous Myocardial Infarction, Arrythmias:Atrial Fibrillation, SVT and PVC; Risk Factors:Dyslipidemia and Hypertension.  Sonographer:    Margreta Journey RDCS Referring Phys: [id number] BRIAN J MUNLEY  IMPRESSIONS   1. Left ventricular ejection fraction, by estimation, is 50 to 55%. Left ventricular ejection fraction by 3D volume is 53 %. The left ventricle has low normal function. The left ventricle has no regional wall motion abnormalities. Left ventricular diastolic parameters are indeterminate. The average left ventricular global longitudinal strain is -12.8 %. The global longitudinal strain is abnormal. 2. Right ventricular systolic function is normal. The right ventricular size is normal. There is normal pulmonary artery systolic pressure. 3. Left atrial size was mildly dilated. 4. The mitral valve is degenerative. Mild mitral valve  regurgitation. No evidence of mitral stenosis. 5. The aortic valve is tricuspid. Aortic valve regurgitation is mild. Aortic valve sclerosis is present, with no evidence of aortic valve stenosis. 6. Aortic normal DTA. 7. The inferior vena cava is dilated in size with >50% respiratory variability, suggesting right atrial pressure of 8 mmHg.  FINDINGS Left Ventricle: Left ventricular ejection fraction, by estimation, is 50 to 55%. Left ventricular ejection fraction by 3D volume is 53 %. The left ventricle has low normal function. The left ventricle has no regional wall motion abnormalities. The average left ventricular global longitudinal strain is -12.8 %. The global longitudinal strain is abnormal. The left ventricular internal cavity size was normal in size. There is no left ventricular hypertrophy. Left ventricular diastolic parameters are indeterminate.  Right Ventricle: The right ventricular size is normal. No increase in right ventricular wall thickness. Right ventricular systolic function is normal. There is normal pulmonary artery systolic pressure. The tricuspid regurgitant velocity is 2.47 m/s, and with an assumed right atrial pressure of 8 mmHg, the estimated right ventricular systolic pressure is 32.4 mmHg.  Left Atrium: Left atrial size was mildly dilated.  Right Atrium: Right atrial size was normal in size.  Pericardium:  There is no evidence of pericardial effusion.  Mitral Valve: The mitral valve is degenerative in appearance. Mild mitral annular calcification. Mild mitral valve regurgitation. No evidence of mitral valve stenosis.  Tricuspid Valve: The tricuspid valve is normal in structure. Tricuspid valve regurgitation is mild . No evidence of tricuspid stenosis.  Aortic Valve: The aortic valve is tricuspid. Aortic valve regurgitation is mild. Aortic regurgitation PHT measures 724 msec. Aortic valve sclerosis is present, with no evidence of aortic valve stenosis.  Pulmonic  Valve: The pulmonic valve was normal in structure. Pulmonic valve regurgitation is not visualized. No evidence of pulmonic stenosis.  Aorta: The aortic root and ascending aorta are structurally normal, with no evidence of dilitation, normal DTA and the aortic arch was not well visualized.  Venous: A systolic blunting flow pattern is recorded from the right upper pulmonary vein. The inferior vena cava is dilated in size with greater than 50% respiratory variability, suggesting right atrial pressure of 8 mmHg.  IAS/Shunts: No atrial level shunt detected by color flow Doppler.   LEFT VENTRICLE PLAX 2D LVIDd:         4.20 cm         Diastology LVIDs:         3.00 cm         LV e' medial:    8.38 cm/s LV PW:         1.10 cm         LV E/e' medial:  11.9 LV IVS:        1.20 cm         LV e' lateral:   12.60 cm/s LVOT diam:     2.00 cm         LV E/e' lateral: 7.9 LV SV:         48 LV SV Index:   24              2D LVOT Area:     3.14 cm        Longitudinal Strain 2D Strain GLS  -12.8 % LV Volumes (MOD)               Avg: LV vol d, MOD    74.4 ml A2C:                           3D Volume EF LV vol d, MOD    109.0 ml      LV 3D EF:    Left A4C:                                        ventricul LV vol s, MOD    33.3 ml                    ar A2C:                                        ejection LV vol s, MOD    50.0 ml                    fraction A4C:  by 3D LV SV MOD A2C:   41.1 ml                    volume is LV SV MOD A4C:   109.0 ml                   53 %. LV SV MOD BP:    52.7 ml  3D Volume EF: 3D EF:        53 % LV EDV:       118 ml LV ESV:       56 ml LV SV:        62 ml  RIGHT VENTRICLE             IVC RV Basal diam:  3.00 cm     IVC diam: 2.50 cm RV S prime:     13.80 cm/s TAPSE (M-mode): 1.6 cm  LEFT ATRIUM             Index        RIGHT ATRIUM           Index LA diam:        4.10 cm 2.03 cm/m   RA Area:     17.30 cm LA Vol (A2C):    75.8 ml 37.52 ml/m  RA Volume:   37.00 ml  18.32 ml/m LA Vol (A4C):   63.7 ml 31.53 ml/m LA Biplane Vol: 70.6 ml 34.95 ml/m AORTIC VALVE LVOT Vmax:   87.70 cm/s LVOT Vmean:  62.500 cm/s LVOT VTI:    0.152 m AI PHT:      724 msec  AORTA Ao Root diam: 3.10 cm Ao Asc diam:  3.40 cm Ao Desc diam: 2.30 cm  MITRAL VALVE               TRICUSPID VALVE MV Area (PHT): 3.53 cm    TR Peak grad:   24.4 mmHg MV Decel Time: 215 msec    TR Vmax:        247.00 cm/s MR Peak grad: 50.7 mmHg MR Vmax:      356.00 cm/s  SHUNTS MV E velocity: 99.80 cm/s  Systemic VTI:  0.15 m Systemic Diam: 2.00 cm  Norman Herrlich MD Electronically signed by Norman Herrlich MD Signature Date/Time: 01/29/2022/12:40:53 PM    Final   TEE  ECHO TEE 02/20/2023  Narrative TRANSESOPHOGEAL ECHO REPORT    Patient Name:   Andrew Hardy. Date of Exam: 02/20/2023 Medical Rec #:  [id number]       Height:       67.0 in Accession #:    [id number]      Weight:       214.8 lb Date of Birth:  [birth year]       BSA:          2.085 m Patient Age:    82 years        BP:           118/76 mmHg Patient Gender: M               HR:           90 bpm. Exam Location:  Inpatient  Procedure: Transesophageal Echo, Color Doppler and 3D Echo  Indications:     Watchman  History:         Patient has prior history of Echocardiogram examinations, most recent 01/29/2022. CAD, Arrythmias:Atrial Flutter; Risk Factors:Hypertension.  Sonographer:     Remi Deter  Clark Referring Phys:  [id number] Lanier Prude Diagnosing Phys: Thurmon Fair MD  PROCEDURE: After discussion of the risks and benefits of a TEE, an informed consent was obtained. The transesophogeal probe was passed without difficulty through the esophogus of the patient. Sedation performed by different physician. The patient developed no complications during the procedure.  Transesophageal echocardiography, including live and postprocessed 3D imaging was used to assist with  device selection, transseptal puncture, device deployment, monitoring for complications and assessment of the final result.  IMPRESSIONS   1. Left ventricular ejection fraction, by estimation, is 55 to 60%. The left ventricle has normal function. The left ventricle has no regional wall motion abnormalities. Left ventricular diastolic function could not be evaluated. 2. Right ventricular systolic function is normal. The right ventricular size is normal. Tricuspid regurgitation signal is inadequate for assessing PA pressure. The estimated right ventricular systolic pressure is 23.3 mmHg. 3. Left atrial size was severely dilated. No left atrial/left atrial appendage thrombus was detected. 4. Evidence of atrial level shunting detected by color flow Doppler. 5. Right atrial size was mildly dilated. 6. The mitral valve is normal in structure. Trivial mitral valve regurgitation. 7. The aortic valve is tricuspid. There is mild calcification of the aortic valve. Aortic valve regurgitation is mild. Aortic valve sclerosis is present, with no evidence of aortic valve stenosis. 8. There is mild (Grade II) layered plaque.  FINDINGS Left Ventricle: Left ventricular ejection fraction, by estimation, is 55 to 60%. The left ventricle has normal function. The left ventricle has no regional wall motion abnormalities. The left ventricular internal cavity size was normal in size. There is no left ventricular hypertrophy. Left ventricular diastolic function could not be evaluated due to atrial fibrillation. Left ventricular diastolic function could not be evaluated.  Right Ventricle: The right ventricular size is normal. No increase in right ventricular wall thickness. Right ventricular systolic function is normal. Tricuspid regurgitation signal is inadequate for assessing PA pressure. The tricuspid regurgitant velocity is 2.14 m/s, and with an assumed right atrial pressure of 5 mmHg, the estimated right ventricular  systolic pressure is 23.3 mmHg.  Left Atrium: Left atrial size was severely dilated. No left atrial/left atrial appendage thrombus was detected.  Right Atrium: Right atrial size was mildly dilated.  Pericardium: Trivial pericardial effusion is present.  Mitral Valve: The mitral valve is normal in structure. Trivial mitral valve regurgitation.  Tricuspid Valve: The tricuspid valve is normal in structure. Tricuspid valve regurgitation is mild.  Aortic Valve: The aortic valve is tricuspid. There is mild calcification of the aortic valve. Aortic valve regurgitation is mild. Aortic valve sclerosis is present, with no evidence of aortic valve stenosis.  Pulmonic Valve: The pulmonic valve was normal in structure. Pulmonic valve regurgitation is trivial. No evidence of pulmonic stenosis.  Aorta: The aortic root, ascending aorta, aortic arch and descending aorta are all structurally normal, with no evidence of dilitation or obstruction. There is mild (Grade II) layered plaque.  IAS/Shunts: The interatrial septum appears to be lipomatous. Evidence of atrial level shunting detected by color flow Doppler. There is no evidence of interatrial shunting at the beginning of the procedure. 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   TRICUSPID VALVE TR Peak grad:   18.3 mmHg TR Vmax:        214.00 cm/s  Thurmon Fair MD Electronically signed by Thurmon Fair MD Signature Date/Time: 02/20/2023/2:55:20 PM    Final  MONITORS  LONG TERM MONITOR (3-14 DAYS) 01/29/2022  Narrative Patch Wear Time:  7 days and 10 hours (2023-09-25T08:59:54-0400 to 2023-10-02T19:21:24-0400)  Atrial Fibrillation occurred continuously (100% burden), ranging from 57-177 bpm (avg of 96 bpm). Bundle Branch Block/IVCD was present.  There were no pauses of 3 seconds or greater  Isolated VEs were rare (<1.0%), VE Couplets were rare (<1.0%),  and no VE Triplets were present. Ventricular Bigeminy was present.  Heart rate was greater than 110 bpm 26% of the time daytime 2% of the time nighttime.  Her 48 triggered events all associated with atrial fibrillation generally rapid rates greater than 110 bpm and 1 episode with ventricular bigeminy.            EKG:  EKG is *** ordered today.  The ekg ordered today demonstrates ***  Recent Labs: 08/13/2022: ALT 10 03/31/2023: BUN 18; Creatinine, Ser 1.20; Hemoglobin 12.2; Platelets 272; Potassium 4.4; Sodium 141  Recent Lipid Panel    Component Value Date/Time   CHOL 136 08/13/2022 0900   TRIG 146 08/13/2022 0900   HDL 54 08/13/2022 0900   CHOLHDL 2.5 08/13/2022 0900   LDLCALC 57 08/13/2022 0900     Risk Assessment/Calculations:   {Does this patient have ATRIAL FIBRILLATION?[phone]}   CHA2DS2-VASc Score = 5 [CHF History: 1, HTN History: 1, Diabetes History: 0, Stroke History: 0, Vascular Disease History: 1, Age Score: 2, Gender Score: 0].  Therefore, the patient's annual risk of stroke is 7.2 %.        HAS-BLED score *** Hypertension (Uncontrolled in 30 days)  {YES/NO:21197} Abnormal renal and liver function (Dialysis, transplant, Cr >2.26 mg/dL /Cirrhosis or Bilirubin >2x Normal or AST/ALT/AP >3x Normal) {YES/NO:21197} Stroke {YES/NO:21197} Bleeding {YES/NO:21197} Labile INR (Unstable/high INR) {YES/NO:21197} Elderly (>65) {YES/NO:21197} Drugs or alcohol (>= 8 drinks/week, anti-plt or NSAID) {YES/NO:21197}   Physical Exam:    VS:  There were no vitals taken for this visit.    Wt Readings from Last 3 Encounters:  03/31/23 215 lb (97.5 kg)  02/20/23 214 lb (97.1 kg)  02/19/23 214 lb 12.8 oz (97.4 kg)     GEN: *** Well nourished, well developed in no acute distress HEENT: Normal NECK: No JVD; No carotid bruits LYMPHATICS: No lymphadenopathy CARDIAC: ***RRR, no murmurs, rubs, gallops RESPIRATORY:  Clear to auscultation without rales, wheezing or rhonchi   ABDOMEN: Soft, non-tender, non-distended MUSCULOSKELETAL:  No edema; No deformity  SKIN: Warm and dry NEUROLOGIC:  Alert and oriented x 3 PSYCHIATRIC:  Normal affect   ASSESSMENT:    No diagnosis found. PLAN:    In order of problems listed above:     I spent *** minutes caring for this patient today including face-to-face discussions, ordering and reviewing labs, reviewing records from [facility] and other outside facilities, documenting in the record, and arranging for follow up.       {Are you ordering a CV Procedure (e.g. stress test, cath, DCCV, TEE, etc)?   Press F2        :[id number]}    Medication Adjustments/Labs and Tests Ordered: Current medicines are reviewed at length with the patient today.  Concerns regarding medicines are outlined above.  No orders of the defined types were placed in this encounter.  No orders of the defined types were placed in this encounter.   There are no Patient Instructions on file for this visit.   Signed, Georgie Chard, NP  04/17/2023 1:14 PM    [facility]

## 2023-04-17 NOTE — Progress Notes (Signed)
 Spoke to patient and instructed them to come at 0830  and to be NPO after 0000.  Medications reviewed.    Confirmed that patient will have a ride home and someone to stay with them for 24 hours after the procedure.

## 2023-04-18 ENCOUNTER — Other Ambulatory Visit: Payer: Self-pay

## 2023-04-18 ENCOUNTER — Encounter (HOSPITAL_COMMUNITY): Payer: Self-pay | Admitting: Internal Medicine

## 2023-04-18 ENCOUNTER — Encounter (HOSPITAL_COMMUNITY)
Admission: RE | Disposition: A | Discharge status: Home / Self Care | Payer: Self-pay | Source: Home / Self Care | Attending: Internal Medicine

## 2023-04-18 ENCOUNTER — Ambulatory Visit (HOSPITAL_COMMUNITY)
Admission: RE | Admit: 2023-04-18 | Discharge: 2023-04-18 | Disposition: A | Discharge status: Home / Self Care | Payer: PPO | Attending: Internal Medicine | Admitting: Internal Medicine

## 2023-04-18 ENCOUNTER — Ambulatory Visit (HOSPITAL_COMMUNITY): Payer: PPO | Admitting: Anesthesiology

## 2023-04-18 ENCOUNTER — Ambulatory Visit (HOSPITAL_COMMUNITY)
Admission: RE | Admit: 2023-04-18 | Discharge: 2023-04-18 | Disposition: A | Discharge status: Home / Self Care | Payer: PPO | Source: Ambulatory Visit | Attending: Cardiology | Admitting: Cardiology

## 2023-04-18 DIAGNOSIS — R0609 Other forms of dyspnea: Secondary | ICD-10-CM | POA: Diagnosis not present

## 2023-04-18 DIAGNOSIS — Z955 Presence of coronary angioplasty implant and graft: Secondary | ICD-10-CM | POA: Diagnosis not present

## 2023-04-18 DIAGNOSIS — N1832 Chronic kidney disease, stage 3b: Secondary | ICD-10-CM | POA: Insufficient documentation

## 2023-04-18 DIAGNOSIS — Z7901 Long term (current) use of anticoagulants: Secondary | ICD-10-CM | POA: Insufficient documentation

## 2023-04-18 DIAGNOSIS — I110 Hypertensive heart disease with heart failure: Secondary | ICD-10-CM | POA: Diagnosis not present

## 2023-04-18 DIAGNOSIS — Z8249 Family history of ischemic heart disease and other diseases of the circulatory system: Secondary | ICD-10-CM | POA: Diagnosis not present

## 2023-04-18 DIAGNOSIS — I340 Nonrheumatic mitral (valve) insufficiency: Secondary | ICD-10-CM

## 2023-04-18 DIAGNOSIS — I4891 Unspecified atrial fibrillation: Secondary | ICD-10-CM | POA: Diagnosis not present

## 2023-04-18 DIAGNOSIS — I5032 Chronic diastolic (congestive) heart failure: Secondary | ICD-10-CM | POA: Insufficient documentation

## 2023-04-18 DIAGNOSIS — I700 Atherosclerosis of aorta: Secondary | ICD-10-CM | POA: Insufficient documentation

## 2023-04-18 DIAGNOSIS — I4821 Permanent atrial fibrillation: Secondary | ICD-10-CM

## 2023-04-18 DIAGNOSIS — I351 Nonrheumatic aortic (valve) insufficiency: Secondary | ICD-10-CM | POA: Diagnosis not present

## 2023-04-18 DIAGNOSIS — I083 Combined rheumatic disorders of mitral, aortic and tricuspid valves: Secondary | ICD-10-CM | POA: Diagnosis not present

## 2023-04-18 DIAGNOSIS — I130 Hypertensive heart and chronic kidney disease with heart failure and stage 1 through stage 4 chronic kidney disease, or unspecified chronic kidney disease: Secondary | ICD-10-CM | POA: Insufficient documentation

## 2023-04-18 DIAGNOSIS — I5022 Chronic systolic (congestive) heart failure: Secondary | ICD-10-CM | POA: Diagnosis not present

## 2023-04-18 DIAGNOSIS — I2510 Atherosclerotic heart disease of native coronary artery without angina pectoris: Secondary | ICD-10-CM | POA: Insufficient documentation

## 2023-04-18 DIAGNOSIS — Z95818 Presence of other cardiac implants and grafts: Secondary | ICD-10-CM

## 2023-04-18 DIAGNOSIS — Z7902 Long term (current) use of antithrombotics/antiplatelets: Secondary | ICD-10-CM | POA: Insufficient documentation

## 2023-04-18 DIAGNOSIS — Z87891 Personal history of nicotine dependence: Secondary | ICD-10-CM | POA: Diagnosis not present

## 2023-04-18 DIAGNOSIS — K922 Gastrointestinal hemorrhage, unspecified: Secondary | ICD-10-CM

## 2023-04-18 HISTORY — PX: TRANSESOPHAGEAL ECHOCARDIOGRAM (CATH LAB): EP1270

## 2023-04-18 HISTORY — PX: LEFT HEART CATH AND CORONARY ANGIOGRAPHY: CATH118249

## 2023-04-18 LAB — ECHO TEE

## 2023-04-18 SURGERY — LEFT HEART CATH AND CORONARY ANGIOGRAPHY
Anesthesia: LOCAL

## 2023-04-18 SURGERY — TRANSESOPHAGEAL ECHOCARDIOGRAM (TEE) (CATHLAB)
Anesthesia: Monitor Anesthesia Care

## 2023-04-18 MED ORDER — SODIUM CHLORIDE 0.9 % IV SOLN: 250.0000 mL | INTRAVENOUS | Status: DC | PRN

## 2023-04-18 MED ORDER — LIDOCAINE 2% (20 MG/ML) 5 ML SYRINGE
INTRAMUSCULAR | Status: DC | PRN
  Administered 2023-04-18: 60 mg via INTRAVENOUS

## 2023-04-18 MED ORDER — MIDAZOLAM HCL 2 MG/2ML IJ SOLN
INTRAMUSCULAR | Status: AC
  Filled 2023-04-18: qty 2

## 2023-04-18 MED ORDER — LIDOCAINE HCL (PF) 1 % IJ SOLN
INTRAMUSCULAR | Status: DC | PRN
  Administered 2023-04-18: 2 mL

## 2023-04-18 MED ORDER — MIDAZOLAM HCL 2 MG/2ML IJ SOLN
INTRAMUSCULAR | Status: DC | PRN
  Administered 2023-04-18: 1 mg via INTRAVENOUS

## 2023-04-18 MED ORDER — PHENYLEPHRINE HCL (PRESSORS) 10 MG/ML IV SOLN
INTRAVENOUS | Status: DC | PRN
  Administered 2023-04-18: 80 ug via INTRAVENOUS
  Administered 2023-04-18: 160 ug via INTRAVENOUS

## 2023-04-18 MED ORDER — EPHEDRINE SULFATE (PRESSORS) 50 MG/ML IJ SOLN
INTRAMUSCULAR | Status: DC | PRN
  Administered 2023-04-18 (×2): 10 mg via INTRAVENOUS

## 2023-04-18 MED ORDER — SODIUM CHLORIDE 0.9% FLUSH: 3.0000 mL | INTRAVENOUS | Status: DC | PRN

## 2023-04-18 MED ORDER — IOHEXOL 350 MG/ML SOLN
INTRAVENOUS | Status: DC | PRN
  Administered 2023-04-18: 45 mL

## 2023-04-18 MED ORDER — LIDOCAINE HCL (PF) 1 % IJ SOLN
INTRAMUSCULAR | Status: AC
  Filled 2023-04-18: qty 30

## 2023-04-18 MED ORDER — HEPARIN (PORCINE) IN NACL 1000-0.9 UT/500ML-% IV SOLN
INTRAVENOUS | Status: DC | PRN
  Administered 2023-04-18 (×2): 500 mL

## 2023-04-18 MED ORDER — VERAPAMIL HCL 2.5 MG/ML IV SOLN
INTRAVENOUS | Status: AC
  Filled 2023-04-18: qty 2

## 2023-04-18 MED ORDER — HEPARIN SODIUM (PORCINE) 1000 UNIT/ML IJ SOLN
INTRAMUSCULAR | Status: DC | PRN
  Administered 2023-04-18: 5000 [IU] via INTRAVENOUS

## 2023-04-18 MED ORDER — ONDANSETRON HCL 4 MG/2ML IJ SOLN: 4.0000 mg | Freq: Four times a day (QID) | INTRAMUSCULAR | Status: DC | PRN

## 2023-04-18 MED ORDER — PROPOFOL 10 MG/ML IV BOLUS
INTRAVENOUS | Status: DC | PRN
  Administered 2023-04-18 (×4): 50 mg via INTRAVENOUS

## 2023-04-18 MED ORDER — ASPIRIN 81 MG PO CHEW: 81.0000 mg | CHEWABLE_TABLET | ORAL | Status: DC

## 2023-04-18 MED ORDER — FENTANYL CITRATE (PF) 100 MCG/2ML IJ SOLN
INTRAMUSCULAR | Status: DC | PRN
  Administered 2023-04-18: 25 ug via INTRAVENOUS

## 2023-04-18 MED ORDER — SODIUM CHLORIDE 0.9 % WEIGHT BASED INFUSION: 3.0000 mL/kg/h | INTRAVENOUS | Status: AC

## 2023-04-18 MED ORDER — FENTANYL CITRATE (PF) 100 MCG/2ML IJ SOLN
INTRAMUSCULAR | Status: AC
  Filled 2023-04-18: qty 2

## 2023-04-18 MED ORDER — SODIUM CHLORIDE 0.9% FLUSH: 10.0000 mL | Freq: Two times a day (BID) | INTRAVENOUS | Status: DC

## 2023-04-18 MED ORDER — HYDRALAZINE HCL 20 MG/ML IJ SOLN: 10.0000 mg | INTRAMUSCULAR | Status: DC | PRN

## 2023-04-18 MED ORDER — SODIUM CHLORIDE 0.9 % WEIGHT BASED INFUSION
1.0000 mL/kg/h | INTRAVENOUS | Status: DC
  Administered 2023-04-18: 25 mL/h via INTRAVENOUS

## 2023-04-18 MED ORDER — VERAPAMIL HCL 2.5 MG/ML IV SOLN
INTRAVENOUS | Status: DC | PRN
  Administered 2023-04-18: 10 mL via INTRA_ARTERIAL

## 2023-04-18 MED ORDER — SODIUM CHLORIDE 0.9% FLUSH: 3.0000 mL | Freq: Two times a day (BID) | INTRAVENOUS | Status: DC

## 2023-04-18 MED ORDER — ACETAMINOPHEN 325 MG PO TABS
650.0000 mg | ORAL_TABLET | ORAL | Status: DC | PRN
Start: 2023-04-18 — End: 2023-04-18

## 2023-04-18 SURGICAL SUPPLY — 8 items
CATH 5FR JL3.5 JR4 ANG PIG MP (CATHETERS) IMPLANT
DEVICE RAD COMP TR BAND LRG (VASCULAR PRODUCTS) IMPLANT
GLIDESHEATH SLEND SS 6F .021 (SHEATH) IMPLANT
GUIDEWIRE INQWIRE 1.5J.035X260 (WIRE) IMPLANT
INQWIRE 1.5J .035X260CM (WIRE) ×1
PACK CARDIAC CATHETERIZATION (CUSTOM PROCEDURE TRAY) ×1 IMPLANT
SET ATX-X65L (MISCELLANEOUS) IMPLANT
SHEATH PROBE COVER 6X72 (BAG) IMPLANT

## 2023-04-18 NOTE — Progress Notes (Signed)
Pt ambulated without difficulty or bleeding.  Discharge instructions given to pt and wife  who verbalize understanding and deny further questions.  Discharged home with  wife who will drive and stay with pt x 24 hrs.

## 2023-04-18 NOTE — Transfer of Care (Signed)
Immediate Anesthesia Transfer of Care Note  Patient: Garang Wilbur.  Procedure(s) Performed: TRANSESOPHAGEAL ECHOCARDIOGRAM  Patient Location:  cv  Anesthesia Type:MAC  Level of Consciousness: drowsy and patient cooperative  Airway & Oxygen Therapy: Patient Spontanous Breathing and Patient connected to nasal cannula oxygen  Post-op Assessment: Report given to RN and Post -op Vital signs reviewed and stable  Post vital signs: Reviewed and stable81/70  Last Vitals:  Vitals Value Taken Time  BP 88/70 0950  Temp    Pulse 112 0950  Resp 16 0950  SpO2 100 0950    Last Pain:  Vitals:   04/18/23 0831  TempSrc:   PainSc: 0-No pain         Complications: No notable events documented.

## 2023-04-18 NOTE — Discharge Instructions (Signed)

## 2023-04-18 NOTE — Interval H&P Note (Signed)
History and Physical Interval Note:  04/18/2023 9:18 AM  Andrew Hardy.  has presented today for surgery, with the diagnosis of POST WATCHMAN EVALUATION.  The various methods of treatment have been discussed with the patient and family. After consideration of risks, benefits and other options for treatment, the patient has consented to  Procedure(s): TRANSESOPHAGEAL ECHOCARDIOGRAM (N/A) as a surgical intervention.  Also Consented for left heart catheterization, as he is planned for this post TEE. The patient's history has been reviewed, patient examined, no change in status, stable for surgery.  I have reviewed the patient's chart and labs.  Questions were answered to the patient's satisfaction.     Lunden Mcleish A Areanna Gengler

## 2023-04-18 NOTE — CV Procedure (Signed)
    TRANSESOPHAGEAL ECHOCARDIOGRAM   NAME:  Andrew Hardy.    MRN: [id number] DOB:  04/02/1941    ADMIT DATE: 04/18/2023  INDICATIONS:   PROCEDURE:   Informed consent was obtained prior to the procedure. The risks, benefits and alternatives for the procedure were discussed and the patient comprehended these risks.  Risks include, but are not limited to, cough, sore throat, vomiting, nausea, somnolence, esophageal and stomach trauma or perforation, bleeding, low blood pressure, aspiration, pneumonia, infection, trauma to the teeth and death.    Procedural time out performed. The oropharynx was anesthetized with topical 1% benzocaine.    Anesthesia was administered by Dr. Tacy Dura and team.  The patient's heart rate, blood pressure, and oxygen saturation are monitored continuously during the procedure. The transesophageal probe was inserted in the esophagus and stomach without difficulty and multiple views were obtained.   COMPLICATIONS:    There were no immediate complications.  KEY FINDINGS:  Successful Watchman FLX placement- no thrombus or perivalvular leak.  Full report to follow. Further management per primary team.   Riley Lam, MD [place]  [facility]  9:51 AM

## 2023-04-18 NOTE — Anesthesia Preprocedure Evaluation (Signed)
Anesthesia Evaluation  Patient identified by MRN, date of birth, ID band Patient awake    Reviewed: Allergy & Precautions, NPO status , Patient's Chart, lab work & pertinent test results, reviewed documented beta blocker date and time   Airway Mallampati: III  TM Distance: >3 FB Neck ROM: Full    Dental  (+) Teeth Intact, Dental Advisory Given   Pulmonary former smoker   Pulmonary exam normal breath sounds clear to auscultation       Cardiovascular hypertension, Pt. on medications and Pt. on home beta blockers + CAD, + Past MI, + Cardiac Stents and +CHF  + dysrhythmias Atrial Fibrillation and Supra Ventricular Tachycardia  Rhythm:Irregular Rate:Abnormal  Echo 01/29/22: 1. Left ventricular ejection fraction, by estimation, is 50 to 55%. Left  ventricular ejection fraction by 3D volume is 53 %. The left ventricle has  low normal function. The left ventricle has no regional wall motion  abnormalities. Left ventricular  diastolic parameters are indeterminate. The average left ventricular  global longitudinal strain is -12.8 %. The global longitudinal strain is  abnormal.   2. Right ventricular systolic function is normal. The right ventricular  size is normal. There is normal pulmonary artery systolic pressure.   3. Left atrial size was mildly dilated.   4. The mitral valve is degenerative. Mild mitral valve regurgitation. No  evidence of mitral stenosis.   5. The aortic valve is tricuspid. Aortic valve regurgitation is mild.  Aortic valve sclerosis is present, with no evidence of aortic valve  stenosis.   6. Aortic normal DTA.   7. The inferior vena cava is dilated in size with >50% respiratory  variability, suggesting right atrial pressure of 8 mmHg.     Neuro/Psych  PSYCHIATRIC DISORDERS Anxiety Depression     Neuromuscular disease    GI/Hepatic Neg liver ROS,GERD  Medicated,,  Endo/Other  Obesity   Renal/GU Renal  disease     Musculoskeletal  (+) Arthritis ,    Abdominal   Peds  Hematology  (+) Blood dyscrasia (Plavix, Xarelto), anemia   Anesthesia Other Findings Day of surgery medications reviewed with the patient.  Reproductive/Obstetrics                             Anesthesia Physical Anesthesia Plan  ASA: 3  Anesthesia Plan: MAC   Post-op Pain Management:    Induction: Intravenous  PONV Risk Score and Plan: 2 and Propofol infusion  Airway Management Planned: Natural Airway and Simple Face Mask  Additional Equipment:   Intra-op Plan:   Post-operative Plan: Extubation in OR  Informed Consent: I have reviewed the patients History and Physical, chart, labs and discussed the procedure including the risks, benefits and alternatives for the proposed anesthesia with the patient or authorized representative who has indicated his/her understanding and acceptance.     Dental advisory given  Plan Discussed with: CRNA and Anesthesiologist  Anesthesia Plan Comments:         Anesthesia Quick Evaluation

## 2023-04-18 NOTE — Progress Notes (Signed)
Report given to Lisa

## 2023-04-18 NOTE — Interval H&P Note (Signed)
History and Physical Interval Note:  04/18/2023 12:06 PM  Andrew Hardy.  has presented today for surgery, with the diagnosis of shortness of breath.  The various methods of treatment have been discussed with the patient and family. After consideration of risks, benefits and other options for treatment, the patient has consented to  Procedure(s): LEFT HEART CATH AND CORONARY ANGIOGRAPHY (N/A) as a surgical intervention.  The patient's history has been reviewed, patient examined, no change in status, stable for surgery.  I have reviewed the patient's chart and labs.  Questions were answered to the patient's satisfaction.    Cath Lab Visit (complete for each Cath Lab visit)  Clinical Evaluation Leading to the Procedure:   ACS: No.  Non-ACS:    Anginal Classification: CCS III  Anti-ischemic medical therapy: Maximal Therapy (2 or more classes of medications)  Non-Invasive Test Results: No non-invasive testing performed  Prior CABG: No previous CABG       Theron Arista [facility] 04/18/2023 12:06 PM

## 2023-04-19 NOTE — Anesthesia Postprocedure Evaluation (Signed)
Anesthesia Post Note  Patient: Mattson Southern.  Procedure(s) Performed: TRANSESOPHAGEAL ECHOCARDIOGRAM     Patient location during evaluation: PACU Anesthesia Type: MAC Level of consciousness: awake and alert Pain management: pain level controlled Vital Signs Assessment: post-procedure vital signs reviewed and stable Respiratory status: spontaneous breathing, nonlabored ventilation, respiratory function stable and patient connected to nasal cannula oxygen Cardiovascular status: stable and blood pressure returned to baseline Postop Assessment: no apparent nausea or vomiting Anesthetic complications: no  No notable events documented.  Last Vitals:  Vitals:   04/18/23 1500 04/18/23 1600  BP: 100/66 105/66  Pulse: 98 88  Resp:    Temp:    SpO2: 97% 100%    Last Pain:  Vitals:   04/18/23 1319  TempSrc:   PainSc: 0-No pain                 Andera Cranmer

## 2023-04-21 ENCOUNTER — Ambulatory Visit: Payer: PPO

## 2023-04-21 ENCOUNTER — Encounter (HOSPITAL_COMMUNITY): Payer: Self-pay | Admitting: Internal Medicine

## 2023-04-28 ENCOUNTER — Telehealth: Payer: Self-pay | Admitting: Cardiology

## 2023-04-28 ENCOUNTER — Ambulatory Visit: Payer: PPO

## 2023-04-28 NOTE — Telephone Encounter (Signed)
 Due to inclement weather and office closure the patient was contacted for rescheduling post cath. He continues to have SOB and also reports new dizziness with sudden position changes. Noted to have soft BP on last OV therefore encouraged to keep a BP and HR log for the next week and he was rescheduled to see myself 05/06/23 at 11:10AM. Plan to review log and make medication adjustments. TEE and cath results reviewed with no acute changes/findings. Instructed to reduce diltiazem  back to 120mg  (from 240mg ) until he is seen. ED precautions reviewed.   Kate Minus NP-C Structural Heart Team

## 2023-04-29 ENCOUNTER — Other Ambulatory Visit: Payer: Self-pay | Admitting: Cardiology

## 2023-04-29 DIAGNOSIS — R0602 Shortness of breath: Secondary | ICD-10-CM

## 2023-04-29 DIAGNOSIS — R0981 Nasal congestion: Secondary | ICD-10-CM | POA: Diagnosis not present

## 2023-05-02 DIAGNOSIS — R0982 Postnasal drip: Secondary | ICD-10-CM | POA: Diagnosis not present

## 2023-05-02 DIAGNOSIS — B9689 Other specified bacterial agents as the cause of diseases classified elsewhere: Secondary | ICD-10-CM | POA: Diagnosis not present

## 2023-05-02 DIAGNOSIS — J019 Acute sinusitis, unspecified: Secondary | ICD-10-CM | POA: Diagnosis not present

## 2023-05-06 ENCOUNTER — Ambulatory Visit: Payer: PPO | Attending: Cardiology | Admitting: Cardiology

## 2023-05-06 VITALS — BP 102/72 | HR 104 | Ht 67.0 in | Wt 216.6 lb

## 2023-05-06 DIAGNOSIS — L820 Inflamed seborrheic keratosis: Secondary | ICD-10-CM | POA: Diagnosis not present

## 2023-05-06 DIAGNOSIS — L570 Actinic keratosis: Secondary | ICD-10-CM | POA: Diagnosis not present

## 2023-05-06 DIAGNOSIS — R0602 Shortness of breath: Secondary | ICD-10-CM

## 2023-05-06 DIAGNOSIS — L578 Other skin changes due to chronic exposure to nonionizing radiation: Secondary | ICD-10-CM | POA: Diagnosis not present

## 2023-05-06 DIAGNOSIS — Z95818 Presence of other cardiac implants and grafts: Secondary | ICD-10-CM

## 2023-05-06 DIAGNOSIS — I4821 Permanent atrial fibrillation: Secondary | ICD-10-CM | POA: Diagnosis not present

## 2023-05-06 DIAGNOSIS — Z95820 Peripheral vascular angioplasty status with implants and grafts: Secondary | ICD-10-CM

## 2023-05-06 DIAGNOSIS — I25119 Atherosclerotic heart disease of native coronary artery with unspecified angina pectoris: Secondary | ICD-10-CM | POA: Diagnosis not present

## 2023-05-06 DIAGNOSIS — R42 Dizziness and giddiness: Secondary | ICD-10-CM | POA: Diagnosis not present

## 2023-05-06 DIAGNOSIS — L821 Other seborrheic keratosis: Secondary | ICD-10-CM | POA: Diagnosis not present

## 2023-05-06 MED ORDER — AMIODARONE HCL 200 MG PO TABS: ORAL_TABLET | ORAL | 3 refills | Status: DC

## 2023-05-06 NOTE — Patient Instructions (Signed)
 Medication Instructions:  Stop Diltiazem   Start Amiodarone  200 mg twice a day for 2 weeks, then decrease to 200 mg daily   *If you need a refill on your cardiac medications before your next appointment, please call your pharmacy*   Lab Work: None ordered   If you have labs (blood work) drawn today and your tests are completely normal, you will receive your results only by: MyChart Message (if you have MyChart) OR A paper copy in the mail If you have any lab test that is abnormal or we need to change your treatment, we will call you to review the results.   Testing/Procedures: None ordered    Follow-Up: Follow up as scheduled   Other Instructions

## 2023-05-06 NOTE — Progress Notes (Signed)
 [facility]                                     Cardiology Office Note:    Date:  05/06/2023   ID:  Andrew Hardy., DOB 03-Jun-1940, MRN [id number]  PCP:  Montey Lot, PA-C  [facility] Cardiologist:  Redell Leiter, MD  [facility] Electrophysiologist:  Will Gladis Norton, MD   Referring MD: Montey Lot, PA-C   Chief Complaint  Patient presents with   Dizziness   History of Present Illness:    Andrew Kimball. is a 83 y.o. male with a hx of obesity, HTN, HLD, CAD s/p PCI to LCx (07/2021), CKD stage IIIb, RBBB, HFpEF, GI bleeding and permanent atrial fibrillation s/p Watchman who is here for follow up of dizziness and SOB.    At the most recent appointment with Dr. Leiter, the patient was thought to be extremely high risk for bleeding given a prior history of GI bleed and now the use of antiplatelets for CAD in addition to anticoagulation. He was was referred to Dr. Cindie for consideration of LAAO with Watchman. Given abnormal renal function pre Watchman CT was deferred. He is now s/p successful LAAO with Watchman 24mm FLX Pro device on 02/20/2023. He has since stopped his Xarelto  and was continued on Plavix . Post implant TEE with stable device function with no leak or thrombus. In follow up, he reported anginal symptoms and was therefore set up for LHC. This showed stable CAD with patent stents.   Today he is here with his wife and reports ongoing exertional SOB and dizziness with change of position. 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 We discussed referral to pulmonary given no cardiac etiology identified. His baseline BP is rather soft at 102/72 today. His HR always runs on the higher side >100bpm making it difficult to treat with his orthostatic symptoms. Otherwise he denies chest pain, palpitations, LE edema, orthopnea, PND, or syncope. Denies bleeding in stool or urine.    Past Medical History:   Diagnosis Date   Anxiety    Arthritis    Atypical atrial flutter (HCC) 12/14/2014   Bradycardia, sinus 10/15/2014   Burning sensation of feet 01/17/2016   CAD, multiple vessel 10/18/2014   Overview:  DES to [facility] for unstable angina 10/17/14 Diagnostic Summary Severe stenosis of mid Cx likely cause of recent accelerating angina Diffuse severe stenosis of small caliber LAD unchanged from 2012 Normal LV function Paroxysmal SVT noted during procedure 04/23/16:  PCI / Resolute Drug Eluting Stent of the proximal Left Anterior Descending Coronary Artery. Successful PTCA of the ostial 1st Diagonal Coronary Artery   Chronic left-sided low back pain with sciatica 02/21/2016   Coronary artery disease 2003   s/p MI and numerous stents   Coronary artery disease involving native coronary artery of native heart with angina pectoris (HCC) 10/18/2014   Overview:  DES to [facility] for unstable angina 10/17/14 Diagnostic Summary Severe stenosis of mid Cx likely cause of recent accelerating angina Diffuse severe stenosis of small caliber LAD unchanged from 2012 Normal LV function Paroxysmal SVT noted during procedure 04/23/16:  PCI / Resolute Drug Eluting Stent of the proximal Left Anterior Descending Coronary Artery. 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   Degenerative spondylolisthesis 05/02/2017   Depression  Dyspnea    WITH EXERTION   Enlarged prostate without lower urinary tract symptoms (luts) 2012   frequent urination and nocturia; sees dr terra in [place]   Essential hypertension 10/15/2014   Gastroesophageal reflux disease without esophagitis 04/21/2016   GERD (gastroesophageal reflux disease)    Hyperlipidemia    Hypertension    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   IHD (ischemic heart disease) 10/15/2014   Lower limb pain, inferior, right 12/17/2017   Lumbar radiculopathy 02/21/2016   Myocardial infarction [facility])  2003   NSTEMI (non-ST elevated myocardial infarction) (HCC) 04/24/2016   Overview:  LHC 04/24/15: Severe stenosis of the LAD & Diagonal bifurcation  LV ejection fraction is 55-60 %  Interventional Summary Successful Angiosculpt PCI / Resolute Drug Eluting Stent of the proximal LAD Successful PTCA of the ostial 1st Diagonal Coronary Artery   Paroxysmal SVT (supraventricular tachycardia) (HCC) 11/16/2014   Precordial pain 10/15/2014   Presence of Watchman left atrial appendage closure device 02/20/2023   24mm Watchman FLX Pro with Dr. 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    Past Surgical History:  Procedure Laterality Date   BIOPSY  04/24/2020   Procedure: BIOPSY;  Surgeon: Albertus Gordy HERO, MD;  Location: [facility] ENDOSCOPY;  Service: Gastroenterology;;   CARDIAC CATHETERIZATION  10-2010   ptca/stent in [place]   CARDIAC CATHETERIZATION  04/23/2016   COLONOSCOPY WITH PROPOFOL  N/A 04/24/2020   Procedure: COLONOSCOPY WITH PROPOFOL ;  Surgeon: Albertus Gordy HERO, MD;  Location: [facility] ENDOSCOPY;  Service: Gastroenterology;  Laterality: N/A;   CORONARY ANGIOPLASTY     CORONARY STENT INTERVENTION N/A 08/10/2021   Procedure: CORONARY STENT INTERVENTION;  Surgeon: Anner Alm ORN, MD;  Location: [facility] INVASIVE CV LAB;  Service: Cardiovascular;  Laterality: N/A;   ESOPHAGOGASTRODUODENOSCOPY (EGD) WITH PROPOFOL  N/A 04/24/2020   Procedure: ESOPHAGOGASTRODUODENOSCOPY (EGD) WITH PROPOFOL ;  Surgeon: Albertus Gordy HERO, MD;  Location: [facility] ENDOSCOPY;  Service: Gastroenterology;  Laterality: N/A;   EYE SURGERY  05-1010   bil  cataract ,iol   FRACTURE SURGERY     HEMOSTASIS CONTROL  04/24/2020   Procedure: HEMOSTASIS CONTROL;  Surgeon: Albertus Gordy HERO, MD;  Location: [facility] ENDOSCOPY;  Service: Gastroenterology;;   I & D EXTREMITY Right 02/23/2015   Procedure: RIGHT RING FINGER REVISION AMPUTATION;  Surgeon: Franky Curia, MD;  Location: [facility] OR;  Service: Orthopedics;  Laterality: Right;   KNEE ARTHROSCOPY W/ MENISCAL REPAIR  2000   left knee   LEFT ATRIAL APPENDAGE OCCLUSION N/A 02/20/2023   Procedure: LEFT ATRIAL APPENDAGE OCCLUSION;  Surgeon: Cindie Ole DASEN, MD;  Location: [facility] INVASIVE CV LAB;  Service: Cardiovascular;  Laterality: N/A;   LEFT HEART CATH AND CORONARY ANGIOGRAPHY N/A 08/10/2021   Procedure: LEFT HEART CATH AND CORONARY ANGIOGRAPHY;  Surgeon: Anner Alm ORN, MD;  Location: [facility] INVASIVE CV LAB;  Service: Cardiovascular;  Laterality: N/A;   LEFT HEART CATH AND CORONARY ANGIOGRAPHY N/A 04/18/2023   Procedure: LEFT HEART CATH AND CORONARY ANGIOGRAPHY;  Surgeon: Jordan, Peter M, MD;  Location: [facility] INVASIVE CV LAB;  Service: Cardiovascular;  Laterality: N/A;   LUMBAR EPIDURAL INJECTION  2011   low back pain; DDD   POLYPECTOMY  04/24/2020   Procedure: POLYPECTOMY;  Surgeon: Albertus Gordy HERO, MD;  Location: [facility] ENDOSCOPY;  Service: Gastroenterology;;   REVERSE SHOULDER ARTHROPLASTY Left 11/23/2021   Procedure: REVERSE SHOULDER ARTHROPLASTY;  Surgeon: Kay Kemps, MD;  Location: [facility] ORS;  Service: Orthopedics;  Laterality: Left;   ring finger tramatic qamputation Right    SHOULDER ARTHROSCOPY  03/29/2011   Procedure: ARTHROSCOPY SHOULDER;  Surgeon: Elspeth JONELLE Kay;  Location: [facility] OR;  Service: Orthopedics;  Laterality: Right;  RIGHT SHOULDER ARTHROSCOPY WITH OPEN SUBSCAPULAR REPAIR   SHOULDER OPEN ROTATOR CUFF REPAIR     3 on rt shoulder; 2 on left   TRANSESOPHAGEAL ECHOCARDIOGRAM (CATH LAB) N/A 02/20/2023   Procedure: TRANSESOPHAGEAL ECHOCARDIOGRAM;  Surgeon: Cindie Ole DASEN, MD;  Location: [facility] INVASIVE CV LAB;   Service: Cardiovascular;  Laterality: N/A;   TRANSESOPHAGEAL ECHOCARDIOGRAM (CATH LAB) N/A 04/18/2023   Procedure: TRANSESOPHAGEAL ECHOCARDIOGRAM;  Surgeon: Santo Stanly LABOR, MD;  Location: [facility] INVASIVE CV LAB;  Service: Cardiovascular;  Laterality: N/A;   TRANSFORAMINAL LUMBAR INTERBODY FUSION (TLIF) WITH PEDICLE SCREW FIXATION 1 LEVEL N/A 04/24/2017   Procedure: TRANSFORAMINAL LUMBAR INTERBODY FUSION (TLIF) L4-5;  Surgeon: Burnetta Aures, MD;  Location: [facility] OR;  Service: Orthopedics;  Laterality: N/A;  4 hrs    Current Medications: Current Meds  Medication Sig   amiodarone  (PACERONE ) 200 MG tablet Take 200 mg by mouth twice a day for 2 weeks, then decrease to 200 mg daily   amoxicillin  (AMOXIL ) 500 MG tablet TAKE 4 TABS PRIOR TO DENTAL PROCEDURES ONE HOUR PRIOR TO DENTAL PROCEDURES   Ascorbic Acid  (VITAMIN C) 1000 MG tablet Take 1,000 mg by mouth daily.   clopidogrel  (PLAVIX ) 75 MG tablet Take 1 tablet (75 mg total) by mouth daily.   Coenzyme Q10 (COQ10) 50 MG CAPS Take 50 mg by mouth daily.   cyanocobalamin  (VITAMIN B12) 1000 MCG tablet Take 1,000 mcg by mouth daily.   DULoxetine  (CYMBALTA ) 30 MG capsule Take 30 mg by mouth 2 (two) times daily.   ezetimibe  (ZETIA ) 10 MG tablet Take 1 tablet (10 mg total) by mouth daily.   finasteride  (PROSCAR ) 5 MG tablet Take 5 mg by mouth daily.   Flaxseed, Linseed, (FLAXSEED OIL MAX STR) 1300 MG CAPS Take 1,300 mg by mouth daily.   furosemide  (LASIX ) 80 MG tablet Take 1 tablet (80 mg total) by mouth in the morning. (Patient taking differently: Take 40-80 mg by mouth See admin instructions. 80 mg in the morning, 40 mg in the evening)   gemfibrozil  (LOPID ) 600 MG tablet TAKE 1/2 TABLET(300 MG) BY MOUTH TWICE DAILY BEFORE A MEAL   Glucosamine HCl (GLUCOSAMINE PO) Take 1 tablet by mouth 2 (two) times daily.   isosorbide  mononitrate (IMDUR ) 60 MG 24 hr tablet Take 1 tablet (60 mg total) by mouth daily.   metoprolol  succinate (TOPROL -XL) 25 MG 24 hr tablet  Take 1 tablet (25 mg total) by mouth 3 (three) times daily. (Patient taking differently: Take 25-50 mg by mouth See admin instructions. Take 50 mg in the morning and 25 mg in the evening)   Multiple Vitamins-Minerals (MULTIVITAMINS THER. W/MINERALS) TABS Take 1 tablet by mouth daily.   nitroGLYCERIN  (NITROSTAT ) 0.4 MG SL tablet PLACE 1 TABLET UNDER THE TONGUE EVERY 5 MINUTES AS NEEDED FOR CHEST PAIN   Omega-3 Fatty Acids (FISH OIL) 1000 MG CAPS Take 1,000 mg by mouth daily.   pantoprazole  (PROTONIX ) 40 MG tablet Take 1 tablet (40 mg total) by  mouth daily.   potassium chloride  SA (KLOR-CON  M) 20 MEQ tablet TAKE 2 TABLETS BY MOUTH DAILY THEN TAKE 1 TABLET BY MOUTH AT NIGHT   rosuvastatin  (CRESTOR ) 10 MG tablet Take 10 mg by mouth daily.   tamsulosin  (FLOMAX ) 0.4 MG CAPS capsule Take 0.4 mg by mouth 2 (two) times daily.   traZODone  (DESYREL ) 100 MG tablet Take 100 mg by mouth at bedtime.   TURMERIC CURCUMIN PO Take 1 capsule by mouth daily.   [DISCONTINUED] diltiazem  (CARDIZEM  CD) 120 MG 24 hr capsule Take 120 mg by mouth daily.     Allergies:   Amiodarone , Buprenorphine hcl, Codeine, Hydrocodone , Percocet [oxycodone -acetaminophen ], and Sotalol  hcl   Social History   Socioeconomic History   Marital status: Married    Spouse name: Not on file   Number of children: Not on file   Years of education: Not on file   Highest education level: Not on file  Occupational History   Not on file  Tobacco Use   Smoking status: Former    Current packs/day: 0.00    Average packs/day: 1 pack/day for 20.0 years (20.0 ttl pk-yrs)    Types: Cigarettes    Start date: 03/20/1974    Quit date: 03/20/1994    Years since quitting: 29.1   Smokeless tobacco: Former    Types: Chew    Quit date: 03/20/1996  Vaping Use   Vaping status: Never Used  Substance and Sexual Activity   Alcohol  use: Yes    Alcohol /week: 6.0 standard drinks of alcohol     Types: 6 Cans of beer per week    Comment: BEER- DAILY   Drug  use: No   Sexual activity: Not on file  Other Topics Concern   Not on file  Social History Narrative   Not on file   Social Drivers of Health   Financial Resource Strain: Not on file  Food Insecurity: Not on file  Transportation Needs: Not on file  Physical Activity: Not on file  Stress: Not on file  Social Connections: Not on file    Family History: The patient's family history includes Cancer in his father and sister; Heart disease in his father and mother.  ROS:   Please see the history of present illness.    All other systems reviewed and are negative.  EKGs/Labs/Other Studies Reviewed:    The following studies were reviewed today:   Cardiac Studies & Procedures   CARDIAC CATHETERIZATION  CARDIAC CATHETERIZATION 04/18/2023  Narrative Nonobstructive CAD. Continued patency of multiple stents Mildly elevated LVEDP 21 mm Hg  Plan; continue medical therapy  Findings Coronary Findings Diagnostic  Dominance: Right  Left Anterior Descending Vessel is moderate in size. Non-stenotic Prox LAD to Mid LAD lesion was previously treated. Mid LAD lesion is 55% stenosed. The lesion is located at the bend. Dist LAD lesion is 50% stenosed. The lesion is located at the bend.  First Diagonal Branch Vessel is small in size. 1st Diag lesion is 60% stenosed. The lesion was previously treated using angioplasty . Rescue PTCA after stenting across  Second Diagonal Branch Vessel is small in size.  Third Diagonal Branch Vessel is small in size.  Left Circumflex Vessel is small. Non-stenotic Prox Cx to Dist Cx lesion was previously treated. Non-stenotic Dist Cx lesion was previously treated. The lesion is distal to major branch, focal, concentric and irregular. Distal to the previous stent  First Obtuse Marginal Branch Vessel is small in size. Non-stenotic 1st Mrg lesion with 100% stenosed side  branch in Lat 1st Mrg was previously treated. The lesion is eccentric and  ulcerative. At very small sidebranch  Left Posterior Atrioventricular Artery Vessel is small in size.  Right Coronary Artery Prox RCA to Mid RCA lesion is 30% stenosed. The lesion was previously treated .  Intervention  No interventions have been documented.   CARDIAC CATHETERIZATION  CARDIAC CATHETERIZATION 08/10/2021  Narrative   Prox RCA to Mid RCA lesion is 30% stenosed.   Previously placed Prox LAD to Mid LAD stent (unknown type) is  widely patent.  Jailed 1st Diag lesion is 60% stenosed.   Previously placed Prox Cx to Dist Cx stent (unknown type) is  widely patent.   Lesion #1: Dist Cx lesion is 90% stenosed.   A drug-eluting stent was successfully placed using a STENT ONYX FRONTIER 2.25X12.  Deployed at 2.35 mm and postdilated to 2.45 mm in the overlap segment.   Post intervention, there is a 0% residual stenosis.   Lesion #2: 1st Mrg lesion is 80% stenosed with 95% stenosed side branch in Lat 1st Mrg.   Mid LAD lesion is 55% stenosed.   Dist LAD lesion is 50% stenosed.   A drug-eluting stent was successfully placed using a STENT ONYX FRONTIER 2.25X1 -> deployed to 2.45 mm.   Post intervention, there is a 0% residual stenosis.  Post intervention, the side branch was reduced to 100% residual stenosis.  (Patient did have chest pain with stent placement, resolved with sublingual nitroglycerin  and fentanyl /Versed .)  Multivessel CAD with 2 culprit lesions: Mid OM1 (85%) at small branch and distal LCx-OM2 (90%) just distal to prior stent.  Mild ISR in RCA stent and ostial diagonal (jailed) 60%.  Tandem 50 to 60% lesions in the mid LAD that are in hinge points.  Not likely flow-limiting. Successful two-vessel PCI of 2 culprit lesions reducing to 0% stenosis each treated with Onyx frontier DES 2.25 mm 12 mm stents dilated to 2.45 mm. Preserved LVEF with low normal roughly 50 to 55%.  Inadequate filling-therefore unable to truly estimate EF and wall motion.  Findings Coronary  Findings Diagnostic  Dominance: Right  Left Anterior Descending Vessel is moderate in size. Previously placed Prox LAD to Mid LAD stent (unknown type) is  widely patent. Mid LAD lesion is 55% stenosed. The lesion is located at the bend. Dist LAD lesion is 50% stenosed. The lesion is located at the bend.  First Diagonal Branch Vessel is small in size. 1st Diag lesion is 60% stenosed. The lesion was previously treated using angioplasty . Rescue PTCA after stenting across  Second Diagonal Branch Vessel is small in size.  Third Diagonal Branch Vessel is small in size.  Left Circumflex Vessel is small. Previously placed Prox Cx to Dist Cx stent (unknown type) is  widely patent. Dist Cx lesion is 90% stenosed. The lesion is distal to major branch, focal, concentric and irregular. Distal to the previous stent  First Obtuse Marginal Branch Vessel is small in size. 1st Mrg lesion is 80% stenosed with 95% stenosed side branch in Lat 1st Mrg. The lesion is eccentric and ulcerative. At very small sidebranch The lesion was not previously treated .  Left Posterior Atrioventricular Artery Vessel is small in size.  Right Coronary Artery Prox RCA to Mid RCA lesion is 30% stenosed. The lesion was previously treated .  Intervention  Prox Cx to Dist Cx lesion Stent (Also treats lesions: Dist Cx) Lesion length:  8 mm. CATH VISTA GUIDE 6FR XB3.5 guide catheter was inserted.  Lesion crossed with guidewire using a WIRE ASAHI PROWATER 180CM. Pre-stent angioplasty was performed using a BALLN SAPPHIRE 2.25X10. Maximum pressure:  10 atm. Inflation time: 20 sec. A drug-eluting stent was successfully placed using a STENT ONYX FRONTIER 2.25X12. Maximum pressure: 14 atm. Inflation time: 30 sec. Stent strut is well apposed. Stent balloon pulled back to the overlapping segment and inflated to high ATM Deployed to 2.35 mm, but that the overlap site dilated to 2.45 mm. Post-stent angioplasty was not performed.  Stent balloon at high ATM for overlap-18 ATM Downstream lesion covered with a overlapping stent, overlapping segment postdilated with stent balloon to high ATM. Post-Intervention Lesion Assessment The intervention was successful. Pre-interventional TIMI flow is 3. Post-intervention TIMI flow is 3. There is a 0% residual stenosis post intervention.  Dist Cx lesion Stent (Also treats lesions: Prox Cx to Dist Cx) See details in Prox Cx to Dist Cx lesion. Post-Intervention Lesion Assessment The intervention was successful. Pre-interventional TIMI flow is 3. Post-intervention TIMI flow is 3. Treated lesion length:  10 mm. No complications occurred at this lesion. There is a 0% residual stenosis post intervention.  1st Mrg lesion with side branch in Lat 1st Mrg Stent - Main Branch Lesion length:  10 mm. CATH VISTA GUIDE 6FR XB3.5 guide catheter was inserted. Lesion crossed with guidewire using a WIRE ASAHI PROWATER 180CM. Pre-stent angioplasty was performed using a BALLN SAPPHIRE 2.25X10. Maximum pressure:  10 atm. Inflation time:  20 sec. A drug-eluting stent was successfully placed using a STENT ONYX FRONTIER 2.25X12. Maximum pressure: 16 atm. Inflation time: 30 sec. Stent strut is well apposed. Deployed to 2.45 mm Post-stent angioplasty was not performed. Same Prowater wire was read directed after PCI of the distal LCx into the OM1 Post-Intervention Lesion Assessment The intervention was successful. Pre-interventional TIMI flow is 3. Post-intervention TIMI flow is 3. Treated lesion length:  12 mm. Very small sidebranch occluded =&gt; 4 out of 10 chest pain, resolved with sublingual nitroglycerin  and second round of fentanyl  and Versed . There is a 0% residual stenosis in the main branch post intervention. There is a 100% residual stenosis in the side branch post intervention.   STRESS TESTS  MYOCARDIAL PERFUSION IMAGING 03/25/2019  Narrative  There was no ST segment deviation noted during  stress.  No T wave inversion was noted during stress.  Nongated study due to atrial fibrillation.  The study is normal.  This is a low risk study.  ECHOCARDIOGRAM  ECHOCARDIOGRAM COMPLETE 01/29/2022  Narrative ECHOCARDIOGRAM REPORT    Patient Name:   Andrew Gappa. Date of Exam: 01/29/2022 Medical Rec #:  [id number]       Height:       66.0 in Accession #:    [id number]      Weight:       204.8 lb Date of Birth:  1941/02/14       BSA:          2.020 m Patient Age:    29 years        BP:           94/60 mmHg Patient Gender: M               HR:           92 bpm. Exam Location:  [place]  Procedure: 2D Echo, 3D Echo, Cardiac Doppler, Color Doppler and Strain Analysis  Indications:    Coronary artery disease involving native coronary artery of native heart with angina pectoris (HCC) [  I25.119 (ICD-10-CM)]; Persistent atrial fibrillation (HCC) [I48.19 (ICD-10-CM)]; Chronic anticoagulation [Z79.01 (ICD-10-CM)]; High risk medication use [Z79.899 (ICD-10-CM)]; Hypertensive heart disease with heart failure (HCC) [I11.0 (ICD-10-CM)]; Mixed hyperlipidemia [E78.2 (ICD-10-CM)]; SOB (shortness of breath) on exertion [R06.02 (ICD-10-CM)]  History:        Patient has prior history of Echocardiogram examinations, most recent 05/04/2019. CAD and Previous Myocardial Infarction, Arrythmias:Atrial Fibrillation, SVT and PVC; Risk Factors:Dyslipidemia and Hypertension.  Sonographer:    Charlie Jointer RDCS Referring Phys: [id number] BRIAN J MUNLEY  IMPRESSIONS   1. Left ventricular ejection fraction, by estimation, is 50 to 55%. Left ventricular ejection fraction by 3D volume is 53 %. The left ventricle has low normal function. The left ventricle has no regional wall motion abnormalities. Left ventricular diastolic parameters are indeterminate. The average left ventricular global longitudinal strain is -12.8 %. The global longitudinal strain is abnormal. 2. Right ventricular systolic function  is normal. The right ventricular size is normal. There is normal pulmonary artery systolic pressure. 3. Left atrial size was mildly dilated. 4. The mitral valve is degenerative. Mild mitral valve regurgitation. No evidence of mitral stenosis. 5. The aortic valve is tricuspid. Aortic valve regurgitation is mild. Aortic valve sclerosis is present, with no evidence of aortic valve stenosis. 6. Aortic normal DTA. 7. The inferior vena cava is dilated in size with >50% respiratory variability, suggesting right atrial pressure of 8 mmHg.  FINDINGS Left Ventricle: Left ventricular ejection fraction, by estimation, is 50 to 55%. Left ventricular ejection fraction by 3D volume is 53 %. The left ventricle has low normal function. The left ventricle has no regional wall motion abnormalities. The average left ventricular global longitudinal strain is -12.8 %. The global longitudinal strain is abnormal. The left ventricular internal cavity size was normal in size. There is no left ventricular hypertrophy. Left ventricular diastolic parameters are indeterminate.  Right Ventricle: The right ventricular size is normal. No increase in right ventricular wall thickness. Right ventricular systolic function is normal. There is normal pulmonary artery systolic pressure. The tricuspid regurgitant velocity is 2.47 m/s, and with an assumed right atrial pressure of 8 mmHg, the estimated right ventricular systolic pressure is 32.4 mmHg.  Left Atrium: Left atrial size was mildly dilated.  Right Atrium: Right atrial size was normal in size.  Pericardium: There is no evidence of pericardial effusion.  Mitral Valve: The mitral valve is degenerative in appearance. Mild mitral annular calcification. Mild mitral valve regurgitation. No evidence of mitral valve stenosis.  Tricuspid Valve: The tricuspid valve is normal in structure. Tricuspid valve regurgitation is mild . No evidence of tricuspid stenosis.  Aortic Valve: The  aortic valve is tricuspid. Aortic valve regurgitation is mild. Aortic regurgitation PHT measures 724 msec. Aortic valve sclerosis is present, with no evidence of aortic valve stenosis.  Pulmonic Valve: The pulmonic valve was normal in structure. Pulmonic valve regurgitation is not visualized. No evidence of pulmonic stenosis.  Aorta: The aortic root and ascending aorta are structurally normal, with no evidence of dilitation, normal DTA and the aortic arch was not well visualized.  Venous: A systolic blunting flow pattern is recorded from the right upper pulmonary vein. The inferior vena cava is dilated in size with greater than 50% respiratory variability, suggesting right atrial pressure of 8 mmHg.  IAS/Shunts: No atrial level shunt detected by color flow Doppler.   LEFT VENTRICLE PLAX 2D LVIDd:         4.20 cm         Diastology LVIDs:  3.00 cm         LV e' medial:    8.38 cm/s LV PW:         1.10 cm         LV E/e' medial:  11.9 LV IVS:        1.20 cm         LV e' lateral:   12.60 cm/s LVOT diam:     2.00 cm         LV E/e' lateral: 7.9 LV SV:         48 LV SV Index:   24              2D LVOT Area:     3.14 cm        Longitudinal Strain 2D Strain GLS  -12.8 % LV Volumes (MOD)               Avg: LV vol d, MOD    74.4 ml A2C:                           3D Volume EF LV vol d, MOD    109.0 ml      LV 3D EF:    Left A4C:                                        ventricul LV vol s, MOD    33.3 ml                    ar A2C:                                        ejection LV vol s, MOD    50.0 ml                    fraction A4C:                                        by 3D LV SV MOD A2C:   41.1 ml                    volume is LV SV MOD A4C:   109.0 ml                   53 %. LV SV MOD BP:    52.7 ml  3D Volume EF: 3D EF:        53 % LV EDV:       118 ml LV ESV:       56 ml LV SV:        62 ml  RIGHT VENTRICLE             IVC RV Basal diam:  3.00 cm     IVC diam: 2.50  cm RV S prime:     13.80 cm/s TAPSE (M-mode): 1.6 cm  LEFT ATRIUM             Index        RIGHT ATRIUM           Index LA diam:  4.10 cm 2.03 cm/m   RA Area:     17.30 cm LA Vol (A2C):   75.8 ml 37.52 ml/m  RA Volume:   37.00 ml  18.32 ml/m LA Vol (A4C):   63.7 ml 31.53 ml/m LA Biplane Vol: 70.6 ml 34.95 ml/m AORTIC VALVE LVOT Vmax:   87.70 cm/s LVOT Vmean:  62.500 cm/s LVOT VTI:    0.152 m AI PHT:      724 msec  AORTA Ao Root diam: 3.10 cm Ao Asc diam:  3.40 cm Ao Desc diam: 2.30 cm  MITRAL VALVE               TRICUSPID VALVE MV Area (PHT): 3.53 cm    TR Peak grad:   24.4 mmHg MV Decel Time: 215 msec    TR Vmax:        247.00 cm/s MR Peak grad: 50.7 mmHg MR Vmax:      356.00 cm/s  SHUNTS MV E velocity: 99.80 cm/s  Systemic VTI:  0.15 m Systemic Diam: 2.00 cm  Redell Leiter MD Electronically signed by Redell Leiter MD Signature Date/Time: 01/29/2022/12:40:53 PM    Final  TEE  ECHO TEE 04/18/2023  Narrative TRANSESOPHOGEAL ECHO REPORT    Patient Name:   Andrew Arakawa. Date of Exam: 04/18/2023 Medical Rec #:  [id number]       Height:       67.0 in Accession #:    [id number]      Weight:       214.0 lb Date of Birth:  Jul 06, 1940       BSA:          2.081 m Patient Age:    82 years        BP:           113/73 mmHg Patient Gender: M               HR:           91 bpm. Exam Location:  Outpatient  Procedure: 3D Echo, Transesophageal Echo, Cardiac Doppler and Color Doppler  Indications:     Watchman evaluation  History:         Patient has prior history of Echocardiogram examinations, most recent 02/20/2023.  Sonographer:     Tinnie Rgers RDCS Referring Phys:  [id number] OLE ONEIDA HOLTS Diagnosing Phys: Stanly Leavens MD  PROCEDURE: After discussion of the risks and benefits of a TEE, an informed consent was obtained from the patient. The transesophogeal probe was passed without difficulty through the esophogus of the patient. Sedation performed  by different physician. The patient developed no complications during the procedure.  IMPRESSIONS   1. Left ventricular ejection fraction, by estimation, is 55 to 60%. The left ventricle has normal function. 2. Right ventricular systolic function is moderately reduced. The right ventricular size is normal. 3. There isa 24 mm Watchman FLX present. No device leak. No DRT. Average compression 14%. Well seated device. Left atrial size was severely dilated. No left atrial/left atrial appendage thrombus was detected. 4. The mitral valve is normal in structure. Mild mitral valve regurgitation. No evidence of mitral stenosis. 5. The aortic valve is tricuspid. Aortic valve regurgitation is mild. Aortic valve sclerosis is present, with no evidence of aortic valve stenosis. 6. There is mild (Grade II) plaque involving the descending aorta. 7. Evidence of atrial level shunting detected by color flow Doppler. Minimal iatrogenic ASD is left.  FINDINGS Left Ventricle: Left ventricular ejection fraction, by estimation,  is 55 to 60%. The left ventricle has normal function. The left ventricular internal cavity size was normal in size.  Right Ventricle: The right ventricular size is normal. No increase in right ventricular wall thickness. Right ventricular systolic function is moderately reduced.  Left Atrium: There isa 24 mm Watchman FLX present. No device leak. No DRT. Average compression 14%. Well seated device. Left atrial size was severely dilated. No left atrial/left atrial appendage thrombus was detected.  Right Atrium: Right atrial size was normal in size.  Pericardium: There is no evidence of pericardial effusion.  Mitral Valve: The mitral valve is normal in structure. Mild mitral valve regurgitation. No evidence of mitral valve stenosis.  Tricuspid Valve: The tricuspid valve is normal in structure. Tricuspid valve regurgitation is mild . No evidence of tricuspid stenosis.  Aortic Valve: The  aortic valve is tricuspid. Aortic valve regurgitation is mild. Aortic valve sclerosis is present, with no evidence of aortic valve stenosis.  Pulmonic Valve: The pulmonic valve was normal in structure. Pulmonic valve regurgitation is trivial. No evidence of pulmonic stenosis.  Aorta: The aortic root and ascending aorta are structurally normal, with no evidence of dilitation. There is mild (Grade II) plaque involving the descending aorta.  IAS/Shunts: The interatrial septum appears to be lipomatous. Evidence of atrial level shunting detected by color flow Doppler.  Stanly Leavens MD Electronically signed by Stanly Leavens MD Signature Date/Time: 04/18/2023/4:24:46 PM    Final (Updated)  MONITORS  LONG TERM MONITOR (3-14 DAYS) 01/29/2022  Narrative Patch Wear Time:  7 days and 10 hours (2023-09-25T08:59:54-0400 to 2023-10-02T19:21:24-0400)  Atrial Fibrillation occurred continuously (100% burden), ranging from 57-177 bpm (avg of 96 bpm). Bundle Branch Block/IVCD was present.  There were no pauses of 3 seconds or greater  Isolated VEs were rare (<1.0%), VE Couplets were rare (<1.0%), and no VE Triplets were present. Ventricular Bigeminy was present.  Heart rate was greater than 110 bpm 26% of the time daytime 2% of the time nighttime.  Her 48 triggered events all associated with atrial fibrillation generally rapid rates greater than 110 bpm and 1 episode with ventricular bigeminy.           EKG:  EKG is ordered today.  The ekg ordered today demonstrates AF with HR 97bpm.   Recent Labs: 08/13/2022: ALT 10 03/31/2023: BUN 18; Creatinine, Ser 1.20; Hemoglobin 12.2; Platelets 272; Potassium 4.4; Sodium 141   Recent Lipid Panel    Component Value Date/Time   CHOL 136 08/13/2022 0900   TRIG 146 08/13/2022 0900   HDL 54 08/13/2022 0900   CHOLHDL 2.5 08/13/2022 0900   LDLCALC 57 08/13/2022 0900   Physical Exam:    VS:  BP 102/72   Pulse (!) 104   Ht 5' 7 (1.702 m)    Wt 216 lb 9.6 oz (98.2 kg)   SpO2 98%   BMI 33.92 kg/m     Wt Readings from Last 3 Encounters:  05/06/23 216 lb 9.6 oz (98.2 kg)  04/18/23 214 lb (97.1 kg)  03/31/23 215 lb (97.5 kg)    General: Well developed, well nourished, NAD Lungs:Clear to ausculation bilaterally. No wheezes, rales, or rhonchi. Breathing is unlabored. Cardiovascular: Irregularly irregular. No murmurs Extremities: No edema. No clubbing or cyanosis. DP/PT pulses 2+ bilaterally Neuro: Alert and oriented. No focal deficits. No facial asymmetry. MAE spontaneously. Psych: Responds to questions appropriately with normal affect.    ASSESSMENT/PLAN:     Permanent atrial fibrillation: s/p LAAO with Watchman 24mm FLX Pro device. HR's  remain elevated in the 100 range however difficult to treat with soft BPs and orthostasis. Plan to stop diltiazem  and start Amiodarone  200mg  BID x 1 week then reduce to 200mg  daily thereafter. He will see me back 06/02/23. Otherwise continue Plavix  through 6 months post implant. Dental SBE already discussed and sent to pharmacy.    DOE/SOB with significant hx of CAD: Recent LHC with no acute changes and patent stents. TEE with EF at 55-60% and no valvular disease. Given long hx of chemical exposure, I have placed a referral to pulmonary medicine.   Hypotension: Soft BPs at baseline. Stop diltiazem  to allow greater BP. Start Amiodarone  200mg  BID x 1 week then reduce to 200mg  daily. Plan follow up 2/10.    CKD stage IIIb: Last Cr appears to be at his baseline of 1.2.    HFpEF: Continue current Lasix  dose of 80mg  AM. Appears euvolemic today. No edema.   I spent 25 minutes caring for this patient today including face-to-face discussions, ordering and reviewing labs, reviewing records from [facility] and other outside facilities, documenting in the record, and arranging for follow up.    Medication Adjustments/Labs and Tests Ordered: Current medicines are reviewed at length with the patient today.   Concerns regarding medicines are outlined above.  Orders Placed This Encounter  Procedures   EKG 12-Lead   Meds ordered this encounter  Medications   amiodarone  (PACERONE ) 200 MG tablet    Sig: Take 200 mg by mouth twice a day for 2 weeks, then decrease to 200 mg daily    Dispense:  90 tablet    Refill:  3    Patient Instructions  Medication Instructions:  Stop Diltiazem   Start Amiodarone  200 mg twice a day for 2 weeks, then decrease to 200 mg daily   *If you need a refill on your cardiac medications before your next appointment, please call your pharmacy*   Lab Work: None ordered   If you have labs (blood work) drawn today and your tests are completely normal, you will receive your results only by: MyChart Message (if you have MyChart) OR A paper copy in the mail If you have any lab test that is abnormal or we need to change your treatment, we will call you to review the results.   Testing/Procedures: None ordered    Follow-Up: Follow up as scheduled   Other Instructions          Signed, Kate Minus, NP  05/06/2023 1:05 PM    [facility]

## 2023-05-19 DIAGNOSIS — K219 Gastro-esophageal reflux disease without esophagitis: Secondary | ICD-10-CM | POA: Diagnosis not present

## 2023-05-19 DIAGNOSIS — Z23 Encounter for immunization: Secondary | ICD-10-CM | POA: Diagnosis not present

## 2023-05-19 DIAGNOSIS — M549 Dorsalgia, unspecified: Secondary | ICD-10-CM | POA: Diagnosis not present

## 2023-05-19 DIAGNOSIS — E1169 Type 2 diabetes mellitus with other specified complication: Secondary | ICD-10-CM | POA: Diagnosis not present

## 2023-05-19 DIAGNOSIS — Z79899 Other long term (current) drug therapy: Secondary | ICD-10-CM | POA: Diagnosis not present

## 2023-05-19 DIAGNOSIS — G4700 Insomnia, unspecified: Secondary | ICD-10-CM | POA: Diagnosis not present

## 2023-05-19 DIAGNOSIS — I4891 Unspecified atrial fibrillation: Secondary | ICD-10-CM | POA: Diagnosis not present

## 2023-05-19 DIAGNOSIS — E7800 Pure hypercholesterolemia, unspecified: Secondary | ICD-10-CM | POA: Diagnosis not present

## 2023-05-19 DIAGNOSIS — I25118 Atherosclerotic heart disease of native coronary artery with other forms of angina pectoris: Secondary | ICD-10-CM | POA: Diagnosis not present

## 2023-05-19 DIAGNOSIS — N400 Enlarged prostate without lower urinary tract symptoms: Secondary | ICD-10-CM | POA: Diagnosis not present

## 2023-05-19 DIAGNOSIS — G8929 Other chronic pain: Secondary | ICD-10-CM | POA: Diagnosis not present

## 2023-05-19 LAB — LAB REPORT - SCANNED: EGFR: 55

## 2023-05-29 NOTE — Progress Notes (Signed)
 [facility]                                     Cardiology Office Note:    Date:  06/02/2023   ID:  Andrew Hardy., DOB June 22, 1940, MRN [id number]  PCP:  Aloha Arnold, PA-C  [facility] Cardiologist:  Zoe Hinds, MD  [facility] Electrophysiologist:  Will Cortland Ding, MD   Referring MD: Aloha Arnold, PA-C   Chief Complaint  Patient presents with   follow up mec change   History of Present Illness:    Andrew Hardy. is a 83 y.o. male with a hx of obesity, HTN, HLD, CAD s/p PCI to LCx (07/2021), CKD stage IIIb, RBBB, HFpEF, GI bleeding and permanent atrial fibrillation s/p Watchman who is here for follow up of dizziness and SOB.    At the most recent appointment with Dr. Sandee Crook, the patient was thought to be extremely high risk for bleeding given a prior history of GI bleed and now the use of antiplatelets for CAD in addition to anticoagulation. He was was referred to Dr. Marven Slimmer for consideration of LAAO with Watchman. Given abnormal renal function pre Watchman CT was deferred. He is now s/p successful LAAO with Watchman 24mm FLX Pro device on 02/20/2023. He has since stopped his Xarelto  and was continued on Plavix . Post implant TEE with stable device function with no leak or thrombus. In follow up, he reported anginal symptoms and was therefore set up for LHC. This showed stable CAD with patent stents.   In follow up, he reported ongoing exertional SOB and dizziness with change of position. 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 His diltiazem  was stopped and transitioned to Amiodarone .   Today he is here with his wife and reports the dizziness has somewhat improved but he continues to have stable DOE although sounds like this does not affect his daily life. He worked outside in the yard/garden from 10am-8pm last night with no issues. Pulmonary referral placed last OV. Looks like they should be calling him  soon. Otherwise he denies chest pain, palpitations, LE edema, orthopnea, PND, or syncope. Denies bleeding in stool or urine.    Past Medical History:  Diagnosis Date   Anxiety    Arthritis    Atypical atrial flutter (HCC) 12/14/2014   Bradycardia, sinus 10/15/2014   Burning sensation of feet 01/17/2016   CAD, multiple vessel 10/18/2014   Overview:  DES to [facility] for unstable angina 10/17/14 Diagnostic Summary Severe stenosis of mid Cx likely cause of recent accelerating angina Diffuse severe stenosis of small caliber LAD unchanged from 2012 Normal LV function Paroxysmal SVT noted during procedure 04/23/16:  PCI / Resolute Drug Eluting Stent of the proximal Left Anterior Descending Coronary Artery. Successful PTCA of the ostial 1st Diagonal Coronary Artery   Chronic left-sided low back pain with sciatica 02/21/2016   Coronary artery disease 2003   s/p MI and numerous stents   Coronary artery disease involving native coronary artery of native heart with angina pectoris (HCC) 10/18/2014   Overview:  DES to [facility] for unstable angina 10/17/14 Diagnostic Summary Severe stenosis of mid Cx likely cause of recent accelerating angina Diffuse severe stenosis of small caliber LAD unchanged from 2012 Normal LV function Paroxysmal SVT noted during procedure 04/23/16:  PCI / Resolute Drug Eluting Stent of the proximal Left Anterior Descending Coronary  Artery. 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   Degenerative spondylolisthesis 05/02/2017   Depression    Dyspnea    WITH EXERTION   Enlarged prostate without lower urinary tract symptoms (luts) 2012   frequent urination and nocturia; sees dr Nadia Aurora in [place]   Essential hypertension 10/15/2014   Gastroesophageal reflux disease without esophagitis 04/21/2016   GERD (gastroesophageal reflux disease)    Hyperlipidemia    Hypertension    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   IHD  (ischemic heart disease) 10/15/2014   Lower limb pain, inferior, right 12/17/2017   Lumbar radiculopathy 02/21/2016   Myocardial infarction [facility]) 2003   NSTEMI (non-ST elevated myocardial infarction) (HCC) 04/24/2016   Overview:  LHC 04/24/15: Severe stenosis of the LAD & Diagonal bifurcation  LV ejection fraction is 55-60 %  Interventional Summary Successful Angiosculpt PCI / Resolute Drug Eluting Stent of the proximal LAD Successful PTCA of the ostial 1st Diagonal Coronary Artery   Paroxysmal SVT (supraventricular tachycardia) (HCC) 11/16/2014   Precordial pain 10/15/2014   Presence of Watchman left atrial appendage closure device 02/20/2023   24mm Watchman FLX Pro with Dr. 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    Past Surgical History:  Procedure Laterality Date   BIOPSY  04/24/2020   Procedure: BIOPSY;  Surgeon: Nannette Babe, MD;  Location: [facility] ENDOSCOPY;  Service: Gastroenterology;;   CARDIAC CATHETERIZATION  10-2010   ptca/st[place] in [place]   CARDIAC CATHETERIZATION  04/23/2016   COLONOSCOPY WITH PROPOFOL  N/A 04/24/2020   Procedure: COLONOSCOPY WITH PROPOFOL ;  Surgeon: Nannette Babe, MD;  Location: [facility] ENDOSCOPY;  Service: Gastroenterology;  Laterality: N/A;   CORONARY ANGIOPLASTY     CORONARY STENT INTERVENTION N/A 08/10/2021   Procedure: CORONARY STENT INTERVENTION;  Surgeon: Arleen Lacer, MD;  Location: [facility] INVASIVE CV LAB;  Service: Cardiovascular;  Laterality: N/A;   ESOPHAGOGASTRODUODENOSCOPY (EGD) WITH PROPOFOL  N/A 04/24/2020   Procedure: ESOPHAGOGASTRODUODENOSCOPY  (EGD) WITH PROPOFOL ;  Surgeon: Nannette Babe, MD;  Location: [facility] ENDOSCOPY;  Service: Gastroenterology;  Laterality: N/A;   EYE SURGERY  05-1010   bil cataract ,iol   FRACTURE SURGERY     HEMOSTASIS CONTROL  04/24/2020   Procedure: HEMOSTASIS CONTROL;  Surgeon: Nannette Babe, MD;  Location: [facility] ENDOSCOPY;  Service: Gastroenterology;;   I & D EXTREMITY Right 02/23/2015   Procedure: RIGHT RING FINGER REVISION AMPUTATION;  Surgeon: Brunilda Capra, MD;  Location: [facility] OR;  Service: Orthopedics;  Laterality: Right;   KNEE ARTHROSCOPY W/ MENISCAL REPAIR  2000   left knee   LEFT ATRIAL APPENDAGE OCCLUSION N/A 02/20/2023   Procedure: LEFT ATRIAL APPENDAGE OCCLUSION;  Surgeon: Boyce Byes, MD;  Location: [facility] INVASIVE CV LAB;  Service: Cardiovascular;  Laterality: N/A;   LEFT HEART CATH AND CORONARY ANGIOGRAPHY N/A 08/10/2021   Procedure: LEFT HEART CATH AND CORONARY ANGIOGRAPHY;  Surgeon: Arleen Lacer, MD;  Location: [facility] INVASIVE CV LAB;  Service: Cardiovascular;  Laterality: N/A;   LEFT HEART CATH AND CORONARY ANGIOGRAPHY N/A 04/18/2023   Procedure: LEFT HEART  CATH AND CORONARY ANGIOGRAPHY;  Surgeon: Swaziland, Peter M, MD;  Location: [facility] INVASIVE CV LAB;  Service: Cardiovascular;  Laterality: N/A;   LUMBAR EPIDURAL INJECTION  2011   low back pain; DDD   POLYPECTOMY  04/24/2020   Procedure: POLYPECTOMY;  Surgeon: Nannette Babe, MD;  Location: [facility] ENDOSCOPY;  Service: Gastroenterology;;   REVERSE SHOULDER ARTHROPLASTY Left 11/23/2021   Procedure: REVERSE SHOULDER ARTHROPLASTY;  Surgeon: Winston Hawking, MD;  Location: [facility] ORS;  Service: Orthopedics;  Laterality: Left;   ring finger tramatic qamputation Right    SHOULDER ARTHROSCOPY  03/29/2011   Procedure: ARTHROSCOPY SHOULDER;  Surgeon: Lorriane Rote;  Location: [facility] OR;  Service: Orthopedics;  Laterality: Right;  RIGHT SHOULDER ARTHROSCOPY WITH OPEN SUBSCAPULAR REPAIR   SHOULDER OPEN ROTATOR CUFF REPAIR     3 on rt shoulder; 2 on left   TRANSESOPHAGEAL  ECHOCARDIOGRAM (CATH LAB) N/A 02/20/2023   Procedure: TRANSESOPHAGEAL ECHOCARDIOGRAM;  Surgeon: Boyce Byes, MD;  Location: [facility] INVASIVE CV LAB;  Service: Cardiovascular;  Laterality: N/A;   TRANSESOPHAGEAL ECHOCARDIOGRAM (CATH LAB) N/A 04/18/2023   Procedure: TRANSESOPHAGEAL ECHOCARDIOGRAM;  Surgeon: Jann Melody, MD;  Location: [facility] INVASIVE CV LAB;  Service: Cardiovascular;  Laterality: N/A;   TRANSFORAMINAL LUMBAR INTERBODY FUSION (TLIF) WITH PEDICLE SCREW FIXATION 1 LEVEL N/A 04/24/2017   Procedure: TRANSFORAMINAL LUMBAR INTERBODY FUSION (TLIF) L4-5;  Surgeon: Mort Ards, MD;  Location: [facility] OR;  Service: Orthopedics;  Laterality: N/A;  4 hrs    Current Medications: Current Meds  Medication Sig   amiodarone  (PACERONE ) 200 MG tablet Take 200 mg by mouth twice a day for 2 weeks, then decrease to 200 mg daily   amoxicillin  (AMOXIL ) 500 MG tablet TAKE 4 TABS PRIOR TO DENTAL PROCEDURES ONE HOUR PRIOR TO DENTAL PROCEDURES   Ascorbic Acid  (VITAMIN C) 1000 MG tablet Take 1,000 mg by mouth daily.   clopidogrel  (PLAVIX ) 75 MG tablet Take 1 tablet (75 mg total) by mouth daily.   Coenzyme Q10 (COQ10) 50 MG CAPS Take 50 mg by mouth daily.   cyanocobalamin  (VITAMIN B12) 1000 MCG tablet Take 1,000 mcg by mouth daily.   ezetimibe  (ZETIA ) 10 MG tablet Take 1 tablet (10 mg total) by mouth daily.   finasteride  (PROSCAR ) 5 MG tablet Take 5 mg by mouth daily.   Flaxseed, Linseed, (FLAXSEED OIL MAX STR) 1300 MG CAPS Take 1,300 mg by mouth daily.   furosemide  (LASIX ) 80 MG tablet Take 1 tablet (80 mg total) by mouth in the morning. (Patient taking differently: Take 40-80 mg by mouth See admin instructions. 80 mg in the morning, 40 mg in the evening)   gemfibrozil  (LOPID ) 600 MG tablet TAKE 1/2 TABLET(300 MG) BY MOUTH TWICE DAILY BEFORE A MEAL   Glucosamine HCl (GLUCOSAMINE PO) Take 1 tablet by mouth 2 (two) times daily.   isosorbide  mononitrate (IMDUR ) 60 MG 24 hr tablet Take 1 tablet (60 mg  total) by mouth daily.   Multiple Vitamins-Minerals (MULTIVITAMINS THER. W/MINERALS) TABS Take 1 tablet by mouth daily.   nitroGLYCERIN  (NITROSTAT ) 0.4 MG SL tablet PLACE 1 TABLET UNDER THE TONGUE EVERY 5 MINUTES AS NEEDED FOR CHEST PAIN   Omega-3 Fatty Acids (FISH OIL) 1000 MG CAPS Take 1,000 mg by mouth daily.   pantoprazole  (PROTONIX ) 40 MG tablet Take 1 tablet (40 mg total) by mouth daily.   potassium chloride  SA (KLOR-CON  M) 20 MEQ tablet TAKE 2 TABLETS BY MOUTH DAILY THEN TAKE 1 TABLET BY MOUTH AT NIGHT   rosuvastatin  (CRESTOR ) 10 MG tablet  Take 10 mg by mouth daily.   tamsulosin  (FLOMAX ) 0.4 MG CAPS capsule Take 0.4 mg by mouth 2 (two) times daily.   traZODone  (DESYREL ) 100 MG tablet Take 100 mg by mouth at bedtime.   TURMERIC CURCUMIN PO Take 1 capsule by mouth daily.   [DISCONTINUED] metoprolol  succinate (TOPROL -XL) 25 MG 24 hr tablet Take 1 tablet (25 mg total) by mouth 3 (three) times daily. (Patient taking differently: Take 25-50 mg by mouth See admin instructions. Take 50 mg in the morning and 25 mg in the evening)     Allergies:   Amiodarone , Buprenorphine hcl, Codeine, Hydrocodone , Percocet [oxycodone -acetaminophen ], and Sotalol  hcl   Social History   Socioeconomic History   Marital status: Married    Spouse name: Not on file   Number of children: Not on file   Years of education: Not on file   Highest education level: Not on file  Occupational History   Not on file  Tobacco Use   Smoking status: Former    Current packs/day: 0.00    Average packs/day: 1 pack/day for 20.0 years (20.0 ttl pk-yrs)    Types: Cigarettes    Start date: 03/20/1974    Quit date: 03/20/1994    Years since quitting: 29.2   Smokeless tobacco: Former    Types: Chew    Quit date: 03/20/1996  Vaping Use   Vaping status: Never Used  Substance and Sexual Activity   Alcohol  use: Yes    Alcohol /week: 6.0 standard drinks of alcohol     Types: 6 Cans of beer per week    Comment: BEER- DAILY    Drug use: No   Sexual activity: Not on file  Other Topics Concern   Not on file  Social History Narrative   Not on file   Social Drivers of Health   Financial Resource Strain: Not on file  Food Insecurity: Not on file  Transportation Needs: Not on file  Physical Activity: Not on file  Stress: Not on file  Social Connections: Not on file    Family History: The patient's family history includes Cancer in his father and sister; Heart disease in his father and mother.  ROS:   Please see the history of present illness.    All other systems reviewed and are negative.  EKGs/Labs/Other Studies Reviewed:    The following studies were reviewed today:  Cardiac Studies & Procedures   CARDIAC CATHETERIZATION  CARDIAC CATHETERIZATION 04/18/2023  Narrative Nonobstructive CAD. Continued patency of multiple stents Mildly elevated LVEDP 21 mm Hg  Plan; continue medical therapy  Findings Coronary Findings Diagnostic  Dominance: Right  Left Anterior Descending Vessel is moderate in size. Non-stenotic Prox LAD to Mid LAD lesion was previously treated. Mid LAD lesion is 55% stenosed. The lesion is located at the bend. Dist LAD lesion is 50% stenosed. The lesion is located at the bend.  First Diagonal Branch Vessel is small in size. 1st Diag lesion is 60% stenosed. The lesion was previously treated using angioplasty . Rescue PTCA after stenting across  Second Diagonal Branch Vessel is small in size.  Third Diagonal Branch Vessel is small in size.  Left Circumflex Vessel is small. Non-stenotic Prox Cx to Dist Cx lesion was previously treated. Non-stenotic Dist Cx lesion was previously treated. The lesion is distal to major branch, focal, concentric and irregular. Distal to the previous stent  First Obtuse Marginal Branch Vessel is small in size. Non-stenotic 1st Mrg lesion with 100% stenosed side branch in Lat 1st Mrg  was previously treated. The lesion is eccentric and  ulcerative. At very small sidebranch  Left Posterior Atrioventricular Artery Vessel is small in size.  Right Coronary Artery Prox RCA to Mid RCA lesion is 30% stenosed. The lesion was previously treated .  Intervention  No interventions have been documented.   CARDIAC CATHETERIZATION  CARDIAC CATHETERIZATION 08/10/2021  Narrative   Prox RCA to Mid RCA lesion is 30% stenosed.   Previously placed Prox LAD to Mid LAD stent (unknown type) is  widely patent.  Jailed 1st Diag lesion is 60% stenosed.   Previously placed Prox Cx to Dist Cx stent (unknown type) is  widely patent.   Lesion #1: Dist Cx lesion is 90% stenosed.   A drug-eluting stent was successfully placed using a STENT ONYX FRONTIER 2.25X12.  Deployed at 2.35 mm and postdilated to 2.45 mm in the overlap segment.   Post intervention, there is a 0% residual stenosis.   Lesion #2: 1st Mrg lesion is 80% stenosed with 95% stenosed side branch in Lat 1st Mrg.   Mid LAD lesion is 55% stenosed.   Dist LAD lesion is 50% stenosed.   A drug-eluting stent was successfully placed using a STENT ONYX FRONTIER 2.25X1 -> deployed to 2.45 mm.   Post intervention, there is a 0% residual stenosis.  Post intervention, the side branch was reduced to 100% residual stenosis.  (Patient did have chest pain with stent placement, resolved with sublingual nitroglycerin  and fentanyl /Versed .)  Multivessel CAD with 2 culprit lesions: Mid OM1 (85%) at small branch and distal LCx-OM2 (90%) just distal to prior stent.  Mild ISR in RCA stent and ostial diagonal (jailed) 60%.  Tandem 50 to 60% lesions in the mid LAD that are in hinge points.  Not likely flow-limiting. Successful two-vessel PCI of 2 culprit lesions reducing to 0% stenosis each treated with Onyx frontier DES 2.25 mm 12 mm stents dilated to 2.45 mm. Preserved LVEF with low normal roughly 50 to 55%.  Inadequate filling-therefore unable to truly estimate EF and wall motion.  Findings Coronary  Findings Diagnostic  Dominance: Right  Left Anterior Descending Vessel is moderate in size. Previously placed Prox LAD to Mid LAD stent (unknown type) is  widely patent. Mid LAD lesion is 55% stenosed. The lesion is located at the bend. Dist LAD lesion is 50% stenosed. The lesion is located at the bend.  First Diagonal Branch Vessel is small in size. 1st Diag lesion is 60% stenosed. The lesion was previously treated using angioplasty . Rescue PTCA after stenting across  Second Diagonal Branch Vessel is small in size.  Third Diagonal Branch Vessel is small in size.  Left Circumflex Vessel is small. Previously placed Prox Cx to Dist Cx stent (unknown type) is  widely patent. Dist Cx lesion is 90% stenosed. The lesion is distal to major branch, focal, concentric and irregular. Distal to the previous stent  First Obtuse Marginal Branch Vessel is small in size. 1st Mrg lesion is 80% stenosed with 95% stenosed side branch in Lat 1st Mrg. The lesion is eccentric and ulcerative. At very small sidebranch The lesion was not previously treated .  Left Posterior Atrioventricular Artery Vessel is small in size.  Right Coronary Artery Prox RCA to Mid RCA lesion is 30% stenosed. The lesion was previously treated .  Intervention  Prox Cx to Dist Cx lesion Stent (Also treats lesions: Dist Cx) Lesion length:  8 mm. CATH VISTA GUIDE 6FR XB3.5 guide catheter was inserted. Lesion crossed with guidewire using  a WIRE ASAHI PROWATER 180CM. Pre-stent angioplasty was performed using a BALLN SAPPHIRE 2.25X10. Maximum pressure:  10 atm. Inflation time: 20 sec. A drug-eluting stent was successfully placed using a STENT ONYX FRONTIER 2.25X12. Maximum pressure: 14 atm. Inflation time: 30 sec. Stent strut is well apposed. Stent balloon pulled back to the overlapping segment and inflated to high ATM Deployed to 2.35 mm, but that the overlap site dilated to 2.45 mm. Post-stent angioplasty was not performed.  Stent balloon at high ATM for overlap-18 ATM Downstream lesion covered with a overlapping stent, overlapping segment postdilated with stent balloon to high ATM. Post-Intervention Lesion Assessment The intervention was successful. Pre-interventional TIMI flow is 3. Post-intervention TIMI flow is 3. There is a 0% residual stenosis post intervention.  Dist Cx lesion Stent (Also treats lesions: Prox Cx to Dist Cx) See details in Prox Cx to Dist Cx lesion. Post-Intervention Lesion Assessment The intervention was successful. Pre-interventional TIMI flow is 3. Post-intervention TIMI flow is 3. Treated lesion length:  10 mm. No complications occurred at this lesion. There is a 0% residual stenosis post intervention.  1st Mrg lesion with side branch in Lat 1st Mrg Stent - Main Branch Lesion length:  10 mm. CATH VISTA GUIDE 6FR XB3.5 guide catheter was inserted. Lesion crossed with guidewire using a WIRE ASAHI PROWATER 180CM. Pre-stent angioplasty was performed using a BALLN SAPPHIRE 2.25X10. Maximum pressure:  10 atm. Inflation time:  20 sec. A drug-eluting stent was successfully placed using a STENT ONYX FRONTIER 2.25X12. Maximum pressure: 16 atm. Inflation time: 30 sec. Stent strut is well apposed. Deployed to 2.45 mm Post-stent angioplasty was not performed. Same Prowater wire was read directed after PCI of the distal LCx into the OM1 Post-Intervention Lesion Assessment The intervention was successful. Pre-interventional TIMI flow is 3. Post-intervention TIMI flow is 3. Treated lesion length:  12 mm. Very small sidebranch occluded =&gt; 4 out of 10 chest pain, resolved with sublingual nitroglycerin  and second round of fentanyl  and Versed . There is a 0% residual stenosis in the main branch post intervention. There is a 100% residual stenosis in the side branch post intervention.   STRESS TESTS  MYOCARDIAL PERFUSION IMAGING 03/25/2019  Narrative  There was no ST segment deviation noted during  stress.  No T wave inversion was noted during stress.  Nongated study due to atrial fibrillation.  The study is normal.  This is a low risk study.  ECHOCARDIOGRAM  ECHOCARDIOGRAM COMPLETE 01/29/2022  Narrative ECHOCARDIOGRAM REPORT    Patient Name:   Andrew Hardy. Date of Exam: 01/29/2022 Medical Rec #:  [id number]       Height:       66.0 in Accession #:    [id number]      Weight:       204.8 lb Date of Birth:  [birth year]       BSA:          2.020 m Patient Age:    11 years        BP:           94/60 mmHg Patient Gender: M               HR:           92 bpm. Exam Location:  [place]  Procedure: 2D Echo, 3D Echo, Cardiac Doppler, Color Doppler and Strain Analysis  Indications:    Coronary artery disease involving native coronary artery of native heart with angina pectoris (HCC) [I25.119 (ICD-10-CM)]; Persistent atrial fibrillation (  HCC) [Z61.09 (ICD-10-CM)]; Chronic anticoagulation [Z79.01 (ICD-10-CM)]; High risk medication use [Z79.899 (ICD-10-CM)]; Hypertensive heart disease with heart failure (HCC) [I11.0 (ICD-10-CM)]; Mixed hyperlipidemia [E78.2 (ICD-10-CM)]; SOB (shortness of breath) on exertion [R06.02 (ICD-10-CM)]  History:        Patient has prior history of Echocardiogram examinations, most recent 05/04/2019. CAD and Previous Myocardial Infarction, Arrythmias:Atrial Fibrillation, SVT and PVC; Risk Factors:Dyslipidemia and Hypertension.  Sonographer:    Erminia Hazel RDCS Referring Phys: [id number] BRIAN J MUNLEY  IMPRESSIONS   1. Left ventricular ejection fraction, by estimation, is 50 to 55%. Left ventricular ejection fraction by 3D volume is 53 %. The left ventricle has low normal function. The left ventricle has no regional wall motion abnormalities. Left ventricular diastolic parameters are indeterminate. The average left ventricular global longitudinal strain is -12.8 %. The global longitudinal strain is abnormal. 2. Right ventricular systolic function  is normal. The right ventricular size is normal. There is normal pulmonary artery systolic pressure. 3. Left atrial size was mildly dilated. 4. The mitral valve is degenerative. Mild mitral valve regurgitation. No evidence of mitral stenosis. 5. The aortic valve is tricuspid. Aortic valve regurgitation is mild. Aortic valve sclerosis is present, with no evidence of aortic valve stenosis. 6. Aortic normal DTA. 7. The inferior vena cava is dilated in size with >50% respiratory variability, suggesting right atrial pressure of 8 mmHg.  FINDINGS Left Ventricle: Left ventricular ejection fraction, by estimation, is 50 to 55%. Left ventricular ejection fraction by 3D volume is 53 %. The left ventricle has low normal function. The left ventricle has no regional wall motion abnormalities. The average left ventricular global longitudinal strain is -12.8 %. The global longitudinal strain is abnormal. The left ventricular internal cavity size was normal in size. There is no left ventricular hypertrophy. Left ventricular diastolic parameters are indeterminate.  Right Ventricle: The right ventricular size is normal. No increase in right ventricular wall thickness. Right ventricular systolic function is normal. There is normal pulmonary artery systolic pressure. The tricuspid regurgitant velocity is 2.47 m/s, and with an assumed right atrial pressure of 8 mmHg, the estimated right ventricular systolic pressure is 32.4 mmHg.  Left Atrium: Left atrial size was mildly dilated.  Right Atrium: Right atrial size was normal in size.  Pericardium: There is no evidence of pericardial effusion.  Mitral Valve: The mitral valve is degenerative in appearance. Mild mitral annular calcification. Mild mitral valve regurgitation. No evidence of mitral valve stenosis.  Tricuspid Valve: The tricuspid valve is normal in structure. Tricuspid valve regurgitation is mild . No evidence of tricuspid stenosis.  Aortic Valve: The  aortic valve is tricuspid. Aortic valve regurgitation is mild. Aortic regurgitation PHT measures 724 msec. Aortic valve sclerosis is present, with no evidence of aortic valve stenosis.  Pulmonic Valve: The pulmonic valve was normal in structure. Pulmonic valve regurgitation is not visualized. No evidence of pulmonic stenosis.  Aorta: The aortic root and ascending aorta are structurally normal, with no evidence of dilitation, normal DTA and the aortic arch was not well visualized.  Venous: A systolic blunting flow pattern is recorded from the right upper pulmonary vein. The inferior vena cava is dilated in size with greater than 50% respiratory variability, suggesting right atrial pressure of 8 mmHg.  IAS/Shunts: No atrial level shunt detected by color flow Doppler.   LEFT VENTRICLE PLAX 2D LVIDd:         4.20 cm         Diastology LVIDs:  3.00 cm         LV e' medial:    8.38 cm/s LV PW:         1.10 cm         LV E/e' medial:  11.9 LV IVS:        1.20 cm         LV e' lateral:   12.60 cm/s LVOT diam:     2.00 cm         LV E/e' lateral: 7.9 LV SV:         48 LV SV Index:   24              2D LVOT Area:     3.14 cm        Longitudinal Strain 2D Strain GLS  -12.8 % LV Volumes (MOD)               Avg: LV vol d, MOD    74.4 ml A2C:                           3D Volume EF LV vol d, MOD    109.0 ml      LV 3D EF:    Left A4C:                                        ventricul LV vol s, MOD    33.3 ml                    ar A2C:                                        ejection LV vol s, MOD    50.0 ml                    fraction A4C:                                        by 3D LV SV MOD A2C:   41.1 ml                    volume is LV SV MOD A4C:   109.0 ml                   53 %. LV SV MOD BP:    52.7 ml  3D Volume EF: 3D EF:        53 % LV EDV:       118 ml LV ESV:       56 ml LV SV:        62 ml  RIGHT VENTRICLE             IVC RV Basal diam:  3.00 cm     IVC diam: 2.50  cm RV S prime:     13.80 cm/s TAPSE (M-mode): 1.6 cm  LEFT ATRIUM             Index        RIGHT ATRIUM           Index LA diam:  4.10 cm 2.03 cm/m   RA Area:     17.30 cm LA Vol (A2C):   75.8 ml 37.52 ml/m  RA Volume:   37.00 ml  18.32 ml/m LA Vol (A4C):   63.7 ml 31.53 ml/m LA Biplane Vol: 70.6 ml 34.95 ml/m AORTIC VALVE LVOT Vmax:   87.70 cm/s LVOT Vmean:  62.500 cm/s LVOT VTI:    0.152 m AI PHT:      724 msec  AORTA Ao Root diam: 3.10 cm Ao Asc diam:  3.40 cm Ao Desc diam: 2.30 cm  MITRAL VALVE               TRICUSPID VALVE MV Area (PHT): 3.53 cm    TR Peak grad:   24.4 mmHg MV Decel Time: 215 msec    TR Vmax:        247.00 cm/s MR Peak grad: 50.7 mmHg MR Vmax:      356.00 cm/s  SHUNTS MV E velocity: 99.80 cm/s  Systemic VTI:  0.15 m Systemic Diam: 2.00 cm  Zoe Hinds MD Electronically signed by Zoe Hinds MD Signature Date/Time: 01/29/2022/12:40:53 PM    Final  TEE  ECHO TEE 04/18/2023  Narrative TRANSESOPHOGEAL ECHO REPORT    Patient Name:   Andrew Hardy. Date of Exam: 04/18/2023 Medical Rec #:  [id number]       Height:       67.0 in Accession #:    [id number]      Weight:       214.0 lb Date of Birth:  February 10, 1941       BSA:          2.081 m Patient Age:    82 years        BP:           113/73 mmHg Patient Gender: M               HR:           91 bpm. Exam Location:  Outpatient  Procedure: 3D Echo, Transesophageal Echo, Cardiac Doppler and Color Doppler  Indications:     Watchman evaluation  History:         Patient has prior history of Echocardiogram examinations, most recent 02/20/2023.  Sonographer:     Barbra Ley Rgers RDCS Referring Phys:  [id number] Boyce Byes Diagnosing Phys: Gloriann Larger MD  PROCEDURE: After discussion of the risks and benefits of a TEE, an informed consent was obtained from the patient. The transesophogeal probe was passed without difficulty through the esophogus of the patient. Sedation performed  by different physician. The patient developed no complications during the procedure.  IMPRESSIONS   1. Left ventricular ejection fraction, by estimation, is 55 to 60%. The left ventricle has normal function. 2. Right ventricular systolic function is moderately reduced. The right ventricular size is normal. 3. There isa 24 mm Watchman FLX present. No device leak. No DRT. Average compression 14%. Well seated device. Left atrial size was severely dilated. No left atrial/left atrial appendage thrombus was detected. 4. The mitral valve is normal in structure. Mild mitral valve regurgitation. No evidence of mitral stenosis. 5. The aortic valve is tricuspid. Aortic valve regurgitation is mild. Aortic valve sclerosis is present, with no evidence of aortic valve stenosis. 6. There is mild (Grade II) plaque involving the descending aorta. 7. Evidence of atrial level shunting detected by color flow Doppler. Minimal iatrogenic ASD is left.  FINDINGS Left Ventricle: Left ventricular ejection fraction, by estimation,  is 55 to 60%. The left ventricle has normal function. The left ventricular internal cavity size was normal in size.  Right Ventricle: The right ventricular size is normal. No increase in right ventricular wall thickness. Right ventricular systolic function is moderately reduced.  Left Atrium: There isa 24 mm Watchman FLX present. No device leak. No DRT. Average compression 14%. Well seated device. Left atrial size was severely dilated. No left atrial/left atrial appendage thrombus was detected.  Right Atrium: Right atrial size was normal in size.  Pericardium: There is no evidence of pericardial effusion.  Mitral Valve: The mitral valve is normal in structure. Mild mitral valve regurgitation. No evidence of mitral valve stenosis.  Tricuspid Valve: The tricuspid valve is normal in structure. Tricuspid valve regurgitation is mild . No evidence of tricuspid stenosis.  Aortic Valve: The  aortic valve is tricuspid. Aortic valve regurgitation is mild. Aortic valve sclerosis is present, with no evidence of aortic valve stenosis.  Pulmonic Valve: The pulmonic valve was normal in structure. Pulmonic valve regurgitation is trivial. No evidence of pulmonic stenosis.  Aorta: The aortic root and ascending aorta are structurally normal, with no evidence of dilitation. There is mild (Grade II) plaque involving the descending aorta.  IAS/Shunts: The interatrial septum appears to be lipomatous. Evidence of atrial level shunting detected by color flow Doppler.  Gloriann Larger MD Electronically signed by Gloriann Larger MD Signature Date/Time: 04/18/2023/4:24:46 PM    Final (Updated)  MONITORS  LONG TERM MONITOR (3-14 DAYS) 01/29/2022  Narrative Patch Wear Time:  7 days and 10 hours (2023-09-25T08:59:54-0400 to 2023-10-02T19:21:24-0400)  Atrial Fibrillation occurred continuously (100% burden), ranging from 57-177 bpm (avg of 96 bpm). Bundle Branch Block/IVCD was present.  There were no pauses of 3 seconds or greater  Isolated VEs were rare (<1.0%), VE Couplets were rare (<1.0%), and no VE Triplets were present. Ventricular Bigeminy was present.  Heart rate was greater than 110 bpm 26% of the time daytime 2% of the time nighttime.  Her 48 triggered events all associated with atrial fibrillation generally rapid rates greater than 110 bpm and 1 episode with ventricular bigeminy.           EKG:  EKG is ordered today.  The ekg ordered today demonstrates AF with stable rates, no significant change from prior   Recent Labs: 08/13/2022: ALT 10 03/31/2023: BUN 18; Creatinine, Ser 1.20; Hemoglobin 12.2; Platelets 272; Potassium 4.4; Sodium 141  Recent Lipid Panel    Component Value Date/Time   CHOL 136 08/13/2022 0900   TRIG 146 08/13/2022 0900   HDL 54 08/13/2022 0900   CHOLHDL 2.5 08/13/2022 0900   LDLCALC 57 08/13/2022 0900   Risk Assessment/Calculations:     CHA2DS2-VASc Score = 5 [CHF History: 1, HTN History: 1, Diabetes History: 0, Stroke History: 0, Vascular Disease History: 1, Age Score: 2, Gender Score: 0].  Therefore, the patient's annual risk of stroke is 7.2 %.      Physical Exam:    VS:  BP 90/60 (BP Location: Right Arm)   Pulse 92   Ht 5\' 7"  (1.702 m)   Wt 216 lb (98 kg)   SpO2 92%   BMI 33.83 kg/m     Wt Readings from Last 3 Encounters:  06/02/23 216 lb (98 kg)  05/06/23 216 lb 9.6 oz (98.2 kg)  04/18/23 214 lb (97.1 kg)    General: Well developed, well nourished, NAD Lungs:Clear to ausculation bilaterally. No wheezes, rales, or rhonchi. Breathing is unlabored. Cardiovascular: Irregularly irregular. No  murmurs Extremities: No edema. Neuro: Alert and oriented. No focal deficits. No facial asymmetry. MAE spontaneously. Psych: Responds to questions appropriately with normal affect.    ASSESSMENT/PLAN:    Permanent atrial fibrillation: s/p LAAO with Watchman 24mm FLX Pro device. HRs improved today into the low 90s with the addition of amiodarone . Plan to decrease Toprol  from 75mg  daily to 50mg  daily for now due to soft BP (see below). Otherwise continue Plavix  through 6 months post implant. Dental SBE already discussed and sent to pharmacy. Has follow up 4/28 with our team     DOE/SOB with significant hx of CAD: Recent LHC with no acute changes and patent stents. TEE with EF at 55-60% and no valvular disease. Given long hx of chemical exposure, pulmonary referral placed last visit.   Hypotension: Soft BPs at baseline with BP today 90/60. Previously stopped diltiazem  and added Amiodarone . BO remains soft therefore will decrease Toprol  to 50mg  daily.    CKD stage IIIb: Last Cr appears to be at his baseline of 1.2.    HFpEF: Continue current Lasix  dose of 80mg  AM. Appears euvolemic today. No edema.    I spent 20 minutes caring for this patient today including face-to-face discussions, ordering and reviewing labs, reviewing  records from [facility] and other outside facilities, documenting in the record, and arranging for follow up.    Medication Adjustments/Labs and Tests Ordered: Current medicines are reviewed at length with the patient today.  Concerns regarding medicines are outlined above.  Orders Placed This Encounter  Procedures   EKG 12-Lead   Meds ordered this encounter  Medications   metoprolol  succinate (TOPROL -XL) 25 MG 24 hr tablet    Sig: Take 1 tablet (25 mg total) by mouth 2 (two) times daily.    Dispense:  180 tablet    Refill:  2    Patient Instructions  Medication Instructions:  Your physician has recommended you make the following change in your medication:  TAKE: metoprolol  succinate (Toprol  XL) 25 mg by mouth twice daily.  Please monitor your BP.   *If you need a refill on your cardiac medications before your next appointment, please call your pharmacy*   Lab Work: NONE If you have labs (blood work) drawn today and your tests are completely normal, you will receive your results only by: MyChart Message (if you have MyChart) OR A paper copy in the mail If you have any lab test that is abnormal or we need to change your treatment, we will call you to review the results.   Testing/Procedures: NONE   Follow-Up:As scheduled.  At [facility], you and your health needs are our priority.  As part of our continuing mission to provide you with exceptional heart care, we have created designated Provider Care Teams.  These Care Teams include your primary Cardiologist (physician) and Advanced Practice Providers (APPs -  Physician Assistants and Nurse Practitioners) who all work together to provide you with the care you need, when you need it.    Signed, Drema Genta, NP  06/02/2023 8:41 AM    [facility]

## 2023-06-02 ENCOUNTER — Ambulatory Visit: Payer: PPO | Attending: Cardiology | Admitting: Cardiology

## 2023-06-02 VITALS — BP 90/60 | HR 92 | Ht 67.0 in | Wt 216.0 lb

## 2023-06-02 DIAGNOSIS — I4821 Permanent atrial fibrillation: Secondary | ICD-10-CM | POA: Diagnosis not present

## 2023-06-02 DIAGNOSIS — I214 Non-ST elevation (NSTEMI) myocardial infarction: Secondary | ICD-10-CM

## 2023-06-02 DIAGNOSIS — R0602 Shortness of breath: Secondary | ICD-10-CM

## 2023-06-02 DIAGNOSIS — I25119 Atherosclerotic heart disease of native coronary artery with unspecified angina pectoris: Secondary | ICD-10-CM | POA: Diagnosis not present

## 2023-06-02 DIAGNOSIS — Z95818 Presence of other cardiac implants and grafts: Secondary | ICD-10-CM | POA: Diagnosis not present

## 2023-06-02 MED ORDER — METOPROLOL SUCCINATE ER 25 MG PO TB24: 25.0000 mg | ORAL_TABLET | Freq: Two times a day (BID) | ORAL | 2 refills | Status: DC

## 2023-06-02 NOTE — Patient Instructions (Signed)
 Medication Instructions:  Your physician has recommended you make the following change in your medication:  TAKE: metoprolol  succinate (Toprol  XL) 25 mg by mouth twice daily.  Please monitor your BP.   *If you need a refill on your cardiac medications before your next appointment, please call your pharmacy*   Lab Work: NONE If you have labs (blood work) drawn today and your tests are completely normal, you will receive your results only by: MyChart Message (if you have MyChart) OR A paper copy in the mail If you have any lab test that is abnormal or we need to change your treatment, we will call you to review the results.   Testing/Procedures: NONE   Follow-Up:As scheduled.  At [facility], you and your health needs are our priority.  As part of our continuing mission to provide you with exceptional heart care, we have created designated Provider Care Teams.  These Care Teams include your primary Cardiologist (physician) and Advanced Practice Providers (APPs -  Physician Assistants and Nurse Practitioners) who all work together to provide you with the care you need, when you need it.

## 2023-06-03 DIAGNOSIS — M5440 Lumbago with sciatica, unspecified side: Secondary | ICD-10-CM | POA: Diagnosis not present

## 2023-06-03 DIAGNOSIS — M542 Cervicalgia: Secondary | ICD-10-CM | POA: Diagnosis not present

## 2023-06-16 ENCOUNTER — Encounter: Payer: Self-pay | Admitting: Pulmonary Disease

## 2023-06-16 ENCOUNTER — Ambulatory Visit: Payer: PPO | Admitting: Pulmonary Disease

## 2023-06-16 ENCOUNTER — Ambulatory Visit
Admission: RE | Admit: 2023-06-16 | Discharge: 2023-06-16 | Disposition: A | Discharge status: Home / Self Care | Payer: PPO | Source: Ambulatory Visit | Attending: Pulmonary Disease | Admitting: Pulmonary Disease

## 2023-06-16 ENCOUNTER — Ambulatory Visit
Admission: RE | Admit: 2023-06-16 | Discharge: 2023-06-16 | Disposition: A | Discharge status: Home / Self Care | Payer: PPO | Attending: Pulmonary Disease | Admitting: Pulmonary Disease

## 2023-06-16 VITALS — BP 108/80 | HR 94 | Temp 98.0°F | Ht 67.0 in | Wt 216.8 lb

## 2023-06-16 DIAGNOSIS — R0989 Other specified symptoms and signs involving the circulatory and respiratory systems: Secondary | ICD-10-CM | POA: Diagnosis not present

## 2023-06-16 DIAGNOSIS — R0602 Shortness of breath: Secondary | ICD-10-CM

## 2023-06-16 DIAGNOSIS — I5030 Unspecified diastolic (congestive) heart failure: Secondary | ICD-10-CM

## 2023-06-16 MED ORDER — UMECLIDINIUM-VILANTEROL 62.5-25 MCG/ACT IN AEPB: 1.0000 | INHALATION_SPRAY | Freq: Every day | RESPIRATORY_TRACT | 4 refills | Status: DC

## 2023-06-19 NOTE — Progress Notes (Signed)
 Synopsis: Referred in by Filbert Schilder, NP   Subjective:   PATIENT ID: Andrew Hardy. GENDER: male DOB: [birth year], MRN: [id number]  Chief Complaint  Patient presents with   Consult    Shortness of breath on exertion. Occasional cough.     HPI Andrew Hardy is an 83 year old male patient with a past medical history of A.fib s/p watchman placement off ac, PAD CHF, hypertension and hyperlipidemia presenting today to the pulmonary clinic for further evaluation of shortness of breath.   He reports that he had shortness of breath on activity for many years however recently worsened. He denies chest pain or tightness. Denies any historu of asthma and no COVID exposure.   CXR in 2024 - No active cardiopulmonary disease   Echocardiogram 2024 - nl EF 55 to 60%, RV systolic function is reduced, MV is normal AV is normal.   FH - no family history of pulmonary disease   SH - Former smoker smoked 1 PPD for 20 years quit 30 years ago.    ROS All systems were reviewed and were negative except for the above. Objective:   Vitals:   06/16/23 0830  BP: 108/80  Pulse: 94  Temp: 98 F (36.7 C)  TempSrc: Temporal  SpO2: 98%  Weight: 216 lb 12.8 oz (98.3 kg)  Height: 5\' 7"  (1.702 m)   98% on RA BMI Readings from Last 3 Encounters:  06/16/23 33.96 kg/m  06/02/23 33.83 kg/m  05/06/23 33.92 kg/m   Wt Readings from Last 3 Encounters:  06/16/23 216 lb 12.8 oz (98.3 kg)  06/02/23 216 lb (98 kg)  05/06/23 216 lb 9.6 oz (98.2 kg)    Physical Exam GEN: NAD, Healthy Appearing HEENT: Supple Neck, Reactive Pupils, EOMI  CVS: Normal S1, Normal S2, RRR, No murmurs or ES appreciated  Lungs: Bibasilar crackles noted.  Abdomen: Soft, non tender, non distended, + BS  Extremities: Warm and well perfused, No edema  Skin: No suspicious lesions appreciated  Psych: Normal Affect  Ancillary Information   CBC    Component Value Date/Time   WBC 7.1 03/31/2023 0946   WBC 6.0 11/14/2021 0938    RBC 4.25 03/31/2023 0946   RBC 4.61 11/14/2021 0938   HGB 12.2 (L) 03/31/2023 0946   HCT 38.9 03/31/2023 0946   PLT 272 03/31/2023 0946   MCV 92 03/31/2023 0946   MCH 28.7 03/31/2023 0946   MCH 31.7 11/14/2021 0938   MCHC 31.4 (L) 03/31/2023 0946   MCHC 33.2 11/14/2021 0938   RDW 13.9 03/31/2023 0946   LYMPHSABS 1.0 05/15/2020 1115   MONOABS 0.7 05/15/2020 1115   EOSABS 0.1 05/15/2020 1115   BASOSABS 0.1 05/15/2020 1115   Labs and imaging were reviewed.      No data to display           Assessment & Plan:  Andrew Hardy is an 83 year old male patient with a past medical history of A.fib s/p watchman placement off ac, PAD CHF, hypertension and hyperlipidemia presenting today to the pulmonary clinic for further evaluation of shortness of breath.   #Dyspnea on exertion secondary to...  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   Return in about 3 months (around 09/13/2023).  I spent 60  minutes caring for this patient today, including preparing to see the patient, obtaining a medical history , reviewing a separately obtained history, performing a medically appropriate examination and/or evaluation, counseling and educating the patient/family/caregiver, ordering medications, tests, or procedures, documenting clinical information in the electronic health record, and independently interpreting results (not separately reported/billed) and communicating results to the patient/family/caregiver  Janann Colonel, MD [facility] 06/19/2023 11:02 PM

## 2023-07-08 DIAGNOSIS — M5440 Lumbago with sciatica, unspecified side: Secondary | ICD-10-CM | POA: Diagnosis not present

## 2023-07-08 DIAGNOSIS — M542 Cervicalgia: Secondary | ICD-10-CM | POA: Diagnosis not present

## 2023-07-11 ENCOUNTER — Ambulatory Visit: Attending: Otolaryngology

## 2023-07-11 DIAGNOSIS — I4891 Unspecified atrial fibrillation: Secondary | ICD-10-CM | POA: Insufficient documentation

## 2023-07-11 DIAGNOSIS — I509 Heart failure, unspecified: Secondary | ICD-10-CM | POA: Insufficient documentation

## 2023-07-11 DIAGNOSIS — G4733 Obstructive sleep apnea (adult) (pediatric): Secondary | ICD-10-CM | POA: Diagnosis not present

## 2023-07-11 DIAGNOSIS — I110 Hypertensive heart disease with heart failure: Secondary | ICD-10-CM | POA: Diagnosis not present

## 2023-07-15 ENCOUNTER — Encounter: Payer: Self-pay | Admitting: Physician Assistant

## 2023-07-17 DIAGNOSIS — M5440 Lumbago with sciatica, unspecified side: Secondary | ICD-10-CM | POA: Diagnosis not present

## 2023-07-17 DIAGNOSIS — Z6833 Body mass index (BMI) 33.0-33.9, adult: Secondary | ICD-10-CM | POA: Diagnosis not present

## 2023-07-18 DIAGNOSIS — G4733 Obstructive sleep apnea (adult) (pediatric): Secondary | ICD-10-CM | POA: Diagnosis not present

## 2023-07-21 ENCOUNTER — Encounter: Payer: Self-pay | Admitting: Pulmonary Disease

## 2023-07-28 ENCOUNTER — Telehealth (INDEPENDENT_AMBULATORY_CARE_PROVIDER_SITE_OTHER): Payer: Self-pay | Admitting: Sleep Medicine

## 2023-07-28 NOTE — Telephone Encounter (Signed)
 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

## 2023-07-29 ENCOUNTER — Telehealth: Payer: Self-pay

## 2023-07-29 NOTE — Telephone Encounter (Signed)
 Pt scheduled for 6 month Watchman check in on 4/24 with Wallis Bamberg due to Dr Dulce Sellar being out of the office.  The patient called to make [place] aware that this appointment can be cancelled.  The patient refuses to see a provider that does not know him or his medical history. He refuses to see a PA/NP. I advised the patient that Dr Dulce Sellar is out of the office due to an extended time and per the patient "if I did while he is out then they can just bury me." Appointment cancelled with Wallis Bamberg.

## 2023-07-31 DIAGNOSIS — M5416 Radiculopathy, lumbar region: Secondary | ICD-10-CM | POA: Diagnosis not present

## 2023-08-02 NOTE — Addendum Note (Signed)
 Addended by: Annitta Kindler on: 08/02/2023 03:15 PM   Modules accepted: Orders

## 2023-08-05 ENCOUNTER — Ambulatory Visit
Admission: RE | Admit: 2023-08-05 | Discharge: 2023-08-05 | Disposition: A | Discharge status: Home / Self Care | Source: Ambulatory Visit | Attending: Pulmonary Disease | Admitting: Pulmonary Disease

## 2023-08-05 DIAGNOSIS — R918 Other nonspecific abnormal finding of lung field: Secondary | ICD-10-CM | POA: Diagnosis not present

## 2023-08-05 DIAGNOSIS — R0600 Dyspnea, unspecified: Secondary | ICD-10-CM | POA: Diagnosis not present

## 2023-08-05 DIAGNOSIS — R0602 Shortness of breath: Secondary | ICD-10-CM | POA: Diagnosis not present

## 2023-08-06 ENCOUNTER — Ambulatory Visit: Admitting: Sleep Medicine

## 2023-08-06 ENCOUNTER — Encounter: Payer: Self-pay | Admitting: Sleep Medicine

## 2023-08-06 VITALS — BP 100/78 | HR 99 | Temp 98.2°F | Ht 67.0 in | Wt 213.6 lb

## 2023-08-06 DIAGNOSIS — G4733 Obstructive sleep apnea (adult) (pediatric): Secondary | ICD-10-CM

## 2023-08-06 DIAGNOSIS — Z87891 Personal history of nicotine dependence: Secondary | ICD-10-CM | POA: Diagnosis not present

## 2023-08-06 DIAGNOSIS — I10 Essential (primary) hypertension: Secondary | ICD-10-CM | POA: Diagnosis not present

## 2023-08-06 DIAGNOSIS — I4891 Unspecified atrial fibrillation: Secondary | ICD-10-CM

## 2023-08-06 NOTE — Telephone Encounter (Signed)
 Appt has been scheduled for today at 1:30pm.  Nothing further needed.

## 2023-08-06 NOTE — Patient Instructions (Signed)

## 2023-08-06 NOTE — Progress Notes (Signed)
 Name:Andrew D Duve Jr. MRN: [id number] DOB: 1940/12/29   CHIEF COMPLAINT:  PSG F/U   HISTORY OF PRESENT ILLNESS:  Andrew Hardy is a 83 y.o. w/ a h/o HTN, hyperlipidemia, GERD and atrial fibrillation who presents to follow up on split night study results. The patient underwent sleep study on 07/11/23, which revealed severe OSA (AHI 58, O2 nadir 76%).     EPWORTH SLEEP SCORE 1    08/06/2023    1:00 PM  Results of the Epworth flowsheet  Sitting and reading 0  Watching TV 1  Sitting, inactive in a public place (e.g. 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), Chronic left-sided low back pain with sciatica (02/21/2016), Coronary artery disease (2003), Coronary artery disease involving native coronary artery of native heart with angina pectoris (HCC) (10/18/2014), 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), 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), Depression, Dyspnea, Enlarged prostate without lower urinary tract symptoms (luts) (2012), Essential hypertension (10/15/2014), Gastroesophageal reflux disease without esophagitis (04/21/2016), GERD (gastroesophageal reflux disease), Hyperlipidemia, Hypertension, 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), IHD (ischemic heart disease) (10/15/2014), Lower limb pain, inferior, right (12/17/2017), Lumbar radiculopathy (02/21/2016), Myocardial infarction (HCC) (2003), NSTEMI (non-ST elevated myocardial infarction) (HCC) (04/24/2016),  Paroxysmal SVT (supraventricular tachycardia) (HCC) (11/16/2014), Precordial pain (10/15/2014), Presence of Watchman left atrial appendage closure device (02/20/2023), PVC's (premature ventricular contractions) (04/22/2016), RBBB (10/15/2014), S/P angioplasty with stent 08/10/21 with DES to mid OM1 and distal LCX-OM2 (08/11/2021), S/P lumbar fusion (04/24/2017), Sacroiliac joint pain (09/10/2018), Sleep disturbances (01/17/2016), Snoring (01/17/2016), Somatic dysfunction of left sacroiliac joint (06/19/2018), Subscapularis (muscle) sprain (03/29/2011), and 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  has a past surgical history that includes Shoulder open rotator cuff repair; Cardiac catheterization (10-2010); Knee arthroscopy w/ meniscal repair (2000); Coronary angioplasty; Eye surgery (05-1010); Lumbar epidural injection (2011); Shoulder arthroscopy (03/29/2011); I & D extremity (Right, 02/23/2015); Cardiac catheterization (04/23/2016); Fracture surgery; ring finger tramatic qamputation (Right); Transforaminal lumbar interbody fusion (tlif) with pedicle screw fixation 1 level (N/A, 04/24/2017); Colonoscopy with propofol (N/A, 04/24/2020); Esophagogastroduodenoscopy (egd) with propofol (N/A, 04/24/2020); biopsy (04/24/2020); Hemostasis control (04/24/2020); polypectomy (04/24/2020); LEFT HEART CATH AND CORONARY ANGIOGRAPHY (N/A, 08/10/2021); CORONARY STENT INTERVENTION (N/A, 08/10/2021); Reverse shoulder arthroplasty (Left, 11/23/2021); LEFT ATRIAL APPENDAGE OCCLUSION (N/A, 02/20/2023); TRANSESOPHAGEAL ECHOCARDIOGRAM (N/A, 02/20/2023); TRANSESOPHAGEAL ECHOCARDIOGRAM (N/A, 04/18/2023); and LEFT HEART CATH AND CORONARY ANGIOGRAPHY (N/A, 04/18/2023). Prior to Admission medications   Medication Sig Start Date End Date Taking? Authorizing Provider  amiodarone (PACERONE) 200 MG tablet Take 200 mg by mouth twice a day for 2 weeks, then decrease to 200 mg daily 05/06/23  Yes Georgie Chard D, NP  amoxicillin (AMOXIL) 500 MG  tablet TAKE 4 TABS PRIOR TO DENTAL PROCEDURES ONE HOUR PRIOR TO DENTAL PROCEDURES 03/31/23  Yes Georgie Chard D, NP  Ascorbic Acid (VITAMIN C) 1000 MG tablet Take 1,000 mg by mouth daily.   Yes [provider]  clopidogrel (PLAVIX) 75 MG tablet Take 1 tablet (75 mg total)  by mouth daily. 01/17/23  Yes Hassan Links, MD  Coenzyme Q10 (COQ10) 50 MG CAPS Take 50 mg by mouth daily.   Yes [provider]  cyanocobalamin (VITAMIN B12) 1000 MCG tablet Take 1,000 mcg by mouth daily.   Yes [provider]  ezetimibe (ZETIA) 10 MG tablet Take 1 tablet (10 mg total) by mouth daily. 12/20/22  Yes Hassan Links, MD  finasteride (PROSCAR) 5 MG tablet Take 5 mg by mouth daily. 01/17/15  Yes [provider]  Flaxseed, Linseed, (FLAXSEED OIL MAX STR) 1300 MG CAPS Take 1,300 mg by mouth daily.   Yes [provider]  furosemide (LASIX) 80 MG tablet Take 1 tablet (80 mg total) by mouth in the morning. Patient taking differently: Take 40-80 mg by mouth See admin instructions. 80 mg in the morning, 40 mg in the evening 01/14/22  Yes Munley, Margart Shears, MD  gemfibrozil (LOPID) 600 MG tablet TAKE 1/2 TABLET(300 MG) BY MOUTH TWICE DAILY BEFORE A MEAL 03/18/23  Yes Hassan Links, MD  Glucosamine HCl (GLUCOSAMINE PO) Take 1 tablet by mouth 2 (two) times daily.   Yes [provider]  isosorbide mononitrate (IMDUR) 60 MG 24 hr tablet Take 1 tablet (60 mg total) by mouth daily. 01/17/23  Yes Hassan Links, MD  metoprolol succinate (TOPROL-XL) 25 MG 24 hr tablet Take 1 tablet (25 mg total) by mouth 2 (two) times daily. 06/02/23  Yes McDaniel, Jill D, NP  Multiple Vitamins-Minerals (MULTIVITAMINS THER. W/MINERALS) TABS Take 1 tablet by mouth daily.   Yes [provider]  nitroGLYCERIN (NITROSTAT) 0.4 MG SL tablet PLACE 1 TABLET UNDER THE TONGUE EVERY 5 MINUTES AS NEEDED FOR CHEST PAIN 08/26/22  Yes Hassan Links, MD  Omega-3 Fatty Acids (FISH OIL) 1000 MG CAPS Take 1,000  mg by mouth daily.   Yes [provider]  pantoprazole (PROTONIX) 40 MG tablet Take 1 tablet (40 mg total) by mouth daily. 08/12/21  Yes Onetha Bile, NP  potassium chloride SA (KLOR-CON M) 20 MEQ tablet TAKE 2 TABLETS BY MOUTH DAILY THEN TAKE 1 TABLET BY MOUTH AT NIGHT 02/14/23  Yes Hassan Links, MD  rosuvastatin (CRESTOR) 10 MG tablet Take 10 mg by mouth daily.   Yes [provider]  tamsulosin (FLOMAX) 0.4 MG CAPS capsule Take 0.4 mg by mouth 2 (two) times daily.   Yes [provider]  traZODone (DESYREL) 100 MG tablet Take 100 mg by mouth at bedtime. 07/31/19  Yes [provider]  TURMERIC CURCUMIN PO Take 1 capsule by mouth daily.   Yes [provider]  umeclidinium-vilanterol (ANORO ELLIPTA) 62.5-25 MCG/ACT AEPB Inhale 1 puff into the lungs daily. 06/16/23  Yes Assaker, Marianne Shirts, MD   Allergies  Allergen Reactions   Amiodarone     Eye toxicity    Buprenorphine Hcl Itching   Codeine Itching    Can take benadryl with med    Hydrocodone Itching    Can take benadryl with med    Percocet [Oxycodone-Acetaminophen] Itching    Pt.states he can take but must take with benadryl   Sotalol Hcl     Feet burning    FAMILY HISTORY:  family history includes Cancer in his father and sister; Heart disease in his father and mother. SOCIAL HISTORY:  reports that he quit smoking about 29 years ago. His smoking use included cigarettes. He started smoking about 49 years ago. He has a 20 pack-year smoking history. He quit smokeless tobacco use  about 27 years ago.  His smokeless tobacco use included chew. He reports current alcohol use of about 6.0 standard drinks of alcohol per week. He reports that he does not use drugs.   Review of Systems:  Gen:  Denies  fever, sweats, chills weight loss  HEENT: Denies blurred vision, double vision, ear pain, eye pain, hearing loss, nose bleeds, sore throat Cardiac:  No dizziness, chest pain or heaviness, chest  tightness,edema, No JVD Resp:   No cough, -sputum production, -shortness of breath,-wheezing, -hemoptysis,  Gi: Denies swallowing difficulty, stomach pain, nausea or vomiting, diarrhea, constipation, bowel incontinence Gu:  Denies bladder incontinence, burning urine Ext:   Denies Joint pain, stiffness or swelling Skin: Denies  skin rash, easy bruising or bleeding or hives Endoc:  Denies polyuria, polydipsia , polyphagia or weight change Psych:   Denies depression, insomnia or hallucinations  Other:  All other systems negative  VITAL SIGNS: BP 100/78   Pulse 99   Temp 98.2 F (36.8 C) (Oral)   Ht 5\' 7"  (1.702 m)   Wt 213 lb 9.6 oz (96.9 kg)   SpO2 100%   BMI 33.45 kg/m    Physical Examination:   General Appearance: No distress  EYES PERRLA, EOM intact.   NECK Supple, No JVD Pulmonary: normal breath sounds, No wheezing.  CardiovascularNormal S1,S2.  No m/r/g.   Abdomen: Benign, Soft, non-tender. Skin:   warm, no rashes, no ecchymosis  Extremities: normal, no cyanosis, clubbing. Neuro:without focal findings,  speech normal  PSYCHIATRIC: Mood, affect within normal limits.   ASSESSMENT AND PLAN  OSA Reviewed PSG results with patient. Starting on APAP set to 14-20 cm H2O. Discussed the consequences of untreated sleep apnea. Advised not to drive drowsy for safety of patient and others. Will follow up in 3 months to review CPAP efficacy and compliance data.    HTN Stable, on current management. Following with PCP.   Atrial fibrillation Stable, on current management.    Patient  satisfied with Plan of action and management. All questions answered  I spent a total of 32 minutes reviewing chart data, face-to-face evaluation with the patient, counseling and coordination of care as detailed above.    Dawn Kiper, M.D.  Sleep Medicine [facility]

## 2023-08-14 ENCOUNTER — Ambulatory Visit: Admitting: Cardiology

## 2023-08-18 ENCOUNTER — Ambulatory Visit: Payer: Self-pay

## 2023-08-18 ENCOUNTER — Ambulatory Visit: Payer: PPO | Admitting: Physician Assistant

## 2023-08-18 ENCOUNTER — Telehealth: Payer: Self-pay | Admitting: Cardiology

## 2023-08-18 ENCOUNTER — Other Ambulatory Visit: Payer: Self-pay

## 2023-08-18 NOTE — Telephone Encounter (Signed)
 Called the patient and he reported that he was recently started on Toprol  and Amiodarone . Ever since he started taking these medications the bottom of his feet have felt like there is a hot iron pressing against his feet. It feels like his feet are burning. Please advise.

## 2023-08-18 NOTE — Telephone Encounter (Signed)
 Chief Complaint: Medication Reaction Symptoms: Burning in feet Frequency: Since beginning medication Pertinent Negatives: Patient denies muscle spasms Disposition: [] ED /[] Urgent Care (no appt availability in office) / [] Appointment(In office/virtual)/ []  [facility]/ [] Home Care/ [] Refused Recommended Disposition /[] [facility]/ [x]  Follow-up with PCP Additional Notes: Pt reports he was recently started on Amiodarone  200 mg and since beginning this medication he is experiencing burning in his feet. Pt provided with the number for appropriate office for prescriber to report SE. This RN educated pt on home care, new-worsening symptoms, when to call back/seek emergent care. Pt verbalized understanding and agrees to plan.    Copied from CRM [id number]. Topic: Clinical - Red Word Triage >> Aug 18, 2023  9:23 AM Tyronne Galloway wrote: Red Word that prompted transfer to Nurse Triage: Pt stated he may be having an allergic reaction to amiodarone  (PACERONE ) 200 MG tablet. Pt stated he was removed from taking one medication (doesn't remember which one) and has had this symptom ever since taking the amiodarone  (PACERONE ) 200 MG tablet. Reason for Disposition  [1] Caller has medicine question about med NOT prescribed by PCP AND [2] triager unable to answer question (e.g., compatibility with other med, storage)  Answer Assessment - Initial Assessment Questions 1. NAME of MEDICINE: "What medicine(s) are you calling about?"     Amiodarone  200 mg 2. QUESTION: "What is your question?" (e.g., double dose of medicine, side effect)     Medication change 3. PRESCRIBER: "Who prescribed the medicine?" Reason: if prescribed by specialist, call should be referred to that group.     Cardiology 4. SYMPTOMS: "Do you have any symptoms?" If Yes, ask: "What symptoms are you having?"  "How bad are the symptoms (e.g., mild, moderate, severe)     "Feet are burning"  Protocols used: Medication Question  Call-A-AH

## 2023-08-18 NOTE — Telephone Encounter (Signed)
  Pt c/o medication issue:  1. Name of Medication: he couldn't remember   2. How are you currently taking this medication (dosage and times per day)?   3. Are you having a reaction (difficulty breathing--STAT)? No   4. What is your medication issue? Pt said, Drema Genta changed one of his medication, he couldn't remember which one but it causing his feet to feel like its burning he denied swelling but it feels like there's a hot iron pressing against his feet

## 2023-08-19 ENCOUNTER — Other Ambulatory Visit: Payer: Self-pay

## 2023-08-27 ENCOUNTER — Other Ambulatory Visit: Payer: Self-pay

## 2023-08-27 MED ORDER — AMIODARONE HCL 200 MG PO TABS: 200.0000 mg | ORAL_TABLET | Freq: Every day | ORAL | 3 refills | Status: DC

## 2023-09-02 NOTE — Telephone Encounter (Signed)
 Called the patient and informed him of Dr. Tonja Fray recommendation below:  "Reduce amiodarone  210 see if that helps"  Patient verbalized understanding and had no further questions at this time.

## 2023-09-04 ENCOUNTER — Ambulatory Visit: Admitting: Cardiology

## 2023-09-18 DIAGNOSIS — G8929 Other chronic pain: Secondary | ICD-10-CM | POA: Diagnosis not present

## 2023-09-18 DIAGNOSIS — M542 Cervicalgia: Secondary | ICD-10-CM | POA: Diagnosis not present

## 2023-09-18 DIAGNOSIS — M25511 Pain in right shoulder: Secondary | ICD-10-CM | POA: Diagnosis not present

## 2023-09-18 DIAGNOSIS — Z6833 Body mass index (BMI) 33.0-33.9, adult: Secondary | ICD-10-CM | POA: Diagnosis not present

## 2023-09-22 ENCOUNTER — Encounter: Payer: Self-pay | Admitting: Pulmonary Disease

## 2023-09-22 ENCOUNTER — Ambulatory Visit: Admitting: Pulmonary Disease

## 2023-09-22 VITALS — BP 118/78 | HR 109 | Temp 97.1°F | Ht 67.0 in | Wt 213.2 lb

## 2023-09-22 DIAGNOSIS — G4733 Obstructive sleep apnea (adult) (pediatric): Secondary | ICD-10-CM | POA: Diagnosis not present

## 2023-09-22 DIAGNOSIS — R0609 Other forms of dyspnea: Secondary | ICD-10-CM

## 2023-09-22 DIAGNOSIS — Z87891 Personal history of nicotine dependence: Secondary | ICD-10-CM | POA: Diagnosis not present

## 2023-09-22 DIAGNOSIS — I272 Pulmonary hypertension, unspecified: Secondary | ICD-10-CM | POA: Diagnosis not present

## 2023-09-22 DIAGNOSIS — I5030 Unspecified diastolic (congestive) heart failure: Secondary | ICD-10-CM

## 2023-09-22 NOTE — Progress Notes (Signed)
 Synopsis: Referred in by Aloha Arnold, PA-C   Subjective:   PATIENT ID: Andrew Hardy. GENDER: male DOB: [birth year], MRN: [id number]  Chief Complaint  Patient presents with   Follow-up    Bronch on 09/02/2023. Dry cough. DOE. Wheezing.     HPI Andrew Hardy is an 83 year old male patient with a past medical history of A.fib s/p watchman placement off ac, PAD CHF, hypertension and hyperlipidemia presenting today to the pulmonary clinic for further evaluation of shortness of breath.   He reports that he had shortness of breath on activity for many years however recently worsened. He denies chest pain or tightness. Denies any historu of asthma and no COVID exposure.   CXR in 2024 - No active cardiopulmonary disease   Echocardiogram 2024 - nl EF 55 to 60%, RV systolic function is reduced, MV is normal AV is normal.   FH - no family history of pulmonary disease   SH - Former smoker smoked 1 PPD for 20 years quit 30 years ago.   OV 09/22/2023 - Andrew Hardy is here for a follow up visit regarding his dyspnea on exertion. He underwent a sleep study in April 2025 showing severe OSA w/ AHI 58 and nadi spo2 76%. He hasn't started on PAP therapy yet and was hesitant to. I explained to him that this associated with shortness of breath, fatigue and exhaustion as well as increased risk of CV complications. He is willing to try, number for [facility] was provided. Unfortunately, he was not able to do his PFTs but continues to use his anoro. High res CT with ILA.   ROS All systems were reviewed and were negative except for the above. Objective:   Vitals:   09/22/23 0839  BP: 118/78  Pulse: (!) 109  Temp: (!) 97.1 F (36.2 C)  SpO2: 97%  Weight: 213 lb 3.2 oz (96.7 kg)  Height: 5\' 7"  (1.702 m)   97% on RA BMI Readings from Last 3 Encounters:  09/22/23 33.39 kg/m  08/06/23 33.45 kg/m  06/16/23 33.96 kg/m   Wt Readings from Last 3 Encounters:  09/22/23 213 lb 3.2 oz (96.7 kg)  08/06/23 213  lb 9.6 oz (96.9 kg)  06/16/23 216 lb 12.8 oz (98.3 kg)    Physical Exam GEN: NAD, Healthy Appearing HEENT: Supple Neck, Reactive Pupils, EOMI  CVS: Normal S1, Normal S2, RRR, No murmurs or ES appreciated  Lungs: Bibasilar crackles noted.  Abdomen: Soft, non tender, non distended, + BS  Extremities: Warm and well perfused, No edema  Skin: No suspicious lesions appreciated  Psych: Normal Affect  Ancillary Information   CBC    Component Value Date/Time   WBC 7.1 03/31/2023 0946   WBC 6.0 11/14/2021 0938   RBC 4.25 03/31/2023 0946   RBC 4.61 11/14/2021 0938   HGB 12.2 (L) 03/31/2023 0946   HCT 38.9 03/31/2023 0946   PLT 272 03/31/2023 0946   MCV 92 03/31/2023 0946   MCH 28.7 03/31/2023 0946   MCH 31.7 11/14/2021 0938   MCHC 31.4 (L) 03/31/2023 0946   MCHC 33.2 11/14/2021 0938   RDW 13.9 03/31/2023 0946   LYMPHSABS 1.0 05/15/2020 1115   MONOABS 0.7 05/15/2020 1115   EOSABS 0.1 05/15/2020 1115   BASOSABS 0.1 05/15/2020 1115   Labs and imaging were reviewed.      No data to display           Assessment & Plan:  Andrew Hardy is an 83 year old  male patient with a past medical history of A.fib s/p watchman placement off ac, PAD CHF, hypertension and hyperlipidemia presenting today to the pulmonary clinic for further evaluation of shortness of breath.   #Dyspnea on exertion secondary to...  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 Kieran Pellet.  #Suspecting COPD pending PFTs  []  PFTs  []  PAP order placed by Dr. Kieran Pellet.  []  c/w Anoro Ellipta  1 puff daily  []  C/w Albuterol  as needed.   Return in about 4 months (around 01/22/2024).  I spent 20 minutes caring for this patient today, including preparing to see the patient, obtaining a medical history , reviewing a separately obtained history, performing a medically appropriate examination and/or evaluation, counseling and educating the patient/family/caregiver,  ordering medications, tests, or procedures, documenting clinical information in the electronic health record, and independently interpreting results (not separately reported/billed) and communicating results to the patient/family/caregiver  Annitta Kindler, MD [facility] 09/22/2023 9:01 AM

## 2023-10-01 DIAGNOSIS — M47812 Spondylosis without myelopathy or radiculopathy, cervical region: Secondary | ICD-10-CM | POA: Diagnosis not present

## 2023-10-06 ENCOUNTER — Ambulatory Visit: Payer: PPO | Admitting: Pulmonary Disease

## 2023-10-07 DIAGNOSIS — M948X1 Other specified disorders of cartilage, shoulder: Secondary | ICD-10-CM | POA: Diagnosis not present

## 2023-10-07 DIAGNOSIS — M25511 Pain in right shoulder: Secondary | ICD-10-CM | POA: Diagnosis not present

## 2023-10-07 DIAGNOSIS — S46811A Strain of other muscles, fascia and tendons at shoulder and upper arm level, right arm, initial encounter: Secondary | ICD-10-CM | POA: Diagnosis not present

## 2023-10-07 DIAGNOSIS — M19011 Primary osteoarthritis, right shoulder: Secondary | ICD-10-CM | POA: Diagnosis not present

## 2023-10-09 ENCOUNTER — Other Ambulatory Visit: Payer: Self-pay | Admitting: Cardiology

## 2023-10-19 DIAGNOSIS — R0981 Nasal congestion: Secondary | ICD-10-CM | POA: Diagnosis not present

## 2023-10-19 DIAGNOSIS — R070 Pain in throat: Secondary | ICD-10-CM | POA: Diagnosis not present

## 2023-10-19 DIAGNOSIS — R051 Acute cough: Secondary | ICD-10-CM | POA: Diagnosis not present

## 2023-10-26 DIAGNOSIS — I252 Old myocardial infarction: Secondary | ICD-10-CM | POA: Diagnosis not present

## 2023-10-26 DIAGNOSIS — R0789 Other chest pain: Secondary | ICD-10-CM | POA: Diagnosis not present

## 2023-10-26 DIAGNOSIS — I10 Essential (primary) hypertension: Secondary | ICD-10-CM | POA: Diagnosis not present

## 2023-10-26 DIAGNOSIS — I4891 Unspecified atrial fibrillation: Secondary | ICD-10-CM | POA: Diagnosis not present

## 2023-10-26 DIAGNOSIS — Z043 Encounter for examination and observation following other accident: Secondary | ICD-10-CM | POA: Diagnosis not present

## 2023-10-30 DIAGNOSIS — M25511 Pain in right shoulder: Secondary | ICD-10-CM | POA: Diagnosis not present

## 2023-10-30 DIAGNOSIS — Z6831 Body mass index (BMI) 31.0-31.9, adult: Secondary | ICD-10-CM | POA: Diagnosis not present

## 2023-11-03 ENCOUNTER — Telehealth: Payer: Self-pay | Admitting: Cardiology

## 2023-11-03 MED ORDER — FUROSEMIDE 80 MG PO TABS: 80.0000 mg | ORAL_TABLET | Freq: Every morning | ORAL | 3 refills | Status: AC

## 2023-11-03 NOTE — Telephone Encounter (Signed)
 RX sent to requested Pharmacy

## 2023-11-03 NOTE — Telephone Encounter (Signed)
*  STAT* If patient is at the pharmacy, call can be transferred to refill team.   1. Which medications need to be refilled? (please list name of each medication and dose if known)   furosemide  (LASIX ) 80 MG tablet   2. Would you like to learn more about the convenience, safety, & potential cost savings by using the [facility]?   3. Are you open to using the [facility] (Type [facility]. ).  4. Which pharmacy/location (including street and city if local pharmacy) is medication to be sent to?  [facility] [phone] - [place],  - [address]   5. Do they need a 30 day or 90 day supply? 90 day  Wife Mateo) stated patient is completely out of his medication.

## 2023-11-05 ENCOUNTER — Other Ambulatory Visit: Payer: Self-pay | Admitting: Cardiology

## 2023-11-06 ENCOUNTER — Encounter: Payer: Self-pay | Admitting: Sleep Medicine

## 2023-11-06 ENCOUNTER — Telehealth: Payer: Self-pay | Admitting: *Deleted

## 2023-11-06 ENCOUNTER — Ambulatory Visit (INDEPENDENT_AMBULATORY_CARE_PROVIDER_SITE_OTHER): Admitting: Sleep Medicine

## 2023-11-06 ENCOUNTER — Telehealth: Payer: Self-pay | Admitting: Sleep Medicine

## 2023-11-06 VITALS — BP 98/60 | HR 81 | Temp 97.5°F | Resp 20 | Ht 67.0 in | Wt 199.0 lb

## 2023-11-06 DIAGNOSIS — G4733 Obstructive sleep apnea (adult) (pediatric): Secondary | ICD-10-CM

## 2023-11-06 DIAGNOSIS — R911 Solitary pulmonary nodule: Secondary | ICD-10-CM

## 2023-11-06 NOTE — Telephone Encounter (Signed)
 Patient was seen in [facility] ED on 10/26/2023.  Patient/wife concerned about findings on scan.  I called CT at [facility] and had the CT scan pushed through to PACs. Please review scan and advise.  Thank you.

## 2023-11-06 NOTE — Telephone Encounter (Signed)
 I sent urgent message to [facility] and Stacy with [facility] responded Zott, Stacy   when we contacted him to make his setup appt he refused the equipment said he would not wear it.  Should we put the order back on and call him to schedule?

## 2023-11-07 NOTE — Progress Notes (Signed)
 Patient was not seen

## 2023-11-10 ENCOUNTER — Other Ambulatory Visit: Payer: Self-pay | Admitting: *Deleted

## 2023-11-10 MED ORDER — AMIODARONE HCL 200 MG PO TABS: 200.0000 mg | ORAL_TABLET | Freq: Every day | ORAL | 0 refills | Status: AC

## 2023-11-11 NOTE — Telephone Encounter (Signed)
 I have sent urgent message to [facility] letting them know to reopen the New Cpap Setup order for this patient

## 2023-11-12 ENCOUNTER — Other Ambulatory Visit: Payer: Self-pay | Admitting: Cardiology

## 2023-11-13 NOTE — Telephone Encounter (Signed)
 Received message from Conrad with [facility] Zott, Glade   I emailed my intake department to put the order back on and contact the patient for a setup appt. Thank You

## 2023-11-18 ENCOUNTER — Telehealth: Payer: Self-pay

## 2023-11-18 NOTE — Telephone Encounter (Signed)
   Pre-operative Risk Assessment    Patient Name: Rolla Kedzierski.  DOB: 1940/06/04 MRN: [id number]   Date of last office visit: 06/02/23 [facility], NP Date of next office visit: NONE  Request for Surgical Clearance    Procedure:  Dental Extraction - Amount of Teeth to be Pulled:  1 TOOTH #19  Date of Surgery:  Clearance TBD                                Surgeon:  DR LONNI SAX Surgeon's Group or Practice Name:  [facility] Phone number:  [phone] Fax number:  [phone]   Type of Clearance Requested:   - Medical  - Pharmacy:  Hold Clopidogrel  (Plavix )     Type of Anesthesia:  Local    Additional requests/questions:    Signed, Lucie DELENA Ku   11/18/2023, 10:11 AM

## 2023-11-19 DIAGNOSIS — G4700 Insomnia, unspecified: Secondary | ICD-10-CM | POA: Diagnosis not present

## 2023-11-19 DIAGNOSIS — I25118 Atherosclerotic heart disease of native coronary artery with other forms of angina pectoris: Secondary | ICD-10-CM | POA: Diagnosis not present

## 2023-11-19 DIAGNOSIS — M549 Dorsalgia, unspecified: Secondary | ICD-10-CM | POA: Diagnosis not present

## 2023-11-19 DIAGNOSIS — E7800 Pure hypercholesterolemia, unspecified: Secondary | ICD-10-CM | POA: Diagnosis not present

## 2023-11-19 DIAGNOSIS — R918 Other nonspecific abnormal finding of lung field: Secondary | ICD-10-CM | POA: Diagnosis not present

## 2023-11-19 DIAGNOSIS — Z79899 Other long term (current) drug therapy: Secondary | ICD-10-CM | POA: Diagnosis not present

## 2023-11-19 DIAGNOSIS — K219 Gastro-esophageal reflux disease without esophagitis: Secondary | ICD-10-CM | POA: Diagnosis not present

## 2023-11-19 DIAGNOSIS — E1169 Type 2 diabetes mellitus with other specified complication: Secondary | ICD-10-CM | POA: Diagnosis not present

## 2023-11-19 DIAGNOSIS — G4733 Obstructive sleep apnea (adult) (pediatric): Secondary | ICD-10-CM | POA: Diagnosis not present

## 2023-11-19 DIAGNOSIS — N400 Enlarged prostate without lower urinary tract symptoms: Secondary | ICD-10-CM | POA: Diagnosis not present

## 2023-11-19 DIAGNOSIS — G8929 Other chronic pain: Secondary | ICD-10-CM | POA: Diagnosis not present

## 2023-11-19 DIAGNOSIS — I4891 Unspecified atrial fibrillation: Secondary | ICD-10-CM | POA: Diagnosis not present

## 2023-11-19 DIAGNOSIS — Z9181 History of falling: Secondary | ICD-10-CM | POA: Diagnosis not present

## 2023-11-19 NOTE — Telephone Encounter (Signed)
   Patient Name: Andrew Hardy.  DOB: 12/17/1940 MRN: [id number]  Primary Cardiologist: Redell Leiter, MD  Chart reviewed as part of pre-operative protocol coverage.  Simple dental extractions (i.e. 1-2 teeth) are considered low risk procedures per guidelines and generally do not require any specific cardiac clearance. It is also generally accepted that for simple extractions and dental cleanings, there is no need to interrupt blood thinner therapy.   SBE prophylaxis is required for the patient from a cardiac standpoint. Amoxicillin  2000 mg (4-500 mg tablets) should be filled and taken one hour prior to dental procedures.   I will route this recommendation to the requesting party via Epic fax function and remove from pre-op pool.  Please call with questions.  [name], NP 11/19/2023, 7:33 AM

## 2023-11-26 NOTE — Telephone Encounter (Addendum)
 I have notified the patient. He is aware that Donzell will call to get him scheduled for a CT in Sept.  Donzell- please call and scheduled CT in Sept.  Nothing further needed.

## 2023-11-26 NOTE — Telephone Encounter (Signed)
 LMTCB. E2C2 please advise when patient calls back.

## 2023-12-02 ENCOUNTER — Telehealth: Payer: Self-pay

## 2023-12-02 NOTE — Telephone Encounter (Signed)
 I have spoke with Mr. Andrew Hardy and his CT has been scheduled at [facility] on 01/06/24 @ 8:00am. He is aware of the appt and location and I will mail him the appt information

## 2023-12-02 NOTE — Telephone Encounter (Signed)
 Copied from CRM [id number]. Topic: Clinical - Order For Equipment >> Dec 02, 2023  8:36 AM Nathanel DEL wrote: Reason for CRM: pt states he only refused the CPAP b/c they told him it would be $92 dollars. He said he figured medicare would pay for it.SABRAHe said please look into this for him.  He has yet to be fitted for a cpap

## 2023-12-02 NOTE — Telephone Encounter (Signed)
 I have spoke with Mr. [place] and told him I would check into the cost for him to get the cpap. I have sent urgent message to [facility] to see if they can give me the cost break down for the patient

## 2023-12-03 ENCOUNTER — Other Ambulatory Visit: Payer: Self-pay | Admitting: Cardiology

## 2023-12-04 ENCOUNTER — Telehealth: Payer: Self-pay

## 2023-12-04 DIAGNOSIS — M75101 Unspecified rotator cuff tear or rupture of right shoulder, not specified as traumatic: Secondary | ICD-10-CM | POA: Diagnosis not present

## 2023-12-04 DIAGNOSIS — M12811 Other specific arthropathies, not elsewhere classified, right shoulder: Secondary | ICD-10-CM | POA: Diagnosis not present

## 2023-12-04 NOTE — Telephone Encounter (Signed)
   Pre-operative Risk Assessment    Patient Name: Andrew Hardy.  DOB: Apr 14, 1941 MRN: [id number]   Date of last office visit: 12/13/22 Date of next office visit: N/A   Request for Surgical Clearance    Procedure:  Right reverse total shoulder arthroplasty  Date of Surgery:  Clearance TBD                                Surgeon:  Dr. Elspeth Her Surgeon's Group or Practice Name:  [facility] Phone number:  [phone] Fax number:  [phone]   Type of Clearance Requested:   - Medical    Type of Anesthesia:  Choice   Additional requests/questions:    [name]   12/04/2023, 3:32 PM

## 2023-12-04 NOTE — Telephone Encounter (Signed)
 Hardy, Andrew   He has a 20% [employer] on his plan and they rent the equipment for 13 months.  Due at setup would be $92.48 this is his 20% responsibility for the first months rental and all the supplies for the machine.  The next 2 months his 20% would be $12.47 for each month, if he met compliance within this 90 day period and had his follow-up visit to discuss using and benefitting his machine would then continue to rent for the remaining 10 months at that $12.47.  He would also have the 20% for any supplies that he ordered going forward.   This is a lot of info so if you need to speak to me to clarify anything my direct line is [phone]. thank you Andrew

## 2023-12-04 NOTE — Telephone Encounter (Signed)
 I have spoke with Mr. Andrew Hardy and explained what was sent to me from [facility]. Mr. Andrew Hardy wants to hold off on doing the cpap setup for now. He now has an extra expense due to breaking off a crown. He is interested in checking out the oral appliance. Would Dr. Jess be willing to place a referral to Dr. Oneil Forget in [place]

## 2023-12-05 NOTE — Telephone Encounter (Signed)
   Name: Andrew Hardy.  DOB: 04/28/1940  MRN: [id number]  Primary Cardiologist: Redell Leiter, MD  Chart reviewed as part of pre-operative protocol coverage. Because of Andrew FLEAGLE Jr.'s past medical history and time since last visit, he will require a follow-up in-office visit in order to better assess preoperative cardiovascular risk. Last seen by general cardiology 01/2023. Has been seen intermittently by EP who does not give clearance.   Pre-op covering staff: - Please schedule appointment and call patient to inform them. If patient already had an upcoming appointment within acceptable timeframe, please add pre-op clearance to the appointment notes so provider is aware. - Please contact requesting surgeon's office via preferred method (i.e, phone, fax) to inform them of need for appointment prior to surgery.  [name], NP  12/05/2023, 9:07 AM

## 2023-12-05 NOTE — Telephone Encounter (Signed)
 1st attempt to reach pt regarding surgical clearance and the need for an IN OFFICE appointment.  Left pt a detailed message to call back and get that scheduled.

## 2023-12-08 ENCOUNTER — Other Ambulatory Visit: Payer: Self-pay | Admitting: Cardiology

## 2023-12-08 NOTE — Telephone Encounter (Signed)
 Pt scheduled for OV on 12/19/23.

## 2023-12-11 NOTE — Telephone Encounter (Signed)
 Per Dr. Jess, oral appliances are for patient's with mild to moderate sleep apnea, he has severe OSA (AHI is 58.7). She suggest CPAP and weight loss for his sleep apnea.  I have notified the patient. He said he would not try the oral appliance in that case. I asked if he would like to try the CPAP machine and he said no I don't.  Just an FYI Dr. Jess.  Nothing further needed.

## 2023-12-18 NOTE — Progress Notes (Unsigned)
 Cardiology Office Note   Date:  12/19/2023  ID:  Andrew Hartsell., DOB 1940/05/16, MRN [id number] PCP: Montey Lot, PA-C  [facility] Providers Cardiologist:  Redell Leiter, MD Electrophysiologist:  Will Gladis Norton, MD     History of Present Illness [name] is a 83 y.o. male with a past medical history of atrial fibrillation/flutter with presence of Watchman device, CAD s/p multiple PCI's, HFpEF, hypertension, SVT, RBBB, Barrett's esophagus, GERD  04/18/2023 left heart cath nonobstructive CAD, stents widely patent 04/18/2023 TEE EF 55-60, Watchman present well-seated, mild MR, mild AR with sclerosis without stenosis, evidence of atrial level shunting 02/20/2023 Watchman device with LAAO device 08/10/2021 cardiac cath DES to proximal circumflex overlapping previously placed stent; DES to first marginal 04/22/2016 left heart cath DES to left circumflex 10/16/2014 DES to P LAD and PTCA of the ostial first diagonal  He has a longstanding patient of Dr. Leiter for the management of atrial fibrillation and CAD.  He has had several PCIs and multiple interventions.  Regarding his atrial fibrillation he had a history of GI bleeds along with the use of his antiplatelets he was referred to EP for consideration of LAAO with Watchman device and under went implantation on 02/20/2023.  Most recently he was evaluated by Kate Minus, NP, he was overall stable from a cardiac perspective but dealing with ongoing shortness of breath and it appeared as though a referral has been placed to pulmonology.  He presents today for preoperative evaluation for upcoming right shoulder repair.  He has been doing well since he was last evaluated in our office and does not have any formal complaints from a cardiac perspective.  He is somewhat frustrated that he was transitioned to amiodarone  and subsequently developed hypothyroidism and would like to transition back to what he was on before.  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 He denies chest pain, palpitations, dyspnea, pnd, orthopnea, n, v, dizziness, syncope, edema, weight gain, or early satiety.    ROS: Review of Systems  Musculoskeletal:  Positive for back pain, joint pain and neck pain.  All other systems reviewed and are negative.    Studies Reviewed      Cardiac Studies & Procedures   ______________________________________________________________________________________________ CARDIAC CATHETERIZATION  CARDIAC CATHETERIZATION 04/18/2023  Conclusion Nonobstructive CAD. Continued patency of multiple stents Mildly elevated LVEDP 21 mm Hg  Plan; continue medical therapy  Findings Coronary Findings Diagnostic  Dominance: Right  Left Anterior Descending Vessel is moderate in size. Non-stenotic Prox LAD to Mid LAD lesion was previously treated. Mid LAD lesion is 55% stenosed. The lesion is located at the bend. Dist LAD lesion is 50% stenosed. The lesion is located at the bend.  First Diagonal Branch Vessel is small in size. 1st Diag lesion is 60% stenosed. The lesion was previously treated using angioplasty . Rescue PTCA after stenting across  Second Diagonal Branch Vessel is small in size.  Third Diagonal Branch Vessel is small in size.  Left Circumflex Vessel is small. Non-stenotic Prox Cx to Dist Cx lesion was previously treated. Non-stenotic Dist Cx lesion was previously treated. The lesion is distal to major branch, focal, concentric and irregular. Distal to the previous stent  First Obtuse Marginal Branch Vessel is small in size. Non-stenotic 1st Mrg lesion with 100% stenosed side branch in Lat 1st Mrg was previously treated. The lesion is eccentric and ulcerative. At  very small sidebranch  Left Posterior Atrioventricular Artery Vessel is small in size.  Right Coronary Artery Prox RCA to Mid RCA  lesion is 30% stenosed. The lesion was previously treated .  Intervention  No interventions have been documented.   CARDIAC CATHETERIZATION  CARDIAC CATHETERIZATION 08/10/2021  Conclusion   Prox RCA to Mid RCA lesion is 30% stenosed.   Previously placed Prox LAD to Mid LAD stent (unknown type) is  widely patent.  Jailed 1st Diag lesion is 60% stenosed.   Previously placed Prox Cx to Dist Cx stent (unknown type) is  widely patent.   Lesion #1: Dist Cx lesion is 90% stenosed.   A drug-eluting stent was successfully placed using a STENT ONYX FRONTIER 2.25X12.  Deployed at 2.35 mm and postdilated to 2.45 mm in the overlap segment.   Post intervention, there is a 0% residual stenosis.   Lesion #2: 1st Mrg lesion is 80% stenosed with 95% stenosed side branch in Lat 1st Mrg.   Mid LAD lesion is 55% stenosed.   Dist LAD lesion is 50% stenosed.   A drug-eluting stent was successfully placed using a STENT ONYX FRONTIER 2.25X1 -> deployed to 2.45 mm.   Post intervention, there is a 0% residual stenosis.  Post intervention, the side branch was reduced to 100% residual stenosis.  (Patient did have chest pain with stent placement, resolved with sublingual nitroglycerin  and fentanyl /Versed .)  Multivessel CAD with 2 culprit lesions: Mid OM1 (85%) at small branch and distal LCx-OM2 (90%) just distal to prior stent.  Mild ISR in RCA stent and ostial diagonal (jailed) 60%.  Tandem 50 to 60% lesions in the mid LAD that are in hinge points.  Not likely flow-limiting. Successful two-vessel PCI of 2 culprit lesions reducing to 0% stenosis each treated with Onyx frontier DES 2.25 mm 12 mm stents dilated to 2.45 mm. Preserved LVEF with low normal roughly 50 to 55%.  Inadequate filling-therefore unable to truly estimate EF and wall motion.  Findings Coronary Findings Diagnostic  Dominance: Right  Left Anterior Descending Vessel is moderate in size. Previously placed Prox LAD to Mid LAD stent (unknown type) is   widely patent. Mid LAD lesion is 55% stenosed. The lesion is located at the bend. Dist LAD lesion is 50% stenosed. The lesion is located at the bend.  First Diagonal Branch Vessel is small in size. 1st Diag lesion is 60% stenosed. The lesion was previously treated using angioplasty . Rescue PTCA after stenting across  Second Diagonal Branch Vessel is small in size.  Third Diagonal Branch Vessel is small in size.  Left Circumflex Vessel is small. Previously placed Prox Cx to Dist Cx stent (unknown type) is  widely patent. Dist Cx lesion is 90% stenosed. The lesion is distal to major branch, focal, concentric and irregular. Distal to the previous stent  First Obtuse Marginal Branch Vessel is small in size. 1st Mrg lesion is 80% stenosed with 95% stenosed side branch in Lat 1st Mrg. The lesion is eccentric and ulcerative. At very small sidebranch The lesion was not previously treated .  Left Posterior Atrioventricular Artery Vessel is small in size.  Right Coronary Artery Prox RCA to Mid RCA lesion is 30% stenosed. The lesion was previously treated .  Intervention  Prox Cx to Dist Cx lesion Stent (Also treats lesions: Dist Cx) Lesion length:  8 mm. CATH VISTA GUIDE 6FR XB3.5 guide catheter was inserted. Lesion crossed with guidewire using a WIRE ASAHI PROWATER 180CM. Pre-stent angioplasty was performed using  a BALLN SAPPHIRE 2.25X10. Maximum pressure:  10 atm. Inflation time: 20 sec. A drug-eluting stent was successfully placed using a STENT ONYX FRONTIER 2.25X12. Maximum pressure: 14 atm. Inflation time: 30 sec. Stent strut is well apposed. Stent balloon pulled back to the overlapping segment and inflated to high ATM Deployed to 2.35 mm, but that the overlap site dilated to 2.45 mm. Post-stent angioplasty was not performed. Stent balloon at high ATM for overlap-18 ATM Downstream lesion covered with a overlapping stent, overlapping segment postdilated with stent balloon to high  ATM. Post-Intervention Lesion Assessment The intervention was successful. Pre-interventional TIMI flow is 3. Post-intervention TIMI flow is 3. There is a 0% residual stenosis post intervention.  Dist Cx lesion Stent (Also treats lesions: Prox Cx to Dist Cx) See details in Prox Cx to Dist Cx lesion. Post-Intervention Lesion Assessment The intervention was successful. Pre-interventional TIMI flow is 3. Post-intervention TIMI flow is 3. Treated lesion length:  10 mm. No complications occurred at this lesion. There is a 0% residual stenosis post intervention.  1st Mrg lesion with side branch in Lat 1st Mrg Stent - Main Branch Lesion length:  10 mm. CATH VISTA GUIDE 6FR XB3.5 guide catheter was inserted. Lesion crossed with guidewire using a WIRE ASAHI PROWATER 180CM. Pre-stent angioplasty was performed using a BALLN SAPPHIRE 2.25X10. Maximum pressure:  10 atm. Inflation time:  20 sec. A drug-eluting stent was successfully placed using a STENT ONYX FRONTIER 2.25X12. Maximum pressure: 16 atm. Inflation time: 30 sec. Stent strut is well apposed. Deployed to 2.45 mm Post-stent angioplasty was not performed. Same Prowater wire was read directed after PCI of the distal LCx into the OM1 Post-Intervention Lesion Assessment The intervention was successful. Pre-interventional TIMI flow is 3. Post-intervention TIMI flow is 3. Treated lesion length:  12 mm. Very small sidebranch occluded => 4 out of 10 chest pain, resolved with sublingual nitroglycerin  and second round of fentanyl  and Versed . There is a 0% residual stenosis in the main branch post intervention. There is a 100% residual stenosis in the side branch post intervention.   STRESS TESTS  MYOCARDIAL PERFUSION IMAGING 03/25/2019  Interpretation Summary  There was no ST segment deviation noted during stress.  No T wave inversion was noted during stress.  Nongated study due to atrial fibrillation.  The study is normal.  This is a low risk  study.   ECHOCARDIOGRAM  ECHOCARDIOGRAM COMPLETE 01/29/2022  Narrative ECHOCARDIOGRAM REPORT    Patient Name:   Andrew Wilmer. Date of Exam: 01/29/2022 Medical Rec #:  [id number]       Height:       66.0 in Accession #:    [id number]      Weight:       204.8 lb Date of Birth:  [birth year]       BSA:          2.020 m Patient Age:    39 years        BP:           94/60 mmHg Patient Gender: M               HR:           92 bpm. 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 CAD and Previous Myocardial Infarction, Arrythmias:Atrial Fibrillation, SVT and PVC; Risk Factors:Dyslipidemia and Hypertension.  Sonographer:    Charlie Jointer RDCS Referring Phys: [id number] BRIAN J MUNLEY  IMPRESSIONS   1. Left ventricular ejection fraction, by estimation, is 50 to 55%. Left ventricular ejection fraction by 3D volume is 53 %. The left ventricle has low normal function. The left ventricle has no regional wall motion abnormalities. Left ventricular diastolic parameters are indeterminate. The average left ventricular global longitudinal strain is -12.8 %. The global longitudinal strain is abnormal. 2. Right ventricular systolic function is normal. The right ventricular size is normal. There is normal pulmonary artery systolic pressure. 3. Left atrial size was mildly dilated. 4.  The mitral valve is degenerative. Mild mitral valve regurgitation. No evidence of mitral stenosis. 5. The aortic valve is tricuspid. Aortic valve regurgitation is mild. Aortic valve sclerosis is present, with no evidence of aortic valve stenosis. 6. Aortic normal DTA. 7. The inferior vena cava is dilated in size with >50% respiratory variability, suggesting right atrial pressure of 8 mmHg.  FINDINGS Left Ventricle: Left ventricular ejection fraction, by estimation, is 50 to 55%. Left ventricular ejection fraction by 3D volume is 53 %. The left ventricle has low normal function. The left ventricle has no regional wall motion abnormalities. The average left ventricular global longitudinal strain is -12.8 %. The global longitudinal strain is abnormal. The left ventricular internal cavity size was normal in size. There is no left ventricular hypertrophy. Left ventricular diastolic parameters are indeterminate.  Right Ventricle: The right ventricular size is normal. No increase in right ventricular wall thickness. Right ventricular systolic function is normal. There is normal pulmonary artery systolic pressure. The tricuspid regurgitant velocity is 2.47 m/s, and with an assumed right atrial pressure of 8 mmHg, the estimated right ventricular systolic pressure is 32.4 mmHg.  Left Atrium: Left atrial size was mildly dilated.  Right Atrium: Right atrial size was normal in size.  Pericardium: There is no evidence of pericardial effusion.  Mitral Valve: The mitral valve is degenerative in appearance. Mild mitral annular calcification. Mild mitral valve regurgitation. No evidence of mitral valve stenosis.  Tricuspid Valve: The tricuspid valve is normal in structure. Tricuspid valve regurgitation is mild . No evidence of tricuspid stenosis.  Aortic Valve: The aortic valve is tricuspid. Aortic valve regurgitation is mild. Aortic regurgitation PHT measures 724 msec. Aortic valve sclerosis is present, with  no evidence of aortic valve stenosis.  Pulmonic Valve: The pulmonic valve was normal in structure. Pulmonic valve regurgitation is not visualized. No evidence of pulmonic stenosis.  Aorta: The aortic root and ascending aorta are structurally normal, with no evidence of dilitation, normal DTA and the aortic arch was not well visualized.  Venous: A systolic blunting flow pattern is recorded from the right upper pulmonary vein. The inferior vena cava is dilated in size with greater than 50% respiratory variability, suggesting right atrial pressure of 8 mmHg.  IAS/Shunts: No atrial level shunt detected by color flow Doppler.   LEFT VENTRICLE PLAX 2D LVIDd:         4.20 cm         Diastology LVIDs:         3.00 cm  LV e' medial:    8.38 cm/s LV PW:         1.10 cm         LV E/e' medial:  11.9 LV IVS:        1.20 cm         LV e' lateral:   12.60 cm/s LVOT diam:     2.00 cm         LV E/e' lateral: 7.9 LV SV:         48 LV SV Index:   24              2D LVOT Area:     3.14 cm        Longitudinal Strain 2D Strain GLS  -12.8 % LV Volumes (MOD)               Avg: LV vol d, MOD    74.4 ml A2C:                           3D Volume EF LV vol d, MOD    109.0 ml      LV 3D EF:    Left A4C:                                        ventricul LV vol s, MOD    33.3 ml                    ar A2C:                                        ejection LV vol s, MOD    50.0 ml                    fraction A4C:                                        by 3D LV SV MOD A2C:   41.1 ml                    volume is LV SV MOD A4C:   109.0 ml                   53 %. LV SV MOD BP:    52.7 ml  3D Volume EF: 3D EF:        53 % LV EDV:       118 ml LV ESV:       56 ml LV SV:        62 ml  RIGHT VENTRICLE             IVC RV Basal diam:  3.00 cm     IVC diam: 2.50 cm RV S prime:     13.80 cm/s TAPSE (M-mode): 1.6 cm  LEFT ATRIUM             Index        RIGHT ATRIUM           Index LA diam:        4.10 cm  2.03 cm/m  RA Area:     17.30 cm LA Vol (A2C):   75.8 ml 37.52 ml/m  RA Volume:   37.00 ml  18.32 ml/m LA Vol (A4C):   63.7 ml 31.53 ml/m LA Biplane Vol: 70.6 ml 34.95 ml/m AORTIC VALVE LVOT Vmax:   87.70 cm/s LVOT Vmean:  62.500 cm/s LVOT VTI:    0.152 m AI PHT:      724 msec  AORTA Ao Root diam: 3.10 cm Ao Asc diam:  3.40 cm Ao Desc diam: 2.30 cm  MITRAL VALVE               TRICUSPID VALVE MV Area (PHT): 3.53 cm    TR Peak grad:   24.4 mmHg MV Decel Time: 215 msec    TR Vmax:        247.00 cm/s MR Peak grad: 50.7 mmHg MR Vmax:      356.00 cm/s  SHUNTS MV E velocity: 99.80 cm/s  Systemic VTI:  0.15 m Systemic Diam: 2.00 cm  Redell Leiter MD Electronically signed by Redell Leiter MD Signature Date/Time: 01/29/2022/12:40:53 PM    Final   TEE  ECHO TEE 04/18/2023  Narrative TRANSESOPHOGEAL ECHO REPORT    Patient Name:   Andrew Tieu. Date of Exam: 04/18/2023 Medical Rec #:  [id number]       Height:       67.0 in Accession #:    [id number]      Weight:       214.0 lb Date of Birth:  1940-08-18       BSA:          2.081 m Patient Age:    82 years        BP:           113/73 mmHg Patient Gender: M               HR:           91 bpm. Exam Location:  Outpatient  Procedure: 3D Echo, Transesophageal Echo, Cardiac Doppler and Color Doppler  Indications:     Watchman evaluation  History:         Patient has prior history of Echocardiogram examinations, most recent 02/20/2023.  Sonographer:     Tinnie Rgers RDCS Referring Phys:  [id number] OLE ONEIDA HOLTS Diagnosing Phys: Stanly Leavens MD  PROCEDURE: After discussion of the risks and benefits of a TEE, an informed consent was obtained from the patient. The transesophogeal probe was passed without difficulty through the esophogus of the patient. Sedation performed by different physician. The patient developed no complications during the procedure.  IMPRESSIONS   1. Left ventricular ejection fraction,  by estimation, is 55 to 60%. The left ventricle has normal function. 2. Right ventricular systolic function is moderately reduced. The right ventricular size is normal. 3. There isa 24 mm Watchman FLX present. No device leak. No DRT. Average compression 14%. Well seated device. Left atrial size was severely dilated. No left atrial/left atrial appendage thrombus was detected. 4. The mitral valve is normal in structure. Mild mitral valve regurgitation. No evidence of mitral stenosis. 5. The aortic valve is tricuspid. Aortic valve regurgitation is mild. Aortic valve sclerosis is present, with no evidence of aortic valve stenosis. 6. There is mild (Grade II) plaque involving the descending aorta. 7. Evidence of atrial level shunting detected by color flow Doppler. Minimal iatrogenic ASD is left.  FINDINGS Left Ventricle: Left ventricular ejection fraction, by estimation, is 55 to 60%. The  left ventricle has normal function. The left ventricular internal cavity size was normal in size.  Right Ventricle: The right ventricular size is normal. No increase in right ventricular wall thickness. Right ventricular systolic function is moderately reduced.  Left Atrium: There isa 24 mm Watchman FLX present. No device leak. No DRT. Average compression 14%. Well seated device. Left atrial size was severely dilated. No left atrial/left atrial appendage thrombus was detected.  Right Atrium: Right atrial size was normal in size.  Pericardium: There is no evidence of pericardial effusion.  Mitral Valve: The mitral valve is normal in structure. Mild mitral valve regurgitation. No evidence of mitral valve stenosis.  Tricuspid Valve: The tricuspid valve is normal in structure. Tricuspid valve regurgitation is mild . No evidence of tricuspid stenosis.  Aortic Valve: The aortic valve is tricuspid. Aortic valve regurgitation is mild. Aortic valve sclerosis is present, with no evidence of aortic valve  stenosis.  Pulmonic Valve: The pulmonic valve was normal in structure. Pulmonic valve regurgitation is trivial. No evidence of pulmonic stenosis.  Aorta: The aortic root and ascending aorta are structurally normal, with no evidence of dilitation. There is mild (Grade II) plaque involving the descending aorta.  IAS/Shunts: The interatrial septum appears to be lipomatous. Evidence of atrial level shunting detected by color flow Doppler.  Stanly Leavens MD Electronically signed by Stanly Leavens MD Signature Date/Time: 04/18/2023/4:24:46 PM    Final (Updated)  MONITORS  LONG TERM MONITOR (3-14 DAYS) 01/29/2022  Narrative Patch Wear Time:  7 days and 10 hours (2023-09-25T08:59:54-0400 to 2023-10-02T19:21:24-0400)  Atrial Fibrillation occurred continuously (100% burden), ranging from 57-177 bpm (avg of 96 bpm). Bundle Branch Block/IVCD was present.  There were no pauses of 3 seconds or greater  Isolated VEs were rare (<1.0%), VE Couplets were rare (<1.0%), and no VE Triplets were present. Ventricular Bigeminy was present.  Heart rate was greater than 110 bpm 26% of the time daytime 2% of the time nighttime.  Her 48 triggered events all associated with atrial fibrillation generally rapid rates greater than 110 bpm and 1 episode with ventricular bigeminy.       ______________________________________________________________________________________________      Risk Assessment/Calculations  CHA2DS2-VASc Score = 5   This indicates a 7.2% annual risk of stroke. The patient's score is based upon: CHF History: 1 HTN History: 1 Diabetes History: 0 Stroke History: 0 Vascular Disease History: 1 Age Score: 2 Gender Score: 0            Physical Exam VS:  BP 124/80   Pulse 98   Ht 5' 7 (1.702 m)   Wt 203 lb 12.8 oz (92.4 kg)   SpO2 97%   BMI 31.92 kg/m        Wt Readings from Last 3 Encounters:  12/19/23 203 lb 12.8 oz (92.4 kg)  11/06/23 199 lb (90.3 kg)   09/22/23 213 lb 3.2 oz (96.7 kg)    GEN: Well nourished, well developed in no acute distress NECK: No JVD; No carotid bruits CARDIAC: RRR, no murmurs, rubs, gallops RESPIRATORY:  Clear to auscultation without rales, wheezing or rhonchi  ABDOMEN: Soft, non-tender, non-distended EXTREMITIES:  No edema; No deformity   ASSESSMENT AND PLAN Permanent atrial fibrillation-s/p Watchman device and LAAO, his rate is controlled, continue amiodarone  200 mg daily, Toprol  25 mg twice daily.  He is interested in stopping his amiodarone  and resuming his Cardizem  dose, spoke with his primary cardiologist and he recommends to continue amiodarone  until after his upcoming surgery and they will  discuss discontinuing at the next office visit.  Lab work by his PCP on 11/20/2023 revealed normal LFTs, TSH elevated at 7.81-this is followed by his PCP.  CAD-multiple interventions as outlined above, with most recent left heart cath revealing patency of his stents.  Stable with no anginal symptoms. No indication for ischemic evaluation.  Continue Plavix  75 mg daily, Zetia  10 mg daily, Toprol  25 mg twice daily, Imdur  60 mg daily, nitroglycerin  as needed, Crestor  10 mg daily.  HFpEF-NYHA class I, euvolemic, continue Lasix  80 mg daily, Toprol  25 mg twice daily.  Dyslipidemia-followed by his PCP with most recent LDL on 11/20/2023 controlled at 64, continue Zetia  10 mg daily, Lopid  300 mg daily, Crestor  10 mg daily.  Preoperative evaluation- According to the Revised Cardiac Risk Index (RCRI), his Perioperative Risk of Major Cardiac Event is (%): 6.6 His Functional Capacity in METs is: 6.61 according to the Duke Activity Status Index (DASI). Therefore, based on ACC/AHA guidelines, patient would be at acceptable risk for the planned procedure without further cardiovascular testing. I will route this recommendation to the requesting party via Epic fax function.        Dispo: Follow-up in 3 months with Dr. Monetta, purpose of this  visit is postoperatively so he can discuss discontinuation of his amiodarone .  Signed, Delon JAYSON Hoover, NP

## 2023-12-19 ENCOUNTER — Ambulatory Visit: Attending: Cardiology | Admitting: Cardiology

## 2023-12-19 ENCOUNTER — Encounter: Payer: Self-pay | Admitting: Cardiology

## 2023-12-19 VITALS — BP 124/80 | HR 98 | Ht 67.0 in | Wt 203.8 lb

## 2023-12-19 DIAGNOSIS — Z01818 Encounter for other preprocedural examination: Secondary | ICD-10-CM

## 2023-12-19 DIAGNOSIS — Z95818 Presence of other cardiac implants and grafts: Secondary | ICD-10-CM

## 2023-12-19 DIAGNOSIS — I4821 Permanent atrial fibrillation: Secondary | ICD-10-CM

## 2023-12-19 DIAGNOSIS — Z95820 Peripheral vascular angioplasty status with implants and grafts: Secondary | ICD-10-CM

## 2023-12-19 DIAGNOSIS — I4510 Unspecified right bundle-branch block: Secondary | ICD-10-CM | POA: Diagnosis not present

## 2023-12-19 DIAGNOSIS — I2510 Atherosclerotic heart disease of native coronary artery without angina pectoris: Secondary | ICD-10-CM

## 2023-12-19 DIAGNOSIS — I5032 Chronic diastolic (congestive) heart failure: Secondary | ICD-10-CM | POA: Diagnosis not present

## 2023-12-19 NOTE — Patient Instructions (Signed)
 Medication Instructions:  Continue current medications until after your surgery.   *If you need a refill on your cardiac medications before your next appointment, please call your pharmacy*  Lab Work:  None today  If you have labs (blood work) drawn today and your tests are completely normal, you will receive your results only by: MyChart Message (if you have MyChart) OR A paper copy in the mail If you have any lab test that is abnormal or we need to change your treatment, we will call you to review the results.  Testing/Procedures: None  Follow-Up: At [facility], you and your health needs are our priority.  As part of our continuing mission to provide you with exceptional heart care, our providers are all part of one team.  This team includes your primary Cardiologist (physician) and Advanced Practice Providers or APPs (Physician Assistants and Nurse Practitioners) who all work together to provide you with the care you need, when you need it.  Your next appointment:   3 month(s)  Provider:   Redell Leiter, MD    We recommend signing up for the patient portal called MyChart.  Sign up information is provided on this After Visit Summary.  MyChart is used to connect with patients for Virtual Visits (Telemedicine).  Patients are able to view lab/test results, encounter notes, upcoming appointments, etc.  Non-urgent messages can be sent to your provider as well.   To learn more about what you can do with MyChart, go to [email or web address].   Other Instructions

## 2023-12-24 ENCOUNTER — Ambulatory Visit: Admitting: Pulmonary Disease

## 2023-12-24 ENCOUNTER — Encounter: Payer: Self-pay | Admitting: Pulmonary Disease

## 2023-12-24 VITALS — BP 118/70 | HR 106 | Temp 98.2°F | Ht 67.0 in | Wt 204.4 lb

## 2023-12-24 DIAGNOSIS — G4733 Obstructive sleep apnea (adult) (pediatric): Secondary | ICD-10-CM | POA: Diagnosis not present

## 2023-12-24 NOTE — Progress Notes (Signed)
 Synopsis: Referred in by Montey Lot, PA-C   Subjective:   PATIENT ID: Andrew Hardy. GENDER: male DOB: [birth year], MRN: [id number]  Chief Complaint  Patient presents with   Pre-op Exam    Pre-op Assessment for shoulder surgery, CT: upcoming on 9/16, OSA not on CPAP  Breathing has been stable but can't seem to feel any difference. Not currently on CPAP due to cost -- potentially would like to review his options for treatment. Curious about [facility].     HPI [name] is an 83 year old male patient with a past medical history of A.fib s/p watchman placement off ac, PAD CHF, hypertension and hyperlipidemia presenting today to the pulmonary clinic for further evaluation of shortness of breath.   He reports that he had shortness of breath on activity for many years however recently worsened. He denies chest pain or tightness. Denies any historu of asthma and no COVID exposure.   CXR in 2024 - No active cardiopulmonary disease   Echocardiogram 2024 - nl EF 55 to 60%, RV systolic function is reduced, MV is normal AV is normal.   FH - no family history of pulmonary disease   SH - Former smoker smoked 1 PPD for 20 years quit 30 years ago.   OV 09/22/2023 - [name] is here for a follow up visit regarding his dyspnea on exertion. He underwent a sleep study in April 2025 showing severe OSA w/ AHI 58 and nadi spo2 76%. He hasn't started on PAP therapy yet and was hesitant to. I explained to him that this associated with shortness of breath, fatigue and exhaustion as well as increased risk of CV complications. He is willing to try, number for advacair was provided. Unfortunately, he was not able to do his PFTs but continues to use his anoro. High res CT with ILA.   OV 12/24/2023 - [name] is here for preop evaluation for right shoulder arthroplasty. He has a history Severe OSA untreated and unable to tolerate CPAP. 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 He  has not done PFTs yet. 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 He has tolerated GA in the past most recent about 2 to 3 years ago.   ROS All systems were reviewed and were negative except for the above. Objective:   Vitals:   12/24/23 0834  BP: 118/70  Pulse: (!) 106  Temp: 98.2 F (36.8 C)  SpO2: 97%  Weight: 204 lb 6.4 oz (92.7 kg)  Height: 5' 7 (1.702 m)   97% on RA BMI Readings from Last 3 Encounters:  12/24/23 32.01 kg/m  12/19/23 31.92 kg/m  11/06/23 31.17 kg/m   Wt Readings from Last 3 Encounters:  12/24/23 204 lb 6.4 oz (92.7 kg)  12/19/23 203 lb 12.8 oz (92.4 kg)  11/06/23 199 lb (90.3 kg)    Physical Exam GEN: NAD, Healthy Appearing HEENT: Supple Neck, Reactive Pupils, EOMI  CVS: Normal S1, Normal S2, RRR, No murmurs or ES appreciated  Lungs: Bibasilar crackles noted.  Abdomen: Soft, non tender, non distended, + BS  Extremities: Warm and well perfused, No edema  Skin: No suspicious lesions appreciated  Psych: Normal Affect  Ancillary Information   CBC    Component Value Date/Time   WBC 7.1 03/31/2023 0946   WBC 6.0 11/14/2021 0938   RBC 4.25 03/31/2023 0946   RBC 4.61 11/14/2021 0938   HGB 12.2 (L) 03/31/2023 0946  HCT 38.9 03/31/2023 0946   PLT 272 03/31/2023 0946   MCV 92 03/31/2023 0946   MCH 28.7 03/31/2023 0946   MCH 31.7 11/14/2021 0938   MCHC 31.4 (L) 03/31/2023 0946   MCHC 33.2 11/14/2021 0938   RDW 13.9 03/31/2023 0946   LYMPHSABS 1.0 05/15/2020 1115   MONOABS 0.7 05/15/2020 1115   EOSABS 0.1 05/15/2020 1115   BASOSABS 0.1 05/15/2020 1115   Labs and imaging were reviewed.      No data to display           Assessment & Plan:  [name] is an 83 year old male patient with a past medical history of A.fib s/p watchman placement off ac, PAD CHF, hypertension and hyperlipidemia presenting today to the pulmonary clinic for further evaluation of shortness of breath.   #Dyspnea on exertion secondary to...  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 Jess.  #Suspecting COPD pending PFTs  []  PFTs  []  PAP order placed by Dr. Jess.  []  c/w Anoro Ellipta  1 puff daily  []  C/w Albuterol  as needed.   #Preop Evaluation  ARISCAT is 16 indicating a 1.6% risk of in-hospital post-op pulmonary complications including respiratory failure, respiratory infection, pleural effusion atelectasis or bronchospasm. This should not preclude him from having a potentially QoL improving surgery.   RTC 6 monhts.   I spent 20 minutes caring for this patient today, including preparing to see the patient, obtaining a medical history , reviewing a separately obtained history, performing a medically appropriate examination and/or evaluation, counseling and educating the patient/family/caregiver, ordering medications, tests, or procedures, documenting clinical information in the electronic health record, and independently interpreting results (not separately reported/billed) and communicating results to the patient/family/caregiver  Darrin Barn, MD [facility] 12/24/2023 8:52 AM

## 2023-12-26 ENCOUNTER — Other Ambulatory Visit: Payer: Self-pay | Admitting: Pulmonary Disease

## 2023-12-26 DIAGNOSIS — R0602 Shortness of breath: Secondary | ICD-10-CM

## 2024-01-03 ENCOUNTER — Other Ambulatory Visit: Payer: Self-pay | Admitting: Cardiology

## 2024-01-06 ENCOUNTER — Ambulatory Visit

## 2024-01-12 ENCOUNTER — Ambulatory Visit
Admission: RE | Admit: 2024-01-12 | Discharge: 2024-01-12 | Disposition: A | Discharge status: Home / Self Care | Source: Ambulatory Visit | Attending: Student in an Organized Health Care Education/Training Program | Admitting: Student in an Organized Health Care Education/Training Program

## 2024-01-12 DIAGNOSIS — H04123 Dry eye syndrome of bilateral lacrimal glands: Secondary | ICD-10-CM | POA: Diagnosis not present

## 2024-01-12 DIAGNOSIS — I700 Atherosclerosis of aorta: Secondary | ICD-10-CM | POA: Diagnosis not present

## 2024-01-12 DIAGNOSIS — R911 Solitary pulmonary nodule: Secondary | ICD-10-CM | POA: Insufficient documentation

## 2024-01-12 DIAGNOSIS — H52223 Regular astigmatism, bilateral: Secondary | ICD-10-CM | POA: Diagnosis not present

## 2024-01-12 DIAGNOSIS — R918 Other nonspecific abnormal finding of lung field: Secondary | ICD-10-CM | POA: Diagnosis not present

## 2024-01-12 DIAGNOSIS — H5203 Hypermetropia, bilateral: Secondary | ICD-10-CM | POA: Diagnosis not present

## 2024-01-26 ENCOUNTER — Other Ambulatory Visit: Payer: Self-pay | Admitting: Cardiology

## 2024-01-27 ENCOUNTER — Ambulatory Visit: Admitting: Pulmonary Disease

## 2024-02-16 ENCOUNTER — Other Ambulatory Visit: Payer: Self-pay

## 2024-02-17 DIAGNOSIS — Z0121 Encounter for dental examination and cleaning with abnormal findings: Secondary | ICD-10-CM | POA: Insufficient documentation

## 2024-02-17 MED ORDER — METOPROLOL SUCCINATE ER 25 MG PO TB24: 25.0000 mg | ORAL_TABLET | Freq: Two times a day (BID) | ORAL | 3 refills | Status: DC

## 2024-03-03 ENCOUNTER — Other Ambulatory Visit: Payer: Self-pay | Admitting: Pulmonary Disease

## 2024-03-03 DIAGNOSIS — R0602 Shortness of breath: Secondary | ICD-10-CM

## 2024-03-03 DIAGNOSIS — S025XXA Fracture of tooth (traumatic), initial encounter for closed fracture: Secondary | ICD-10-CM | POA: Insufficient documentation

## 2024-03-15 NOTE — Progress Notes (Unsigned)
 Cardiology Office Note:    Date:  03/16/2024   ID:  Nadara JONETTA Cornelious Mickey., DOB 05/26/1940, MRN [id number]  PCP:  Montey Lot, PA-C  Cardiologist:  Redell Leiter, MD    Referring MD: Montey Lot, PA-C    ASSESSMENT:    1. Paroxysmal atrial fibrillation (HCC)   2. Presence of Watchman left atrial appendage closure device   3. CAD, multiple vessel   4. Chronic heart failure with preserved ejection fraction (HCC)   5. Sinus bradycardia   6. On amiodarone  therapy   7. Acquired hypothyroidism    PLAN:    In order of problems listed above:  He is maintaining sinus rhythm with low-dose amiodarone  fortunately it is tolerated in the past my memory is he had bothersome tremor with that we will need to watch him closely no longer anticoagulated with Watchman device Stable CAD continue medical therapy including his clopidogrel  and lipid-lowering treatment with a high intensity statin and gemfibrozil  along with Zetia  Check labs today including a ApoB Stable no fluid overload continue his maintenance diuretic Discontinue his beta-blocker trend heart rate and blood pressure 1 week if rates remain less than 40-45 he will need a new ambulatory event monitor to be very infrequent and need a backup pacemaker however 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  Concerns regarding medicines are outlined above.  Orders Placed This Encounter  Procedures   EKG 12-Lead   No orders of the defined types were placed in this encounter.    History of Present Illness:    Kennan Detter. is a 83 y.o. male with a hx of paroxysmal atrial fibrillation with Watchman device in lieu of anticoagulation on amiodarone  therapy to maintain  sinus rhythm coronary artery disease with multiple PCI's heart failure preserved ejection fraction dyslipidemia and right bundle branch block last seen 12/19/2003 by Delon Hoover nurse practitioner.  Compliance with diet, lifestyle and medications: Yes  Is lost about 10% with body weight feels so much better actively forming not having cardiovascular symptoms of weakness edema palpitations syncope shortness of breath or chest pain Quite bradycardic he is on long-term amiodarone  will stop his metoprolol  he follows heart rate and blood pressure at home and send me a list in 1 week. 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 He tolerates his lipid-lowering therapy without muscle pain or weakness Is on thyroid  hormone and I cautioned to take it on empty stomach either 1 to 2 hours before eating in the morning or at bedtime to avoid fluctuations of blood level Past Medical History:  Diagnosis Date   Acute blood loss anemia    Angiodysplasia of colon with hemorrhage    Anxiety    Arthritis    Atrial fibrillation (HCC) 02/20/2023   Atypical atrial flutter (HCC) 12/14/2014   Barrett's esophagus with dysplasia    Benign neoplasm of ascending colon    Bradycardia, sinus 10/15/2014   Burning sensation of feet 01/17/2016   CAD, multiple vessel 10/18/2014   Overview:  DES to [facility] for unstable angina 10/17/14 Diagnostic Summary Severe stenosis of mid Cx likely cause of recent accelerating angina Diffuse  severe stenosis of small caliber LAD unchanged from 2012 Normal LV function Paroxysmal SVT noted during procedure 04/23/16:  PCI / Resolute Drug Eluting Stent of the proximal Left Anterior Descending Coronary Artery. Successful PTCA of the ostial 1st Diagonal Coronary Artery   Chronic left-sided low back pain with sciatica 02/21/2016   Chronic systolic CHF (congestive heart failure) (HCC)  04/19/2020   Closed fracture of tooth 03/03/2024   Coronary artery disease 2003   s/p MI and numerous stents   Coronary artery disease involving native coronary artery of native heart with angina pectoris 10/18/2014   Overview:  DES to [facility] for unstable angina 10/17/14 Diagnostic Summary Severe stenosis of mid Cx likely cause of recent accelerating angina Diffuse severe stenosis of small caliber LAD unchanged from 2012 Normal LV function Paroxysmal SVT noted during procedure 04/23/16:  PCI / Resolute Drug Eluting Stent of the proximal Left Anterior Descending Coronary Artery. 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   Degenerative spondylolisthesis 05/02/2017   Depression    Dyspnea    WITH EXERTION   Enlarged prostate without lower urinary tract symptoms (luts) 2012   frequent urination and nocturia; sees dr terra in [place]   Essential hypertension 10/15/2014   Full thickness rotator cuff tear 02/06/2021   Gastroesophageal reflux disease without esophagitis 04/21/2016   GERD (gastroesophageal reflux disease)    GI bleed 04/18/2020   H/O total shoulder replacement, left 11/23/2021   Hematochezia    Heme positive stool    Hyperlipidemia    Hypertension    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   IHD (ischemic heart disease) 10/15/2014   Impingement syndrome of left shoulder region 04/26/2021   Lower limb pain, inferior, right 12/17/2017   Lumbar radiculopathy 02/21/2016   Myocardial infarction [facility]) 2003   NSTEMI (non-ST elevated myocardial infarction) (HCC) 04/24/2016   Overview:  LHC 04/24/15: Severe stenosis of the LAD & Diagonal bifurcation  LV ejection fraction is 55-60 %  Interventional Summary Successful Angiosculpt PCI / Resolute Drug Eluting Stent of the proximal LAD Successful PTCA of the ostial 1st Diagonal Coronary Artery   Pain in both feet 01/17/2016   Pain in right knee 10/20/2017   Pain of  left hip joint 11/26/2017   Paroxysmal SVT (supraventricular tachycardia) 11/16/2014   Permanent atrial fibrillation (HCC) 02/20/2023   Precordial pain 10/15/2014   Presence of Watchman left atrial appendage closure device 02/20/2023   24mm Watchman FLX Pro with Dr. 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   Ascorbic Acid  (VITAMIN C) 1000 MG tablet Take 1,000 mg by mouth daily.   clopidogrel  (PLAVIX ) 75 MG tablet TAKE 1 TABLET(75 MG) BY MOUTH DAILY   Coenzyme Q10 (COQ10) 50 MG CAPS Take 50 mg by mouth daily.  cyanocobalamin  (VITAMIN B12) 1000 MCG tablet Take 1,000 mcg by mouth daily.   ezetimibe  (ZETIA ) 10 MG tablet TAKE 1 TABLET(10 MG) BY MOUTH DAILY   finasteride  (PROSCAR ) 5 MG tablet Take 5 mg by mouth daily.   Flaxseed, Linseed, (FLAXSEED OIL MAX STR) 1300 MG CAPS Take 1,300 mg by mouth daily.   furosemide  (LASIX ) 80 MG tablet Take 1 tablet (80 mg total) by mouth in the morning.   gemfibrozil  (LOPID ) 600 MG tablet TAKE 1/2 TABLET(300 MG) BY MOUTH TWICE DAILY BEFORE A MEAL   Glucosamine HCl (GLUCOSAMINE PO) Take 1 tablet by mouth 2 (two) times daily.   isosorbide  mononitrate (IMDUR ) 60 MG 24 hr tablet TAKE 1 TABLET(60 MG) BY MOUTH DAILY   levothyroxine (SYNTHROID) 25 MCG tablet Take 25  mcg by mouth daily.   metoprolol  succinate (TOPROL -XL) 25 MG 24 hr tablet Take 1 tablet (25 mg total) by mouth 2 (two) times daily.   Multiple Vitamins-Minerals (MULTIVITAMINS THER. W/MINERALS) TABS Take 1 tablet by mouth daily.   nitroGLYCERIN  (NITROSTAT ) 0.4 MG SL tablet ONE TABLET UNDER TONGUE AS NEEDED FOR CHEST PAIN EVERY 5 MINUTES   Omega-3 Fatty Acids (FISH OIL) 1000 MG CAPS Take 1,000 mg by mouth daily.   pantoprazole  (PROTONIX ) 40 MG tablet Take 1 tablet (40 mg total) by mouth daily.   potassium chloride  SA (KLOR-CON  M) 20 MEQ tablet TAKE 2 TABLETS BY MOUTH DAILY THEN TAKE 1 TABLET BY MOUTH AT NIGHT   rosuvastatin  (CRESTOR ) 10 MG tablet Take 10 mg by mouth daily.   tamsulosin  (FLOMAX ) 0.4 MG CAPS capsule Take 0.4 mg by mouth 2 (two) times daily.   traZODone  (DESYREL ) 100 MG tablet Take 100 mg by mouth at bedtime.   TURMERIC CURCUMIN PO Take 1 capsule by mouth daily.   umeclidinium-vilanterol (ANORO ELLIPTA ) 62.5-25 MCG/ACT AEPB INHALE 1 PUFF INTO THE LUNGS DAILY      EKGs/Labs/Other Studies Reviewed:    The following studies were reviewed today:  Cardiac Studies & Procedures   ______________________________________________________________________________________________ CARDIAC CATHETERIZATION  CARDIAC CATHETERIZATION 04/18/2023  Conclusion Nonobstructive CAD. Continued patency of multiple stents Mildly elevated LVEDP 21 mm Hg  Plan; continue medical therapy  Findings Coronary Findings Diagnostic  Dominance: Right  Left Anterior Descending Vessel is moderate in size. Non-stenotic Prox LAD to Mid LAD lesion was previously treated. Mid LAD lesion is 55% stenosed. The lesion is located at the bend. Dist LAD lesion is 50% stenosed. The lesion is located at the bend.  First Diagonal Branch Vessel is small in size. 1st Diag lesion is 60% stenosed. The lesion was previously treated using angioplasty . Rescue PTCA after stenting across  Second Diagonal Branch Vessel is  small in size.  Third Diagonal Branch Vessel is small in size.  Left Circumflex Vessel is small. Non-stenotic Prox Cx to Dist Cx lesion was previously treated. Non-stenotic Dist Cx lesion was previously treated. The lesion is distal to major branch, focal, concentric and irregular. Distal to the previous stent  First Obtuse Marginal Branch Vessel is small in size. Non-stenotic 1st Mrg lesion with 100% stenosed side branch in Lat 1st Mrg was previously treated. The lesion is eccentric and ulcerative. At very small sidebranch  Left Posterior Atrioventricular Artery Vessel is small in size.  Right Coronary Artery Prox RCA to Mid RCA lesion is 30% stenosed. The lesion was previously treated .  Intervention  No interventions have been documented.   CARDIAC CATHETERIZATION  CARDIAC CATHETERIZATION 08/10/2021  Conclusion   Prox RCA to Mid RCA  lesion is 30% stenosed.   Previously placed Prox LAD to Mid LAD stent (unknown type) is  widely patent.  Jailed 1st Diag lesion is 60% stenosed.   Previously placed Prox Cx to Dist Cx stent (unknown type) is  widely patent.   Lesion #1: Dist Cx lesion is 90% stenosed.   A drug-eluting stent was successfully placed using a STENT ONYX FRONTIER 2.25X12.  Deployed at 2.35 mm and postdilated to 2.45 mm in the overlap segment.   Post intervention, there is a 0% residual stenosis.   Lesion #2: 1st Mrg lesion is 80% stenosed with 95% stenosed side branch in Lat 1st Mrg.   Mid LAD lesion is 55% stenosed.   Dist LAD lesion is 50% stenosed.   A drug-eluting stent was successfully placed using a STENT ONYX FRONTIER 2.25X1 -> deployed to 2.45 mm.   Post intervention, there is a 0% residual stenosis.  Post intervention, the side branch was reduced to 100% residual stenosis.  (Patient did have chest pain with stent placement, resolved with sublingual nitroglycerin  and fentanyl /Versed .)  Multivessel CAD with 2 culprit lesions: Mid OM1 (85%) at small branch and  distal LCx-OM2 (90%) just distal to prior stent.  Mild ISR in RCA stent and ostial diagonal (jailed) 60%.  Tandem 50 to 60% lesions in the mid LAD that are in hinge points.  Not likely flow-limiting. Successful two-vessel PCI of 2 culprit lesions reducing to 0% stenosis each treated with Onyx frontier DES 2.25 mm 12 mm stents dilated to 2.45 mm. Preserved LVEF with low normal roughly 50 to 55%.  Inadequate filling-therefore unable to truly estimate EF and wall motion.  Findings Coronary Findings Diagnostic  Dominance: Right  Left Anterior Descending Vessel is moderate in size. Previously placed Prox LAD to Mid LAD stent (unknown type) is  widely patent. Mid LAD lesion is 55% stenosed. The lesion is located at the bend. Dist LAD lesion is 50% stenosed. The lesion is located at the bend.  First Diagonal Branch Vessel is small in size. 1st Diag lesion is 60% stenosed. The lesion was previously treated using angioplasty . Rescue PTCA after stenting across  Second Diagonal Branch Vessel is small in size.  Third Diagonal Branch Vessel is small in size.  Left Circumflex Vessel is small. Previously placed Prox Cx to Dist Cx stent (unknown type) is  widely patent. Dist Cx lesion is 90% stenosed. The lesion is distal to major branch, focal, concentric and irregular. Distal to the previous stent  First Obtuse Marginal Branch Vessel is small in size. 1st Mrg lesion is 80% stenosed with 95% stenosed side branch in Lat 1st Mrg. The lesion is eccentric and ulcerative. At very small sidebranch The lesion was not previously treated .  Left Posterior Atrioventricular Artery Vessel is small in size.  Right Coronary Artery Prox RCA to Mid RCA lesion is 30% stenosed. The lesion was previously treated .  Intervention  Prox Cx to Dist Cx lesion Stent (Also treats lesions: Dist Cx) Lesion length:  8 mm. CATH VISTA GUIDE 6FR XB3.5 guide catheter was inserted. Lesion crossed with guidewire using a  WIRE ASAHI PROWATER 180CM. Pre-stent angioplasty was performed using a BALLN SAPPHIRE 2.25X10. Maximum pressure:  10 atm. Inflation time: 20 sec. A drug-eluting stent was successfully placed using a STENT ONYX FRONTIER 2.25X12. Maximum pressure: 14 atm. Inflation time: 30 sec. Stent strut is well apposed. Stent balloon pulled back to the overlapping segment and inflated to high ATM Deployed to 2.35 mm, but that  the overlap site dilated to 2.45 mm. Post-stent angioplasty was not performed. Stent balloon at high ATM for overlap-18 ATM Downstream lesion covered with a overlapping stent, overlapping segment postdilated with stent balloon to high ATM. Post-Intervention Lesion Assessment The intervention was successful. Pre-interventional TIMI flow is 3. Post-intervention TIMI flow is 3. There is a 0% residual stenosis post intervention.  Dist Cx lesion Stent (Also treats lesions: Prox Cx to Dist Cx) See details in Prox Cx to Dist Cx lesion. Post-Intervention Lesion Assessment The intervention was successful. Pre-interventional TIMI flow is 3. Post-intervention TIMI flow is 3. Treated lesion length:  10 mm. No complications occurred at this lesion. There is a 0% residual stenosis post intervention.  1st Mrg lesion with side branch in Lat 1st Mrg Stent - Main Branch Lesion length:  10 mm. CATH VISTA GUIDE 6FR XB3.5 guide catheter was inserted. Lesion crossed with guidewire using a WIRE ASAHI PROWATER 180CM. Pre-stent angioplasty was performed using a BALLN SAPPHIRE 2.25X10. Maximum pressure:  10 atm. Inflation time:  20 sec. A drug-eluting stent was successfully placed using a STENT ONYX FRONTIER 2.25X12. Maximum pressure: 16 atm. Inflation time: 30 sec. Stent strut is well apposed. Deployed to 2.45 mm Post-stent angioplasty was not performed. Same Prowater wire was read directed after PCI of the distal LCx into the OM1 Post-Intervention Lesion Assessment The intervention was successful. Pre-interventional  TIMI flow is 3. Post-intervention TIMI flow is 3. Treated lesion length:  12 mm. Very small sidebranch occluded => 4 out of 10 chest pain, resolved with sublingual nitroglycerin  and second round of fentanyl  and Versed . There is a 0% residual stenosis in the main branch post intervention. There is a 100% residual stenosis in the side branch post intervention.   STRESS TESTS  MYOCARDIAL PERFUSION IMAGING 03/25/2019  Interpretation Summary  There was no ST segment deviation noted during stress.  No T wave inversion was noted during stress.  Nongated study due to atrial fibrillation.  The study is normal.  This is a low risk study.   ECHOCARDIOGRAM  ECHOCARDIOGRAM COMPLETE 01/29/2022  Narrative ECHOCARDIOGRAM REPORT    Patient Name:   Jeret Goyer. Date of Exam: 01/29/2022 Medical Rec #:  [id number]       Height:       66.0 in Accession #:    [id number]      Weight:       204.8 lb Date of Birth:  Dec 07, 1940       BSA:          2.020 m Patient Age:    85 years        BP:           94/60 mmHg Patient Gender: M               HR:           92 bpm. Exam Location:  [facility]  Procedure: 2D Echo, 3D Echo, Cardiac Doppler, Color Doppler and Strain Analysis  Indications:    Coronary artery disease involving native coronary artery of native heart with angina pectoris (HCC) [I25.119 (ICD-10-CM)]; Persistent atrial fibrillation (HCC) [I48.19 (ICD-10-CM)]; Chronic anticoagulation [Z79.01 (ICD-10-CM)]; High risk medication use [Z79.899 (ICD-10-CM)]; Hypertensive heart disease with heart failure (HCC) [I11.0 (ICD-10-CM)]; Mixed hyperlipidemia [E78.2 (ICD-10-CM)]; SOB (shortness of breath) on exertion [R06.02 (ICD-10-CM)]  History:        Patient has prior history of Echocardiogram examinations, most recent 05/04/2019. CAD and Previous Myocardial Infarction, Arrythmias:Atrial Fibrillation, SVT and PVC; Risk Factors:Dyslipidemia and  Hypertension.  Sonographer:    Charlie Jointer  RDCS Referring Phys: [id number] Ellenora Talton J Suzane Vanderweide  IMPRESSIONS   1. Left ventricular ejection fraction, by estimation, is 50 to 55%. Left ventricular ejection fraction by 3D volume is 53 %. The left ventricle has low normal function. The left ventricle has no regional wall motion abnormalities. Left ventricular diastolic parameters are indeterminate. The average left ventricular global longitudinal strain is -12.8 %. The global longitudinal strain is abnormal. 2. Right ventricular systolic function is normal. The right ventricular size is normal. There is normal pulmonary artery systolic pressure. 3. Left atrial size was mildly dilated. 4. The mitral valve is degenerative. Mild mitral valve regurgitation. No evidence of mitral stenosis. 5. The aortic valve is tricuspid. Aortic valve regurgitation is mild. Aortic valve sclerosis is present, with no evidence of aortic valve stenosis. 6. Aortic normal DTA. 7. The inferior vena cava is dilated in size with >50% respiratory variability, suggesting right atrial pressure of 8 mmHg.  FINDINGS Left Ventricle: Left ventricular ejection fraction, by estimation, is 50 to 55%. Left ventricular ejection fraction by 3D volume is 53 %. The left ventricle has low normal function. The left ventricle has no regional wall motion abnormalities. The average left ventricular global longitudinal strain is -12.8 %. The global longitudinal strain is abnormal. The left ventricular internal cavity size was normal in size. There is no left ventricular hypertrophy. Left ventricular diastolic parameters are indeterminate.  Right Ventricle: The right ventricular size is normal. No increase in right ventricular wall thickness. Right ventricular systolic function is normal. There is normal pulmonary artery systolic pressure. The tricuspid regurgitant velocity is 2.47 m/s, and with an assumed right atrial pressure of 8 mmHg, the estimated right ventricular systolic pressure is 32.4  mmHg.  Left Atrium: Left atrial size was mildly dilated.  Right Atrium: Right atrial size was normal in size.  Pericardium: There is no evidence of pericardial effusion.  Mitral Valve: The mitral valve is degenerative in appearance. Mild mitral annular calcification. Mild mitral valve regurgitation. No evidence of mitral valve stenosis.  Tricuspid Valve: The tricuspid valve is normal in structure. Tricuspid valve regurgitation is mild . No evidence of tricuspid stenosis.  Aortic Valve: The aortic valve is tricuspid. Aortic valve regurgitation is mild. Aortic regurgitation PHT measures 724 msec. Aortic valve sclerosis is present, with no evidence of aortic valve stenosis.  Pulmonic Valve: The pulmonic valve was normal in structure. Pulmonic valve regurgitation is not visualized. No evidence of pulmonic stenosis.  Aorta: The aortic root and ascending aorta are structurally normal, with no evidence of dilitation, normal DTA and the aortic arch was not well visualized.  Venous: A systolic blunting flow pattern is recorded from the right upper pulmonary vein. The inferior vena cava is dilated in size with greater than 50% respiratory variability, suggesting right atrial pressure of 8 mmHg.  IAS/Shunts: No atrial level shunt detected by color flow Doppler.   LEFT VENTRICLE PLAX 2D LVIDd:         4.20 cm         Diastology LVIDs:         3.00 cm         LV e' medial:    8.38 cm/s LV PW:         1.10 cm         LV E/e' medial:  11.9 LV IVS:        1.20 cm         LV e' lateral:  12.60 cm/s LVOT diam:     2.00 cm         LV E/e' lateral: 7.9 LV SV:         48 LV SV Index:   24              2D LVOT Area:     3.14 cm        Longitudinal Strain 2D Strain GLS  -12.8 % LV Volumes (MOD)               Avg: LV vol d, MOD    74.4 ml A2C:                           3D Volume EF LV vol d, MOD    109.0 ml      LV 3D EF:    Left A4C:                                        ventricul LV vol s, MOD     33.3 ml                    ar A2C:                                        ejection LV vol s, MOD    50.0 ml                    fraction A4C:                                        by 3D LV SV MOD A2C:   41.1 ml                    volume is LV SV MOD A4C:   109.0 ml                   53 %. LV SV MOD BP:    52.7 ml  3D Volume EF: 3D EF:        53 % LV EDV:       118 ml LV ESV:       56 ml LV SV:        62 ml  RIGHT VENTRICLE             IVC RV Basal diam:  3.00 cm     IVC diam: 2.50 cm RV S prime:     13.80 cm/s TAPSE (M-mode): 1.6 cm  LEFT ATRIUM             Index        RIGHT ATRIUM           Index LA diam:        4.10 cm 2.03 cm/m   RA Area:     17.30 cm LA Vol (A2C):   75.8 ml 37.52 ml/m  RA Volume:   37.00 ml  18.32 ml/m LA Vol (A4C):   63.7 ml 31.53 ml/m LA Biplane Vol: 70.6 ml 34.95 ml/m AORTIC VALVE LVOT Vmax:   87.70 cm/s LVOT Vmean:  62.500 cm/s LVOT  VTI:    0.152 m AI PHT:      724 msec  AORTA Ao Root diam: 3.10 cm Ao Asc diam:  3.40 cm Ao Desc diam: 2.30 cm  MITRAL VALVE               TRICUSPID VALVE MV Area (PHT): 3.53 cm    TR Peak grad:   24.4 mmHg MV Decel Time: 215 msec    TR Vmax:        247.00 cm/s MR Peak grad: 50.7 mmHg MR Vmax:      356.00 cm/s  SHUNTS MV E velocity: 99.80 cm/s  Systemic VTI:  0.15 m Systemic Diam: 2.00 cm  Redell Leiter MD Electronically signed by Redell Leiter MD Signature Date/Time: 01/29/2022/12:40:53 PM    Final   TEE  ECHO TEE 04/18/2023  Narrative TRANSESOPHOGEAL ECHO REPORT    Patient Name:   Colton Tassin. Date of Exam: 04/18/2023 Medical Rec #:  [id number]       Height:       67.0 in Accession #:    [id number]      Weight:       214.0 lb Date of Birth:  10/23/1940       BSA:          2.081 m Patient Age:    82 years        BP:           113/73 mmHg Patient Gender: M               HR:           91 bpm. Exam Location:  Outpatient  Procedure: 3D Echo, Transesophageal Echo, Cardiac Doppler and Color  Doppler  Indications:     Watchman evaluation  History:         Patient has prior history of Echocardiogram examinations, most recent 02/20/2023.  Sonographer:     Tinnie Rgers RDCS Referring Phys:  [id number] OLE ONEIDA HOLTS Diagnosing Phys: Stanly Leavens MD  PROCEDURE: After discussion of the risks and benefits of a TEE, an informed consent was obtained from the patient. The transesophogeal probe was passed without difficulty through the esophogus of the patient. Sedation performed by different physician. The patient developed no complications during the procedure.  IMPRESSIONS   1. Left ventricular ejection fraction, by estimation, is 55 to 60%. The left ventricle has normal function. 2. Right ventricular systolic function is moderately reduced. The right ventricular size is normal. 3. There isa 24 mm Watchman FLX present. No device leak. No DRT. Average compression 14%. Well seated device. Left atrial size was severely dilated. No left atrial/left atrial appendage thrombus was detected. 4. The mitral valve is normal in structure. Mild mitral valve regurgitation. No evidence of mitral stenosis. 5. The aortic valve is tricuspid. Aortic valve regurgitation is mild. Aortic valve sclerosis is present, with no evidence of aortic valve stenosis. 6. There is mild (Grade II) plaque involving the descending aorta. 7. Evidence of atrial level shunting detected by color flow Doppler. Minimal iatrogenic ASD is left.  FINDINGS Left Ventricle: Left ventricular ejection fraction, by estimation, is 55 to 60%. The left ventricle has normal function. The left ventricular internal cavity size was normal in size.  Right Ventricle: The right ventricular size is normal. No increase in right ventricular wall thickness. Right ventricular systolic function is moderately reduced.  Left Atrium: There isa 24 mm Watchman FLX present. No device leak. No DRT. Average compression 14%. Well  seated device. Left  atrial size was severely dilated. No left atrial/left atrial appendage thrombus was detected.  Right Atrium: Right atrial size was normal in size.  Pericardium: There is no evidence of pericardial effusion.  Mitral Valve: The mitral valve is normal in structure. Mild mitral valve regurgitation. No evidence of mitral valve stenosis.  Tricuspid Valve: The tricuspid valve is normal in structure. Tricuspid valve regurgitation is mild . No evidence of tricuspid stenosis.  Aortic Valve: The aortic valve is tricuspid. Aortic valve regurgitation is mild. Aortic valve sclerosis is present, with no evidence of aortic valve stenosis.  Pulmonic Valve: The pulmonic valve was normal in structure. Pulmonic valve regurgitation is trivial. No evidence of pulmonic stenosis.  Aorta: The aortic root and ascending aorta are structurally normal, with no evidence of dilitation. There is mild (Grade II) plaque involving the descending aorta.  IAS/Shunts: The interatrial septum appears to be lipomatous. Evidence of atrial level shunting detected by color flow Doppler.  Stanly Leavens MD Electronically signed by Stanly Leavens MD Signature Date/Time: 04/18/2023/4:24:46 PM    Final (Updated)  MONITORS  LONG TERM MONITOR (3-14 DAYS) 01/29/2022  Narrative Patch Wear Time:  7 days and 10 hours (2023-09-25T08:59:54-0400 to 2023-10-02T19:21:24-0400)  Atrial Fibrillation occurred continuously (100% burden), ranging from 57-177 bpm (avg of 96 bpm). Bundle Branch Block/IVCD was present.  There were no pauses of 3 seconds or greater  Isolated VEs were rare (<1.0%), VE Couplets were rare (<1.0%), and no VE Triplets were present. Ventricular Bigeminy was present.  Heart rate was greater than 110 bpm 26% of the time daytime 2% of the time nighttime.  Her 48 triggered events all associated with atrial fibrillation generally rapid rates greater than 110 bpm and 1 episode with ventricular bigeminy.        ______________________________________________________________________________________________      EKG Interpretation Date/Time:  Tuesday March 16 2024 08:00:19 EST Ventricular Rate:  40 PR Interval:  178 QRS Duration:  158 QT Interval:  568 QTC Calculation: 462 R Axis:   140  Text Interpretation: Marked sinus bradycardia Right bundle branch block When compared with ECG of 06-May-2023 11:17, Sinus rhythm has replaced Atrial fibrillation Vent. rate has decreased BY  57 BPM QRS axis Shifted right QT has shortened Confirmed by Monetta Rogue ([id number]) on 03/16/2024 8:02:36 AM   Recent Labs: 03/31/2023: BUN 18; Creatinine, Ser 1.20; Hemoglobin 12.2; Platelets 272; Potassium 4.4; Sodium 141  Recent Lipid Panel    Component Value Date/Time   CHOL 136 08/13/2022 0900   TRIG 146 08/13/2022 0900   HDL 54 08/13/2022 0900   CHOLHDL 2.5 08/13/2022 0900   LDLCALC 57 08/13/2022 0900   EKG Interpretation Date/Time:  Tuesday March 16 2024 08:00:19 EST Ventricular Rate:  40 PR Interval:  178 QRS Duration:  158 QT Interval:  568 QTC Calculation: 462 R Axis:   140  Text Interpretation: Marked sinus bradycardia Right bundle branch block When compared with ECG of 06-May-2023 11:17, Sinus rhythm has replaced Atrial fibrillation Vent. rate has decreased BY  57 BPM QRS axis Shifted right QT has shortened Confirmed by Monetta Rogue ([id number]) on 03/16/2024 8:02:36 AM   Physical Exam:    VS:  BP 138/82   Pulse (!) 40   Ht 5' 7 (1.702 m)   Wt 204 lb (92.5 kg)   SpO2 97%   BMI 31.95 kg/m     Wt Readings from Last 3 Encounters:  03/16/24 204 lb (92.5 kg)  12/24/23 204 lb 6.4 oz (92.7 kg)  12/19/23 203 lb 12.8 oz (92.4 kg)     GEN: Marked improvement in his appearance well nourished, well developed in no acute distress HEENT: Normal NECK: No JVD; No carotid bruits LYMPHATICS: No lymphadenopathy CARDIAC: RRR, no murmurs, rubs, gallops RESPIRATORY:  Clear to auscultation without rales,  wheezing or rhonchi  ABDOMEN: Soft, non-tender, non-distended MUSCULOSKELETAL:  No edema; No deformity  SKIN: Warm and dry NEUROLOGIC:  Alert and oriented x 3 PSYCHIATRIC:  Normal affect    Signed, Redell Leiter, MD  03/16/2024 8:19 AM    [facility]

## 2024-03-16 ENCOUNTER — Ambulatory Visit: Attending: Cardiology | Admitting: Cardiology

## 2024-03-16 ENCOUNTER — Encounter: Payer: Self-pay | Admitting: Cardiology

## 2024-03-16 VITALS — BP 138/82 | HR 40 | Ht 67.0 in | Wt 204.0 lb

## 2024-03-16 DIAGNOSIS — I5032 Chronic diastolic (congestive) heart failure: Secondary | ICD-10-CM

## 2024-03-16 DIAGNOSIS — Z79899 Other long term (current) drug therapy: Secondary | ICD-10-CM

## 2024-03-16 DIAGNOSIS — R001 Bradycardia, unspecified: Secondary | ICD-10-CM | POA: Diagnosis not present

## 2024-03-16 DIAGNOSIS — I2510 Atherosclerotic heart disease of native coronary artery without angina pectoris: Secondary | ICD-10-CM

## 2024-03-16 DIAGNOSIS — E039 Hypothyroidism, unspecified: Secondary | ICD-10-CM | POA: Diagnosis not present

## 2024-03-16 DIAGNOSIS — I480 Paroxysmal atrial fibrillation: Secondary | ICD-10-CM | POA: Diagnosis not present

## 2024-03-16 DIAGNOSIS — Z95818 Presence of other cardiac implants and grafts: Secondary | ICD-10-CM | POA: Diagnosis not present

## 2024-03-16 NOTE — Addendum Note (Signed)
 Addended by: SHERRE ADE I on: 03/16/2024 08:34 AM   Modules accepted: Orders

## 2024-03-16 NOTE — H&P (Signed)
 Patient's anticipated LOS is less than 2 midnights, meeting these requirements: - Younger than 3 - Lives within 1 hour of care - Has a competent adult at home to recover with post-op recover - NO history of  - Chronic pain requiring opiods  - Diabetes  - Coronary Artery Disease  - Heart failure  - Heart attack  - Stroke  - DVT/VTE  - Cardiac arrhythmia  - Respiratory Failure/COPD  - Renal failure  - Anemia  - Advanced Liver disease     Andrew Hardy. is an 83 y.o. male.    Chief Complaint: right shoulder pain and weakness  HPI: Pt is a 83 y.o. male complaining of right shoulder pain for multiple years. Pain had continually increased since the beginning. X-rays in the clinic show end-stage arthritic changes of the right shoulder. Pt has tried various conservative treatments which have failed to alleviate their symptoms, including injections and therapy. Various options are discussed with the patient. Risks, benefits and expectations were discussed with the patient. Patient understand the risks, benefits and expectations and wishes to proceed with surgery.   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 Successful PTCA  of the ostial 1st Diagonal Coronary Artery   Chronic left-sided low back pain with sciatica 02/21/2016   Chronic systolic CHF (congestive heart failure) (HCC) 04/19/2020   Closed fracture of tooth 03/03/2024   Coronary artery disease 2003   s/p MI and numerous stents   Coronary artery disease involving native coronary artery of native heart with angina pectoris 10/18/2014   Overview:  DES to [facility] for unstable angina 10/17/14 Diagnostic Summary Severe stenosis of mid Cx likely cause of recent accelerating angina Diffuse severe stenosis of small caliber LAD unchanged from 2012 Normal LV function Paroxysmal SVT noted during procedure 04/23/16:  PCI / Resolute Drug Eluting Stent of the proximal Left Anterior Descending Coronary Artery. 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   Hematochezia    Heme positive stool    Hyperlipidemia    Hypertension    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   IHD (ischemic heart disease) 10/15/2014   Impingement syndrome of left shoulder region 04/26/2021  Lower limb pain, inferior, right 12/17/2017   Lumbar radiculopathy 02/21/2016   Myocardial infarction [facility]) 2003   NSTEMI (non-ST elevated myocardial infarction) (HCC) 04/24/2016   Overview:  LHC 04/24/15: Severe stenosis of the LAD & Diagonal bifurcation  LV ejection fraction is 55-60 %  Interventional Summary Successful Angiosculpt PCI / Resolute Drug Eluting  Stent of the proximal LAD Successful PTCA of the ostial 1st Diagonal Coronary Artery   Pain in both feet 01/17/2016   Pain in right knee 10/20/2017   Pain of left hip joint 11/26/2017   Paroxysmal SVT (supraventricular tachycardia) 11/16/2014   Permanent atrial fibrillation (HCC) 02/20/2023   Precordial pain 10/15/2014   Presence of Watchman left atrial appendage closure device 02/20/2023   24mm Watchman FLX Pro with Dr. 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 ENDOSCOPY;  Service: Gastroenterology;  Laterality: N/A;   CORONARY ANGIOPLASTY     CORONARY STENT INTERVENTION N/A 08/10/2021   Procedure: CORONARY STENT INTERVENTION;  Surgeon: Anner Alm ORN, MD;  Location: [facility] INVASIVE CV LAB;  Service: Cardiovascular;  Laterality: N/A;   ESOPHAGOGASTRODUODENOSCOPY (EGD) WITH PROPOFOL  N/A 04/24/2020   Procedure: ESOPHAGOGASTRODUODENOSCOPY (EGD) WITH PROPOFOL ;  Surgeon: Albertus Gordy HERO, MD;  Location: [facility] ENDOSCOPY;  Service:  Gastroenterology;  Laterality: N/A;   EYE SURGERY  05-1010   bil cataract ,iol   FRACTURE SURGERY     HEMOSTASIS CONTROL  04/24/2020   Procedure: HEMOSTASIS CONTROL;  Surgeon: Albertus Gordy HERO, MD;  Location: [facility] ENDOSCOPY;  Service: Gastroenterology;;   I & D EXTREMITY Right 02/23/2015   Procedure: RIGHT RING FINGER REVISION AMPUTATION;  Surgeon: Franky Curia, MD;  Location: [facility] OR;  Service: Orthopedics;  Laterality: Right;   KNEE ARTHROSCOPY W/ MENISCAL REPAIR  2000   left knee   LEFT ATRIAL APPENDAGE OCCLUSION N/A 02/20/2023   Procedure: LEFT ATRIAL APPENDAGE OCCLUSION;  Surgeon: Cindie Ole DASEN, MD;  Location: [facility] INVASIVE CV LAB;  Service: Cardiovascular;  Laterality: N/A;   LEFT HEART CATH AND CORONARY ANGIOGRAPHY N/A 08/10/2021   Procedure: LEFT HEART CATH AND CORONARY ANGIOGRAPHY;  Surgeon: Anner Alm ORN, MD;  Location: [facility] INVASIVE CV LAB;  Service: Cardiovascular;  Laterality: N/A;   LEFT HEART CATH AND CORONARY ANGIOGRAPHY N/A 04/18/2023   Procedure: LEFT HEART CATH AND CORONARY ANGIOGRAPHY;  Surgeon: Jordan, Peter M, MD;  Location: [facility] INVASIVE CV LAB;  Service: Cardiovascular;  Laterality: N/A;   LUMBAR EPIDURAL INJECTION  2011   low back pain; DDD   POLYPECTOMY  04/24/2020   Procedure: POLYPECTOMY;  Surgeon: Albertus Gordy HERO, MD;  Location: [facility] ENDOSCOPY;  Service: Gastroenterology;;   REVERSE SHOULDER ARTHROPLASTY Left 11/23/2021   Procedure: REVERSE SHOULDER ARTHROPLASTY;  Surgeon: Kay Kemps, MD;  Location: [facility] ORS;  Service: Orthopedics;  Laterality: Left;   ring finger tramatic qamputation Right    SHOULDER ARTHROSCOPY  03/29/2011   Procedure: ARTHROSCOPY SHOULDER;  Surgeon: Elspeth JONELLE Kay;  Location: [facility] OR;  Service: Orthopedics;  Laterality: Right;  RIGHT SHOULDER ARTHROSCOPY WITH OPEN SUBSCAPULAR REPAIR   SHOULDER OPEN ROTATOR CUFF REPAIR     3 on rt shoulder; 2 on left   TRANSESOPHAGEAL ECHOCARDIOGRAM (CATH LAB) N/A 02/20/2023   Procedure: TRANSESOPHAGEAL ECHOCARDIOGRAM;   Surgeon: Cindie Ole DASEN, MD;  Location: [facility] INVASIVE CV LAB;  Service: Cardiovascular;  Laterality: N/A;   TRANSESOPHAGEAL ECHOCARDIOGRAM (CATH LAB) N/A 04/18/2023   Procedure: TRANSESOPHAGEAL ECHOCARDIOGRAM;  Surgeon: Santo Stanly LABOR, MD;  Location: [facility] INVASIVE CV LAB;  Service: Cardiovascular;  Laterality: N/A;   TRANSFORAMINAL LUMBAR INTERBODY FUSION (TLIF) WITH PEDICLE SCREW FIXATION 1 LEVEL N/A 04/24/2017   Procedure: TRANSFORAMINAL LUMBAR INTERBODY FUSION (TLIF) L4-5;  Surgeon: Burnetta Aures, MD;  Location: [facility] OR;  Service: Orthopedics;  Laterality: N/A;  4 hrs    Social History:  reports that he quit smoking about 30 years ago. His smoking use included cigarettes. He started smoking about 50 years ago. He has a 20 pack-year smoking history. He quit smokeless tobacco use about 28 years ago.  His smokeless tobacco use included chew. He reports current alcohol  use of about 6.0 standard drinks of alcohol  per week. He reports that he does not use drugs. BMI: Estimated body mass index is 31.95 kg/m as calculated from the following:   Height as of 03/16/24: 5' 7 (1.702 m).   Weight as of 03/16/24: 92.5 kg.  Lab Results  Component Value Date   ALBUMIN  4.2 08/13/2022   Diabetes: Patient does not have a diagnosis of diabetes.     Smoking Status:      Allergies:  Allergies  Allergen Reactions   Amiodarone      Eye toxicity    Buprenorphine Hcl Itching   Codeine Itching    Can take benadryl  with med    Hydrocodone  Itching    Can take benadryl  with med    Percocet [Oxycodone -Acetaminophen ] Itching    Pt.states he can take but must take with benadryl    Sotalol  Hcl     Feet burning    Medications: No current facility-administered medications for this encounter.   Current Outpatient Medications  Medication Sig Dispense Refill   amiodarone  (PACERONE ) 200 MG tablet Take 1 tablet (200 mg total) by mouth daily. 90 tablet 0   Ascorbic Acid  (VITAMIN C) 1000 MG tablet Take  1,000 mg by mouth daily.     clopidogrel  (PLAVIX ) 75 MG tablet TAKE 1 TABLET(75 MG) BY MOUTH DAILY 90 tablet 1   Coenzyme Q10 (COQ10) 50 MG CAPS Take 50 mg by mouth daily.     cyanocobalamin  (VITAMIN B12) 1000 MCG tablet Take 1,000 mcg by mouth daily.     ezetimibe  (ZETIA ) 10 MG tablet TAKE 1 TABLET(10 MG) BY MOUTH DAILY 90 tablet 3   finasteride  (PROSCAR ) 5 MG tablet Take 5 mg by mouth daily.     Flaxseed, Linseed, (FLAXSEED OIL MAX STR) 1300 MG CAPS Take 1,300 mg by mouth daily.     furosemide  (LASIX ) 80 MG tablet Take 1 tablet (80 mg total) by mouth in the morning. 90 tablet 3   gemfibrozil  (LOPID ) 600 MG tablet TAKE 1/2 TABLET(300 MG)  BY MOUTH TWICE DAILY BEFORE A MEAL 90 tablet 1   Glucosamine HCl (GLUCOSAMINE PO) Take 1 tablet by mouth 2 (two) times daily.     isosorbide  mononitrate (IMDUR ) 60 MG 24 hr tablet TAKE 1 TABLET(60 MG) BY MOUTH DAILY 90 tablet 3   levothyroxine (SYNTHROID) 25 MCG tablet Take 25 mcg by mouth daily.     Multiple Vitamins-Minerals (MULTIVITAMINS THER. W/MINERALS) TABS Take 1 tablet by mouth daily.     nitroGLYCERIN  (NITROSTAT ) 0.4 MG SL tablet ONE TABLET UNDER TONGUE AS NEEDED FOR CHEST PAIN EVERY 5 MINUTES 25 tablet 7   Omega-3 Fatty Acids (FISH OIL) 1000 MG CAPS Take 1,000 mg by mouth daily.     pantoprazole  (PROTONIX ) 40 MG tablet Take 1 tablet (40 mg total) by mouth daily. 30 tablet 6   potassium chloride  SA (KLOR-CON  M) 20 MEQ tablet TAKE 2 TABLETS BY MOUTH DAILY THEN TAKE 1 TABLET BY MOUTH AT NIGHT 270 tablet 2   rosuvastatin  (CRESTOR ) 10 MG tablet Take 10 mg by mouth daily.     tamsulosin  (FLOMAX ) 0.4 MG CAPS capsule Take 0.4 mg by mouth 2 (two) times daily.     traZODone  (DESYREL ) 100 MG tablet Take 100 mg by mouth at bedtime.     TURMERIC CURCUMIN PO Take 1 capsule by mouth daily.     umeclidinium-vilanterol (ANORO ELLIPTA ) 62.5-25 MCG/ACT AEPB INHALE 1 PUFF INTO THE LUNGS DAILY 60 each 11    No results found for this or any previous visit (from the  past 48 hours). No results found.  ROS: Pain with rom of the right upper extremity  Physical Exam: Alert and oriented 83 y.o. male in no acute distress Cranial nerves 2-12 intact Cervical spine: full rom with no tenderness, nv intact distally Chest: active breath sounds bilaterally, no wheeze rhonchi or rales Heart: regular rate and rhythm, no murmur Abd: non tender non distended with active bowel sounds Hip is stable with rom  Right shoulder painful and weak rom Nv intact distally No rashes or edema distally  Assessment/Plan Assessment: right shoulder cuff arthropathy  Plan:  Patient will undergo a right reverse total shoulder by Dr. Kay at [place] Risks benefits and expectations were discussed with the patient. Patient understand risks, benefits and expectations and wishes to proceed. Preoperative templating of the joint replacement has been completed, documented, and submitted to the Operating Room personnel in order to optimize intra-operative equipment management.   Arvella Fireman PA-C, MPAS [facility] is now [employer] [address], [address], [place], [place] [address] Phone: [phone] [email or web address] Facebook  [employer]

## 2024-03-16 NOTE — Patient Instructions (Signed)
 Medication Instructions:  Your physician has recommended you make the following change in your medication:   STOP: Metoprolol  succinate  *If you need a refill on your cardiac medications before your next appointment, please call your pharmacy*  Lab Work: Your physician recommends that you return for lab work in:   Labs today: CMP, Pro BNP, TSH T3 T4, Lipids, Apo B  If you have labs (blood work) drawn today and your tests are completely normal, you will receive your results only by: MyChart Message (if you have MyChart) OR A paper copy in the mail If you have any lab test that is abnormal or we need to change your treatment, we will call you to review the results.  Testing/Procedures: None  Follow-Up: At [facility], you and your health needs are our priority.  As part of our continuing mission to provide you with exceptional heart care, our providers are all part of one team.  This team includes your primary Cardiologist (physician) and Advanced Practice Providers or APPs (Physician Assistants and Nurse Practitioners) who all work together to provide you with the care you need, when you need it.  Your next appointment:   6 month(s)  Provider:   Redell Leiter, MD    We recommend signing up for the patient portal called MyChart.  Sign up information is provided on this After Visit Summary.  MyChart is used to connect with patients for Virtual Visits (Telemedicine).  Patients are able to view lab/test results, encounter notes, upcoming appointments, etc.  Non-urgent messages can be sent to your provider as well.   To learn more about what you can do with MyChart, go to [email or web address].   Other Instructions Take your Synthroid 1 - 2 hours before breakfast or at bedtime or on empty stomach.  Please keep a BP log for 1 week and send by MyChart or mail.                      Dr. Leiter [address] [place], [place] [address]  Blood Pressure Record Sheet To take your  blood pressure, you will need a blood pressure machine. You can buy a blood pressure machine (blood pressure monitor) at your clinic, drug store, or online. When choosing one, consider: An automatic monitor that has an arm cuff. A cuff that wraps snugly around your upper arm. You should be able to fit only one finger between your arm and the cuff. A device that stores blood pressure reading results. Do not choose a monitor that measures your blood pressure from your wrist or finger. Follow your health care provider's instructions for how to take your blood pressure. To use this form: Get one reading in the morning (a.m.) 1-2 hours after you take any medicines. Get one reading in the evening (p.m.) before supper.   Blood pressure log Date: _______________________  a.m. _____________________(1st reading) HR___________            p.m. _____________________(2nd reading) HR__________  Date: _______________________  a.m. _____________________(1st reading) HR___________            p.m. _____________________(2nd reading) HR__________  Date: _______________________  a.m. _____________________(1st reading) HR___________            p.m. _____________________(2nd reading) HR__________  Date: _______________________  a.m. _____________________(1st reading) HR___________            p.m. _____________________(2nd reading) HR__________  Date: _______________________  a.m. _____________________(1st reading) HR___________  p.m. _____________________(2nd reading) HR__________  Date: _______________________  a.m. _____________________(1st reading) HR___________            p.m. _____________________(2nd reading) HR__________  Date: _______________________  a.m. _____________________(1st reading) HR___________            p.m. _____________________(2nd reading) HR__________   This information is not intended to replace advice given to you by your health care provider. Make  sure you discuss any questions you have with your health care provider. Document Revised: 07/28/2019 Document Reviewed: 07/28/2019 Elsevier Patient Education  2021 [employer].

## 2024-03-17 ENCOUNTER — Ambulatory Visit: Payer: Self-pay | Admitting: Cardiology

## 2024-03-17 LAB — TSH+T4F+T3FREE
Free T4: 1.1 ng/dL (ref 0.82–1.77)
T3, Free: 2.2 pg/mL (ref 2.0–4.4)
TSH: 7.62 u[IU]/mL — ABNORMAL HIGH (ref 0.450–4.500)

## 2024-03-17 LAB — COMPREHENSIVE METABOLIC PANEL WITH GFR
ALT: 13 IU/L (ref 0–44)
AST: 20 IU/L (ref 0–40)
Albumin: 4.4 g/dL (ref 3.7–4.7)
Alkaline Phosphatase: 65 IU/L (ref 48–129)
BUN/Creatinine Ratio: 11 (ref 10–24)
BUN: 14 mg/dL (ref 8–27)
Bilirubin Total: 0.4 mg/dL (ref 0.0–1.2)
CO2: 23 mmol/L (ref 20–29)
Calcium: 9 mg/dL (ref 8.6–10.2)
Chloride: 103 mmol/L (ref 96–106)
Creatinine, Ser: 1.27 mg/dL (ref 0.76–1.27)
Globulin, Total: 2.2 g/dL (ref 1.5–4.5)
Glucose: 106 mg/dL — ABNORMAL HIGH (ref 70–99)
Potassium: 4.1 mmol/L (ref 3.5–5.2)
Sodium: 142 mmol/L (ref 134–144)
Total Protein: 6.6 g/dL (ref 6.0–8.5)
eGFR: 57 mL/min/1.73 — ABNORMAL LOW (ref >59)

## 2024-03-17 LAB — PRO B NATRIURETIC PEPTIDE: NT-Pro BNP: 301 pg/mL (ref 0–486)

## 2024-03-17 LAB — LIPID PANEL
Chol/HDL Ratio: 2.5 ratio (ref 0.0–5.0)
Cholesterol, Total: 126 mg/dL (ref 100–199)
HDL: 51 mg/dL (ref >39)
LDL Chol Calc (NIH): 58 mg/dL (ref 0–99)
Triglycerides: 92 mg/dL (ref 0–149)
VLDL Cholesterol Cal: 17 mg/dL (ref 5–40)

## 2024-03-17 LAB — APOLIPOPROTEIN B: Apolipoprotein B: 65 mg/dL (ref <90)

## 2024-03-26 DIAGNOSIS — E038 Other specified hypothyroidism: Secondary | ICD-10-CM | POA: Diagnosis not present

## 2024-03-26 DIAGNOSIS — G8929 Other chronic pain: Secondary | ICD-10-CM | POA: Diagnosis not present

## 2024-03-26 DIAGNOSIS — R6889 Other general symptoms and signs: Secondary | ICD-10-CM | POA: Diagnosis not present

## 2024-03-26 DIAGNOSIS — M549 Dorsalgia, unspecified: Secondary | ICD-10-CM | POA: Diagnosis not present

## 2024-03-26 DIAGNOSIS — Z23 Encounter for immunization: Secondary | ICD-10-CM | POA: Diagnosis not present

## 2024-03-26 DIAGNOSIS — R918 Other nonspecific abnormal finding of lung field: Secondary | ICD-10-CM | POA: Diagnosis not present

## 2024-03-26 DIAGNOSIS — J019 Acute sinusitis, unspecified: Secondary | ICD-10-CM | POA: Diagnosis not present

## 2024-03-26 NOTE — Progress Notes (Addendum)
 COVID Vaccine received:  []  No [x]  Yes Date of any COVID positive Test in last 90 days: no PCP - Rankin Dike PA-C Cardiologist - Redell Leiter MD Electrophys- Will Inocencio MD  Chest x-ray - Chest CT 01/16/24 Epic EKG -  03/16/24 Epic Stress Test - 03/25/19 Epic ECHO - 04/18/23 Epic Cardiac Cath - 04/18/23 Epic  Bowel Prep - [x]  No  []   Yes ______  Pacemaker / ICD device [x]  No []  Yes   Spinal Cord Stimulator:[x]  No []  Yes       History of Sleep Apnea? []  No [x]  Yes   CPAP used?- [x]  No []  Yes    Does the patient monitor blood sugar?          [x]  No []  Yes  []  N/A  Patient has: [x]  NO Hx DM   []  Pre-DM                 []  DM1  []   DM2 Does patient have a [employer] or Dexacom? []  No []  Yes   Fasting Blood Sugar Ranges-  Checks Blood Sugar _____ times a day  GLP1 agonist / usual dose - no GLP1 instructions:  SGLT-2 inhibitors / usual dose - no SGLT-2 instructions:   Blood Thinner / Instructions:no Aspirin  Instructions:no  Comments:   Activity level: Patient is able  to climb a flight of stairs without difficulty; [x]  No CP  [x]  No SOB,    Patient can perform ADLs without assistance.   Anesthesia review: Cardiac clearance- 12/19/23-Jennifer Carlin NP, A-fib, has Watchman, CAD, SVT, HTN, CHF, OSA, MI 2018  Patient denies shortness of breath, fever, cough and chest pain at PAT appointment.  Patient verbalized understanding and agreement to the Pre-Surgical Instructions that were given to them at this PAT appointment. Patient was also educated of the need to review these PAT instructions again prior to his/her surgery.I reviewed the appropriate phone numbers to call if they have any and questions or concerns.

## 2024-03-26 NOTE — Patient Instructions (Addendum)
 SURGICAL WAITING ROOM VISITATION  Patients having surgery or a procedure may have no more than 2 support people in the waiting area - these visitors may rotate.    Children under the age of 79 must have an adult with them who is not the patient.  Visitors with respiratory illnesses are discouraged from visiting and should remain at home.  If the patient needs to stay at the hospital during part of their recovery, the visitor guidelines for inpatient rooms apply. Pre-op nurse will coordinate an appropriate time for 1 support person to accompany patient in pre-op.  This support person may not rotate.    Please refer to the [facility] website for the visitor guidelines for Inpatients (after your surgery is over and you are in a regular room).       Your procedure is scheduled on: 04/09/24   Report to [facility] Main Entrance    Report to admitting at 7:30 AM   Call this number if you have problems the morning of surgery [phone]   Do not eat food :After Midnight.   After Midnight you may have the following liquids until 7 AM DAY OF SURGERY  Water Non-Citrus Juices (without pulp, NO RED-Apple, White grape, White cranberry) Black Coffee (NO MILK/CREAM OR CREAMERS, sugar ok)  Clear Tea (NO MILK/CREAM OR CREAMERS, sugar ok) regular and decaf                             Plain Jell-O (NO RED)                                           Fruit ices (not with fruit pulp, NO RED)                                     Popsicles (NO RED)                                                               Sports drinks like Gatorade (NO RED)                   The day of surgery:  Drink ONE (1) Pre-Surgery Clear Ensure  at 7 AM the morning of surgery. Drink in one sitting. Do not sip.  This drink was given to you during your hospital  pre-op appointment visit. Nothing else to drink after completing the  Pre-Surgery Clear Ensure.    Oral Hygiene is also important to reduce your risk  of infection.                                    Remember - BRUSH YOUR TEETH THE MORNING OF SURGERY WITH YOUR REGULAR TOOTHPASTE    Stop all vitamins and herbal supplements 7 days before surgery.   Take these medicines the morning of surgery with A SIP OF WATER: Amiodarone , ezetimibe , finasteride , lopid (gemfibrozil ), isosorbide , levothyroxine, pantoprazole , rosuvastatin , tamsulosin , tylenol , inhaler  Bring CPAP mask and tubing day  of surgery.                              You may not have any metal on your body including hair pins, jewelry, and body piercing             Do not wear lotions, powders, cologne, or deodorant              Men may shave face and neck.   Do not bring valuables to the hospital. [facility] IS NOT             RESPONSIBLE   FOR VALUABLES.   Contacts, glasses, dentures or bridgework may not be worn into surgery.  DO NOT BRING YOUR HOME MEDICATIONS TO THE HOSPITAL. PHARMACY WILL DISPENSE MEDICATIONS LISTED ON YOUR MEDICATION LIST TO YOU DURING YOUR ADMISSION IN THE HOSPITAL!    Patients discharged on the day of surgery will not be allowed to drive home.  Someone NEEDS to stay with you for the first 24 hours after anesthesia.   Special Instructions: Bring a copy of your healthcare power of attorney and living will documents the day of surgery if you haven't scanned them before.              Please read over the following fact sheets you were given: IF YOU HAVE QUESTIONS ABOUT YOUR PRE-OP INSTRUCTIONS PLEASE CALL [phone] Andrew Hardy   If you received a COVID test during your pre-op visit  it is requested that you wear a mask when out in public, stay away from anyone that may not be feeling well and notify your surgeon if you develop symptoms. If you test positive for Covid or have been in contact with anyone that has tested positive in the last 10 days please notify you surgeon.      Pre-operative 4 CHG Bath Instructions  DYNA-Hex 4 Chlorhexidine  Gluconate 4%  Solution Antiseptic 4 fl. oz   You can play a key role in reducing the risk of infection after surgery. Your skin needs to be as free of germs as possible. You can reduce the number of germs on your skin by washing with CHG (chlorhexidine  gluconate) soap before surgery. CHG is an antiseptic soap that kills germs and continues to kill germs even after washing.   DO NOT use if you have an allergy to chlorhexidine /CHG or antibacterial soaps. If your skin becomes reddened or irritated, stop using the CHG and notify one of our RNs at   Please shower with the CHG soap starting 4 days before surgery using the following schedule:     Please keep in mind the following:  DO NOT shave, including legs and underarms, starting the day of your first shower.   You may shave your face at any point before/day of surgery.  Place clean sheets on your bed the day you start using CHG soap. Use a clean washcloth (not used since being washed) for each shower. DO NOT sleep with pets once you start using the CHG.  CHG Shower Instructions:  If you choose to wash your hair and private area, wash first with your normal shampoo/soap.  After you use shampoo/soap, rinse your hair and body thoroughly to remove shampoo/soap residue.  Turn the water OFF and apply about 3 tablespoons (45 ml) of CHG soap to a CLEAN washcloth.  Apply CHG soap ONLY FROM YOUR NECK DOWN TO YOUR TOES (washing for 3-5 minutes)  DO  NOT use CHG soap on face, private areas, open wounds, or sores.  Pay special attention to the area where your surgery is being performed.  If you are having back surgery, having someone wash your back for you may be helpful. Wait 2 minutes after CHG soap is applied, then you may rinse off the CHG soap.  Pat dry with a clean towel  Put on clean clothes/pajamas   If you choose to wear lotion, please use ONLY the CHG-compatible lotions on the back of this paper.     Additional instructions for the day of surgery: DO NOT  APPLY any lotions, deodorants, cologne, or perfumes.   Put on clean/comfortable clothes.  Brush your teeth.  Ask your nurse before applying any prescription medications to the skin.   CHG Compatible Lotions   Aveeno Moisturizing lotion  Cetaphil Moisturizing Cream  Cetaphil Moisturizing Lotion  Clairol Herbal Essence Moisturizing Lotion, Dry Skin  Clairol Herbal Essence Moisturizing Lotion, Extra Dry Skin  Clairol Herbal Essence Moisturizing Lotion, Normal Skin  Curel Age Defying Therapeutic Moisturizing Lotion with Alpha Hydroxy  Curel Extreme Care Body Lotion  Curel Soothing Hands Moisturizing Hand Lotion  Curel Therapeutic Moisturizing Cream, Fragrance-Free  Curel Therapeutic Moisturizing Lotion, Fragrance-Free  Curel Therapeutic Moisturizing Lotion, Original Formula  Eucerin Daily Replenishing Lotion  Eucerin Dry Skin Therapy Plus Alpha Hydroxy Crme  Eucerin Dry Skin Therapy Plus Alpha Hydroxy Lotion  Eucerin Original Crme  Eucerin Original Lotion  Eucerin Plus Crme Eucerin Plus Lotion  Eucerin TriLipid Replenishing Lotion  Keri Anti-Bacterial Hand Lotion  Keri Deep Conditioning Original Lotion Dry Skin Formula Softly Scented  Keri Deep Conditioning Original Lotion, Fragrance Free Sensitive Skin Formula  Keri Lotion Fast Absorbing Fragrance Free Sensitive Skin Formula  Keri Lotion Fast Absorbing Softly Scented Dry Skin Formula  Keri Original Lotion  Keri Skin Renewal Lotion Keri Silky Smooth Lotion  Keri Silky Smooth Sensitive Skin Lotion  Nivea Body Creamy Conditioning Oil  Nivea Body Extra Enriched Lotion  Nivea Body Original Lotion  Nivea Body Sheer Moisturizing Lotion Nivea Crme  Nivea Skin Firming Lotion  NutraDerm 30 Skin Lotion  NutraDerm Skin Lotion  NutraDerm Therapeutic Skin Cream  NutraDerm Therapeutic Skin Lotion  ProShield Protective Hand Cream  Incentive Spirometer  An incentive spirometer is a tool that can help keep your lungs clear and active.  This tool measures how well you are filling your lungs with each breath. Taking long deep breaths may help reverse or decrease the chance of developing breathing (pulmonary) problems (especially infection) following: A long period of time when you are unable to move or be active. BEFORE THE PROCEDURE  If the spirometer includes an indicator to show your best effort, your nurse or respiratory therapist will set it to a desired goal. If possible, sit up straight or lean slightly forward. Try not to slouch. Hold the incentive spirometer in an upright position. INSTRUCTIONS FOR USE  Sit on the edge of your bed if possible, or sit up as far as you can in bed or on a chair. Hold the incentive spirometer in an upright position. Breathe out normally. Place the mouthpiece in your mouth and seal your lips tightly around it. Breathe in slowly and as deeply as possible, raising the piston or the ball toward the top of the column. Hold your breath for 3-5 seconds or for as long as possible. Allow the piston or ball to fall to the bottom of the column. Remove the mouthpiece from your mouth and breathe  out normally. Rest for a few seconds and repeat Steps 1 through 7 at least 10 times every 1-2 hours when you are awake. Take your time and take a few normal breaths between deep breaths. The spirometer may include an indicator to show your best effort. Use the indicator as a goal to work toward during each repetition. After each set of 10 deep breaths, practice coughing to be sure your lungs are clear. If you have an incision (the cut made at the time of surgery), support your incision when coughing by placing a pillow or rolled up towels firmly against it. Once you are able to get out of bed, walk around indoors and cough well. You may stop using the incentive spirometer when instructed by your caregiver.  RISKS AND COMPLICATIONS Take your time so you do not get dizzy or light-headed. If you are in pain, you may  need to take or ask for pain medication before doing incentive spirometry. It is harder to take a deep breath if you are having pain. AFTER USE Rest and breathe slowly and easily. It can be helpful to keep track of a log of your progress. Your caregiver can provide you with a simple table to help with this. If you are using the spirometer at home, follow these instructions: SEEK MEDICAL CARE IF:  You are having difficultly using the spirometer. You have trouble using the spirometer as often as instructed. Your pain medication is not giving enough relief while using the spirometer. You develop fever of 100.5 F (38.1 C) or higher. SEEK IMMEDIATE MEDICAL CARE IF:  You cough up bloody sputum that had not been present before. You develop fever of 102 F (38.9 C) or greater. You develop worsening pain at or near the incision site. MAKE SURE YOU:  Understand these instructions. Will watch your condition. Will get help right away if you are not doing well or get worse. Document Released: 08/19/2006 Document Revised: 07/01/2011 Document Reviewed: 10/20/2006 [facility] Patient Information 2014 [place], [place]- Preparing for Total Shoulder Arthroplasty    Before surgery, you can play an important role. Because skin is not sterile, your skin needs to be as free of germs as possible. You can reduce the number of germs on your skin by using the following products. Benzoyl Peroxide Gel Reduces the number of germs present on the skin Applied twice a day to shoulder area starting two days before surgery    ==================================================================  Please follow these instructions carefully:  BENZOYL PEROXIDE 5% GEL  Please do not use if you have an allergy to benzoyl peroxide.   If your skin becomes reddened/irritated stop using the benzoyl peroxide.  Starting two days before surgery, apply as follows: Apply benzoyl peroxide in the morning and at night. Apply  after taking a shower. If you are not taking a shower clean entire shoulder front, back, and side along with the armpit with a clean wet washcloth.  Place a quarter-sized dollop on your shoulder and rub in thoroughly, making sure to cover the front, back, and side of your shoulder, along with the armpit.   2 days before ____ AM   ____ PM              1 day before ____ AM   ____ PM                         Do this twice a day for two days.  (Last application  is the night before surgery, AFTER using the CHG soap as described below).  Do NOT apply benzoyl peroxide gel on the day of surgery.

## 2024-03-29 ENCOUNTER — Encounter (HOSPITAL_COMMUNITY): Payer: Self-pay

## 2024-03-29 ENCOUNTER — Other Ambulatory Visit: Payer: Self-pay

## 2024-03-29 ENCOUNTER — Encounter (HOSPITAL_COMMUNITY)
Admission: RE | Admit: 2024-03-29 | Discharge: 2024-03-29 | Disposition: A | Source: Ambulatory Visit | Attending: Orthopedic Surgery

## 2024-03-29 VITALS — BP 102/62 | HR 98 | Temp 97.7°F | Resp 18 | Ht 67.0 in | Wt 204.0 lb

## 2024-03-29 DIAGNOSIS — I10 Essential (primary) hypertension: Secondary | ICD-10-CM

## 2024-03-29 DIAGNOSIS — Z01818 Encounter for other preprocedural examination: Secondary | ICD-10-CM

## 2024-03-29 HISTORY — DX: Hypothyroidism, unspecified: E03.9

## 2024-03-29 LAB — SURGICAL PCR SCREEN
MRSA, PCR: NEGATIVE
Staphylococcus aureus: NEGATIVE

## 2024-03-29 LAB — CBC
HCT: 38.4 % — ABNORMAL LOW (ref 39.0–52.0)
Hemoglobin: 12.4 g/dL — ABNORMAL LOW (ref 13.0–17.0)
MCH: 31.1 pg (ref 26.0–34.0)
MCHC: 32.3 g/dL (ref 30.0–36.0)
MCV: 96.2 fL (ref 80.0–100.0)
Platelets: 159 K/uL (ref 150–400)
RBC: 3.99 MIL/uL — ABNORMAL LOW (ref 4.22–5.81)
RDW: 13.9 % (ref 11.5–15.5)
WBC: 5.3 K/uL (ref 4.0–10.5)
nRBC: 0 % (ref 0.0–0.2)

## 2024-03-30 NOTE — Anesthesia Preprocedure Evaluation (Signed)
 Anesthesia Evaluation    Airway       Comment: Previous grade II view with MAC 3, easy mask Dental   Pulmonary former smoker          Cardiovascular hypertension (ISMN, furosemide ), Pt. on medications + angina  + CAD, + Past MI (2018), + Cardiac Stents (2016, 2018, 2023) and +CHF  + dysrhythmias (RBBB, PVCs; has Watchman) Atrial Fibrillation and Supra Ventricular Tachycardia   HLD  LHC 04/18/2023: 1. Nonobstructive CAD. Continued patency of multiple stents 2. Mildly elevated LVEDP 21 mm Hg  TTE 04/18/2023: IMPRESSIONS    1. Left ventricular ejection fraction, by estimation, is 55 to 60%. The  left ventricle has normal function.   2. Right ventricular systolic function is moderately reduced. The right  ventricular size is normal.   3. There isa 24 mm Watchman FLX present. No device leak. No DRT. Average  compression 14%. Well seated device. Left atrial size was severely  dilated. No left atrial/left atrial appendage thrombus was detected.   4. The mitral valve is normal in structure. Mild mitral valve  regurgitation. No evidence of mitral stenosis.   5. The aortic valve is tricuspid. Aortic valve regurgitation is mild.  Aortic valve sclerosis is present, with no evidence of aortic valve  stenosis.   6. There is mild (Grade II) plaque involving the descending aorta.   7. Evidence of atrial level shunting detected by color flow Doppler.  Minimal iatrogenic ASD is left.      Neuro/Psych  PSYCHIATRIC DISORDERS Anxiety Depression     Neuromuscular disease (lumbar radiculopathy)    GI/Hepatic ,GERD (Barrett's esophagus)  Medicated,,  Endo/Other  Hypothyroidism    Renal/GU Renal InsufficiencyRenal disease     Musculoskeletal   Abdominal   Peds  Hematology  (+) Blood dyscrasia, anemia Lab Results      Component                Value               Date                      WBC                      5.3                  03/29/2024                HGB                      12.4 (L)            03/29/2024                HCT                      38.4 (L)            03/29/2024                MCV                      96.2                03/29/2024                PLT  159                 03/29/2024              Anesthesia Other Findings   Reproductive/Obstetrics                              Anesthesia Physical Anesthesia Plan  ASA: 3  Anesthesia Plan: General   Post-op Pain Management: Regional block* and Tylenol  PO (pre-op)*   Induction: Intravenous  PONV Risk Score and Plan: 2 and Ondansetron , Dexamethasone  and Treatment may vary due to age or medical condition  Airway Management Planned: Oral ETT  Additional Equipment:   Intra-op Plan:   Post-operative Plan: Extubation in OR  Informed Consent:   Plan Discussed with:   Anesthesia Plan Comments: (See PAT note 03/29/2024)         Anesthesia Quick Evaluation

## 2024-03-30 NOTE — Progress Notes (Signed)
 Anesthesia Chart Review   Case: [id number] Date/Time: 04/09/24 0945   Procedure: ARTHROPLASTY, SHOULDER, TOTAL, REVERSE (Right: Shoulder)   Anesthesia type: Choice   Diagnosis: Rotator cuff syndrome of right shoulder [M75.101]   Pre-op diagnosis: Right rotator cuff tear arthropathy   Location: [facility]OM 07 / [facility]   Surgeons: Kay Kemps, MD       DISCUSSION:83 y.o. former smoker with h/o HTN, GERD, CAD s/p multiple PCIs, CHF, RBBB, paroxysmal atrial fibrillation with Watchman device in place, right rotator cuff tear scheduled for above procedure 04/09/2024 with Dr. Kemps Kay.   Pt last seen by cardiology 03/16/2024. Per OV note pt maintaining sinus rhythm with low-dose amiodarone , stable CAD. 6 month follow up recommended. He reports to PAT nurse he can climb a flight of stairs without chest pain or shortness of breath. Cardiac Cath 04/18/2023 with nonobstructive CAD.   Per cardiology preoperative evaluation, According to the Revised Cardiac Risk Index (RCRI), his Perioperative Risk of Major Cardiac Event is (%): 6.6 His Functional Capacity in METs is: 6.61 according to the Duke Activity Status Index (DASI). Therefore, based on ACC/AHA guidelines, patient would be at acceptable risk for the planned procedure without further cardiovascular testing. I will route this recommendation to the requesting party via Epic fax function.  VS: BP 102/62   Pulse 98   Temp 36.5 C (Oral)   Resp 18   Ht 5' 7 (1.702 m)   Wt 92.5 kg   SpO2 98%   BMI 31.95 kg/m   PROVIDERS: Montey Lot, PA-C is PCP   Cardiologist - Redell Leiter MD  LABS: Labs reviewed: Acceptable for surgery. (all labs ordered are listed, but only abnormal results are displayed)  Labs Reviewed  CBC - Abnormal; Notable for the following components:      Result Value   RBC 3.99 (*)    Hemoglobin 12.4 (*)    HCT 38.4 (*)    All other components within normal limits  SURGICAL PCR SCREEN      IMAGES:   EKG:   CV: Cardiac Cath 04/18/2023 Nonobstructive CAD. Continued patency of multiple stents Mildly elevated LVEDP 21 mm Hg   Plan; continue medical therapy  Echo 04/18/2023  1. Left ventricular ejection fraction, by estimation, is 55 to 60%. The  left ventricle has normal function.   2. Right ventricular systolic function is moderately reduced. The right  ventricular size is normal.   3. There isa 24 mm Watchman FLX present. No device leak. No DRT. Average  compression 14%. Well seated device. Left atrial size was severely  dilated. No left atrial/left atrial appendage thrombus was detected.   4. The mitral valve is normal in structure. Mild mitral valve  regurgitation. No evidence of mitral stenosis.   5. The aortic valve is tricuspid. Aortic valve regurgitation is mild.  Aortic valve sclerosis is present, with no evidence of aortic valve  stenosis.   6. There is mild (Grade II) plaque involving the descending aorta.   7. Evidence of atrial level shunting detected by color flow Doppler.  Minimal iatrogenic ASD is left.  Past Medical History:  Diagnosis Date   Acute blood loss anemia    Angiodysplasia of colon with hemorrhage    Anxiety    Arthritis    Atrial fibrillation (HCC) 02/20/2023   Atypical atrial flutter (HCC) 12/14/2014   Barrett's esophagus with dysplasia    Benign neoplasm of ascending colon    Bradycardia, sinus 10/15/2014   Burning sensation of feet 01/17/2016  CAD, multiple vessel 10/18/2014   Overview:  DES to [facility] for unstable angina 10/17/14 Diagnostic Summary Severe stenosis of mid Cx likely cause of recent accelerating angina Diffuse severe stenosis of small caliber LAD unchanged from 2012 Normal LV function Paroxysmal SVT noted during procedure 04/23/16:  PCI / Resolute Drug Eluting Stent of the proximal Left Anterior Descending Coronary Artery. Successful PTCA of the ostial 1st Diagonal Coronary Artery   Chronic left-sided low back  pain with sciatica 02/21/2016   Chronic systolic CHF (congestive heart failure) (HCC) 04/19/2020   Closed fracture of tooth 03/03/2024   Coronary artery disease 2003   s/p MI and numerous stents   Coronary artery disease involving native coronary artery of native heart with angina pectoris 10/18/2014   Overview:  DES to [facility] for unstable angina 10/17/14 Diagnostic Summary Severe stenosis of mid Cx likely cause of recent accelerating angina Diffuse severe stenosis of small caliber LAD unchanged from 2012 Normal LV function Paroxysmal SVT noted during procedure 04/23/16:  PCI / Resolute Drug Eluting Stent of the proximal Left Anterior Descending Coronary Artery. 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   Degenerative spondylolisthesis 05/02/2017   Depression    Dyspnea    WITH EXERTION   Enlarged prostate without lower urinary tract symptoms (luts) 2012   frequent urination and nocturia; sees dr terra in [place]   Essential hypertension 10/15/2014   Full thickness rotator cuff tear 02/06/2021   Gastroesophageal reflux disease without esophagitis 04/21/2016   GERD (gastroesophageal reflux disease)    GI bleed 04/18/2020   H/O total shoulder replacement, left 11/23/2021   Hematochezia    Heme positive stool    Hyperlipidemia    Hypertension    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   Hypothyroidism    IHD (ischemic heart disease) 10/15/2014   Impingement syndrome of left shoulder region 04/26/2021   Lower limb pain, inferior, right 12/17/2017   Lumbar radiculopathy 02/21/2016   Myocardial infarction [facility]) 2003   NSTEMI (non-ST elevated myocardial infarction) (HCC) 04/24/2016   Overview:  LHC 04/24/15: Severe stenosis of the LAD & Diagonal bifurcation  LV ejection fraction is 55-60 %  Interventional Summary Successful Angiosculpt PCI / Resolute Drug Eluting Stent of the proximal LAD Successful PTCA of the ostial  1st Diagonal Coronary Artery   Pain in both feet 01/17/2016   Pain in right knee 10/20/2017   Pain of left hip joint 11/26/2017   Paroxysmal SVT (supraventricular tachycardia) 11/16/2014   Permanent atrial fibrillation (HCC) 02/20/2023   Precordial pain 10/15/2014   Presence of Watchman left atrial appendage closure device 02/20/2023   24mm Watchman FLX Pro with Dr. 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   Unspecified atrial fibrillation (HCC) 12/14/2014    Past Surgical History:  Procedure Laterality Date   BIOPSY  04/24/2020   Procedure: BIOPSY;  Surgeon: Albertus Gordy HERO, MD;  Location: [facility] ENDOSCOPY;  Service: Gastroenterology;;   CARDIAC CATHETERIZATION  [phone]  ptca/stent in [place]   CARDIAC CATHETERIZATION  04/23/2016   COLONOSCOPY WITH PROPOFOL  N/A 04/24/2020   Procedure: COLONOSCOPY WITH PROPOFOL ;  Surgeon: Albertus Gordy HERO, MD;  Location: [facility] ENDOSCOPY;  Service: Gastroenterology;  Laterality: N/A;   CORONARY ANGIOPLASTY     CORONARY STENT INTERVENTION N/A 08/10/2021   Procedure: CORONARY STENT INTERVENTION;  Surgeon: Anner Alm ORN, MD;  Location: [facility] INVASIVE CV LAB;  Service: Cardiovascular;  Laterality: N/A;   ESOPHAGOGASTRODUODENOSCOPY (EGD) WITH PROPOFOL  N/A 04/24/2020   Procedure: ESOPHAGOGASTRODUODENOSCOPY (EGD) WITH PROPOFOL ;  Surgeon: Albertus Gordy HERO, MD;  Location: [facility] ENDOSCOPY;  Service: Gastroenterology;  Laterality: N/A;   EYE SURGERY  05-1010   bil cataract  ,iol   FRACTURE SURGERY     HEMOSTASIS CONTROL  04/24/2020   Procedure: HEMOSTASIS CONTROL;  Surgeon: Albertus Gordy HERO, MD;  Location: [facility] ENDOSCOPY;  Service: Gastroenterology;;   I & D EXTREMITY Right 02/23/2015   Procedure: RIGHT RING FINGER REVISION AMPUTATION;  Surgeon: Franky Curia, MD;  Location: [facility] OR;  Service: Orthopedics;  Laterality: Right;   KNEE ARTHROSCOPY W/ MENISCAL REPAIR  2000   left knee   LEFT ATRIAL APPENDAGE OCCLUSION N/A 02/20/2023   Procedure: LEFT ATRIAL APPENDAGE OCCLUSION;  Surgeon: Cindie Ole DASEN, MD;  Location: [facility] INVASIVE CV LAB;  Service: Cardiovascular;  Laterality: N/A;   LEFT HEART CATH AND CORONARY ANGIOGRAPHY N/A 08/10/2021   Procedure: LEFT HEART CATH AND CORONARY ANGIOGRAPHY;  Surgeon: Anner Alm ORN, MD;  Location: [facility] INVASIVE CV LAB;  Service: Cardiovascular;  Laterality: N/A;   LEFT HEART CATH AND CORONARY ANGIOGRAPHY N/A 04/18/2023   Procedure: LEFT HEART CATH AND CORONARY ANGIOGRAPHY;  Surgeon: Jordan, Peter M, MD;  Location: [facility] INVASIVE CV LAB;  Service: Cardiovascular;  Laterality: N/A;   LUMBAR EPIDURAL INJECTION  2011   low back pain; DDD   POLYPECTOMY  04/24/2020   Procedure: POLYPECTOMY;  Surgeon: Albertus Gordy HERO, MD;  Location: [facility] ENDOSCOPY;  Service: Gastroenterology;;   REVERSE SHOULDER ARTHROPLASTY Left 11/23/2021   Procedure: REVERSE SHOULDER ARTHROPLASTY;  Surgeon: Kay Kemps, MD;  Location: [facility] ORS;  Service: Orthopedics;  Laterality: Left;   ring finger tramatic qamputation Right    SHOULDER ARTHROSCOPY  03/29/2011   Procedure: ARTHROSCOPY SHOULDER;  Surgeon: Elspeth JONELLE Kay;  Location: [facility] OR;  Service: Orthopedics;  Laterality: Right;  RIGHT SHOULDER ARTHROSCOPY WITH OPEN SUBSCAPULAR REPAIR   SHOULDER OPEN ROTATOR CUFF REPAIR     3 on rt shoulder; 2 on left   TRANSESOPHAGEAL ECHOCARDIOGRAM (CATH LAB) N/A 02/20/2023   Procedure: TRANSESOPHAGEAL ECHOCARDIOGRAM;  Surgeon: Cindie Ole DASEN, MD;  Location: [facility] INVASIVE CV LAB;  Service:  Cardiovascular;  Laterality: N/A;   TRANSESOPHAGEAL ECHOCARDIOGRAM (CATH LAB) N/A 04/18/2023   Procedure: TRANSESOPHAGEAL ECHOCARDIOGRAM;  Surgeon: Santo Stanly LABOR, MD;  Location: [facility] INVASIVE CV LAB;  Service: Cardiovascular;  Laterality: N/A;   TRANSFORAMINAL LUMBAR INTERBODY FUSION (TLIF) WITH PEDICLE SCREW FIXATION 1 LEVEL N/A 04/24/2017   Procedure: TRANSFORAMINAL LUMBAR INTERBODY FUSION (TLIF) L4-5;  Surgeon: Burnetta Aures, MD;  Location: [facility] OR;  Service: Orthopedics;  Laterality: N/A;  4 hrs    MEDICATIONS:  acetaminophen  (TYLENOL ) 500 MG tablet   amiodarone  (PACERONE ) 200 MG tablet   Ascorbic Acid  (VITAMIN C) 1000 MG tablet   Coenzyme Q10 (COQ10) 50 MG CAPS   cyanocobalamin  (VITAMIN B12) 1000 MCG tablet   ezetimibe  (ZETIA ) 10 MG tablet   finasteride  (PROSCAR ) 5 MG tablet   Flaxseed, Linseed, (FLAXSEED OIL MAX STR) 1300 MG CAPS   furosemide  (LASIX ) 80  MG tablet   gemfibrozil  (LOPID ) 600 MG tablet   Glucosamine HCl (GLUCOSAMINE PO)   isosorbide  mononitrate (IMDUR ) 60 MG 24 hr tablet   levothyroxine (SYNTHROID) 25 MCG tablet   Multiple Vitamins-Minerals (MULTIVITAMINS THER. W/MINERALS) TABS   nitroGLYCERIN  (NITROSTAT ) 0.4 MG SL tablet   Omega-3 Fatty Acids (FISH OIL) 1000 MG CAPS   pantoprazole  (PROTONIX ) 40 MG tablet   potassium chloride  SA (KLOR-CON  M) 20 MEQ tablet   rosuvastatin  (CRESTOR ) 10 MG tablet   tamsulosin  (FLOMAX ) 0.4 MG CAPS capsule   traZODone  (DESYREL ) 100 MG tablet   TURMERIC CURCUMIN PO   umeclidinium-vilanterol (ANORO ELLIPTA ) 62.5-25 MCG/ACT AEPB   No current facility-administered medications for this encounter.     Harlene Hoots Ward, PA-C [facility] Pre-Surgical Testing [phone]

## 2024-03-31 ENCOUNTER — Other Ambulatory Visit: Payer: Self-pay | Admitting: Cardiology

## 2024-04-09 ENCOUNTER — Ambulatory Visit (HOSPITAL_COMMUNITY)

## 2024-04-09 ENCOUNTER — Encounter (HOSPITAL_COMMUNITY): Payer: Self-pay | Admitting: Medical

## 2024-04-09 ENCOUNTER — Ambulatory Visit (HOSPITAL_COMMUNITY)
Admission: RE | Admit: 2024-04-09 | Discharge: 2024-04-09 | Disposition: A | Discharge status: Home / Self Care | Source: Ambulatory Visit | Attending: Orthopedic Surgery | Admitting: Orthopedic Surgery

## 2024-04-09 ENCOUNTER — Encounter (HOSPITAL_COMMUNITY): Admission: RE | Disposition: A | Payer: Self-pay | Source: Ambulatory Visit | Attending: Orthopedic Surgery

## 2024-04-09 ENCOUNTER — Ambulatory Visit (HOSPITAL_BASED_OUTPATIENT_CLINIC_OR_DEPARTMENT_OTHER): Payer: Self-pay | Admitting: Anesthesiology

## 2024-04-09 ENCOUNTER — Other Ambulatory Visit: Payer: Self-pay

## 2024-04-09 ENCOUNTER — Encounter (HOSPITAL_COMMUNITY): Payer: Self-pay | Admitting: Orthopedic Surgery

## 2024-04-09 DIAGNOSIS — Z87891 Personal history of nicotine dependence: Secondary | ICD-10-CM | POA: Diagnosis not present

## 2024-04-09 DIAGNOSIS — M19011 Primary osteoarthritis, right shoulder: Secondary | ICD-10-CM | POA: Insufficient documentation

## 2024-04-09 DIAGNOSIS — I110 Hypertensive heart disease with heart failure: Secondary | ICD-10-CM | POA: Diagnosis not present

## 2024-04-09 DIAGNOSIS — Z79899 Other long term (current) drug therapy: Secondary | ICD-10-CM | POA: Diagnosis not present

## 2024-04-09 DIAGNOSIS — E039 Hypothyroidism, unspecified: Secondary | ICD-10-CM | POA: Insufficient documentation

## 2024-04-09 DIAGNOSIS — M25711 Osteophyte, right shoulder: Secondary | ICD-10-CM | POA: Insufficient documentation

## 2024-04-09 DIAGNOSIS — I4821 Permanent atrial fibrillation: Secondary | ICD-10-CM | POA: Diagnosis not present

## 2024-04-09 DIAGNOSIS — I4510 Unspecified right bundle-branch block: Secondary | ICD-10-CM | POA: Diagnosis not present

## 2024-04-09 DIAGNOSIS — I493 Ventricular premature depolarization: Secondary | ICD-10-CM | POA: Insufficient documentation

## 2024-04-09 DIAGNOSIS — I5022 Chronic systolic (congestive) heart failure: Secondary | ICD-10-CM | POA: Diagnosis not present

## 2024-04-09 DIAGNOSIS — I252 Old myocardial infarction: Secondary | ICD-10-CM | POA: Diagnosis not present

## 2024-04-09 DIAGNOSIS — I2510 Atherosclerotic heart disease of native coronary artery without angina pectoris: Secondary | ICD-10-CM

## 2024-04-09 DIAGNOSIS — I10 Essential (primary) hypertension: Secondary | ICD-10-CM | POA: Diagnosis not present

## 2024-04-09 DIAGNOSIS — I25119 Atherosclerotic heart disease of native coronary artery with unspecified angina pectoris: Secondary | ICD-10-CM | POA: Diagnosis not present

## 2024-04-09 DIAGNOSIS — Z95818 Presence of other cardiac implants and grafts: Secondary | ICD-10-CM | POA: Insufficient documentation

## 2024-04-09 DIAGNOSIS — M75101 Unspecified rotator cuff tear or rupture of right shoulder, not specified as traumatic: Secondary | ICD-10-CM | POA: Insufficient documentation

## 2024-04-09 HISTORY — PX: REVERSE SHOULDER ARTHROPLASTY: SHX5054

## 2024-04-09 SURGERY — ARTHROPLASTY, SHOULDER, TOTAL, REVERSE
Anesthesia: General | Site: Shoulder | Laterality: Right

## 2024-04-09 MED ORDER — EPHEDRINE SULFATE (PRESSORS) 25 MG/5ML IV SOSY
PREFILLED_SYRINGE | INTRAVENOUS | Status: DC | PRN
  Administered 2024-04-09 (×3): 5 mg via INTRAVENOUS
  Administered 2024-04-09 (×2): 10 mg via INTRAVENOUS
  Administered 2024-04-09 (×3): 5 mg via INTRAVENOUS

## 2024-04-09 MED ORDER — AMISULPRIDE (ANTIEMETIC) 5 MG/2ML IV SOLN: 10.0000 mg | Freq: Once | INTRAVENOUS | Status: DC | PRN

## 2024-04-09 MED ORDER — VANCOMYCIN HCL 1000 MG IV SOLR
INTRAVENOUS | Status: AC
  Filled 2024-04-09: qty 20

## 2024-04-09 MED ORDER — FENTANYL CITRATE (PF) 50 MCG/ML IJ SOSY
25.0000 ug | PREFILLED_SYRINGE | INTRAMUSCULAR | Status: DC | PRN
  Administered 2024-04-09 (×3): 50 ug via INTRAVENOUS

## 2024-04-09 MED ORDER — BACITRACIN ZINC 500 UNIT/GM EX OINT
TOPICAL_OINTMENT | CUTANEOUS | Status: DC | PRN
  Administered 2024-04-09: 1 via TOPICAL

## 2024-04-09 MED ORDER — CEFAZOLIN SODIUM-DEXTROSE 2-4 GM/100ML-% IV SOLN
2.0000 g | INTRAVENOUS | Status: AC
  Administered 2024-04-09: 2 g via INTRAVENOUS
  Filled 2024-04-09: qty 100

## 2024-04-09 MED ORDER — METHOCARBAMOL 500 MG PO TABS: 500.0000 mg | ORAL_TABLET | Freq: Three times a day (TID) | ORAL | 1 refills | Status: AC | PRN

## 2024-04-09 MED ORDER — LIDOCAINE HCL (PF) 2 % IJ SOLN
INTRAMUSCULAR | Status: AC
  Filled 2024-04-09: qty 5

## 2024-04-09 MED ORDER — PROPOFOL 10 MG/ML IV BOLUS
INTRAVENOUS | Status: AC
  Filled 2024-04-09: qty 20

## 2024-04-09 MED ORDER — FENTANYL CITRATE (PF) 50 MCG/ML IJ SOSY
50.0000 ug | PREFILLED_SYRINGE | Freq: Once | INTRAMUSCULAR | Status: AC
  Administered 2024-04-09: 50 ug via INTRAVENOUS
  Filled 2024-04-09: qty 2

## 2024-04-09 MED ORDER — FENTANYL CITRATE (PF) 50 MCG/ML IJ SOSY
PREFILLED_SYRINGE | INTRAMUSCULAR | Status: AC
  Filled 2024-04-09: qty 2

## 2024-04-09 MED ORDER — PHENYLEPHRINE HCL-NACL 20-0.9 MG/250ML-% IV SOLN
INTRAVENOUS | Status: DC | PRN
  Administered 2024-04-09: 35 ug/min via INTRAVENOUS

## 2024-04-09 MED ORDER — FENTANYL CITRATE (PF) 50 MCG/ML IJ SOSY
PREFILLED_SYRINGE | INTRAMUSCULAR | Status: AC
  Filled 2024-04-09: qty 1

## 2024-04-09 MED ORDER — ONDANSETRON HCL 4 MG/2ML IJ SOLN
INTRAMUSCULAR | Status: AC
  Filled 2024-04-09: qty 2

## 2024-04-09 MED ORDER — BACITRACIN ZINC 500 UNIT/GM EX OINT
TOPICAL_OINTMENT | CUTANEOUS | Status: AC
  Filled 2024-04-09: qty 28.35

## 2024-04-09 MED ORDER — STERILE WATER FOR IRRIGATION IR SOLN
Status: DC | PRN
  Administered 2024-04-09: 2000 mL

## 2024-04-09 MED ORDER — SUGAMMADEX SODIUM 200 MG/2ML IV SOLN
INTRAVENOUS | Status: AC
  Filled 2024-04-09: qty 2

## 2024-04-09 MED ORDER — EPHEDRINE 5 MG/ML INJ
INTRAVENOUS | Status: AC
  Filled 2024-04-09: qty 5

## 2024-04-09 MED ORDER — ROCURONIUM BROMIDE 10 MG/ML (PF) SYRINGE
PREFILLED_SYRINGE | INTRAVENOUS | Status: DC | PRN
  Administered 2024-04-09: 60 mg via INTRAVENOUS

## 2024-04-09 MED ORDER — BUPIVACAINE-EPINEPHRINE (PF) 0.25% -1:200000 IJ SOLN
INTRAMUSCULAR | Status: AC
  Filled 2024-04-09: qty 30

## 2024-04-09 MED ORDER — LACTATED RINGERS IV SOLN: INTRAVENOUS | Status: DC

## 2024-04-09 MED ORDER — ONDANSETRON HCL 4 MG/2ML IJ SOLN
INTRAMUSCULAR | Status: DC | PRN
  Administered 2024-04-09: 4 mg via INTRAVENOUS

## 2024-04-09 MED ORDER — SUGAMMADEX SODIUM 200 MG/2ML IV SOLN
INTRAVENOUS | Status: DC | PRN
  Administered 2024-04-09: 200 mg via INTRAVENOUS

## 2024-04-09 MED ORDER — BUPIVACAINE LIPOSOME 1.3 % IJ SUSP
INTRAMUSCULAR | Status: DC | PRN
  Administered 2024-04-09: 10 mL via PERINEURAL

## 2024-04-09 MED ORDER — BUPIVACAINE-EPINEPHRINE (PF) 0.25% -1:200000 IJ SOLN
INTRAMUSCULAR | Status: DC | PRN
  Administered 2024-04-09: 17 mL

## 2024-04-09 MED ORDER — VANCOMYCIN HCL 1 G IV SOLR
INTRAVENOUS | Status: DC | PRN
  Administered 2024-04-09: 1000 mg

## 2024-04-09 MED ORDER — ORAL CARE MOUTH RINSE: 15.0000 mL | Freq: Once | OROMUCOSAL | Status: AC

## 2024-04-09 MED ORDER — ONDANSETRON HCL 4 MG PO TABS: 4.0000 mg | ORAL_TABLET | Freq: Three times a day (TID) | ORAL | 1 refills | Status: AC | PRN

## 2024-04-09 MED ORDER — LIDOCAINE HCL (PF) 2 % IJ SOLN
INTRAMUSCULAR | Status: DC | PRN
  Administered 2024-04-09: 100 mg via INTRADERMAL

## 2024-04-09 MED ORDER — OXYCODONE HCL 5 MG PO TABS: 5.0000 mg | ORAL_TABLET | Freq: Three times a day (TID) | ORAL | 0 refills | Status: AC | PRN

## 2024-04-09 MED ORDER — DEXAMETHASONE SOD PHOSPHATE PF 10 MG/ML IJ SOLN
INTRAMUSCULAR | Status: DC | PRN
  Administered 2024-04-09: 4 mg via INTRAVENOUS

## 2024-04-09 MED ORDER — FENTANYL CITRATE (PF) 100 MCG/2ML IJ SOLN
INTRAMUSCULAR | Status: DC | PRN
  Administered 2024-04-09: 50 ug via INTRAVENOUS

## 2024-04-09 MED ORDER — CHLORHEXIDINE GLUCONATE 0.12 % MT SOLN
15.0000 mL | Freq: Once | OROMUCOSAL | Status: AC
  Administered 2024-04-09: 15 mL via OROMUCOSAL

## 2024-04-09 MED ORDER — FENTANYL CITRATE (PF) 100 MCG/2ML IJ SOLN
INTRAMUSCULAR | Status: AC
  Filled 2024-04-09: qty 2

## 2024-04-09 MED ORDER — PROPOFOL 10 MG/ML IV BOLUS
INTRAVENOUS | Status: DC | PRN
  Administered 2024-04-09: 100 mg via INTRAVENOUS

## 2024-04-09 MED ORDER — ROCURONIUM BROMIDE 10 MG/ML (PF) SYRINGE
PREFILLED_SYRINGE | INTRAVENOUS | Status: AC
  Filled 2024-04-09: qty 10

## 2024-04-09 MED ORDER — ACETAMINOPHEN 500 MG PO TABS
1000.0000 mg | ORAL_TABLET | Freq: Once | ORAL | Status: AC
  Administered 2024-04-09: 1000 mg via ORAL
  Filled 2024-04-09: qty 2

## 2024-04-09 MED ORDER — BUPIVACAINE HCL (PF) 0.5 % IJ SOLN
INTRAMUSCULAR | Status: DC | PRN
  Administered 2024-04-09: 10 mL via PERINEURAL

## 2024-04-09 MED ORDER — TRANEXAMIC ACID-NACL 1000-0.7 MG/100ML-% IV SOLN
1000.0000 mg | INTRAVENOUS | Status: AC
  Administered 2024-04-09: 1000 mg via INTRAVENOUS
  Filled 2024-04-09: qty 100

## 2024-04-09 MED ORDER — MIDAZOLAM HCL (PF) 2 MG/2ML IJ SOLN: 1.0000 mg | Freq: Once | INTRAMUSCULAR | Status: DC

## 2024-04-09 MED ORDER — 0.9 % SODIUM CHLORIDE (POUR BTL) OPTIME
TOPICAL | Status: DC | PRN
  Administered 2024-04-09: 1000 mL

## 2024-04-09 SURGICAL SUPPLY — 63 items
BAG COUNTER SPONGE SURGICOUNT (BAG) ×1 IMPLANT
BAG ZIPLOCK 12X15 (MISCELLANEOUS) IMPLANT
BASEPLATE GLENOSPHERE 25 (Plate) IMPLANT
BIT DRILL 1.6MX128 (BIT) IMPLANT
BIT DRILL F/CENTRAL SCRW 3.2MM (BIT) IMPLANT
BIT DRILL TWIST 2.7 (BIT) IMPLANT
BLADE SAG 18X100X1.27 (BLADE) ×1 IMPLANT
COMP HUM UNI IDENTI -6 EXT (Joint) IMPLANT
COMP HUM UNI IDENTI 36 +0 (Joint) IMPLANT
COVER BACK TABLE 60X90IN (DRAPES) ×1 IMPLANT
COVER SURGICAL LIGHT HANDLE (MISCELLANEOUS) ×1 IMPLANT
DRAPE INCISE IOBAN 66X45 STRL (DRAPES) ×1 IMPLANT
DRAPE POUCH INSTRU U-SHP 10X18 (DRAPES) ×1 IMPLANT
DRAPE SHEET LG 3/4 BI-LAMINATE (DRAPES) ×1 IMPLANT
DRAPE SURG ORHT 6 SPLT 77X108 (DRAPES) ×2 IMPLANT
DRAPE TOP 10253 STERILE (DRAPES) ×1 IMPLANT
DRAPE U-SHAPE 47X51 STRL (DRAPES) ×1 IMPLANT
DRESSING AQUACEL AG SP 3.5X10 (GAUZE/BANDAGES/DRESSINGS) IMPLANT
DRSG ADAPTIC 3X8 NADH LF (GAUZE/BANDAGES/DRESSINGS) ×1 IMPLANT
DRSG AQUACEL AG ADV 3.5X10 (GAUZE/BANDAGES/DRESSINGS) ×1 IMPLANT
DRSG CURAD 3X16 NADH (PACKING) IMPLANT
DRSG TEGADERM 4X4.75 (GAUZE/BANDAGES/DRESSINGS) IMPLANT
DURAPREP 26ML APPLICATOR (WOUND CARE) ×1 IMPLANT
ELECT BLADE TIP CTD 4 INCH (ELECTRODE) ×1 IMPLANT
ELECT NDL TIP 2.8 STRL (NEEDLE) ×1 IMPLANT
ELECT NEEDLE TIP 2.8 STRL (NEEDLE) ×1 IMPLANT
ELECT PENCIL ROCKER SW 15FT (MISCELLANEOUS) ×1 IMPLANT
ELECT REM PT RETURN 15FT ADLT (MISCELLANEOUS) ×1 IMPLANT
FACESHIELD WRAPAROUND OR TEAM (MASK) ×1 IMPLANT
GAUZE PAD ABD 8X10 STRL (GAUZE/BANDAGES/DRESSINGS) ×1 IMPLANT
GAUZE SPONGE 4X4 12PLY STRL (GAUZE/BANDAGES/DRESSINGS) ×1 IMPLANT
GLENOID SPHERE STD STRL 36MM (Orthopedic Implant) IMPLANT
GLOVE BIOGEL PI IND STRL 7.5 (GLOVE) ×1 IMPLANT
GLOVE BIOGEL PI IND STRL 8.5 (GLOVE) ×1 IMPLANT
GLOVE ORTHO TXT STRL SZ7.5 (GLOVE) ×1 IMPLANT
GLOVE SURG ORTHO 8.5 STRL (GLOVE) ×1 IMPLANT
GOWN STRL REUS W/ TWL XL LVL3 (GOWN DISPOSABLE) ×2 IMPLANT
KIT BASIN OR (CUSTOM PROCEDURE TRAY) ×1 IMPLANT
KIT TURNOVER KIT A (KITS) ×1 IMPLANT
MANIFOLD NEPTUNE II (INSTRUMENTS) ×1 IMPLANT
NDL MAYO CATGUT SZ4 TPR NDL (NEEDLE) IMPLANT
NEEDLE MAYO CATGUT SZ4 (NEEDLE) IMPLANT
NS IRRIG 1000ML POUR BTL (IV SOLUTION) ×1 IMPLANT
PACK SHOULDER (CUSTOM PROCEDURE TRAY) ×1 IMPLANT
PIN FIX HEX IDENTITY 2.7X100 (PIN) IMPLANT
PIN THREADED REVERSE (PIN) IMPLANT
RESTRAINT HEAD UNIVERSAL NS (MISCELLANEOUS) ×1 IMPLANT
SCREW BONE CORT 6.5X35MM (Screw) IMPLANT
SCREW BONE LOCKING 4.75X30X3.5 (Screw) IMPLANT
SCREW LOCKING 4.75MMX15MM (Screw) IMPLANT
SLING ARM FOAM STRAP LRG (SOFTGOODS) IMPLANT
SPIKE FLUID TRANSFER (MISCELLANEOUS) ×1 IMPLANT
STEM HUM IDENT STD SHLD 13 (Stem) IMPLANT
STRIP CLOSURE SKIN 1/2X4 (GAUZE/BANDAGES/DRESSINGS) ×1 IMPLANT
SUT MNCRL AB 4-0 PS2 18 (SUTURE) ×1 IMPLANT
SUT VIC AB 0 CT1 36 (SUTURE) ×1 IMPLANT
SUT VIC AB 0 CT2 27 (SUTURE) ×1 IMPLANT
SUT VIC AB 2-0 CT1 TAPERPNT 27 (SUTURE) ×1 IMPLANT
SUTURE FIBERWR #2 38 T-5 BLUE (SUTURE) ×2 IMPLANT
SUTURE FIBERWR#2 38 REV NDL BL (SUTURE) IMPLANT
TOWEL GREEN STERILE FF (TOWEL DISPOSABLE) ×1 IMPLANT
TOWEL OR 17X26 10 PK STRL BLUE (TOWEL DISPOSABLE) ×1 IMPLANT
YANKAUER SUCT BULB TIP NO VENT (SUCTIONS) ×1 IMPLANT

## 2024-04-09 NOTE — Evaluation (Signed)
 Occupational Therapy Evaluation Patient Details Name: Andrew Hardy. MRN: [id number] DOB: July 20, 1940 Today's Date: 04/09/2024   History of Present Illness   Mr. Danese is a 83 yr old male who is s/p a R reverse total shoulder arthroplasty on 04-09-24, due to end stage OA of shoulder and rotator cuff insufficiency.     Clinical Impressions Pt is s/p shoulder replacement of right dominant upper extremity on 04-09-24. Therapist provided education and instruction to patient and her spouse with regards to ROM/exercise protocol, post-op precautions, upper extremity and sling positioning, donning/doffing upper extremity clothing, recommendations for bathing while maintaining shoulder precautions, use of ice for pain and edema management, non-weight-bearing status, sling wear schedule, and correctly donning/doffing sling. Patient and spouse verbalized and demonstrated understanding as needed. Patient needed assistance to donn shirt, underwear, pants, and shoes, with instruction provided on compensatory strategies to perform ADLs. Patient to follow physician's recommendations for discharge and potential follow-up therapy needs.      If plan is discharge home, recommend the following:   Help with stairs or ramp for entrance;Assist for transportation;Assistance with cooking/housework;A little help with bathing/dressing/bathroom;A little help with walking and/or transfers     Functional Status Assessment   Patient has had a recent decline in their functional status and demonstrates the ability to make significant improvements in function in a reasonable and predictable amount of time.     Equipment Recommendations   None recommended by OT     Recommendations for Other Services         Precautions/Restrictions   Precautions Precautions: Shoulder Type of Shoulder Precautions: conservative protocol, okay to perform ADLs however no shoulder ROM, elbow, wrist and hand ROM to  tolerance Shoulder Interventions: Shoulder sling/immobilizer Precaution Booklet Issued: Yes (comment) Recall of Precautions/Restrictions: Intact Required Braces or Orthoses: Sling Restrictions Weight Bearing Restrictions Per Provider Order: Yes     Mobility Bed Mobility Overal bed mobility: Needs Assistance Bed Mobility: Supine to Sit     Supine to sit: Supervision          Transfers Overall transfer level: Needs assistance Equipment used: None Transfers: Sit to/from Stand Sit to Stand: Contact guard assist                  Balance Overall balance assessment: No apparent balance deficits (not formally assessed)         ADL either performed or assessed with clinical judgement   ADL Overall ADL's : Needs assistance/impaired                 Upper Body Dressing : Moderate assistance;Sitting;Cueing for compensatory techniques;Cueing for sequencing;Maximal assistance Upper Body Dressing Details (indicate cue type and reason): assist needed to donn around the back shirt and sling Lower Body Dressing: Moderate assistance;Sitting/lateral leans;Sit to/from stand Lower Body Dressing Details (indicate cue type and reason): assist needed to donn underwear, pants and shoes       Per orders pt and his spouse were instructed and educated on the following: no shoulder ROM, okay to perform elbow/wrist/hand ROM, bathing while adhering to ROM parameters, how to donn/doff shirt by placing operative arm through sleeve first when donning shirt and off last when doffing shirt, compensatory strategies for lower body ADLs,  strategies to reduce risk of falls,  correctly donning/doffing sling,  to wear the sling at all times with the exception of doing ADLs and exercises, to loosen the neck strap of the sling when the operative arm is in a supported position when sitting,  while in sitting or supine, having a pillow behind and under the operative arm to provide support, and if assist is  needed with ambulation the importance of walking on pt's non-operative side.        Vision Patient Visual Report: No change from baseline              Pertinent Vitals/Pain Pain Assessment Pain Assessment: 0-10 Pain Score: 3  Pain Location: R shoulder Pain Intervention(s): Limited activity within patient's tolerance, Monitored during session     Extremity/Trunk Assessment Upper Extremity Assessment Upper Extremity Assessment: Right hand dominant   Lower Extremity Assessment Lower Extremity Assessment: Overall WFL for tasks assessed       Communication Communication Communication: No apparent difficulties   Cognition Arousal: Alert Behavior During Therapy: WFL for tasks assessed/performed Cognition: No apparent impairments             OT - Cognition Comments: Oriented x4          Following commands: Intact       Cueing  General Comments   Cueing Techniques: Verbal cues         Shoulder Instructions Shoulder Instructions Donning/doffing shirt without moving shoulder: Moderate assistance Method for sponge bathing under operated UE:  (pt and spouse verbalized understanding) Donning/doffing sling/immobilizer: Maximal assistance Correct positioning of sling/immobilizer: Maximal assistance Pendulum exercises (written home exercise program):  (N/A) ROM for elbow, wrist and digits of operated UE:  (pt and spouse verbalized understanding) Sling wearing schedule (on at all times/off for ADL's):  (pt and spouse verbalized understanding) Proper positioning of operated UE when showering:  (pt and spouse verbalized understanding) Dressing change:  (pt and spouse verbalized understanding) Positioning of UE while sleeping:  (pt and spouse verbalized understanding)    Home Living Family/patient expects to be discharged to:: Private residence Living Arrangements: Spouse/significant other Available Help at Discharge: Family Type of Home: House Home Access: Stairs  to enter Secretary/administrator of Steps: 2   Home Layout: One level     Bathroom Shower/Tub: Walk-in shower         Home Equipment: Pharmacist, Hospital (2 wheels);Cane - single point          Prior Functioning/Environment Prior Level of Function : Driving;Independent/Modified Independent             Mobility Comments: Independent with ambulation. ADLs Comments: Independent with ADLs and driving.    OT Problem List: Decreased strength;Decreased range of motion;Impaired UE functional use;Pain        OT Goals(Current goals can be found in the care plan section)   Acute Rehab OT Goals OT Goal Formulation: All assessment and education complete, DC therapy   OT Frequency:   N/A       AM-PAC OT 6 Clicks Daily Activity     Outcome Measure Help from another person eating meals?: None Help from another person taking care of personal grooming?: None Help from another person toileting, which includes using toliet, bedpan, or urinal?: A Little Help from another person bathing (including washing, rinsing, drying)?: A Little Help from another person to put on and taking off regular upper body clothing?: A Lot Help from another person to put on and taking off regular lower body clothing?: A Lot 6 Click Score: 18   End of Session Equipment Utilized During Treatment: Other (comment) (sling) Nurse Communication: Other (comment) (shoulder education completed)  Activity Tolerance: Patient tolerated treatment well Patient left: in bed;with call bell/phone within reach;with family/visitor present  OT  Visit Diagnosis: Muscle weakness (generalized) (M62.81);Pain Pain - Right/Left: Right Pain - part of body: Shoulder                Time: 1548-1610 OT Time Calculation (min): 22 min Charges:  OT General Charges $OT Visit: 1 Visit OT Evaluation $OT Eval Moderate Complexity: 1 Mod    Delanna JINNY Lesches, OTR/L 04/09/2024, 6:19 PM

## 2024-04-09 NOTE — Anesthesia Procedure Notes (Signed)
 Anesthesia Regional Block: Interscalene brachial plexus block   Pre-Anesthetic Checklist: , timeout performed,  Correct Patient, Correct Site, Correct Laterality,  Correct Procedure, Correct Position, site marked,  Risks and benefits discussed,  Surgical consent,  Pre-op evaluation,  At surgeon's request and post-op pain management  Laterality: Right  Prep: chloraprep       Needles:  Injection technique: Single-shot  Needle Type: Echogenic Stimulator Needle     Needle Length: 9cm  Needle Gauge: 21     Additional Needles:   Procedures:,,,, ultrasound used (permanent image in chart),,    Narrative:  Start time: 04/09/2024 9:06 AM End time: 04/09/2024 9:11 AM Injection made incrementally with aspirations every 5 mL.  Performed by: Personally  Anesthesiologist: Peggye Delon Brunswick, MD  Additional Notes: Discussed risks and benefits of nerve block including, but not limited to, prolonged and/or permanent nerve injury involving sensory and/or motor function. Monitors were applied and a time-out was performed. The nerve and associated structures were visualized under ultrasound guidance. After negative aspiration, local anesthetic was slowly injected around the nerve. There was no evidence of high pressure during the procedure. There were no paresthesias. VSS remained stable and the patient tolerated the procedure well.

## 2024-04-09 NOTE — Discharge Instructions (Signed)
 Ice to the shoulder constantly.  Keep the incision covered and clean and dry for one week, then ok to get it wet in the shower. Change to the Aquacel bandage on Sunday and then ok to take a shower and get the bandage wet(its waterproof)   Do exercise as instructed several times per day.  DO NOT reach behind your back or push up out of a chair with the operative arm.  Use a sling while you are up and around for comfort, may remove while seated.  Keep pillow propped behind the operative elbow.  Follow up with Dr Kay in two weeks in the office, call [phone] for appt  Please call Dr Kay [phone] cell with any questions or concerns

## 2024-04-09 NOTE — Op Note (Signed)
 NAME: Andrew Hardy, [place] MEDICAL RECORD NO: [id number] ACCOUNT NO: [id number] DATE OF BIRTH: [birth year] FACILITY: [facility] LOCATION: [facility] PHYSICIAN: Elspeth SAUNDERS. 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  PROCEDURE PERFORMED:  Right reverse total shoulder arthroplasty using Biomet identity prosthesis.  ATTENDING SURGEON:  Elspeth SAUNDERS. Kay, MD  ASSISTANT:  Debby Crock Dixon, [place], who was scrubbed during the entire procedure, and necessary for satisfactory completion of surgery.  ANESTHESIA:  General anesthesia plus interscalene block was utilized.  ESTIMATED BLOOD LOSS:  50 mL.  FLUID REPLACEMENT:  1000 mL crystalloid.  COUNTS:  Instrument count correct.  COMPLICATIONS:  No complications.  ANTIBIOTICS:  Perioperative antibiotics were given.  INDICATIONS:  Patient is an 83 year old male with long-standing right shoulder pain with a history of prior right shoulder surgery.  The patient has had rotator cuff surgery and has had a failure of his rotator cuff with rotator cuff insufficiency at  this point.  Patient also has advanced arthritis with large osteophytes.  Having failed conservative management, he desires reverse total shoulder arthroplasty to eliminate pain and restore function.  Informed consent obtained.  DESCRIPTION OF PROCEDURE:  After an adequate level of anesthesia was achieved, the patient positioned in the modified beach chair position.  Right shoulder correctly identified.  Sterile prep and drape performed.  Timeout called verifying correct patient  and correct site.  We entered the shoulder using a standard deltopectoral incision starting at the coracoid process and extending down the anterior humerus with a 10 blade scalpel dissection down through subcutaneous  tissues using Bovie.  Cephalic vein  identified and taken lateral with the deltoid.  Pectoralis taken medially.  Conjoined tendon identified and retracted medially.  Deep retractors are placed.  Patient's biceps have been previously tenodesed.  We released the subscapularis remnant, which  there was just a little bit on the bottom part, and we tagged it for protection of the axillary nerve.  We released the inferior capsule extending the shoulder and delivering the humeral head out of the wound.  There were advanced osteophytes and  full-thickness cartilage loss noted throughout the humeral head.  We entered the proximal humerus with a starting reamer, reaming up to a size 13.  We then went ahead and cut the head at 30 degrees of retroversion with the oscillating saw with the  cutting guide.  We removed excess osteophytes with the rongeur.  We then subluxed the humerus posteriorly.  We had good exposure of the glenoid.  We removed the capsule and the labrum and then noted there to be full-thickness cartilage loss on the  glenoid side as well.  We placed our guide pin for our glenoid reamer.  We then reamed to subchondral bone with good bone preparation.  We then impacted the mini baseplate for the comprehensive Biomet reverse shoulder into position.  We then placed a 35  central screw bicortical with good purchase.  We then placed a 30 screw inferiorly and superiorly and a 15 screw anteriorly.  With the screws in place and the baseplate secure, we selected the 36 plus zero standard glenosphere and impacted that on the  baseplate.  We had it on the B setting and dialed the eccentricity inferiorly.  With the glenosphere on the baseplate, we impacted that and did the secondary impactor and the glenosphere was secure.  I did  a finger sweep to make sure we had no soft  tissue caught up between the two parts and then we went to the humeral side.  We broached starting at the 11 and then broached up to a size 13 for  the identity stem.  With that stem in place, we selected the +6 extra extended tray and the standard poly.   We trialled with that.  We were happy with our soft tissue tension, nice little pop as it reduced, and appropriate soft tissue balance and tension with good stability.  We removed the components from the humeral side.  We irrigated thoroughly.  We  placed some vancomycin powder inside the humeral shaft.  We then used available bone graft from the humeral head in an impaction grafting technique and impacted the porous-coated identity stem size 13 into position with the +6 extra extended tray.  We  then selected the real standard bearing for the 36 ball and placed that on the humeral tray and snapped into position.  We impacted it and we then reduced the shoulder.  Nice little pop as it reduced, appropriate tension on the conjoined, good stability  throughout, and no impingement.  We irrigated thoroughly.  We then went ahead and repaired deltopectoral interval with 0 Vicryl suture after placing the remainder of the vancomycin powder deep into the wound.  We then did 2-0 Vicryl for subcutaneous  closure and 4-0 Monocryl for skin.  Steri-Strips applied, followed by a sterile dressing.  Patient tolerated surgery well.   PUS D: 04/09/2024 1:33:29 pm T: 04/09/2024 1:59:00 pm  JOB: [id number]/ [id number]

## 2024-04-09 NOTE — Care Plan (Signed)
 Ortho Bundle Case Management Note  Patient Details  Name: Andrew Hardy. MRN: [id number] Date of Birth: Oct 24, 1940  R Rev TSA on 04/09/24  DCP: Home with wife  DME: No needs  PT: HEP                   DME Arranged:  N/A DME Agency:  NA  HH Arranged:    HH Agency:     Additional Comments: Please contact me with any questions of if this plan should need to change.  Lyle Pepper, CCM [facility] [phone]  Ext. [phone]   04/09/2024, 8:57 AM

## 2024-04-09 NOTE — Interval H&P Note (Signed)
 History and Physical Interval Note:  04/09/2024 8:57 AM  Andrew Hardy.  has presented today for surgery, with the diagnosis of Right rotator cuff tear arthropathy.  The various methods of treatment have been discussed with the patient and family. After consideration of risks, benefits and other options for treatment, the patient has consented to  Procedures: ARTHROPLASTY, SHOULDER, TOTAL, REVERSE (Right) as a surgical intervention.  The patient's history has been reviewed, patient examined, no change in status, stable for surgery.  I have reviewed the patient's chart and labs.  Questions were answered to the patient's satisfaction.     Andrew Hardy

## 2024-04-09 NOTE — Transfer of Care (Signed)
 Immediate Anesthesia Transfer of Care Note  Patient: Andrew Hardy.  Procedure(s) Performed: ARTHROPLASTY, SHOULDER, TOTAL, REVERSE (Right: Shoulder)  Patient Location: PACU  Anesthesia Type:General  Level of Consciousness: oriented, drowsy, and patient cooperative  Airway & Oxygen  Therapy: Patient Spontanous Breathing and Patient connected to face mask oxygen   Post-op Assessment: Report given to RN and Post -op Vital signs reviewed and stable  Post vital signs: Reviewed and stable  Last Vitals:  Vitals Value Taken Time  BP 130/55 04/09/24 13:15  Temp    Pulse 64 04/09/24 13:14  Resp 19 04/09/24 13:15  SpO2 100 % 04/09/24 13:14  Vitals shown include unfiled device data.  Last Pain:  Vitals:   04/09/24 0910  TempSrc:   PainSc: 0-No pain         Complications: No notable events documented.

## 2024-04-09 NOTE — Anesthesia Postprocedure Evaluation (Signed)
"   Anesthesia Post Note  Patient: Andrew D Wittmann Jr.  Procedure(s) Performed: ARTHROPLASTY, SHOULDER, TOTAL, REVERSE (Right: Shoulder)     Patient location during evaluation: PACU Anesthesia Type: General Level of consciousness: awake Pain management: pain level controlled Vital Signs Assessment: post-procedure vital signs reviewed and stable Respiratory status: spontaneous breathing, nonlabored ventilation and respiratory function stable Cardiovascular status: blood pressure returned to baseline and stable Postop Assessment: no apparent nausea or vomiting Anesthetic complications: no   No notable events documented.  Last Vitals:  Vitals:   04/09/24 1430 04/09/24 1445  BP: (!) 114/58 123/61  Pulse: 62 64  Resp: 15 19  Temp:    SpO2: 91% 94%    Last Pain:  Vitals:   04/09/24 1445  TempSrc:   PainSc: 3                  [name]      "

## 2024-04-09 NOTE — Brief Op Note (Signed)
 04/09/2024  1:25 PM  PATIENT:  Andrew Hardy.  83 y.o. male  PRE-OPERATIVE DIAGNOSIS:  Right rotator cuff tear arthropathy  POST-OPERATIVE DIAGNOSIS:  Right rotator cuff tear arthropathy  PROCEDURE:  Procedures: ARTHROPLASTY, SHOULDER, TOTAL, REVERSE (Right) Biomet Identity   SURGEON:  Surgeons and Role:    DEWAINE Kay Kemps, MD - Primary  PHYSICIAN ASSISTANT:   ASSISTANTS: Debby KATHEE Fireman, PA-C   ANESTHESIA:   regional and general  EBL:  50 mL   BLOOD ADMINISTERED:none  DRAINS: none   LOCAL MEDICATIONS USED:  MARCAINE      SPECIMEN:  No Specimen  DISPOSITION OF SPECIMEN:  N/A  COUNTS:  YES  TOURNIQUET:  * No tourniquets in log *  DICTATION: .Other Dictation: Dictation Number [id number]  PLAN OF CARE: Discharge to home after PACU  PATIENT DISPOSITION:  PACU - hemodynamically stable.   Delay start of Pharmacological VTE agent (>24hrs) due to surgical blood loss or risk of bleeding: not applicable

## 2024-04-09 NOTE — Anesthesia Procedure Notes (Signed)
 Procedure Name: Intubation Date/Time: 04/09/2024 11:28 AM  Performed by: Augusta Daved SAILOR, CRNAPre-anesthesia Checklist: Patient identified, Emergency Drugs available, Suction available and Patient being monitored Patient Re-evaluated:Patient Re-evaluated prior to induction Oxygen  Delivery Method: Circle System Utilized Preoxygenation: Pre-oxygenation with 100% oxygen  Induction Type: IV induction Ventilation: Mask ventilation without difficulty and Oral airway inserted - appropriate to patient size Laryngoscope Size: Glidescope and 3 Grade View: Grade I Tube type: Oral Tube size: 7.5 mm Number of attempts: 1 Airway Equipment and Method: Stylet and Oral airway Placement Confirmation: ETT inserted through vocal cords under direct vision, positive ETCO2 and breath sounds checked- equal and bilateral Secured at: 22 (at the lip) cm Tube secured with: Tape Dental Injury: Teeth and Oropharynx as per pre-operative assessment

## 2024-04-14 ENCOUNTER — Encounter (HOSPITAL_COMMUNITY): Payer: Self-pay | Admitting: Orthopedic Surgery
